# Patient Record
Sex: Female | Born: 1960
Health system: Southern US, Community
[De-identification: ages and names within clinical notes are randomized; demographics above are authoritative.]

## PROBLEM LIST (undated history)

## (undated) DIAGNOSIS — E785 Hyperlipidemia, unspecified: Secondary | ICD-10-CM

## (undated) DIAGNOSIS — E039 Hypothyroidism, unspecified: Secondary | ICD-10-CM

## (undated) DIAGNOSIS — R011 Cardiac murmur, unspecified: Secondary | ICD-10-CM

## (undated) DIAGNOSIS — M810 Age-related osteoporosis without current pathological fracture: Secondary | ICD-10-CM

## (undated) DIAGNOSIS — C50919 Malignant neoplasm of unspecified site of unspecified female breast: Secondary | ICD-10-CM

## (undated) DIAGNOSIS — Z1379 Encounter for other screening for genetic and chromosomal anomalies: Principal | ICD-10-CM

## (undated) DIAGNOSIS — Z923 Personal history of irradiation: Secondary | ICD-10-CM

## (undated) DIAGNOSIS — Z9221 Personal history of antineoplastic chemotherapy: Secondary | ICD-10-CM

## (undated) DIAGNOSIS — E042 Nontoxic multinodular goiter: Secondary | ICD-10-CM

## (undated) DIAGNOSIS — K219 Gastro-esophageal reflux disease without esophagitis: Secondary | ICD-10-CM

## (undated) DIAGNOSIS — Z803 Family history of malignant neoplasm of breast: Secondary | ICD-10-CM

## (undated) DIAGNOSIS — Z9289 Personal history of other medical treatment: Secondary | ICD-10-CM

## (undated) HISTORY — DX: Hypothyroidism, unspecified: E03.9

## (undated) HISTORY — PX: ABDOMINAL HYSTERECTOMY: SHX81

## (undated) HISTORY — DX: Hyperlipidemia, unspecified: E78.5

## (undated) HISTORY — DX: Personal history of other medical treatment: Z92.89

## (undated) HISTORY — DX: Cardiac murmur, unspecified: R01.1

## (undated) HISTORY — DX: Nontoxic multinodular goiter: E04.2

## (undated) HISTORY — DX: Encounter for other screening for genetic and chromosomal anomalies: Z13.79

## (undated) HISTORY — DX: Family history of malignant neoplasm of breast: Z80.3

## (undated) HISTORY — PX: BREAST SURGERY: SHX581

## (undated) HISTORY — PX: TOTAL ABDOMINAL HYSTERECTOMY W/ BILATERAL SALPINGOOPHORECTOMY: SHX83

## (undated) HISTORY — DX: Malignant neoplasm of unspecified site of unspecified female breast: C50.919

---

## 1997-08-10 ENCOUNTER — Other Ambulatory Visit: Admission: RE | Admit: 1997-08-10 | Discharge: 1997-08-10 | Payer: Self-pay | Admitting: Obstetrics and Gynecology

## 1997-11-27 ENCOUNTER — Ambulatory Visit: Admission: RE | Admit: 1997-11-27 | Discharge: 1997-11-27 | Payer: Self-pay | Admitting: Obstetrics and Gynecology

## 1997-11-27 ENCOUNTER — Encounter: Payer: Self-pay | Admitting: Obstetrics and Gynecology

## 1998-09-18 ENCOUNTER — Other Ambulatory Visit: Admission: RE | Admit: 1998-09-18 | Discharge: 1998-09-18 | Payer: Self-pay | Admitting: Obstetrics and Gynecology

## 1999-10-08 ENCOUNTER — Other Ambulatory Visit: Admission: RE | Admit: 1999-10-08 | Discharge: 1999-10-08 | Payer: Self-pay | Admitting: Obstetrics and Gynecology

## 2000-08-17 ENCOUNTER — Emergency Department (HOSPITAL_COMMUNITY): Admission: EM | Admit: 2000-08-17 | Discharge: 2000-08-17 | Payer: Self-pay

## 2001-02-03 HISTORY — PX: BREAST LUMPECTOMY: SHX2

## 2001-05-03 ENCOUNTER — Other Ambulatory Visit: Admission: RE | Admit: 2001-05-03 | Discharge: 2001-05-03 | Payer: Self-pay | Admitting: Radiology

## 2001-05-03 ENCOUNTER — Encounter: Payer: Self-pay | Admitting: Obstetrics and Gynecology

## 2001-05-03 ENCOUNTER — Encounter (INDEPENDENT_AMBULATORY_CARE_PROVIDER_SITE_OTHER): Payer: Self-pay | Admitting: Specialist

## 2001-05-03 ENCOUNTER — Encounter: Admission: RE | Admit: 2001-05-03 | Discharge: 2001-05-03 | Payer: Self-pay | Admitting: Obstetrics and Gynecology

## 2001-05-17 ENCOUNTER — Encounter (INDEPENDENT_AMBULATORY_CARE_PROVIDER_SITE_OTHER): Payer: Self-pay | Admitting: *Deleted

## 2001-05-17 ENCOUNTER — Ambulatory Visit (HOSPITAL_BASED_OUTPATIENT_CLINIC_OR_DEPARTMENT_OTHER): Admission: RE | Admit: 2001-05-17 | Discharge: 2001-05-17 | Payer: Self-pay | Admitting: *Deleted

## 2001-05-17 ENCOUNTER — Encounter: Admission: RE | Admit: 2001-05-17 | Discharge: 2001-05-17 | Payer: Self-pay | Admitting: *Deleted

## 2001-05-22 ENCOUNTER — Emergency Department (HOSPITAL_COMMUNITY): Admission: EM | Admit: 2001-05-22 | Discharge: 2001-05-22 | Payer: Self-pay | Admitting: Emergency Medicine

## 2001-06-01 ENCOUNTER — Ambulatory Visit (HOSPITAL_COMMUNITY): Admission: RE | Admit: 2001-06-01 | Discharge: 2001-06-01 | Payer: Self-pay | Admitting: Oncology

## 2001-06-01 ENCOUNTER — Encounter: Payer: Self-pay | Admitting: Oncology

## 2001-06-04 ENCOUNTER — Ambulatory Visit (HOSPITAL_COMMUNITY): Admission: RE | Admit: 2001-06-04 | Discharge: 2001-06-04 | Payer: Self-pay | Admitting: Oncology

## 2001-06-04 ENCOUNTER — Encounter: Payer: Self-pay | Admitting: Oncology

## 2001-06-08 ENCOUNTER — Ambulatory Visit (HOSPITAL_BASED_OUTPATIENT_CLINIC_OR_DEPARTMENT_OTHER): Admission: RE | Admit: 2001-06-08 | Discharge: 2001-06-08 | Payer: Self-pay | Admitting: *Deleted

## 2001-06-09 ENCOUNTER — Encounter: Admission: RE | Admit: 2001-06-09 | Discharge: 2001-06-09 | Payer: Self-pay | Admitting: *Deleted

## 2001-06-09 ENCOUNTER — Inpatient Hospital Stay (HOSPITAL_COMMUNITY): Admission: EM | Admit: 2001-06-09 | Discharge: 2001-06-11 | Payer: Self-pay | Admitting: Emergency Medicine

## 2001-10-14 ENCOUNTER — Ambulatory Visit: Admission: RE | Admit: 2001-10-14 | Discharge: 2001-12-27 | Payer: Self-pay | Admitting: *Deleted

## 2001-10-28 ENCOUNTER — Encounter: Admission: RE | Admit: 2001-10-28 | Discharge: 2001-10-28 | Payer: Self-pay | Admitting: *Deleted

## 2001-11-15 ENCOUNTER — Ambulatory Visit (HOSPITAL_COMMUNITY): Admission: RE | Admit: 2001-11-15 | Discharge: 2001-11-15 | Payer: Self-pay | Admitting: Neurosurgery

## 2001-11-15 ENCOUNTER — Encounter: Payer: Self-pay | Admitting: Oncology

## 2001-12-01 ENCOUNTER — Other Ambulatory Visit: Admission: RE | Admit: 2001-12-01 | Discharge: 2001-12-01 | Payer: Self-pay | Admitting: Obstetrics and Gynecology

## 2002-01-14 ENCOUNTER — Ambulatory Visit (HOSPITAL_BASED_OUTPATIENT_CLINIC_OR_DEPARTMENT_OTHER): Admission: RE | Admit: 2002-01-14 | Discharge: 2002-01-14 | Payer: Self-pay | Admitting: *Deleted

## 2002-04-06 ENCOUNTER — Encounter: Payer: Self-pay | Admitting: Oncology

## 2002-04-06 ENCOUNTER — Ambulatory Visit (HOSPITAL_COMMUNITY): Admission: RE | Admit: 2002-04-06 | Discharge: 2002-04-06 | Payer: Self-pay | Admitting: Oncology

## 2002-07-13 ENCOUNTER — Encounter: Admission: RE | Admit: 2002-07-13 | Discharge: 2002-07-13 | Payer: Self-pay | Admitting: Oncology

## 2002-07-13 ENCOUNTER — Encounter: Payer: Self-pay | Admitting: Oncology

## 2002-12-12 ENCOUNTER — Other Ambulatory Visit: Admission: RE | Admit: 2002-12-12 | Discharge: 2002-12-12 | Payer: Self-pay | Admitting: Obstetrics and Gynecology

## 2003-01-09 ENCOUNTER — Ambulatory Visit (HOSPITAL_COMMUNITY): Admission: RE | Admit: 2003-01-09 | Discharge: 2003-01-09 | Payer: Self-pay | Admitting: Oncology

## 2003-02-23 ENCOUNTER — Encounter: Admission: RE | Admit: 2003-02-23 | Discharge: 2003-02-23 | Payer: Self-pay | Admitting: *Deleted

## 2003-07-31 ENCOUNTER — Encounter: Admission: RE | Admit: 2003-07-31 | Discharge: 2003-07-31 | Payer: Self-pay | Admitting: Oncology

## 2003-10-18 ENCOUNTER — Ambulatory Visit (HOSPITAL_COMMUNITY): Admission: RE | Admit: 2003-10-18 | Discharge: 2003-10-18 | Payer: Self-pay | Admitting: Oncology

## 2003-11-14 ENCOUNTER — Encounter: Admission: RE | Admit: 2003-11-14 | Discharge: 2003-11-14 | Payer: Self-pay | Admitting: Oncology

## 2003-12-09 ENCOUNTER — Ambulatory Visit: Payer: Self-pay | Admitting: Oncology

## 2004-01-08 ENCOUNTER — Other Ambulatory Visit: Admission: RE | Admit: 2004-01-08 | Discharge: 2004-01-08 | Payer: Self-pay | Admitting: Obstetrics and Gynecology

## 2004-02-12 ENCOUNTER — Observation Stay (HOSPITAL_COMMUNITY): Admission: EM | Admit: 2004-02-12 | Discharge: 2004-02-13 | Payer: Self-pay | Admitting: Obstetrics and Gynecology

## 2004-02-12 ENCOUNTER — Encounter (INDEPENDENT_AMBULATORY_CARE_PROVIDER_SITE_OTHER): Payer: Self-pay | Admitting: Specialist

## 2004-04-02 ENCOUNTER — Ambulatory Visit: Payer: Self-pay | Admitting: Oncology

## 2004-07-30 ENCOUNTER — Ambulatory Visit: Payer: Self-pay | Admitting: Oncology

## 2004-08-01 ENCOUNTER — Encounter: Admission: RE | Admit: 2004-08-01 | Discharge: 2004-08-01 | Payer: Self-pay | Admitting: Oncology

## 2004-08-14 ENCOUNTER — Encounter: Admission: RE | Admit: 2004-08-14 | Discharge: 2004-08-14 | Payer: Self-pay | Admitting: Oncology

## 2004-11-27 ENCOUNTER — Ambulatory Visit: Payer: Self-pay | Admitting: Oncology

## 2005-02-18 ENCOUNTER — Other Ambulatory Visit: Admission: RE | Admit: 2005-02-18 | Discharge: 2005-02-18 | Payer: Self-pay | Admitting: Obstetrics and Gynecology

## 2005-04-21 ENCOUNTER — Emergency Department (HOSPITAL_COMMUNITY): Admission: EM | Admit: 2005-04-21 | Discharge: 2005-04-21 | Payer: Self-pay | Admitting: Emergency Medicine

## 2005-05-27 ENCOUNTER — Ambulatory Visit (HOSPITAL_COMMUNITY): Admission: RE | Admit: 2005-05-27 | Discharge: 2005-05-27 | Payer: Self-pay | Admitting: Oncology

## 2005-05-30 ENCOUNTER — Ambulatory Visit: Payer: Self-pay | Admitting: Oncology

## 2005-06-02 LAB — CBC WITH DIFFERENTIAL/PLATELET
Basophils Absolute: 0 10*3/uL (ref 0.0–0.1)
EOS%: 1.1 % (ref 0.0–7.0)
Eosinophils Absolute: 0.1 10*3/uL (ref 0.0–0.5)
HCT: 43.6 % (ref 34.8–46.6)
HGB: 15 g/dL (ref 11.6–15.9)
MONO#: 0.4 10*3/uL (ref 0.1–0.9)
NEUT#: 3.1 10*3/uL (ref 1.5–6.5)
NEUT%: 61 % (ref 39.6–76.8)
RDW: 13.3 % (ref 11.3–14.5)
WBC: 5.1 10*3/uL (ref 3.9–10.0)
lymph#: 1.5 10*3/uL (ref 0.9–3.3)

## 2005-06-02 LAB — CANCER ANTIGEN 27.29: CA 27.29: 23 U/mL (ref 0–39)

## 2005-06-02 LAB — COMPREHENSIVE METABOLIC PANEL
ALT: 18 U/L (ref 0–40)
AST: 21 U/L (ref 0–37)
Albumin: 5 g/dL (ref 3.5–5.2)
BUN: 15 mg/dL (ref 6–23)
CO2: 27 mEq/L (ref 19–32)
Calcium: 9.9 mg/dL (ref 8.4–10.5)
Chloride: 103 mEq/L (ref 96–112)
Creatinine, Ser: 0.9 mg/dL (ref 0.4–1.2)
Potassium: 4.8 mEq/L (ref 3.5–5.3)

## 2005-06-02 LAB — LACTATE DEHYDROGENASE: LDH: 185 U/L (ref 94–250)

## 2005-06-09 ENCOUNTER — Ambulatory Visit (HOSPITAL_COMMUNITY): Admission: RE | Admit: 2005-06-09 | Discharge: 2005-06-09 | Payer: Self-pay | Admitting: Oncology

## 2005-08-11 ENCOUNTER — Encounter: Admission: RE | Admit: 2005-08-11 | Discharge: 2005-08-11 | Payer: Self-pay | Admitting: Oncology

## 2005-12-02 ENCOUNTER — Ambulatory Visit: Payer: Self-pay | Admitting: Oncology

## 2005-12-04 ENCOUNTER — Encounter: Admission: RE | Admit: 2005-12-04 | Discharge: 2005-12-04 | Payer: Self-pay | Admitting: Oncology

## 2005-12-04 LAB — COMPREHENSIVE METABOLIC PANEL
Alkaline Phosphatase: 72 U/L (ref 39–117)
BUN: 17 mg/dL (ref 6–23)
CO2: 26 mEq/L (ref 19–32)
Creatinine, Ser: 0.84 mg/dL (ref 0.40–1.20)
Glucose, Bld: 96 mg/dL (ref 70–99)
Sodium: 140 mEq/L (ref 135–145)
Total Bilirubin: 0.6 mg/dL (ref 0.3–1.2)
Total Protein: 7.1 g/dL (ref 6.0–8.3)

## 2005-12-04 LAB — CBC WITH DIFFERENTIAL/PLATELET
Basophils Absolute: 0 10*3/uL (ref 0.0–0.1)
Eosinophils Absolute: 0.1 10*3/uL (ref 0.0–0.5)
HCT: 42.2 % (ref 34.8–46.6)
LYMPH%: 36.9 % (ref 14.0–48.0)
MCV: 85.7 fL (ref 81.0–101.0)
MONO#: 0.3 10*3/uL (ref 0.1–0.9)
MONO%: 8.5 % (ref 0.0–13.0)
NEUT#: 2.1 10*3/uL (ref 1.5–6.5)
NEUT%: 52.8 % (ref 39.6–76.8)
Platelets: 273 10*3/uL (ref 145–400)
WBC: 4 10*3/uL (ref 3.9–10.0)

## 2005-12-04 LAB — CANCER ANTIGEN 27.29: CA 27.29: 22 U/mL (ref 0–39)

## 2005-12-04 LAB — LACTATE DEHYDROGENASE: LDH: 178 U/L (ref 94–250)

## 2006-06-23 ENCOUNTER — Ambulatory Visit: Payer: Self-pay | Admitting: Oncology

## 2006-06-23 ENCOUNTER — Ambulatory Visit (HOSPITAL_COMMUNITY): Admission: RE | Admit: 2006-06-23 | Discharge: 2006-06-23 | Payer: Self-pay | Admitting: Oncology

## 2006-06-25 LAB — CBC WITH DIFFERENTIAL/PLATELET
BASO%: 0.5 % (ref 0.0–2.0)
Basophils Absolute: 0 10*3/uL (ref 0.0–0.1)
EOS%: 0.9 % (ref 0.0–7.0)
HGB: 14.7 g/dL (ref 11.6–15.9)
MCH: 30 pg (ref 26.0–34.0)
MCHC: 35.3 g/dL (ref 32.0–36.0)
MCV: 84.9 fL (ref 81.0–101.0)
MONO%: 7 % (ref 0.0–13.0)
RBC: 4.89 10*6/uL (ref 3.70–5.32)
RDW: 13.2 % (ref 11.3–14.5)
lymph#: 1.4 10*3/uL (ref 0.9–3.3)

## 2006-06-25 LAB — COMPREHENSIVE METABOLIC PANEL
ALT: 16 U/L (ref 0–35)
AST: 24 U/L (ref 0–37)
Albumin: 5.2 g/dL (ref 3.5–5.2)
Alkaline Phosphatase: 75 U/L (ref 39–117)
BUN: 21 mg/dL (ref 6–23)
Calcium: 9.6 mg/dL (ref 8.4–10.5)
Chloride: 104 mEq/L (ref 96–112)
Potassium: 4.1 mEq/L (ref 3.5–5.3)
Sodium: 139 mEq/L (ref 135–145)

## 2006-08-14 ENCOUNTER — Encounter: Admission: RE | Admit: 2006-08-14 | Discharge: 2006-08-14 | Payer: Self-pay | Admitting: Oncology

## 2006-08-17 ENCOUNTER — Ambulatory Visit (HOSPITAL_COMMUNITY): Admission: RE | Admit: 2006-08-17 | Discharge: 2006-08-17 | Payer: Self-pay | Admitting: Oncology

## 2006-11-17 ENCOUNTER — Ambulatory Visit: Payer: Self-pay | Admitting: Sports Medicine

## 2006-11-17 DIAGNOSIS — Z853 Personal history of malignant neoplasm of breast: Secondary | ICD-10-CM | POA: Insufficient documentation

## 2006-11-17 DIAGNOSIS — M2170 Unequal limb length (acquired), unspecified site: Secondary | ICD-10-CM

## 2006-11-17 DIAGNOSIS — M2040 Other hammer toe(s) (acquired), unspecified foot: Secondary | ICD-10-CM | POA: Insufficient documentation

## 2006-11-17 DIAGNOSIS — M84369A Stress fracture, unspecified tibia and fibula, initial encounter for fracture: Secondary | ICD-10-CM | POA: Insufficient documentation

## 2006-11-17 DIAGNOSIS — M25579 Pain in unspecified ankle and joints of unspecified foot: Secondary | ICD-10-CM | POA: Insufficient documentation

## 2006-11-17 DIAGNOSIS — E039 Hypothyroidism, unspecified: Secondary | ICD-10-CM | POA: Insufficient documentation

## 2006-11-17 HISTORY — DX: Unequal limb length (acquired), unspecified site: M21.70

## 2006-12-01 ENCOUNTER — Ambulatory Visit: Payer: Self-pay | Admitting: Sports Medicine

## 2006-12-01 DIAGNOSIS — M79609 Pain in unspecified limb: Secondary | ICD-10-CM | POA: Insufficient documentation

## 2007-02-02 ENCOUNTER — Ambulatory Visit: Payer: Self-pay | Admitting: Oncology

## 2007-02-04 DIAGNOSIS — E042 Nontoxic multinodular goiter: Secondary | ICD-10-CM

## 2007-02-04 HISTORY — DX: Nontoxic multinodular goiter: E04.2

## 2007-02-05 LAB — CBC WITH DIFFERENTIAL/PLATELET
BASO%: 0.2 % (ref 0.0–2.0)
Basophils Absolute: 0 10*3/uL (ref 0.0–0.1)
Eosinophils Absolute: 0.1 10*3/uL (ref 0.0–0.5)
HCT: 42.7 % (ref 34.8–46.6)
HGB: 14.9 g/dL (ref 11.6–15.9)
LYMPH%: 38.4 % (ref 14.0–48.0)
MONO#: 0.3 10*3/uL (ref 0.1–0.9)
NEUT#: 3 10*3/uL (ref 1.5–6.5)
NEUT%: 55.4 % (ref 39.6–76.8)
Platelets: 283 10*3/uL (ref 145–400)
WBC: 5.5 10*3/uL (ref 3.9–10.0)
lymph#: 2.1 10*3/uL (ref 0.9–3.3)

## 2007-02-05 LAB — COMPREHENSIVE METABOLIC PANEL
ALT: 22 U/L (ref 0–35)
BUN: 15 mg/dL (ref 6–23)
CO2: 25 mEq/L (ref 19–32)
Calcium: 9.9 mg/dL (ref 8.4–10.5)
Chloride: 104 mEq/L (ref 96–112)
Creatinine, Ser: 1.13 mg/dL (ref 0.40–1.20)
Glucose, Bld: 63 mg/dL — ABNORMAL LOW (ref 70–99)
Total Bilirubin: 0.6 mg/dL (ref 0.3–1.2)

## 2007-02-05 LAB — LACTATE DEHYDROGENASE: LDH: 212 U/L (ref 94–250)

## 2007-02-05 LAB — CANCER ANTIGEN 27.29: CA 27.29: 21 U/mL (ref 0–39)

## 2007-02-09 LAB — VITAMIN D PNL(25-HYDRXY+1,25-DIHY)-BLD: Vit D, 25-Hydroxy: 36 ng/mL (ref 30–89)

## 2007-08-02 ENCOUNTER — Ambulatory Visit: Payer: Self-pay | Admitting: Oncology

## 2007-08-04 LAB — CBC WITH DIFFERENTIAL/PLATELET
BASO%: 0.5 % (ref 0.0–2.0)
EOS%: 1.2 % (ref 0.0–7.0)
LYMPH%: 30.8 % (ref 14.0–48.0)
MCH: 29.6 pg (ref 26.0–34.0)
MCHC: 35.1 g/dL (ref 32.0–36.0)
MONO#: 0.3 10*3/uL (ref 0.1–0.9)
NEUT%: 62.4 % (ref 39.6–76.8)
RBC: 4.85 10*6/uL (ref 3.70–5.32)
WBC: 6.3 10*3/uL (ref 3.9–10.0)
lymph#: 1.9 10*3/uL (ref 0.9–3.3)

## 2007-08-05 LAB — COMPREHENSIVE METABOLIC PANEL
ALT: 21 U/L (ref 0–35)
AST: 27 U/L (ref 0–37)
CO2: 25 mEq/L (ref 19–32)
Chloride: 103 mEq/L (ref 96–112)
Creatinine, Ser: 0.87 mg/dL (ref 0.40–1.20)
Sodium: 140 mEq/L (ref 135–145)
Total Bilirubin: 0.5 mg/dL (ref 0.3–1.2)
Total Protein: 7.4 g/dL (ref 6.0–8.3)

## 2007-08-05 LAB — LACTATE DEHYDROGENASE: LDH: 222 U/L (ref 94–250)

## 2007-08-17 ENCOUNTER — Encounter: Admission: RE | Admit: 2007-08-17 | Discharge: 2007-08-17 | Payer: Self-pay | Admitting: Oncology

## 2008-01-04 ENCOUNTER — Encounter: Admission: RE | Admit: 2008-01-04 | Discharge: 2008-01-04 | Payer: Self-pay | Admitting: Oncology

## 2008-06-14 ENCOUNTER — Ambulatory Visit: Payer: Self-pay | Admitting: Sports Medicine

## 2008-06-14 DIAGNOSIS — M84376A Stress fracture, unspecified foot, initial encounter for fracture: Secondary | ICD-10-CM | POA: Insufficient documentation

## 2008-07-05 ENCOUNTER — Ambulatory Visit: Payer: Self-pay | Admitting: Sports Medicine

## 2008-07-19 ENCOUNTER — Ambulatory Visit: Payer: Self-pay | Admitting: Sports Medicine

## 2008-08-16 ENCOUNTER — Ambulatory Visit: Payer: Self-pay | Admitting: Oncology

## 2008-08-18 LAB — CBC WITH DIFFERENTIAL/PLATELET
BASO%: 0.5 % (ref 0.0–2.0)
EOS%: 1.1 % (ref 0.0–7.0)
Eosinophils Absolute: 0.1 10*3/uL (ref 0.0–0.5)
LYMPH%: 38 % (ref 14.0–49.7)
MCH: 30.2 pg (ref 25.1–34.0)
MCHC: 34.5 g/dL (ref 31.5–36.0)
MCV: 87.3 fL (ref 79.5–101.0)
MONO%: 7.2 % (ref 0.0–14.0)
NEUT#: 2.5 10*3/uL (ref 1.5–6.5)
Platelets: 247 10*3/uL (ref 145–400)
RBC: 4.71 10*6/uL (ref 3.70–5.45)
RDW: 13.6 % (ref 11.2–14.5)

## 2008-08-21 ENCOUNTER — Encounter: Admission: RE | Admit: 2008-08-21 | Discharge: 2008-08-21 | Payer: Self-pay | Admitting: Oncology

## 2008-08-21 LAB — COMPREHENSIVE METABOLIC PANEL
ALT: 25 U/L (ref 0–35)
AST: 24 U/L (ref 0–37)
Albumin: 4.9 g/dL (ref 3.5–5.2)
Alkaline Phosphatase: 54 U/L (ref 39–117)
Potassium: 4 mEq/L (ref 3.5–5.3)
Sodium: 141 mEq/L (ref 135–145)
Total Bilirubin: 0.5 mg/dL (ref 0.3–1.2)
Total Protein: 7 g/dL (ref 6.0–8.3)

## 2008-08-21 LAB — CANCER ANTIGEN 27.29: CA 27.29: 22 U/mL (ref 0–39)

## 2009-08-20 ENCOUNTER — Ambulatory Visit: Payer: Self-pay | Admitting: Oncology

## 2009-08-20 LAB — CBC WITH DIFFERENTIAL/PLATELET
BASO%: 0.3 % (ref 0.0–2.0)
Basophils Absolute: 0 10*3/uL (ref 0.0–0.1)
EOS%: 0.9 % (ref 0.0–7.0)
Eosinophils Absolute: 0 10*3/uL (ref 0.0–0.5)
HCT: 42.6 % (ref 34.8–46.6)
HGB: 14.7 g/dL (ref 11.6–15.9)
LYMPH%: 29.8 % (ref 14.0–49.7)
MCH: 30.3 pg (ref 25.1–34.0)
MCHC: 34.5 g/dL (ref 31.5–36.0)
MCV: 87.7 fL (ref 79.5–101.0)
MONO#: 0.4 10*3/uL (ref 0.1–0.9)
MONO%: 6.5 % (ref 0.0–14.0)
NEUT#: 3.5 10*3/uL (ref 1.5–6.5)
NEUT%: 62.5 % (ref 38.4–76.8)
Platelets: 253 10*3/uL (ref 145–400)
RBC: 4.86 10*6/uL (ref 3.70–5.45)
RDW: 13.4 % (ref 11.2–14.5)
WBC: 5.7 10*3/uL (ref 3.9–10.3)
lymph#: 1.7 10*3/uL (ref 0.9–3.3)

## 2009-08-21 LAB — COMPREHENSIVE METABOLIC PANEL
ALT: 19 U/L (ref 0–35)
AST: 29 U/L (ref 0–37)
Albumin: 5.2 g/dL (ref 3.5–5.2)
Alkaline Phosphatase: 51 U/L (ref 39–117)
BUN: 16 mg/dL (ref 6–23)
CO2: 26 mEq/L (ref 19–32)
Calcium: 9.8 mg/dL (ref 8.4–10.5)
Chloride: 103 mEq/L (ref 96–112)
Creatinine, Ser: 0.87 mg/dL (ref 0.40–1.20)
Glucose, Bld: 81 mg/dL (ref 70–99)
Potassium: 3.8 mEq/L (ref 3.5–5.3)
Sodium: 142 mEq/L (ref 135–145)
Total Bilirubin: 0.6 mg/dL (ref 0.3–1.2)
Total Protein: 7.5 g/dL (ref 6.0–8.3)

## 2009-08-21 LAB — CANCER ANTIGEN 27.29: CA 27.29: 22 U/mL (ref 0–39)

## 2009-08-21 LAB — VITAMIN D 25 HYDROXY (VIT D DEFICIENCY, FRACTURES): Vit D, 25-Hydroxy: 44 ng/mL (ref 30–89)

## 2009-08-21 LAB — LACTATE DEHYDROGENASE: LDH: 197 U/L (ref 94–250)

## 2009-08-24 ENCOUNTER — Encounter: Admission: RE | Admit: 2009-08-24 | Discharge: 2009-08-24 | Payer: Self-pay | Admitting: Oncology

## 2009-09-25 ENCOUNTER — Ambulatory Visit: Payer: Self-pay | Admitting: Sports Medicine

## 2009-09-25 DIAGNOSIS — M25559 Pain in unspecified hip: Secondary | ICD-10-CM | POA: Insufficient documentation

## 2009-09-25 DIAGNOSIS — R42 Dizziness and giddiness: Secondary | ICD-10-CM | POA: Insufficient documentation

## 2009-09-25 DIAGNOSIS — R0789 Other chest pain: Secondary | ICD-10-CM | POA: Insufficient documentation

## 2010-01-07 ENCOUNTER — Encounter: Admission: RE | Admit: 2010-01-07 | Discharge: 2010-01-07 | Payer: Self-pay | Admitting: Oncology

## 2010-02-23 ENCOUNTER — Other Ambulatory Visit: Payer: Self-pay | Admitting: Oncology

## 2010-02-23 DIAGNOSIS — Z9889 Other specified postprocedural states: Secondary | ICD-10-CM

## 2010-02-24 ENCOUNTER — Encounter: Payer: Self-pay | Admitting: Oncology

## 2010-03-05 NOTE — Assessment & Plan Note (Signed)
Summary: R HIP PAIN,MC   Vital Signs:  Patient profile:   50 year old female Height:      64 inches Weight:      140 pounds BMI:     24.12 O2 Sat:      99 % on Room air BP sitting:   146 / 98  Vitals Entered By: Lillia Pauls CMA (September 25, 2009 11:46 AM)  O2 Flow:  Room air  CC:  hip pain.  History of Present Illness: 50 yo F with:  1. Right Hip Pain: patient runs 3 miles 3 days/week. she ran a race last Sat and was fine until the race went off-road. she felt pain in hip but finished. after driving home, it was very painful for her to get out of the car. she has been limping. pain worse with going up steps (hip flexion). she can pinpoint tender spot. she endorses Hx of tip hips.   2. Chest Tightness: today, with trouble feeling like she can take a deep breath, midsternal, with no radiation, non-exertional, associated with nausea, dizziness. no pleuritic pain, heartburn, LE edema or evidence of DVT. patient with PMHx breast cancer - chemo and radiation s/p mastectomy. + FamHx of mother with MI at age 72. has yearly physicals and blood work - no HLD, DM, HTN - though BP high today. + perimenopausal and has been experiencing vasomotor sxs.   Allergies (verified): No Known Drug Allergies PMH-FH-SH reviewed for relevance  Review of Systems General:  Denies chills and fever. CV:  Complains of chest pain or discomfort and lightheadness; denies shortness of breath with exertion and swelling of feet. Resp:  Complains of shortness of breath; denies cough, pleuritic, and wheezing. GI:  Complains of nausea; denies abdominal pain, change in bowel habits, and vomiting. MS:  Complains of joint pain and muscle aches; denies joint redness, joint swelling, loss of strength, and low back pain. Neuro:  Denies numbness and tingling. Psych:  Denies anxiety, panic attacks, and sense of great danger.  Physical Exam  General:  Well-developed, well-nourished, in no acute distress; alert, appropriate  and cooperative throughout examination. Vitals noted. Lungs:  Normal respiratory effort, chest expands symmetrically. Lungs are clear to auscultation, no crackles or wheezes. Heart:  Normal rate and regular rhythm. S1 and S2 normal without gallop, murmur, click, rub or other extra sounds. Msk:  Right hip with point tenderness just posterior to greater trochanter. Normal ROM. Normal LE strength. Normal leg lengths.  Pulses:  2 + DP Extremities:  No edema. Neurologic:  Strength normal in all extremities, sensation intact to light touch, gait normal, and DTRs symmetrical and normal.   Psych:  Oriented X3, memory intact for recent and remote, normally interactive, good eye contact, not anxious appearing, and not depressed appearing.     Impression & Recommendations:  Problem # 1:  HIP PAIN, RIGHT (ICD-719.45) Assessment New Likely irritation of tensor fasciae latae near greater trochanter from off-round run (hills and turns). Rec NSAIDs, ice, PT - hip flexion/abduction and stretches. Patient to follow up in 2-4 weeks if not improving.   Problem # 2:  CHEST PAIN, ATYPICAL (ICD-786.59) Assessment: New EKG NSR with no ST/T changes. VS WNL with exception of slightly high BP. No RED FLAGs for PE. CP not reproducable on exam. Likely 2/2 to vasomotor sxs versus anxiety. Discussed Red Flags and recommended f/u with PCP.   Problem # 3:  DIZZINESS (ICD-780.4)  Orders: EKG Regular (16109)   seems likely vasomotor - perimenopausal sx

## 2010-05-30 ENCOUNTER — Encounter: Payer: Self-pay | Admitting: Sports Medicine

## 2010-05-30 ENCOUNTER — Ambulatory Visit (INDEPENDENT_AMBULATORY_CARE_PROVIDER_SITE_OTHER): Payer: BC Managed Care – PPO | Admitting: Sports Medicine

## 2010-05-30 DIAGNOSIS — M216X9 Other acquired deformities of unspecified foot: Secondary | ICD-10-CM

## 2010-05-30 DIAGNOSIS — M84376A Stress fracture, unspecified foot, initial encounter for fracture: Secondary | ICD-10-CM

## 2010-05-30 NOTE — Assessment & Plan Note (Signed)
Additional padding over the heel on each lymphatic was added today. At completion of this she was able to run in the hall without any significant pain. Her running form shows forefoot strike and a heel slap consistent with her cavus foot. There is no active limping.  She is to use well-padded orthotics for the next one to 2 years unless the feet become painful while using these. She should return when necessary if that happens

## 2010-05-30 NOTE — Progress Notes (Signed)
  Subjective:    Patient ID: Olivia [name], female    DOB: 1960/07/11, 50 y.o.   MRN: [id number]  HPI Patient returns for evaluation of some right heel pain. She did run a marathon in January and the pain started after that. She is still running 12 miles per week without any intense pain and does not have any morning pain. She originally had this type of pain in 2008 which we thought was related to her cavus feet so we put her in orthotics. All her pain resolved with the use of the orthotics and she has not had any return until having this marathon.  Other injuries include metatarsal stress fracture. Metatarsal pads added to her orthotics have kept her from getting any recurrent pain over the metatarsals.  The orthotics are almost 49 years old they have felt comfortable until recently.   Review of Systems     Objective:   Physical Exam    patient is in no acute distress  Examination of the feet bilaterally reveals a cavus foot Right heel has no tenderness to palpation no swelling and no tenderness over the Achilles tendon. Plantar fascia is nontender and the great toe moves well Left he'll has no tenderness over the calcaneus or the Achilles tendon. The plantar fascia is non-tender and the great toe moves well.  Further foot reveals metatarsal arch collapse bilaterally but the callusing is less in the feet are nontender.    Assessment & Plan:

## 2010-05-30 NOTE — Assessment & Plan Note (Signed)
These have resolved but with her having more foot pain we replaced the metatarsal pads on her orthotics today.

## 2010-06-21 NOTE — H&P (Signed)
Olivia [name], Olivia [name]                ACCOUNT NO.:  [id number]   MEDICAL RECORD NO.:  [id number]          PATIENT TYPE:  INP   LOCATION:  NA                            FACILITY:  [facility]   PHYSICIAN:  Duke Salvia. Marcelle Overlie, M.D.DATE OF BIRTH:  18-Jun-1960   DATE OF ADMISSION:  02/12/2004  DATE OF DISCHARGE:                                HISTORY & PHYSICAL   CHIEF COMPLAINT:  For LSH/BSO to eliminate estrogen, post breast cancer  treatment.   HISTORY OF PRESENT ILLNESS:  [age]year-old G0, P0.  Her hematology-  oncology doctor is Dr. Pierce Crane, who had recommended BSO to eliminate her  estrogen internally to improve her post-breast-cancer treatment.  She is  currently on tamoxifen, was diagnosed with breast cancer in 2003 and went  through biopsy and further chemotherapy.  Prior to that time, her husband  had had a vasectomy; she had never planned on getting pregnant.  The  procedure of LSH including risks of bleeding, infection, adjacent organ  injury, the possible need for open or additional surgery, the need for  yearly Pap smears discussed, the possibility of open TAH/BSO reviewed, along  with her expected recovery time all discussed, which she understands and  accepts.   Last mammogram, June 2005, showed no evidence or recurrence.   ALLERGIES:  None.  No other known drug allergies.   PAST MEDICAL HISTORY:   OPERATIONS:  Breast biopsy.   OTHER ILLNESSES:  Treatment for breast cancer.   REVIEW OF SYSTEMS:  Review of systems otherwise unremarkable. At one point,  she was on thyroid supplementation, which has since been discontinued.   FAMILY HISTORY:  Family history is significant for father with prostate  cancer and died of complications related to diabetes.  Mother is alive and  well.  She has 4 siblings, 2 brothers and 2 sisters, in good health.   PHYSICAL EXAMINATION:  VITAL SIGNS:  Temperature 98.2, blood pressure  118/72.  HEENT:  Unremarkable.  NECK:  Neck supple  without masses.  LUNGS:  Clear.  CARDIOVASCULAR:  Regular rate and rhythm without murmurs, rubs or gallops  noted.  BREASTS:  Breasts reveal no current masses.  ABDOMEN:  Abdomen soft, flat and nontender.  PELVIC:  Normal external genitalia.  Vagina and cervix clear.  Uterus  midpositional, normal size.  Adnexa negative.  Last Pap was just done this  week, which was normal.  EXTREMITIES AND NEUROLOGIC:  Exam unremarkable.   IMPRESSION:  1.  History of breast cancer.  2.  Recommendation by her oncologist to eliminate her inherent estrogen      status.   PLAN:  LSH/BSO.  Procedure and risks reviewed, as above.     Rich   RMH/MEDQ  D:  01/08/2004  T:  01/08/2004  Job:  [id number]

## 2010-06-21 NOTE — Discharge Summary (Signed)
[name], Olivia                ACCOUNT NO.:  [id number]   MEDICAL RECORD NO.:  [id number]          PATIENT TYPE:  INP   LOCATION:  NA                            [facility]ACILITY[facility]  [facility]   PHYSICIAN:  Duke Salvia. Marcelle Overlie, M.D.DATE OF BIRTH:  Nov 03, 1960   DATE OF ADMISSION:  02/12/2004  DATE OF DISCHARGE:  02/13/2004                                 DISCHARGE SUMMARY   DISCHARGE DIAGNOSES:  1.  History of estrogen receptor positive breast cancer.  2.  LSH bilateral salpingo-oophorectomy this admission.   HISTORY:  Please see admission H&P for details.  Briefly, a 50 year old, GOP  whose husband has had a vasectomy has been treated by Dr. Donnie Coffin for estrogen  positive breast cancer, he had discussed and recommended oophorectomy and  she presents now for [facility] BSO.   HOSPITAL COURSE:  On January 9 under general anesthesia, the patient  underwent LSH/BSO, EBL was 150 mL.  The following a.m. hemoglobin 12.6, her  abdominal exam was unremarkable. She was ambulating and tolerating a regular  diet and voiding without difficulty, afebrile and ready for discharge at  that point.   LABORATORY DATA:  Preop hemoglobin was 15.2, postop was 12.6, UPT was  negative, urine unremarkable on admission. The pathology report is still  pending.   DISPOSITION:  The patient was discharged on Tylox p.r.n. pain and will  return to the office in one week.  Advise or report any persistent nausea or  vomiting, fever over 101, increased vaginal bleeding or worsening abdominal  pain. She was given specific instructions regarding diet, sex and exercise.   CONDITION:  Good.   ACTIVITY:  Gradually increase.     Olivia [name]   RMH/MEDQ  D:  02/13/2004  T:  02/13/2004  Job:  [id number]

## 2010-06-21 NOTE — H&P (Signed)
NAMETWYLLA, Olivia [name] NO.:  [id number]   MEDICAL RECORD NO.:  [id number]          PATIENT TYPE:  INP   LOCATION:  NA                            [facility]   PHYSICIAN:  Duke Salvia. Marcelle Overlie, M.D.DATE OF BIRTH:  February 06, 1960   DATE OF ADMISSION:  02/12/2004  DATE OF DISCHARGE:                                HISTORY & PHYSICAL   CHIEF COMPLAINT:  Estrogen positive breast cancer.   HISTORY OF PRESENT ILLNESS:  A 50 year old, G0, P0, her husband has had a  vasectomy.  She is a patient of Dr. Pierce Crane who has treated her for  estrogen positive breast cancer and recently recommended elimination of her  indigenous estrogen to further treat her breast cancer.  We had discussed  LSH/BSO in detail including risk relative to bleeding, infection,  transfusion, adjacent organ injury, possible need for open or additional  surgery.  She understands that she may further have problems with vasomotor  symptoms postoperatively.  She does not have a history of dysplasia in the  past.  Her last Pap smear was January 08, 2004 which was normal.   PAST MEDICAL HISTORY:  Allergies none.   PAST SURGICAL HISTORY:  Lumpectomy.   REVIEW OF SYMPTOMS:  Significant for treatment for breast cancer.   FAMILY HISTORY:  Significant for maternal aunt with breast cancer. Father  had prostate cancer and diabetes. Two brothers and two sisters who are in  good health.   PHYSICAL EXAMINATION:  VITAL SIGNS:  Temperature 98.2, blood pressure  120/62.  HEENT:  Unremarkable.  NECK:  Supple without masses.  LUNGS:  Clear.  BREASTS:  Without masses. No adenopathy palpable.  CARDIOVASCULAR:  Regular rate and rhythm without murmurs, rubs or gallops.  ABDOMEN:  Soft, flat and nontender. There is no inguinal adenopathy. Vulva,  vagina and cervix was normal.  Uterus mid position, normal size. Adnexa  negative.  EXTREMITIES/NEUROLOGIC:  Unremarkable.   IMPRESSION:  History of estrogen positive breast  cancer.   PLAN:  LSH/BSO.  Procedure and risks reviewed as above.     Rich   RMH/MEDQ  D:  02/09/2004  T:  02/09/2004  Job:  [id number]

## 2010-06-21 NOTE — Op Note (Signed)
[place]. [facility]  Patient:    Olivia [name], Olivia [name] Visit Number: [id number] MRN: [id number]          Service Type: MED Location: [id number] Attending Physician:  Vikki Ports. Dictated by:   Earna Coder, M.D. Proc. Date: 05/17/01 Admit Date:  06/09/2001 Discharge Date: 06/11/2001                             Operative Report  PREOPERATIVE DIAGNOSIS:  Right breast cancer.  POSTOPERATIVE DIAGNOSIS:  Right breast cancer.  OPERATION PERFORMED:  Right partial mastectomy.  Right sentinel lymph node biopsy, blue dye injection with lymphatic mapping and right axillary lymph node dissection.  SURGEON:  Stephenie Acres, M.D.  ANESTHESIA:  General.  DESCRIPTION OF PROCEDURE:  The patient was taken to the operating room and placed in supine position.  After adequate anesthesia was induced using endotracheal tube the right breast and axilla were prepped and draped in normal sterile fashion.  3 cc of Lymphazurin blue dye were injected into the retroareolar portion of the right breast.  I then waited 5 minutes.  A curvilinear incision was made in the upper outer quadrant of the right breast over the palpable mass.  I dissected down to the pectoralis muscle.  The pectoralis major muscle was retracted medially.  The mass which was truly within the tail of Spence even lateral to the pectoralis muscle was excised. Using the Neoprobe for guidance, I could not definitively isolate a sentinel lymph node; however, there was a blue node densely adhered to the primary tumor.  This was sent for pathologic evaluation.  That lymph node was positive for tumor on Touch Prep and because of the placement of the incision, a second incision was not made.  It was just extended a little farther posteriorly. Axillary fat pad was identified.  Axillary vein was identified.  Small venous tributaries were clipped and divided.  Special care was taken to preserve  the long thoracic and thoracodorsal nerves.  All level 1 lymph nodes below the subclavian vein were then excised. Small lymphatics were clipped.  The axillary contents were sent en bloc for pathologic evaluation.  A 19 Blake drain was placed through a separate stab wound and up into the axilla.  The skin was closed with a subcutaneous 3-0 Vicryl and a subcuticular 4-0 Monocryl.  Steri-Strips and sterile dressings were applied.  The patient tolerated the procedure well and went to PACU in good condition. Dictated by:   Earna Coder, M.D. Attending Physician:  Danna Hefty R. DD:  06/22/01 TD:  06/23/01 Job: [id number]

## 2010-06-21 NOTE — Op Note (Signed)
[place]. [facility]  Patient:    Olivia [name], Olivia [name] Visit Number: [id number] MRN: [id number]          Service Type: MED Location: [id number] Attending Physician:  Doug Sou Dictated by:   Earna Coder, M.D. Proc. Date: 06/08/01 Admit Date:  06/09/2001                             Operative Report  PREOPERATIVE DIAGNOSIS:  Locally metastatic right breast cancer.  POSTOPERATIVE DIAGNOSIS:  Locally metastatic right breast cancer.  OPERATION PERFORMED:  Placement of left subclavian single lumen Port-A-Cath.  SURGEON:  Stephenie Acres, M.D.  ANESTHESIA:  Local MAC.  DESCRIPTION OF PROCEDURE:  The patient was taken to the operating room and placed in supine position.  After adequate MAC anesthesia was induced, the left chest and neck were prepped and draped in normal sterile fashion.  Using 1%  lidocaine local anesthesia, the skin and subcutaneous tissues and periosteum of the left clavicle was anesthetized.  The left subclavian venipuncture was performed and guide wire was placed using Seldinger technique.  The tip was verified to be in the right atrium.  A separate area in the left chest was then anesthetized just lateral to the sternum.  A transverse incision was made, pocket was created and a single lumen preattached 9.6 [place] Port-A-Cath was placed within the pocket, tunneled and brought out next to the guide wire.  Dilator and sheath were then placed over the guide wire and dilator and guide wire were removed.  The tip of the Port-A-Cath was then inserted through the sheath and the sheath was removed. The Port-A-Cath aspirated a minimal amount of blood and then was flushed with dilute heparinized saline.  The incisions were closed in two layers using running 3-0 subcutaneous Vicryl and subcuticular 4-0 Monocryl.  At this point I attempted to inject 5 cc of concentrated heparin; however, when I attempted to aspirate from the  Port-A-Cath I got no blood return.  Therefore, I tried a gentle injection of the heparinized solution and saw some swelling up near the small incision under the left clavicle.  That small incision was opened up again and the rounded end of the Port-A-Cath was inspected and appeared to have a defect in the lateral wall of the Port-A-Cath.  The distal end was clamped and the Port-A-Cath was removed.  The identical procedure was performed again, also with one single venipuncture of the left subclavian vein.  A Port-A-Cath was then again placed within the pocket, tacked to the pectoralis fascia, aspirated and flushed easily with heparinized saline.  Sterile dressing was applied.  The patient was taken to PACU where she awaits chest x-ray. Dictated by:   Earna Coder, M.D. Attending Physician:  Doug Sou DD:  06/08/01 TD:  06/09/01 Job: [id number]

## 2010-06-21 NOTE — Op Note (Signed)
   NAME:  Olivia [name], Olivia [name]                          ACCOUNT NO.:  [id number]   MEDICAL RECORD NO.:  [id number]                   PATIENT TYPE:  AMB   LOCATION:  DSC                                  FACILITY:  [facility]   PHYSICIAN:  Vikki Ports, M.D.         DATE OF BIRTH:  04-11-1960   DATE OF PROCEDURE:  01/14/2002  DATE OF DISCHARGE:  01/14/2002                                 OPERATIVE REPORT   PREOPERATIVE DIAGNOSIS:  Right breast cancer.   POSTOPERATIVE DIAGNOSIS:  Right breast cancer.   OPERATION:  Removal of Port-A-Cath.   SURGEON:  Vikki Ports, M.D.   ANESTHESIA:  Local MAC   DESCRIPTION OF PROCEDURE:  The patient was taken to the operating room and  placed in the supine position.  After adequate MAC anesthesia was induced,  left chest was prepped and draped in the usual sterile fashion.  Using 1%  Lidocaine local anesthesia, the skin overlying the mass was anesthetized.  A  transverse incision was made through the previous scar.  Down into the  pocket containing the Port-A-Cath which was opened using sharp dissection.  The 2-0 Prolene sutures were identified, cut and removed.  The Port-A-Cath  was then removed.  Pressure was held on the infraclavicular site for two to  three minutes.  The cavity was inspected.  Adequate hemostasis was assured  and the skin was closed with a subcuticular 4-0 Monocryl.  Steri-Strips and  sterile dressing was applied.  The patient tolerated the procedure well and  went to PACU in good condition.                                               Vikki Ports, M.D.    KRH/MEDQ  D:  01/17/2002  T:  01/17/2002  Job:  [id number]

## 2010-06-21 NOTE — Op Note (Signed)
NAMEVEVERLY, LARIMER NO.:  [id number]   MEDICAL RECORD NO.:  [id number]          PATIENT TYPE:  INP   LOCATION:  9399                          [facility]   PHYSICIAN:  Duke Salvia. Marcelle Overlie, M.D.DATE OF BIRTH:  06-Aug-1960   DATE OF PROCEDURE:  02/12/2004  DATE OF DISCHARGE:                                 OPERATIVE REPORT   PREOPERATIVE DIAGNOSIS:  Estrogen-positive breast cancer.   POSTOPERATIVE DIAGNOSIS:  Estrogen-positive breast cancer.   PROCEDURES:  1.  Laparoscopic supracervical hysterectomy.  2.  Bilateral salpingo-oophorectomy.   SURGEON:  Duke Salvia. Marcelle Overlie, M.D.   ASSISTANT:  Juluis Mire, M.D.   ANESTHESIA:  General endotracheal.   COMPLICATIONS:  None.   DRAINS:  Foley catheter.   BLOOD LOSS:  150 mL.   PROCEDURE FINDINGS:  The patient was taken to the operating room.  After an  adequate level of general endotracheal anesthesia was obtained with the  patient's legs in stirrups, the abdomen, perineum and vagina were prepped  and draped in the usual manner for laparoscopy.  The bladder was drained.  EUA carried out.  The uterus was normal size and mid position.  Adnexa  negative.  A tenaculum was used to grasp the cervix.  The sound was  positioned at the fundus and taped to the tenaculum for manipulation and  identification.  Attention directed to the abdomen where a 2 cm subumbilical  incision was made after infiltrating with 0.5% Marcaine plain.  The Veress  needle was introduced without difficulty.  Its intra-abdominal position was  verified by pressure and water testing.  After a 2.5 L pneumothorax was  created, a laparoscopic trocar and sleeve were then introduced without  difficulty.  The patient was placed in Trendelenburg.  The pelvic findings  revealed normal anatomy.  A left lower quadrant incision was made after  negative transillumination and a 10 mm bladeless trocar was inserted, the  same on the opposite site.  After  these were in position, the right tube and  ovary were grasped and tented toward the midline.  The course of the ureter  was noted to be well below.  The gyrus PK instrument was then used to  coagulate and cut the right infundibular pelvic ligament down to and  including the round ligament, the same on the opposite side after ensuring  that the course of the ureter was well below.  Through the lower incision,  the anterior peritoneum was then divided with the harmonic scalpel and the  bladder was advanced a short way with sharp and blunt dissection.  With the  uterus on traction, the ascending branch of the uterine artery was then  coagulated and cut on each side with the gyrus PK instrument.  Once this was  completed, the fundus of the uterus was excised with the harmonic scalpel.  The sound was then gently retracted to identify the endocervical canal which  was then coagulated with the gyrus PK on coagulation mode.  The morcellator  was then introduced into the left lower incision and tubes and ovaries were  morcellated  separately and sent to pathology.  The remainder of the uterus  was morcellated without difficulty.  The pelvis was irrigated with saline  and aspirated.  No tissue fragments were noted.  The operative site was  hemostatic.  _Interceed_________ was placed across the cervical stump.  The  instruments were removed.  Gas was allowed to escape.  Defects closed with 4-  0 Dexon subcuticular sutures.  The fascia in the left lower quadrant  incision was closed separately with a 2-0 Vicryl suture.  Dermabond used on  the skin incisions also.  She tolerated this well and went to recovery in  good condition.     Rich   RMH/MEDQ  D:  02/12/2004  T:  02/12/2004  Job:  [id number]

## 2010-08-20 ENCOUNTER — Other Ambulatory Visit: Payer: Self-pay | Admitting: Oncology

## 2010-08-20 ENCOUNTER — Encounter (HOSPITAL_BASED_OUTPATIENT_CLINIC_OR_DEPARTMENT_OTHER): Payer: BC Managed Care – PPO | Admitting: Oncology

## 2010-08-20 DIAGNOSIS — C50419 Malignant neoplasm of upper-outer quadrant of unspecified female breast: Secondary | ICD-10-CM

## 2010-08-20 LAB — CBC WITH DIFFERENTIAL/PLATELET
Eosinophils Absolute: 0 10*3/uL (ref 0.0–0.5)
HCT: 39.4 % (ref 34.8–46.6)
LYMPH%: 37.6 % (ref 14.0–49.7)
MCHC: 34.7 g/dL (ref 31.5–36.0)
MCV: 87.2 fL (ref 79.5–101.0)
MONO#: 0.3 10*3/uL (ref 0.1–0.9)
MONO%: 7.2 % (ref 0.0–14.0)
NEUT#: 2.4 10*3/uL (ref 1.5–6.5)
NEUT%: 53.8 % (ref 38.4–76.8)
Platelets: 226 10*3/uL (ref 145–400)
RBC: 4.52 10*6/uL (ref 3.70–5.45)

## 2010-08-20 LAB — COMPREHENSIVE METABOLIC PANEL
ALT: 18 U/L (ref 0–35)
AST: 24 U/L (ref 0–37)
Alkaline Phosphatase: 51 U/L (ref 39–117)
Creatinine, Ser: 0.88 mg/dL (ref 0.50–1.10)
Sodium: 138 mEq/L (ref 135–145)
Total Bilirubin: 0.5 mg/dL (ref 0.3–1.2)
Total Protein: 7.1 g/dL (ref 6.0–8.3)

## 2010-08-20 LAB — LACTATE DEHYDROGENASE: LDH: 173 U/L (ref 94–250)

## 2010-08-27 ENCOUNTER — Ambulatory Visit
Admission: RE | Admit: 2010-08-27 | Discharge: 2010-08-27 | Disposition: A | Discharge status: Home / Self Care | Payer: BC Managed Care – PPO | Source: Ambulatory Visit | Attending: Oncology | Admitting: Oncology

## 2010-08-27 ENCOUNTER — Encounter (HOSPITAL_BASED_OUTPATIENT_CLINIC_OR_DEPARTMENT_OTHER): Payer: BC Managed Care – PPO | Admitting: Oncology

## 2010-08-27 DIAGNOSIS — Z9889 Other specified postprocedural states: Secondary | ICD-10-CM

## 2010-08-27 DIAGNOSIS — C50419 Malignant neoplasm of upper-outer quadrant of unspecified female breast: Secondary | ICD-10-CM

## 2010-10-03 ENCOUNTER — Ambulatory Visit: Payer: BC Managed Care – PPO | Admitting: Family Medicine

## 2010-11-06 ENCOUNTER — Encounter: Payer: Self-pay | Admitting: Family Medicine

## 2010-11-07 ENCOUNTER — Ambulatory Visit (INDEPENDENT_AMBULATORY_CARE_PROVIDER_SITE_OTHER): Payer: BC Managed Care – PPO | Admitting: Family Medicine

## 2010-11-07 ENCOUNTER — Encounter: Payer: Self-pay | Admitting: Family Medicine

## 2010-11-07 DIAGNOSIS — E039 Hypothyroidism, unspecified: Secondary | ICD-10-CM

## 2010-11-07 DIAGNOSIS — Z0000 Encounter for general adult medical examination without abnormal findings: Secondary | ICD-10-CM

## 2010-11-07 LAB — HEPATIC FUNCTION PANEL
AST: 25 U/L (ref 0–37)
Albumin: 5.1 g/dL (ref 3.5–5.2)

## 2010-11-07 LAB — CBC WITH DIFFERENTIAL/PLATELET
Basophils Absolute: 0 10*3/uL (ref 0.0–0.1)
HCT: 43.3 % (ref 36.0–46.0)
Lymphs Abs: 1.4 10*3/uL (ref 0.7–4.0)
Monocytes Absolute: 0.3 10*3/uL (ref 0.1–1.0)
Monocytes Relative: 8.1 % (ref 3.0–12.0)
Neutrophils Relative %: 57.3 % (ref 43.0–77.0)
Platelets: 236 10*3/uL (ref 150.0–400.0)
RDW: 13.6 % (ref 11.5–14.6)

## 2010-11-07 LAB — BASIC METABOLIC PANEL
BUN: 19 mg/dL (ref 6–23)
Calcium: 9.6 mg/dL (ref 8.4–10.5)
Creatinine, Ser: 0.8 mg/dL (ref 0.4–1.2)
GFR: 76.48 mL/min (ref >60.00)
Glucose, Bld: 100 mg/dL — ABNORMAL HIGH (ref 70–99)

## 2010-11-07 LAB — TSH: TSH: 1.97 u[IU]/mL (ref 0.35–5.50)

## 2010-11-07 LAB — LIPID PANEL
HDL: 64.7 mg/dL (ref >39.00)
Triglycerides: 109 mg/dL (ref 0.0–149.0)

## 2010-11-07 NOTE — Assessment & Plan Note (Signed)
Pt's PE WNL w/ exception of thyromegaly- known to pt.  UTD on GYN screening.  Anticipatory guidance provided.

## 2010-11-07 NOTE — Progress Notes (Signed)
  Subjective:    Patient ID: Olivia [name], female    DOB: 10-Nov-1960, 50 y.o.   MRN: [id number]  HPI New to establish.  Previous MD- Larina Bras  GYN- Marcelle Overlie, UTD on pap and mammo  Desires CPE, no concerns today  Review of Systems Patient reports no vision/ hearing changes, adenopathy,fever, weight change,  persistant/recurrent hoarseness , swallowing issues, chest pain, palpitations, edema, persistant/recurrent cough, hemoptysis, dyspnea (rest/exertional/paroxysmal nocturnal), gastrointestinal bleeding (melena, rectal bleeding), abdominal pain, significant heartburn, bowel changes, GU symptoms (dysuria, hematuria, incontinence), Gyn symptoms (abnormal  bleeding, pain),  syncope, focal weakness, memory loss, numbness & tingling, skin/hair/nail changes, abnormal bruising or bleeding, anxiety, or depression.     Objective:   Physical Exam  General Appearance:    Alert, cooperative, no distress, appears stated age  Head:    Normocephalic, without obvious abnormality, atraumatic  Eyes:    PERRL, conjunctiva/corneas clear, EOM's intact, fundi    benign, both eyes  Ears:    Normal TM's and external ear canals, both ears  Nose:   Nares normal, septum midline, mucosa normal, no drainage    or sinus tenderness  Throat:   Lips, mucosa, and tongue normal; teeth and gums normal  Neck:   Supple, symmetrical, trachea midline, no adenopathy;    Thyroid: R thyromegaly  Back:     Symmetric, no curvature, ROM normal, no CVA tenderness  Lungs:     Clear to auscultation bilaterally, respirations unlabored  Chest Wall:    No tenderness or deformity   Heart:    Regular rate and rhythm, S1 and S2 normal, no murmur, rub   or gallop  Breast Exam:    Deferred to GYN  Abdomen:     Soft, non-tender, bowel sounds active all four quadrants,    no masses, no organomegaly  Genitalia:    Deferred to GYN  Rectal:    Extremities:   Extremities normal, atraumatic, no cyanosis or edema  Pulses:   2+ and symmetric all  extremities  Skin:   Skin color, texture, turgor normal, no rashes or lesions  Lymph nodes:   Cervical, supraclavicular, and axillary nodes normal  Neurologic:   CNII-XII intact, normal strength, sensation and reflexes    throughout          Assessment & Plan:

## 2010-11-07 NOTE — Assessment & Plan Note (Signed)
Pt has been stable w/out medicines according to labs from cancer center.  Pt asymptomatic.  Will check labs and start meds prn.

## 2010-11-07 NOTE — Patient Instructions (Signed)
Follow up in 1 year or as needed We'll notify you of your lab results Keep up the good work on diet and exercise- you look great! Welcome!!  We're glad to have you!!!

## 2010-11-10 LAB — VITAMIN D 1,25 DIHYDROXY: Vitamin D3 1, 25 (OH)2: 68 pg/mL

## 2010-11-11 NOTE — Progress Notes (Signed)
Quick Note:    Labs mailed.  ______

## 2011-07-21 ENCOUNTER — Other Ambulatory Visit: Payer: Self-pay | Admitting: Obstetrics and Gynecology

## 2011-07-21 DIAGNOSIS — Z853 Personal history of malignant neoplasm of breast: Secondary | ICD-10-CM

## 2011-07-21 DIAGNOSIS — Z1231 Encounter for screening mammogram for malignant neoplasm of breast: Secondary | ICD-10-CM

## 2011-08-28 ENCOUNTER — Ambulatory Visit
Admission: RE | Admit: 2011-08-28 | Discharge: 2011-08-28 | Disposition: A | Discharge status: Home / Self Care | Payer: BC Managed Care – PPO | Source: Ambulatory Visit | Attending: Obstetrics and Gynecology | Admitting: Obstetrics and Gynecology

## 2011-08-28 DIAGNOSIS — Z853 Personal history of malignant neoplasm of breast: Secondary | ICD-10-CM

## 2011-08-28 DIAGNOSIS — Z1231 Encounter for screening mammogram for malignant neoplasm of breast: Secondary | ICD-10-CM

## 2011-11-24 ENCOUNTER — Ambulatory Visit (INDEPENDENT_AMBULATORY_CARE_PROVIDER_SITE_OTHER): Payer: BC Managed Care – PPO | Admitting: Family Medicine

## 2011-11-24 ENCOUNTER — Encounter: Payer: Self-pay | Admitting: Family Medicine

## 2011-11-24 VITALS — BP 120/84 | HR 62 | Temp 97.5°F | Ht 64.0 in | Wt 140.2 lb

## 2011-11-24 DIAGNOSIS — R109 Unspecified abdominal pain: Secondary | ICD-10-CM

## 2011-11-24 LAB — CBC WITH DIFFERENTIAL/PLATELET
Basophils Absolute: 0 10*3/uL (ref 0.0–0.1)
Basophils Relative: 0.4 % (ref 0.0–3.0)
Eosinophils Absolute: 0 10*3/uL (ref 0.0–0.7)
Eosinophils Relative: 0.8 % (ref 0.0–5.0)
HCT: 44.6 % (ref 36.0–46.0)
Hemoglobin: 14.9 g/dL (ref 12.0–15.0)
Lymphocytes Relative: 35.9 % (ref 12.0–46.0)
Lymphs Abs: 1.8 10*3/uL (ref 0.7–4.0)
MCHC: 33.3 g/dL (ref 30.0–36.0)
MCV: 88.5 fl (ref 78.0–100.0)
Monocytes Absolute: 0.4 10*3/uL (ref 0.1–1.0)
Monocytes Relative: 7.8 % (ref 3.0–12.0)
Neutro Abs: 2.8 10*3/uL (ref 1.4–7.7)
Neutrophils Relative %: 55.1 % (ref 43.0–77.0)
Platelets: 262 10*3/uL (ref 150.0–400.0)
RBC: 5.04 Mil/uL (ref 3.87–5.11)
RDW: 13 % (ref 11.5–14.6)
WBC: 5.1 10*3/uL (ref 4.5–10.5)

## 2011-11-24 LAB — BASIC METABOLIC PANEL
BUN: 18 mg/dL (ref 6–23)
CO2: 30 mEq/L (ref 19–32)
Calcium: 9.5 mg/dL (ref 8.4–10.5)
Chloride: 103 mEq/L (ref 96–112)
Creatinine, Ser: 0.8 mg/dL (ref 0.4–1.2)
GFR: 84.2 mL/min (ref >60.00)
Glucose, Bld: 91 mg/dL (ref 70–99)
Potassium: 3.6 mEq/L (ref 3.5–5.1)
Sodium: 139 mEq/L (ref 135–145)

## 2011-11-24 LAB — LIPASE: Lipase: 36 U/L (ref 11.0–59.0)

## 2011-11-24 LAB — AMYLASE: Amylase: 77 U/L (ref 27–131)

## 2011-11-24 LAB — HEPATIC FUNCTION PANEL
Albumin: 4.5 g/dL (ref 3.5–5.2)
Alkaline Phosphatase: 63 U/L (ref 39–117)
Bilirubin, Direct: 0 mg/dL (ref 0.0–0.3)

## 2011-11-24 NOTE — Progress Notes (Signed)
  Subjective:    Patient ID: Olivia [name], female    DOB: 02-01-1961, 51 y.o.   MRN: [id number]  HPI abd pain- sxs started last week, 5 days ago (Wednesday) was the worst.  Considered going to ER.  Pain 'was taking my breath' and pain was described as stabbing under ribs.  Has burning in the center, cramping low.  Mild nausea.  No vomiting.  Chronic loose stools- no change in bowel habits.  Hx of GERD but not currently on meds.  No fevers.  No one w/ similar sxs.  No recent travel, camping, change in diet or water source.  + gas and bloating.   Review of Systems For ROS see HPI     Objective:   Physical Exam  Vitals reviewed. Constitutional: She appears well-developed and well-nourished. No distress.  Neck: Normal range of motion. Neck supple. No thyromegaly present.  Cardiovascular: Normal rate, regular rhythm and normal heart sounds.   Pulmonary/Chest: Effort normal and breath sounds normal. No respiratory distress. She has no wheezes. She has no rales.  Abdominal: Soft. Bowel sounds are normal. She exhibits no distension. There is no tenderness. There is no rebound and no guarding.  Musculoskeletal: She exhibits no edema.  Lymphadenopathy:    She has no cervical adenopathy.  Skin: Skin is warm and dry.  Psychiatric: She has a normal mood and affect. Her behavior is normal. Thought content normal.          Assessment & Plan:

## 2011-11-24 NOTE — Patient Instructions (Addendum)
Start the Dexilant daily We'll notify you of your lab results and determine the next steps Continue to drink plenty of fluids Eat as you are able Call with any questions or concerns Hang in there!

## 2011-11-25 ENCOUNTER — Telehealth: Payer: Self-pay | Admitting: *Deleted

## 2011-11-25 ENCOUNTER — Telehealth: Payer: Self-pay | Admitting: Family Medicine

## 2011-11-25 DIAGNOSIS — R109 Unspecified abdominal pain: Secondary | ICD-10-CM

## 2011-11-25 DIAGNOSIS — R7989 Other specified abnormal findings of blood chemistry: Secondary | ICD-10-CM

## 2011-11-25 NOTE — Telephone Encounter (Signed)
pt called in read notes scheduled flu shot & CPE will send Staff mess to Renee to call patient back

## 2011-11-25 NOTE — Telephone Encounter (Signed)
Noted MD Tabori aware

## 2011-11-25 NOTE — Telephone Encounter (Signed)
Noted request from radiology to change Korea for pt to receive Korea complete not limited, changed order in chart, MD Tabori aware of change

## 2011-11-25 NOTE — Telephone Encounter (Signed)
.  left message to have patient return my call to advise pt needs to schedule CPE to discuss Colonoscopy per MD Beverely Low  and that she can be placed on the nurse schedule for flu shot, pt also needs to discuss Korea order per MD Tabori, pt needs to be fasting and transferred to renee to discuss the [place] as well.

## 2011-11-25 NOTE — Assessment & Plan Note (Signed)
New.  Differential dx: GERD, PUD, pancreatitis, gallstones, IBS, and other less likely abdominal processes.  Check labs to r/o infxn, pancreatic inflammation, liver dysfxn, h pylori.  Start PPI.  Next steps to be determined by labs.  Labs show elevated LFTs.  Get Korea to assess for possible gallstones.  GI referral depending on Korea results.

## 2011-11-25 NOTE — Telephone Encounter (Signed)
Patient was in office yesterday for the following Dx Abd Pain 789.09 Pt now wants to scheduled a flu shot & colonoscopy see my chart mess. Copied below  Appointment Request (HM)      Olivia [name]     Sent: Mon November 24, 2011  4:48 PM    To: P [facility] Support Pool       Olivia [name]    MRN: [id number] DOB: October 20, 1960    Pt Home: [phone]        Entered: [phone]          Message     Appointment Request From: Olivia [name]         With Provider: Neena Rhymes, MD [-Primary Care Physician-]         Preferred Date Range: From 12/18/2011 To 02/03/2012         Preferred Times: Mon Morning, Tues Morning, Wed Morning, Mon Afternoon, Tues Afternoon, Wed Afternoon         Reason: To address the following health maintenance concerns.    Colonoscopy    Influenza Vaccine         Comments:  Is pt ok to get Flu shot based on OV notes & can you enter a referral for a colonoscopy?? Please advise so I can scheduled pt accordingly  Thanks

## 2011-11-26 ENCOUNTER — Ambulatory Visit (INDEPENDENT_AMBULATORY_CARE_PROVIDER_SITE_OTHER): Payer: BC Managed Care – PPO

## 2011-11-26 DIAGNOSIS — Z23 Encounter for immunization: Secondary | ICD-10-CM

## 2011-11-27 ENCOUNTER — Ambulatory Visit (HOSPITAL_BASED_OUTPATIENT_CLINIC_OR_DEPARTMENT_OTHER)
Admission: RE | Admit: 2011-11-27 | Discharge: 2011-11-27 | Disposition: A | Discharge status: Home / Self Care | Payer: BC Managed Care – PPO | Source: Ambulatory Visit | Attending: Family Medicine | Admitting: Family Medicine

## 2011-11-27 DIAGNOSIS — R1013 Epigastric pain: Secondary | ICD-10-CM | POA: Insufficient documentation

## 2011-11-27 DIAGNOSIS — R7989 Other specified abnormal findings of blood chemistry: Secondary | ICD-10-CM

## 2011-11-27 DIAGNOSIS — R109 Unspecified abdominal pain: Secondary | ICD-10-CM

## 2011-11-28 ENCOUNTER — Other Ambulatory Visit (HOSPITAL_BASED_OUTPATIENT_CLINIC_OR_DEPARTMENT_OTHER): Payer: BC Managed Care – PPO

## 2011-12-05 ENCOUNTER — Other Ambulatory Visit (INDEPENDENT_AMBULATORY_CARE_PROVIDER_SITE_OTHER): Payer: BC Managed Care – PPO

## 2011-12-05 DIAGNOSIS — R7989 Other specified abnormal findings of blood chemistry: Secondary | ICD-10-CM

## 2011-12-05 DIAGNOSIS — R945 Abnormal results of liver function studies: Secondary | ICD-10-CM

## 2011-12-05 LAB — HEPATIC FUNCTION PANEL
AST: 26 U/L (ref 0–37)
Total Bilirubin: 0.4 mg/dL (ref 0.3–1.2)

## 2012-01-29 ENCOUNTER — Ambulatory Visit (INDEPENDENT_AMBULATORY_CARE_PROVIDER_SITE_OTHER): Payer: BC Managed Care – PPO | Admitting: Family Medicine

## 2012-01-29 ENCOUNTER — Encounter: Payer: Self-pay | Admitting: Family Medicine

## 2012-01-29 VITALS — BP 106/70 | HR 52 | Temp 97.6°F | Ht 65.0 in | Wt 140.6 lb

## 2012-01-29 DIAGNOSIS — Z0000 Encounter for general adult medical examination without abnormal findings: Secondary | ICD-10-CM

## 2012-01-29 DIAGNOSIS — Z23 Encounter for immunization: Secondary | ICD-10-CM

## 2012-01-29 DIAGNOSIS — Z1211 Encounter for screening for malignant neoplasm of colon: Secondary | ICD-10-CM

## 2012-01-29 LAB — CBC WITH DIFFERENTIAL/PLATELET
Basophils Relative: 0.4 % (ref 0.0–3.0)
Eosinophils Relative: 1.1 % (ref 0.0–5.0)
HCT: 43.7 % (ref 36.0–46.0)
Hemoglobin: 14.8 g/dL (ref 12.0–15.0)
Lymphs Abs: 1.6 10*3/uL (ref 0.7–4.0)
MCV: 86.8 fl (ref 78.0–100.0)
Monocytes Absolute: 0.3 10*3/uL (ref 0.1–1.0)
Monocytes Relative: 7.4 % (ref 3.0–12.0)
Neutro Abs: 2.5 10*3/uL (ref 1.4–7.7)
Platelets: 241 10*3/uL (ref 150.0–400.0)
WBC: 4.5 10*3/uL (ref 4.5–10.5)

## 2012-01-29 LAB — HEPATIC FUNCTION PANEL
ALT: 28 U/L (ref 0–35)
AST: 29 U/L (ref 0–37)
Alkaline Phosphatase: 64 U/L (ref 39–117)
Bilirubin, Direct: 0 mg/dL (ref 0.0–0.3)
Total Bilirubin: 0.5 mg/dL (ref 0.3–1.2)

## 2012-01-29 LAB — BASIC METABOLIC PANEL
BUN: 17 mg/dL (ref 6–23)
Chloride: 102 mEq/L (ref 96–112)
Potassium: 4 mEq/L (ref 3.5–5.1)
Sodium: 138 mEq/L (ref 135–145)

## 2012-01-29 LAB — LIPID PANEL
Total CHOL/HDL Ratio: 5
VLDL: 35.2 mg/dL (ref 0.0–40.0)

## 2012-01-29 NOTE — Progress Notes (Signed)
  Subjective:    Patient ID: Olivia [name], female    DOB: 1960/06/19, 51 y.o.   MRN: [id number]  HPI CPE- UTD on pap, mammo, due to start colonoscopy screening.  No concerns.     Review of Systems Patient reports no vision/ hearing changes, adenopathy,fever, weight change,  persistant/recurrent hoarseness , swallowing issues, chest pain, palpitations, edema, persistant/recurrent cough, hemoptysis, dyspnea (rest/exertional/paroxysmal nocturnal), gastrointestinal bleeding (melena, rectal bleeding), abdominal pain, significant heartburn, bowel changes, GU symptoms (dysuria, hematuria, incontinence), Gyn symptoms (abnormal  bleeding, pain),  syncope, focal weakness, memory loss, skin/hair/nail changes, abnormal bruising or bleeding, anxiety, or depression.   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    Objective:   Physical Exam General Appearance:    Alert, cooperative, no distress, appears stated age  Head:    Normocephalic, without obvious abnormality, atraumatic  Eyes:    PERRL, conjunctiva/corneas clear, EOM's intact, fundi    benign, both eyes  Ears:    Normal TM's and external ear canals, both ears  Nose:   Nares normal, septum midline, mucosa normal, no drainage    or sinus tenderness  Throat:   Lips, mucosa, and tongue normal; teeth and gums normal  Neck:   Supple, symmetrical, trachea midline, no adenopathy;    Thyroid: no enlargement/tenderness/nodules  Back:     Symmetric, no curvature, ROM normal, no CVA tenderness  Lungs:     Clear to auscultation bilaterally, respirations unlabored  Chest Wall:    No tenderness or deformity   Heart:    Regular rate and rhythm, S1 and S2 normal, no murmur, rub   or gallop  Breast Exam:    Deferred to GYN  Abdomen:     Soft, non-tender, bowel sounds active all four quadrants,    no masses, no organomegaly  Genitalia:    Deferred to GYN  Rectal:    Extremities:   Extremities normal, atraumatic, no cyanosis or edema  Pulses:   2+ and symmetric  all extremities  Skin:   Skin color, texture, turgor normal, no rashes or lesions  Lymph nodes:   Cervical, supraclavicular, and axillary nodes normal  Neurologic:   CNII-XII intact, normal strength, sensation and reflexes    throughout          Assessment & Plan:

## 2012-01-29 NOTE — Patient Instructions (Addendum)
Follow up in 1 year or as needed We'll notify you of your lab results and make any changes if needed Keep up the good work!  You look great! We'll call you with your GI appt Call with any questions or concerns Happy New Year!!!

## 2012-01-29 NOTE — Addendum Note (Signed)
Addended by: Verdie Shire on: 01/29/2012 03:10 PM   Modules accepted: Orders

## 2012-01-29 NOTE — Assessment & Plan Note (Signed)
Pt's PE WNL.  UTD on GYN.  Due to start colonoscopy screening- referral made.  Check labs.  Anticipatory guidance provided.

## 2012-01-31 ENCOUNTER — Encounter: Payer: Self-pay | Admitting: Family Medicine

## 2012-02-03 LAB — VITAMIN D 1,25 DIHYDROXY
Vitamin D 1, 25 (OH)2 Total: 73 pg/mL — ABNORMAL HIGH (ref 18–72)
Vitamin D2 1, 25 (OH)2: <8 pg/mL
Vitamin D3 1, 25 (OH)2: 73 pg/mL

## 2012-02-19 ENCOUNTER — Ambulatory Visit (AMBULATORY_SURGERY_CENTER): Payer: BC Managed Care – PPO

## 2012-02-19 ENCOUNTER — Encounter: Payer: Self-pay | Admitting: Gastroenterology

## 2012-02-19 ENCOUNTER — Telehealth: Payer: Self-pay | Admitting: *Deleted

## 2012-02-19 VITALS — Ht 64.0 in | Wt 139.4 lb

## 2012-02-19 DIAGNOSIS — Z1211 Encounter for screening for malignant neoplasm of colon: Secondary | ICD-10-CM

## 2012-02-19 MED ORDER — MOVIPREP 100 G PO SOLR: ORAL | Status: DC

## 2012-02-19 NOTE — Telephone Encounter (Signed)
Received letter via fax from patients pharmacy [facility] that Moviprep is not covered and patient will need to pay out of pocket or prior authorization will need to be done.  I called patient and told her what [facility] sent and that her Moviprep will not be paid for by her insurance. I told patient that I have a free sample of Moviprep.  I told patient I will leave Moviprep out front for her to pick up. Patient verbalized understanding

## 2012-02-25 ENCOUNTER — Telehealth: Payer: Self-pay | Admitting: *Deleted

## 2012-02-25 NOTE — Telephone Encounter (Signed)
Called patient to make sure if patient got Moviprep. Patient did pick up prep

## 2012-03-03 ENCOUNTER — Encounter: Payer: Self-pay | Admitting: Gastroenterology

## 2012-03-03 ENCOUNTER — Ambulatory Visit (AMBULATORY_SURGERY_CENTER): Payer: BC Managed Care – PPO | Admitting: Gastroenterology

## 2012-03-03 VITALS — BP 156/95 | HR 62 | Temp 97.3°F | Resp 14 | Ht 64.0 in | Wt 139.0 lb

## 2012-03-03 DIAGNOSIS — Z1211 Encounter for screening for malignant neoplasm of colon: Secondary | ICD-10-CM

## 2012-03-03 MED ORDER — SODIUM CHLORIDE 0.9 % IV SOLN: 500.0000 mL | INTRAVENOUS | Status: DC

## 2012-03-03 NOTE — Progress Notes (Signed)
Lidocaine-40mg IV prior to Propofol InductionPropofol given over incremental dosages 

## 2012-03-03 NOTE — Op Note (Signed)
[facility] [address] [place], [address]   COLONOSCOPY PROCEDURE REPORT  PATIENT: Olivia [name], Olivia [name]  MR#: [id number] BIRTHDATE: 1960-03-26 , 51  yrs. old GENDER: Female ENDOSCOPIST: Mardella Layman, MD, [facility] REFERRED BY: PROCEDURE DATE:  03/03/2012 PROCEDURE:   Colonoscopy, screening ASA CLASS:   Class II INDICATIONS:Average risk patient for colon cancer. MEDICATIONS: propofol (Diprivan) 200mg  IV  DESCRIPTION OF PROCEDURE:   After the risks and benefits and of the procedure were explained, informed consent was obtained.  A digital rectal exam revealed no abnormalities of the rectum.    The LB CF-[id number]  endoscope was introduced through the anus and advanced to the cecum, which was identified by both the appendix and ileocecal valve .  The quality of the prep was excellent, using MoviPrep .  The instrument was then slowly withdrawn as the colon was fully examined.     COLON FINDINGS: A normal appearing cecum, ileocecal valve, and appendiceal orifice were identified.  The ascending, hepatic flexure, transverse, splenic flexure, descending, sigmoid colon and rectum appeared unremarkable.  No polyps or cancers were seen. Retroflexed views revealed no abnormalities.     The scope was then withdrawn from the patient and the procedure completed.  COMPLICATIONS: There were no complications. ENDOSCOPIC IMPRESSION: Normal colon ...no polyps or cancer.Marland Kitchen  RECOMMENDATIONS: Continue current colorectal screening recommendations for "routine risk" patients with a repeat colonoscopy in 10 years.   REPEAT EXAM:  cc:  _______________________________ eSignedMardella Layman, MD, [facility] 03/03/2012 9:45 AM

## 2012-03-03 NOTE — Progress Notes (Signed)
Patient did not experience any of the following events: a burn prior to discharge; a fall within the facility; wrong site/side/patient/procedure/implant event; or a hospital transfer or hospital admission upon discharge from the facility. (G8907) Patient did not have preoperative order for IV antibiotic SSI prophylaxis. (G8918)  

## 2012-03-03 NOTE — Progress Notes (Addendum)
Assessed Left mid-upper arm, patient denies any pain or tingling. No redness,coolness or swelling noted. Arm warm to touch and soft.

## 2012-03-03 NOTE — Patient Instructions (Addendum)
Normal exam today!!! Repeat colonoscopy in 10 years. Resume current medications. Call us with any questions or concerns. Thank you!!  YOU HAD AN ENDOSCOPIC PROCEDURE TODAY AT THE [facility]: Refer to the procedure report that was given to you for any specific questions about what was found during the examination.  If the procedure report does not answer your questions, please call your gastroenterologist to clarify.  If you requested that your care partner not be given the details of your procedure findings, then the procedure report has been included in a sealed envelope for you to review at your convenience later.  YOU SHOULD EXPECT: Some feelings of bloating in the abdomen. Passage of more gas than usual.  Walking can help get rid of the air that was put into your GI tract during the procedure and reduce the bloating. If you had a lower endoscopy (such as a colonoscopy or flexible sigmoidoscopy) you may notice spotting of blood in your stool or on the toilet paper. If you underwent a bowel prep for your procedure, then you may not have a normal bowel movement for a few days.  DIET: Your first meal following the procedure should be a light meal and then it is ok to progress to your normal diet.  A half-sandwich or bowl of soup is an example of a good first meal.  Heavy or fried foods are harder to digest and may make you feel nauseous or bloated.  Likewise meals heavy in dairy and vegetables can cause extra gas to form and this can also increase the bloating.  Drink plenty of fluids but you should avoid alcoholic beverages for 24 hours.  ACTIVITY: Your care partner should take you home directly after the procedure.  You should plan to take it easy, moving slowly for the rest of the day.  You can resume normal activity the day after the procedure however you should NOT DRIVE or use heavy machinery for 24 hours (because of the sedation medicines used during the test).    SYMPTOMS TO REPORT  IMMEDIATELY: A gastroenterologist can be reached at any hour.  During normal business hours, 8:30 AM to 5:00 PM Monday through Friday, call [phone].  After hours and on weekends, please call the GI answering service at [phone] who will take a message and have the physician on call contact you.   Following lower endoscopy (colonoscopy or flexible sigmoidoscopy):  Excessive amounts of blood in the stool  Significant tenderness or worsening of abdominal pains  Swelling of the abdomen that is new, acute  Fever of 100F or higher   FOLLOW UP: If any biopsies were taken you will be contacted by phone or by letter within the next 1-3 weeks.  Call your gastroenterologist if you have not heard about the biopsies in 3 weeks.  Our staff will call the home number listed on your records the next business day following your procedure to check on you and address any questions or concerns that you may have at that time regarding the information given to you following your procedure. This is a courtesy call and so if there is no answer at the home number and we have not heard from you through the emergency physician on call, we will assume that you have returned to your regular daily activities without incident.  SIGNATURES/CONFIDENTIALITY: You and/or your care partner have signed paperwork which will be entered into your electronic medical record.  These signatures attest to the fact that that  the information above on your After Visit Summary has been reviewed and is understood.  Full responsibility of the confidentiality of this discharge information lies with you and/or your care-partner.

## 2012-03-04 ENCOUNTER — Telehealth: Payer: Self-pay

## 2012-03-04 NOTE — Telephone Encounter (Signed)
Left message on answering machine. 

## 2012-06-07 ENCOUNTER — Other Ambulatory Visit: Payer: BC Managed Care – PPO

## 2012-06-09 ENCOUNTER — Other Ambulatory Visit (INDEPENDENT_AMBULATORY_CARE_PROVIDER_SITE_OTHER): Payer: BC Managed Care – PPO

## 2012-06-09 DIAGNOSIS — E785 Hyperlipidemia, unspecified: Secondary | ICD-10-CM

## 2012-06-10 LAB — LIPID PANEL
HDL: 49.8 mg/dL (ref >39.00)
LDL Cholesterol: 119 mg/dL — ABNORMAL HIGH (ref 0–99)
VLDL: 22.4 mg/dL (ref 0.0–40.0)

## 2012-07-20 ENCOUNTER — Other Ambulatory Visit: Payer: Self-pay

## 2012-07-20 DIAGNOSIS — Z1231 Encounter for screening mammogram for malignant neoplasm of breast: Secondary | ICD-10-CM

## 2012-08-31 ENCOUNTER — Ambulatory Visit: Payer: BC Managed Care – PPO

## 2012-09-13 ENCOUNTER — Ambulatory Visit
Admission: RE | Admit: 2012-09-13 | Discharge: 2012-09-13 | Disposition: A | Discharge status: Home / Self Care | Payer: BC Managed Care – PPO | Source: Ambulatory Visit

## 2012-09-13 DIAGNOSIS — Z1231 Encounter for screening mammogram for malignant neoplasm of breast: Secondary | ICD-10-CM

## 2012-11-09 ENCOUNTER — Ambulatory Visit (INDEPENDENT_AMBULATORY_CARE_PROVIDER_SITE_OTHER): Payer: BC Managed Care – PPO | Admitting: Family Medicine

## 2012-11-09 VITALS — BP 136/88 | Ht 64.0 in | Wt 135.0 lb

## 2012-11-09 DIAGNOSIS — M722 Plantar fascial fibromatosis: Secondary | ICD-10-CM

## 2012-11-09 NOTE — Progress Notes (Signed)
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 She states that this started without injury. She does think that when she ran a 5K that was pre-[place] he that may have triggered some of her soreness. She notes a lot of pain with the first up in the morning. Pain is in the area of the medial calcaneal attachment of the plantar fascia. She has been trying ice, stretching, tennis ball massage, and using a foam roller on her calves. She is wondering if she needs new orthotics today.  ROS: As above in the HPI. All other systems are stable or negative.  OBJECTIVE: APPEARANCE:  Patient in no acute distress.The patient appeared well nourished and normally developed. HEENT: No scleral icterus. Conjunctiva non-injected Resp: Non labored Skin: No rash MSK:  Right foot: - No swelling or deformity. - Tender to palpation over the medial calcaneal attachment of the plantar fascia. No arch tenderness. No pain on great toe extension. - No hallux rigidus. - Patient is noted to have mild over pronation. - With gait testing while jogging she has external rotation and pronation of the right foot - Hip abduction strength is weak on TFL, gluteus medius, gluteus maximus bilaterally    ASSESSMENT: #1. Plantar fasciitis of the right foot   PLAN: Encouraged patient to continue with her stretching and icing routine. Added eccentric heel raises. Stretches first thing in the morning before getting out of bed. We did make modifications to her orthotics today to correct her external rotation and over pronation. Her right orthotic was given a medial wedge as well as a first ray post. After this modification she did have significantly less pronation the next rotation of the foot. I've encouraged her to do straight-line running drills to ensure correct gait form and foot positioning. She was also given hip abduction and  extension exercises to correct hip weakness. Followup in 6 weeks. She may continue to run as long as she is not limping and pain is less than 3/10.

## 2012-11-09 NOTE — Patient Instructions (Signed)
Thank you for coming in today  You have plantar fasciitis We wedged your right shoe with medial wedge and added post under big toe Do plantar fascia stretches and exercises Do straight line running Hip exercises 3-4 x per week Stretch with towel before getting out of bed  Followup 6 weeks.

## 2012-12-09 ENCOUNTER — Other Ambulatory Visit: Payer: Self-pay

## 2013-01-25 ENCOUNTER — Ambulatory Visit: Payer: BC Managed Care – PPO | Admitting: Family Medicine

## 2013-08-11 ENCOUNTER — Other Ambulatory Visit: Payer: Self-pay

## 2013-08-11 DIAGNOSIS — Z1231 Encounter for screening mammogram for malignant neoplasm of breast: Secondary | ICD-10-CM

## 2013-09-15 ENCOUNTER — Ambulatory Visit
Admission: RE | Admit: 2013-09-15 | Discharge: 2013-09-15 | Disposition: A | Discharge status: Home / Self Care | Payer: BC Managed Care – PPO | Source: Ambulatory Visit

## 2013-09-15 DIAGNOSIS — Z1231 Encounter for screening mammogram for malignant neoplasm of breast: Secondary | ICD-10-CM

## 2013-11-18 ENCOUNTER — Other Ambulatory Visit: Payer: Self-pay

## 2014-03-21 ENCOUNTER — Encounter: Payer: Self-pay | Admitting: Physician Assistant

## 2014-03-21 ENCOUNTER — Ambulatory Visit (INDEPENDENT_AMBULATORY_CARE_PROVIDER_SITE_OTHER): Payer: BLUE CROSS/BLUE SHIELD | Admitting: Physician Assistant

## 2014-03-21 VITALS — BP 143/85 | HR 65 | Temp 98.1°F | Resp 16 | Ht 64.0 in | Wt 144.5 lb

## 2014-03-21 DIAGNOSIS — H8303 Labyrinthitis, bilateral: Secondary | ICD-10-CM

## 2014-03-21 DIAGNOSIS — H8309 Labyrinthitis, unspecified ear: Secondary | ICD-10-CM | POA: Insufficient documentation

## 2014-03-21 DIAGNOSIS — J069 Acute upper respiratory infection, unspecified: Secondary | ICD-10-CM | POA: Insufficient documentation

## 2014-03-21 MED ORDER — METHYLPREDNISOLONE ACETATE 40 MG/ML IJ SUSP
40.0000 mg | Freq: Once | INTRAMUSCULAR | Status: AC
  Administered 2014-03-21: 40 mg via INTRAMUSCULAR

## 2014-03-21 MED ORDER — MECLIZINE HCL 25 MG PO TABS: 25.0000 mg | ORAL_TABLET | Freq: Three times a day (TID) | ORAL | Status: DC | PRN

## 2014-03-21 NOTE — Assessment & Plan Note (Signed)
Supportive measures discussed.  Increase fluids.  Mucinex for cough or congestion if needed.  Humidifier in bedroom.  Daily claritin.  Return precautions discussed with patient.

## 2014-03-21 NOTE — Progress Notes (Signed)
    Patient presents to clinic today c/o sore throat, non-productive cough x 2 days.  This morning started feeling ear pressure and dizziness today.  Dizziness is lasting around 5 minutes at a time. Nothing seems to make better or worse.  Also with mild sinus headache today.  Has been taking Advil with some mild improvement in headache.  Past Medical History  Diagnosis Date  . Cancer     breast; chemotherapy and radiation  . Heart murmur   . Thyroid disease     hypothyroidism    No current outpatient prescriptions on file prior to visit.   No current facility-administered medications on file prior to visit.    No Known Allergies  Family History  Problem Relation Age of Onset  . Coronary artery disease Other     1ST DEGREE RELATIVE  . Diabetes Other     1ST DEGREE RELATIVE  . Cancer Other     PROSTATE...1ST DEGREE RELATIVE  . Heart disease Mother   . Diabetes Father   . Kidney disease Father   . Prostate cancer Father     History   Social History  . Marital Status: Married    Spouse Name: N/A  . Number of Children: N/A  . Years of Education: N/A   Social History Main Topics  . Smoking status: Never Smoker   . Smokeless tobacco: Never Used  . Alcohol Use: No  . Drug Use: No  . Sexual Activity: Not on file   Other Topics Concern  . None   Social History Narrative   Review of Systems - See HPI.  All other ROS are negative.  BP 143/85 mmHg  Pulse 65  Temp(Src) 98.1 F (36.7 C) (Oral)  Resp 16  Ht 5\' 4"  (1.626 m)  Wt 144 lb 8 oz (65.545 kg)  BMI 24.79 kg/m2  SpO2 100%  Physical Exam  Constitutional: She is oriented to person, place, and time and well-developed, well-nourished, and in no distress.  HENT:  Head: Normocephalic and atraumatic.  Eyes: Conjunctivae are normal.  Cardiovascular: Normal rate, regular rhythm, normal heart sounds and intact distal pulses.   Pulmonary/Chest: Effort normal and breath sounds normal. No respiratory distress. She has  no wheezes. She has no rales. She exhibits no tenderness.  Neurological: She is alert and oriented to person, place, and time.  Skin: Skin is warm and dry. No rash noted.  Psychiatric: Affect normal.  Vitals reviewed.  Assessment/Plan: Viral URI Supportive measures discussed.  Increase fluids.  Mucinex for cough or congestion if needed.  Humidifier in bedroom.  Daily claritin.  Return precautions discussed with patient.   Viral labyrinthitis 40 mg Im depomedrol given.  Rx meclizine for dizziness.  Supportive measures and prognosis discussed.  Follow-up if not improving over next 2-3 days.

## 2014-03-21 NOTE — Patient Instructions (Addendum)
Please stay well hydrated and get plenty of rest.  Alternate tylenol and ibuprofen for throat pain.  Use Delsym if needed for cough.  Use the Meclizine as directed for dizziness.  Follow-up if symptoms are not improving.

## 2014-03-21 NOTE — Addendum Note (Signed)
Addended by: Rockwell Germany on: 03/21/2014 04:51 PM   Modules accepted: Orders

## 2014-03-21 NOTE — Progress Notes (Signed)
Pre visit review using our clinic review tool, if applicable. No additional management support is needed unless otherwise documented below in the visit note/SLS  

## 2014-03-21 NOTE — Assessment & Plan Note (Signed)
40 mg Im depomedrol given.  Rx meclizine for dizziness.  Supportive measures and prognosis discussed.  Follow-up if not improving over next 2-3 days.

## 2014-06-29 ENCOUNTER — Other Ambulatory Visit: Payer: Self-pay | Admitting: Obstetrics and Gynecology

## 2014-06-30 LAB — CYTOLOGY - PAP

## 2014-08-16 ENCOUNTER — Other Ambulatory Visit: Payer: Self-pay

## 2014-08-16 DIAGNOSIS — Z1231 Encounter for screening mammogram for malignant neoplasm of breast: Secondary | ICD-10-CM

## 2014-09-19 ENCOUNTER — Ambulatory Visit
Admission: RE | Admit: 2014-09-19 | Discharge: 2014-09-19 | Disposition: A | Discharge status: Home / Self Care | Payer: BLUE CROSS/BLUE SHIELD | Source: Ambulatory Visit

## 2014-09-19 DIAGNOSIS — Z1231 Encounter for screening mammogram for malignant neoplasm of breast: Secondary | ICD-10-CM

## 2014-11-30 ENCOUNTER — Ambulatory Visit: Payer: BLUE CROSS/BLUE SHIELD

## 2014-12-05 ENCOUNTER — Ambulatory Visit (INDEPENDENT_AMBULATORY_CARE_PROVIDER_SITE_OTHER): Payer: BLUE CROSS/BLUE SHIELD | Admitting: Sports Medicine

## 2014-12-05 ENCOUNTER — Encounter: Payer: Self-pay | Admitting: Sports Medicine

## 2014-12-05 VITALS — BP 147/87 | HR 63 | Ht 64.0 in | Wt 140.0 lb

## 2014-12-05 DIAGNOSIS — R269 Unspecified abnormalities of gait and mobility: Secondary | ICD-10-CM | POA: Diagnosis not present

## 2014-12-05 DIAGNOSIS — M25579 Pain in unspecified ankle and joints of unspecified foot: Secondary | ICD-10-CM | POA: Diagnosis not present

## 2014-12-05 NOTE — Assessment & Plan Note (Signed)
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

## 2014-12-05 NOTE — Progress Notes (Signed)
Patient ID: Olivia [name], female   DOB: March 12, 1960, 54 y.o.   MRN: [id number]  Patient returns requesting new orthotics She was placed in custom orthotics 7 years ago This resolved her foot and ankle pain She has been seen on several occasions for some hip pain  The suspected cause for her chronic hip and pelvis pain is some osteopenia She is a breast cancer survivor She had chemotherapy and subsequently has osteoporotic change She did not tolerate Fosamax because of side effects She gets chronic hip and pelvis aching Orthotics lessen this when she is walking or running  A second complication of the breast cancer was some neurologic injury to the feet She gets the pain and burning in the feet She is not numb Orthotics helped lessen the pain with this  Past history recurrent stress fractures that resolved with orthotic use in metatarsals and the tibia Hypothyroidism Breast cancer with chemotherapy  Review of systems No fever No night sweats No persistent bone pain except the chronic issues with the hips Mild plantar fascia tightness in the mornings No Achilles tendon  No forefoot pain  Physical examination Pleasant white female in no acute distress BP 147/87 mmHg  Pulse 63  Ht 5\' 4"  (1.626 m)  Wt 140 lb (63.504 kg)  BMI 24.02 kg/m2  Excellent hip range of motion bilaterally Normal pelvic rock 5 of 5 strength on hip abduction and flexion  Feet Tarsal metatarsal bossing at TMT 1 Mild hammering and splaying of toes bilaterally Mild loss of longitudinal arch Loss of transverse arch  Gait is pronated

## 2014-12-05 NOTE — Assessment & Plan Note (Signed)
Patient was fitted for a : standard, cushioned, semi-rigid orthotic. The orthotic was heated and afterward the patient stood on the orthotic blank positioned on the orthotic stand. The patient was positioned in subtalar neutral position and 10 degrees of ankle dorsiflexion in a weight bearing stance. After completion of molding, a stable base was applied to the orthotic blank. The blank was ground to a stable position for weight bearing. Size: 7 red EVA Base: blue med dens EVA Posting: none Additional orthotic padding: none  After completion of the orthotics she had very good comfort with walking and running  able to completely correct the pronation of the left and on her post orthotic gait she is 95% correction of the right  Her prior period lasted 7 years/ we will replace these when they wear out  I spent 45 minutes with the patient face-to-face for evaluation and preparation of orthotics. Greater than 50% of time spent in direct counseling.

## 2014-12-06 ENCOUNTER — Encounter: Payer: BLUE CROSS/BLUE SHIELD | Admitting: Sports Medicine

## 2014-12-26 ENCOUNTER — Encounter: Payer: Self-pay | Admitting: Family Medicine

## 2014-12-26 ENCOUNTER — Ambulatory Visit (INDEPENDENT_AMBULATORY_CARE_PROVIDER_SITE_OTHER): Payer: BLUE CROSS/BLUE SHIELD | Admitting: Family Medicine

## 2014-12-26 VITALS — BP 130/87 | HR 64 | Temp 98.3°F | Resp 20 | Ht 64.0 in | Wt 141.2 lb

## 2014-12-26 DIAGNOSIS — M810 Age-related osteoporosis without current pathological fracture: Secondary | ICD-10-CM | POA: Insufficient documentation

## 2014-12-26 DIAGNOSIS — E2839 Other primary ovarian failure: Secondary | ICD-10-CM

## 2014-12-26 DIAGNOSIS — Z0000 Encounter for general adult medical examination without abnormal findings: Secondary | ICD-10-CM | POA: Insufficient documentation

## 2014-12-26 DIAGNOSIS — Z1159 Encounter for screening for other viral diseases: Secondary | ICD-10-CM | POA: Diagnosis not present

## 2014-12-26 DIAGNOSIS — E042 Nontoxic multinodular goiter: Secondary | ICD-10-CM | POA: Diagnosis not present

## 2014-12-26 DIAGNOSIS — Z114 Encounter for screening for human immunodeficiency virus [HIV]: Secondary | ICD-10-CM | POA: Diagnosis not present

## 2014-12-26 LAB — T3, FREE: T3, Free: 3.3 pg/mL (ref 2.3–4.2)

## 2014-12-26 LAB — CBC WITH DIFFERENTIAL/PLATELET
Basophils Absolute: 0 10*3/uL (ref 0.0–0.1)
Basophils Relative: 0.6 % (ref 0.0–3.0)
EOS PCT: 1 % (ref 0.0–5.0)
Eosinophils Absolute: 0 10*3/uL (ref 0.0–0.7)
HCT: 46.7 % — ABNORMAL HIGH (ref 36.0–46.0)
HEMOGLOBIN: 15.5 g/dL — AB (ref 12.0–15.0)
Lymphocytes Relative: 31.4 % (ref 12.0–46.0)
Lymphs Abs: 1.4 10*3/uL (ref 0.7–4.0)
MCHC: 33.3 g/dL (ref 30.0–36.0)
MCV: 87.7 fl (ref 78.0–100.0)
MONOS PCT: 7.5 % (ref 3.0–12.0)
Monocytes Absolute: 0.3 10*3/uL (ref 0.1–1.0)
Neutro Abs: 2.7 10*3/uL (ref 1.4–7.7)
Neutrophils Relative %: 59.5 % (ref 43.0–77.0)
Platelets: 277 10*3/uL (ref 150.0–400.0)
RBC: 5.33 Mil/uL — AB (ref 3.87–5.11)
RDW: 13.7 % (ref 11.5–15.5)
WBC: 4.5 10*3/uL (ref 4.0–10.5)

## 2014-12-26 LAB — COMPREHENSIVE METABOLIC PANEL
ALBUMIN: 5 g/dL (ref 3.5–5.2)
ALK PHOS: 66 U/L (ref 39–117)
ALT: 24 U/L (ref 0–35)
AST: 25 U/L (ref 0–37)
BILIRUBIN TOTAL: 0.7 mg/dL (ref 0.2–1.2)
BUN: 17 mg/dL (ref 6–23)
CO2: 31 mEq/L (ref 19–32)
Calcium: 9.9 mg/dL (ref 8.4–10.5)
Chloride: 103 mEq/L (ref 96–112)
Creatinine, Ser: 0.87 mg/dL (ref 0.40–1.20)
GFR: 72.25 mL/min (ref >60.00)
Glucose, Bld: 96 mg/dL (ref 70–99)
POTASSIUM: 4.4 meq/L (ref 3.5–5.1)
SODIUM: 143 meq/L (ref 135–145)
TOTAL PROTEIN: 7.5 g/dL (ref 6.0–8.3)

## 2014-12-26 LAB — LIPID PANEL
CHOLESTEROL: 249 mg/dL — AB (ref 0–200)
HDL: 58.5 mg/dL (ref >39.00)
LDL Cholesterol: 161 mg/dL — ABNORMAL HIGH (ref 0–99)
NonHDL: 190.17
Total CHOL/HDL Ratio: 4
Triglycerides: 144 mg/dL (ref 0.0–149.0)
VLDL: 28.8 mg/dL (ref 0.0–40.0)

## 2014-12-26 LAB — VITAMIN D 25 HYDROXY (VIT D DEFICIENCY, FRACTURES): VITD: 37.71 ng/mL (ref 30.00–100.00)

## 2014-12-26 LAB — TSH: TSH: 2.54 u[IU]/mL (ref 0.35–4.50)

## 2014-12-26 LAB — T4, FREE: FREE T4: 0.78 ng/dL (ref 0.60–1.60)

## 2014-12-26 NOTE — Patient Instructions (Addendum)
Health Maintenance, Female Adopting a healthy lifestyle and getting preventive care can go a long way to promote health and wellness. Talk with your health care provider about what schedule of regular examinations is right for you. This is a good chance for you to check in with your provider about disease prevention and staying healthy. In between checkups, there are plenty of things you can do on your own. Experts have done a lot of research about which lifestyle changes and preventive measures are most likely to keep you healthy. Ask your health care provider for more information. WEIGHT AND DIET  Eat a healthy diet  Be sure to include plenty of vegetables, fruits, low-fat dairy products, and lean protein.  Do not eat a lot of foods high in solid fats, added sugars, or salt.  Get regular exercise. This is one of the most important things you can do for your health.  Most adults should exercise for at least 150 minutes each week. The exercise should increase your heart rate and make you sweat (moderate-intensity exercise).  Most adults should also do strengthening exercises at least twice a week. This is in addition to the moderate-intensity exercise.  Maintain a healthy weight  Body mass index (BMI) is a measurement that can be used to identify possible weight problems. It estimates body fat based on height and weight. Your health care provider can help determine your BMI and help you achieve or maintain a healthy weight.  For females 20 years of age and older:   A BMI below 18.5 is considered underweight.  A BMI of 18.5 to 24.9 is normal.  A BMI of 25 to 29.9 is considered overweight.  A BMI of 30 and above is considered obese.  Watch levels of cholesterol and blood lipids  You should start having your blood tested for lipids and cholesterol at 53 years of age, then have this test every 5 years.  You may need to have your cholesterol levels checked more often if:  Your lipid  or cholesterol levels are high.  You are older than 53 years of age.  You are at high risk for heart disease.  CANCER SCREENING   Lung Cancer  Lung cancer screening is recommended for adults 55-80 years old who are at high risk for lung cancer because of a history of smoking.  A yearly low-dose CT scan of the lungs is recommended for people who:  Currently smoke.  Have quit within the past 15 years.  Have at least a 30-pack-year history of smoking. A pack year is smoking an average of one pack of cigarettes a day for 1 year.  Yearly screening should continue until it has been 15 years since you quit.  Yearly screening should stop if you develop a health problem that would prevent you from having lung cancer treatment.  Breast Cancer  Practice breast self-awareness. This means understanding how your breasts normally appear and feel.  It also means doing regular breast self-exams. Let your health care provider know about any changes, no matter how small.  If you are in your 20s or 30s, you should have a clinical breast exam (CBE) by a health care provider every 1-3 years as part of a regular health exam.  If you are 40 or older, have a CBE every year. Also consider having a breast X-ray (mammogram) every year.  If you have a family history of breast cancer, talk to your health care provider about genetic screening.  If you   are at high risk for breast cancer, talk to your health care provider about having an MRI and a mammogram every year.  Breast cancer gene (BRCA) assessment is recommended for women who have family members with BRCA-related cancers. BRCA-related cancers include:  Breast.  Ovarian.  Tubal.  Peritoneal cancers.  Results of the assessment will determine the need for genetic counseling and BRCA1 and BRCA2 testing. Cervical Cancer Your health care provider may recommend that you be screened regularly for cancer of the pelvic organs (ovaries, uterus, and  vagina). This screening involves a pelvic examination, including checking for microscopic changes to the surface of your cervix (Pap test). You may be encouraged to have this screening done every 3 years, beginning at age 21.  For women ages 30-65, health care providers may recommend pelvic exams and Pap testing every 3 years, or they may recommend the Pap and pelvic exam, combined with testing for human papilloma virus (HPV), every 5 years. Some types of HPV increase your risk of cervical cancer. Testing for HPV may also be done on women of any age with unclear Pap test results.  Other health care providers may not recommend any screening for nonpregnant women who are considered low risk for pelvic cancer and who do not have symptoms. Ask your health care provider if a screening pelvic exam is right for you.  If you have had past treatment for cervical cancer or a condition that could lead to cancer, you need Pap tests and screening for cancer for at least 20 years after your treatment. If Pap tests have been discontinued, your risk factors (such as having a new sexual partner) need to be reassessed to determine if screening should resume. Some women have medical problems that increase the chance of getting cervical cancer. In these cases, your health care provider may recommend more frequent screening and Pap tests. Colorectal Cancer  This type of cancer can be detected and often prevented.  Routine colorectal cancer screening usually begins at 53 years of age and continues through 53 years of age.  Your health care provider may recommend screening at an earlier age if you have risk factors for colon cancer.  Your health care provider may also recommend using home test kits to check for hidden blood in the stool.  A small camera at the end of a tube can be used to examine your colon directly (sigmoidoscopy or colonoscopy). This is done to check for the earliest forms of colorectal  cancer.  Routine screening usually begins at age 50.  Direct examination of the colon should be repeated every 5-10 years through 53 years of age. However, you may need to be screened more often if early forms of precancerous polyps or small growths are found. Skin Cancer  Check your skin from head to toe regularly.  Tell your health care provider about any new moles or changes in moles, especially if there is a change in a mole's shape or color.  Also tell your health care provider if you have a mole that is larger than the size of a pencil eraser.  Always use sunscreen. Apply sunscreen liberally and repeatedly throughout the day.  Protect yourself by wearing long sleeves, pants, a wide-brimmed hat, and sunglasses whenever you are outside. HEART DISEASE, DIABETES, AND HIGH BLOOD PRESSURE   High blood pressure causes heart disease and increases the risk of stroke. High blood pressure is more likely to develop in:  People who have blood pressure in the high end   of the normal range (130-139/85-89 mm Hg).  People who are overweight or obese.  People who are African American.  If you are 38-23 years of age, have your blood pressure checked every 3-5 years. If you are 61 years of age or older, have your blood pressure checked every year. You should have your blood pressure measured twice--once when you are at a hospital or clinic, and once when you are not at a hospital or clinic. Record the average of the two measurements. To check your blood pressure when you are not at a hospital or clinic, you can use:  An automated blood pressure machine at a pharmacy.  A home blood pressure monitor.  If you are between 45 years and 39 years old, ask your health care provider if you should take aspirin to prevent strokes.  Have regular diabetes screenings. This involves taking a blood sample to check your fasting blood sugar level.  If you are at a normal weight and have a low risk for diabetes,  have this test once every three years after 53 years of age.  If you are overweight and have a high risk for diabetes, consider being tested at a younger age or more often. PREVENTING INFECTION  Hepatitis B  If you have a higher risk for hepatitis B, you should be screened for this virus. You are considered at high risk for hepatitis B if:  You were born in a country where hepatitis B is common. Ask your health care provider which countries are considered high risk.  Your parents were born in a high-risk country, and you have not been immunized against hepatitis B (hepatitis B vaccine).  You have HIV or AIDS.  You use needles to inject street drugs.  You live with someone who has hepatitis B.  You have had sex with someone who has hepatitis B.  You get hemodialysis treatment.  You take certain medicines for conditions, including cancer, organ transplantation, and autoimmune conditions. Hepatitis C  Blood testing is recommended for:  Everyone born from [birth year] through 1964.  Anyone with known risk factors for hepatitis C. Sexually transmitted infections (STIs)  You should be screened for sexually transmitted infections (STIs) including gonorrhea and chlamydia if:  You are sexually active and are younger than 53 years of age.  You are older than 53 years of age and your health care provider tells you that you are at risk for this type of infection.  Your sexual activity has changed since you were last screened and you are at an increased risk for chlamydia or gonorrhea. Ask your health care provider if you are at risk.  If you do not have HIV, but are at risk, it may be recommended that you take a prescription medicine daily to prevent HIV infection. This is called pre-exposure prophylaxis (PrEP). You are considered at risk if:  You are sexually active and do not regularly use condoms or know the HIV status of your partner(s).  You take drugs by injection.  You are sexually  active with a partner who has HIV. Talk with your health care provider about whether you are at high risk of being infected with HIV. If you choose to begin PrEP, you should first be tested for HIV. You should then be tested every 3 months for as long as you are taking PrEP.  PREGNANCY   If you are premenopausal and you may become pregnant, ask your health care provider about preconception counseling.  If you may  become pregnant, take 400 to 800 micrograms (mcg) of folic acid every day.  If you want to prevent pregnancy, talk to your health care provider about birth control (contraception). OSTEOPOROSIS AND MENOPAUSE   Osteoporosis is a disease in which the bones lose minerals and strength with aging. This can result in serious bone fractures. Your risk for osteoporosis can be identified using a bone density scan.  If you are 59 years of age or older, or if you are at risk for osteoporosis and fractures, ask your health care provider if you should be screened.  Ask your health care provider whether you should take a calcium or vitamin D supplement to lower your risk for osteoporosis.  Menopause may have certain physical symptoms and risks.  Hormone replacement therapy may reduce some of these symptoms and risks. Talk to your health care provider about whether hormone replacement therapy is right for you.  HOME CARE INSTRUCTIONS   Schedule regular health, dental, and eye exams.  Stay current with your immunizations.   Do not use any tobacco products including cigarettes, chewing tobacco, or electronic cigarettes.  If you are pregnant, do not drink alcohol.  If you are breastfeeding, limit how much and how often you drink alcohol.  Limit alcohol intake to no more than 1 drink per day for nonpregnant women. One drink equals 12 ounces of beer, 5 ounces of wine, or 1 ounces of hard liquor.  Do not use street drugs.  Do not share needles.  Ask your health care provider for help if  you need support or information about quitting drugs.  Tell your health care provider if you often feel depressed.  Tell your health care provider if you have ever been abused or do not feel safe at home.   This information is not intended to replace advice given to you by your health care provider. Make sure you discuss any questions you have with your health care provider.   Document Released: 08/05/2010 Document Revised: 02/10/2014 Document Reviewed: 12/22/2012 Elsevier Interactive Patient Education 2016 [employer].   Take a vitamin D supplement 800 iu daily.

## 2014-12-26 NOTE — Progress Notes (Signed)
Subjective:    Patient ID: Olivia [name], female    DOB: 1960/09/07, 54 y.o.   MRN: [id number]  HPI  Patient presents for transfer of care to [facility] office. All past medical history, surgical history, allergies, family history, immunizations and social history was obtained/updated   Health maintenance:  Colonoscopy:2014, no polys or fhx, 10 year follow-up  Mammogram: mammogram yearly, breast cancer history, right breast 2003.  mammograms are order through gynecologist.  Cervical cancer screening/pelvic: Pelvic 2016, normal. Hysterectomy, has gyn. Dr. Matthew Saras. Immunizations: UTD flu and Tdap.  Infectious disease screening: HIV and Hep c indicated Bone density: Osteoporosis.  > 2 years agoLast bone density through gynecology . patient has been on Fosamax in the past, with negative side effects causing her to discontinue. She is currently not taking a vitamin D supplementation or on any osteoporosis treatment.  Patient has a dental home.  Past Medical History  Diagnosis Date  . Cancer [facility])     breast; chemotherapy and radiation  . Heart murmur   . Thyroid disease     hypothyroidism   No Known Allergies  Past Surgical History  Procedure Laterality Date  . Therapeutic hysterectomy      for treatment of breast treatment  . Breast surgery  2003 /right    lumpectomy; chemotherapy and radiation   Family History  Problem Relation Age of Onset  . Coronary artery disease Other     1ST DEGREE RELATIVE  . Diabetes Other     1ST DEGREE RELATIVE  . Cancer Other     PROSTATE...1ST DEGREE RELATIVE  . Heart disease Mother   . Diabetes Father   . Kidney disease Father   . Prostate cancer Father    Social History   Social History  . Marital Status: Married    Spouse Name: N/A  . Number of Children: N/A  . Years of Education: N/A   Occupational History  . Not on file.   Social History Main Topics  . Smoking status: Never Smoker   . Smokeless tobacco: Never Used  . Alcohol  Use: No  . Drug Use: No  . Sexual Activity: Yes    Birth Control/ Protection: Surgical   Other Topics Concern  . Not on file   Social History Narrative   Married. Bobby.   Children: none. No pets.    Stay at home.    Wears her seatbelt. Smoke alarm in the home.    Feels safe in her relationships.    Exercise 5x week.      Review of Systems Negative, with the exception of above mentioned in HPI    Objective:   Physical Exam BP 130/87 mmHg  Pulse 64  Temp(Src) 98.3 F (36.8 C) (Oral)  Resp 20  Ht $R'5\' 4"'ud$  (1.626 m)  Wt 141 lb 4 oz (64.071 kg)  BMI 24.23 kg/m2  SpO2 97% Gen: Afebrile. No acute distress. nontoxic in appearance, well-developed, well-nourished Caucasian female. Very pleasant.   HENT: AT. [place]. Bilateral TM visualized and normal in appearance. external auditory canals normal in appearance. MMM, no oral lesions .  good dentition.Bilateral nareswithout erythema or swelling. Throat without erythema or exudates. No cough exam, no hoarseness on exam.  Eyes:Pupils Equal Round Reactive to light, Extraocular movements intact,  Conjunctiva without redness, discharge or icterus. Neck/lymp/endocrine: Supple, no  lymphadenopathy, thyromegaly present with nodules.  CV: RRR no murmur appreciated, no clicks gallops or rubs, no+2/4 P posterior tibialis pulses Chest: CTAB, no wheeze or crackles  Abd: Soft. Flat, NTND, no  Masses palpated.  no hepatosplenomegaly. Skin: no  rashes, purpura or petechiae.  Neuro/MSK: Normal gait. PERLA. EOMi. Alert. Oriented x3. Cranial nerves II through XII intact. Muscle strength 5/5  upper and lowermity. No erythema, no joint swelling, no obvious deformities. Full range of motion DTRs equal bilaterally. Neurovascularly distally Psych: Normal affect, dress and demeanor. Normal speech. Normal thought content and judgment.     Assessment & Plan:  Encounter for preventive health examination - CBC w/Diff - Comp Met (CMET) - Lipid panel  2. Multiple  thyroid nodules - History of multiple thyroid nodules, easily palpable on exam today. Patient has had normal TSH levels for greater than 10 years. She's had one ultrasound at diagnosis only. - TSH - T3, free - T4, free  3. Screening for HIV (human immunodeficiency virus) - Patient agreeable to HIV screening today. - HIV antibody (with reflex)  4. Need for hepatitis C screening test - Patient agreeable to hepatitis C screening today. - Hepatitis C Antibody  5. Osteoporosis/estrogen deficiency - Patient encouraged to start oral supplementation of vitamin D 800 international units. - Repeat bone density since has been greater than 2 years, patient will likely need to be placed on osteoporotic treatment. Unable to tolerate Fosamax. - Vitamin D (25 hydroxy) - DG Bone Density; Future  F/U 1 year for CPE

## 2014-12-27 ENCOUNTER — Telehealth: Payer: Self-pay | Admitting: Infectious Disease

## 2014-12-27 LAB — HEPATITIS C ANTIBODY: HCV Ab: NEGATIVE

## 2014-12-27 LAB — HIV ANTIBODY (ROUTINE TESTING W REFLEX): HIV 1&2 Ab, 4th Generation: REACTIVE — AB

## 2014-12-27 NOTE — Telephone Encounter (Signed)
HIV test 4th generation not done yet. This EITHER acute HIV or a false +  Kim the acute study is not yet open correct?

## 2014-12-29 LAB — HIV 1/2 CONFIRMATION
HIV 1 ANTIBODY: NEGATIVE
HIV-2 Ab: NEGATIVE

## 2015-01-01 ENCOUNTER — Telehealth: Payer: Self-pay | Admitting: Family Medicine

## 2015-01-01 DIAGNOSIS — Z21 Asymptomatic human immunodeficiency virus [HIV] infection status: Secondary | ICD-10-CM

## 2015-01-01 LAB — HIV-1 RNA, QUALITATIVE, TMA

## 2015-01-01 NOTE — Telephone Encounter (Addendum)
Attempted to call pt this evening: - Her Cholesterol is elevated and she should be encouraged to take a fish oil supplementation, continue exercising 150 minutes a week or more, and eat a healthy diet of fresh fruit/vegetables and lean meats.  - I had encouraged her to take 800 iu of vitamin D daily during her OV, she should be encouraged to start this medication. Her Vit D level was low normal.  - her HIV needs an additional lab collected to confirm it is negative. I would like to speak with her personally concerning this, I have placed the additional lab order in epic for collection. If she calls back, I would be happy to talk to her.

## 2015-01-02 ENCOUNTER — Other Ambulatory Visit (INDEPENDENT_AMBULATORY_CARE_PROVIDER_SITE_OTHER): Payer: BLUE CROSS/BLUE SHIELD

## 2015-01-02 DIAGNOSIS — Z21 Asymptomatic human immunodeficiency virus [HIV] infection status: Secondary | ICD-10-CM

## 2015-01-02 NOTE — Telephone Encounter (Signed)
Spoke with pt today concerning lab work detailed in prior note.  - Pt counseled on + HIV test/screen. Explained in detail this is a screening process only, thus a reactive result  Needs to be further tested. Explained to pt she is negative for HIV-1/2 ab, which suggest the positive screen is either a false positive or new infection. Unfortunately there was not enough blood to run  the RNA load to rule out/in HIV. This order has been placed and pt will make appt to have lab drawn. Pt was given the opportunity to ask questions and agreed with plan.

## 2015-01-08 LAB — HIV-1 RNA, QUALITATIVE, TMA: HIV-1 RNA, Qualitative, TMA: NOT DETECTED

## 2015-01-09 ENCOUNTER — Telehealth: Payer: Self-pay | Admitting: Family Medicine

## 2015-01-09 NOTE — Telephone Encounter (Signed)
Spoke with patient reviewed lab results. 

## 2015-01-09 NOTE — Telephone Encounter (Signed)
Please call pt: Her HIV test has finally resulted and is negative.  No antibodies and no acute infection.  It was a false positive screen initially.

## 2015-02-01 ENCOUNTER — Ambulatory Visit
Admission: RE | Admit: 2015-02-01 | Discharge: 2015-02-01 | Disposition: A | Discharge status: Home / Self Care | Payer: BLUE CROSS/BLUE SHIELD | Source: Ambulatory Visit | Attending: Family Medicine | Admitting: Family Medicine

## 2015-02-01 DIAGNOSIS — E2839 Other primary ovarian failure: Secondary | ICD-10-CM

## 2015-02-01 DIAGNOSIS — M810 Age-related osteoporosis without current pathological fracture: Secondary | ICD-10-CM

## 2015-02-02 ENCOUNTER — Telehealth: Payer: Self-pay | Admitting: Family Medicine

## 2015-02-02 MED ORDER — DENOSUMAB 60 MG/ML ~~LOC~~ SOLN: 60.0000 mg | SUBCUTANEOUS | Status: DC

## 2015-02-02 NOTE — Telephone Encounter (Signed)
Left message to return call 

## 2015-02-02 NOTE — Telephone Encounter (Signed)
spoke with patient reviewed information and instructions. Patient is willing to try Prolia injections. She will increase vit D to 1000u daily.

## 2015-02-02 NOTE — Telephone Encounter (Signed)
Request for insurance authorization sent to [employer] to process.

## 2015-02-02 NOTE — Telephone Encounter (Signed)
Please call pt: - her DEXA scan resulted with severe osteoporosis. Pt knows she has osteoporosis from the past. She has been on fosamax (with a negative side effect). I do not have her prior reports from GYN (who had completed her prior dexa), but she is -3.4 now, which is pretty severe osteo. She needs to take 1000 u vit d daily OTC, and she has to start a treatment dose of an osteoporosis medication. I would like to see if we can get her started on prolia injections every 6 months. Please explain this would be a nurse visit for subq injection. If she is amendable I will place this order to get the approval process started.  - She is at risk for fractures and should be cautious.  - Please advise.

## 2015-02-02 NOTE — Telephone Encounter (Signed)
prolia ordered

## 2015-02-08 ENCOUNTER — Telehealth: Payer: Self-pay | Admitting: Family Medicine

## 2015-02-08 NOTE — Telephone Encounter (Signed)
I have electronically submitted pt's info for Prolia insurance verification and will notify you once I have a response. Thank you. °

## 2015-02-12 NOTE — Telephone Encounter (Signed)
I have Ms. Brubacher's insurance verification and [employer] is requiring a prior authorization.  I have completed the top portion of the p/a form, however, either you or Dr. Raoul Pitch will need to answer qustions 1-3, then Dr. Raoul Pitch will need to sign at the bottom.  I will fax the form to you if you send me your fax #.  Also, once complete, please return to my attn via fax # [phone]. Thank you.

## 2015-02-13 NOTE — Telephone Encounter (Signed)
Fax the form to [phone]. Thank you

## 2015-02-13 NOTE — Telephone Encounter (Signed)
Faxed form to your attn.  Thank you.

## 2015-02-14 NOTE — Telephone Encounter (Signed)
Form completed and faxed back with documentation.

## 2015-02-19 NOTE — Telephone Encounter (Signed)
Faxed completed p/a form along w/Dexa scan from 02/01/2015 to [facility].

## 2015-03-08 NOTE — Telephone Encounter (Signed)
I have rec'd authorization [id number] from [facility] for [place], effective 02/19/2015-02/19/2016.  With an OV pt will have an estimated responsibility of a $10 copay; w/out an OV pt will have an estimated responsibility of $0.  Please advise Olivia [name] this is an only an estimate and we will not know an exact amt until insurance has paid.  I have sent a copy of the summary of benefits and the authorization to be scanned into her chart.  Once pt rec's her injection, please send me the actual injection date.  If you have any questions, please let me know. Thank you.

## 2015-03-09 NOTE — Telephone Encounter (Signed)
Patient aware of approval. Patient needs to be contacted once Prolia is available. Olivia [name] to order.

## 2015-03-09 NOTE — Telephone Encounter (Signed)
Have you contacted this patient?

## 2015-03-09 NOTE — Telephone Encounter (Signed)
Prolia injection ordered

## 2015-03-12 NOTE — Telephone Encounter (Signed)
LMOM for pt to CB to schedule prolia injection.

## 2015-03-20 ENCOUNTER — Ambulatory Visit (INDEPENDENT_AMBULATORY_CARE_PROVIDER_SITE_OTHER): Payer: BLUE CROSS/BLUE SHIELD | Admitting: *Deleted

## 2015-03-20 DIAGNOSIS — M810 Age-related osteoporosis without current pathological fracture: Secondary | ICD-10-CM

## 2015-03-20 MED ORDER — DENOSUMAB 60 MG/ML ~~LOC~~ SOLN
60.0000 mg | Freq: Once | SUBCUTANEOUS | Status: AC
  Administered 2015-03-20: 60 mg via SUBCUTANEOUS

## 2015-03-20 NOTE — Progress Notes (Signed)
Patient presents today for prolia injection. Patient tolerated injection well. Next injection due 09/17/15.

## 2015-03-22 ENCOUNTER — Ambulatory Visit: Payer: BLUE CROSS/BLUE SHIELD

## 2015-04-18 ENCOUNTER — Emergency Department (HOSPITAL_BASED_OUTPATIENT_CLINIC_OR_DEPARTMENT_OTHER): Payer: BLUE CROSS/BLUE SHIELD

## 2015-04-18 ENCOUNTER — Ambulatory Visit: Payer: BLUE CROSS/BLUE SHIELD | Admitting: Family Medicine

## 2015-04-18 ENCOUNTER — Other Ambulatory Visit: Payer: Self-pay

## 2015-04-18 ENCOUNTER — Emergency Department (HOSPITAL_BASED_OUTPATIENT_CLINIC_OR_DEPARTMENT_OTHER)
Admission: EM | Admit: 2015-04-18 | Discharge: 2015-04-18 | Disposition: A | Discharge status: Home / Self Care | Payer: BLUE CROSS/BLUE SHIELD | Attending: Emergency Medicine | Admitting: Emergency Medicine

## 2015-04-18 ENCOUNTER — Encounter (HOSPITAL_BASED_OUTPATIENT_CLINIC_OR_DEPARTMENT_OTHER): Payer: Self-pay

## 2015-04-18 ENCOUNTER — Telehealth: Payer: Self-pay | Admitting: *Deleted

## 2015-04-18 DIAGNOSIS — M810 Age-related osteoporosis without current pathological fracture: Secondary | ICD-10-CM | POA: Insufficient documentation

## 2015-04-18 DIAGNOSIS — Z79899 Other long term (current) drug therapy: Secondary | ICD-10-CM | POA: Insufficient documentation

## 2015-04-18 DIAGNOSIS — Z853 Personal history of malignant neoplasm of breast: Secondary | ICD-10-CM | POA: Diagnosis not present

## 2015-04-18 DIAGNOSIS — R42 Dizziness and giddiness: Secondary | ICD-10-CM | POA: Diagnosis not present

## 2015-04-18 DIAGNOSIS — R079 Chest pain, unspecified: Secondary | ICD-10-CM

## 2015-04-18 DIAGNOSIS — Z8739 Personal history of other diseases of the musculoskeletal system and connective tissue: Secondary | ICD-10-CM | POA: Diagnosis not present

## 2015-04-18 DIAGNOSIS — R011 Cardiac murmur, unspecified: Secondary | ICD-10-CM | POA: Diagnosis not present

## 2015-04-18 DIAGNOSIS — K2970 Gastritis, unspecified, without bleeding: Secondary | ICD-10-CM | POA: Diagnosis not present

## 2015-04-18 HISTORY — DX: Age-related osteoporosis without current pathological fracture: M81.0

## 2015-04-18 LAB — BASIC METABOLIC PANEL
ANION GAP: 10 (ref 5–15)
BUN: 17 mg/dL (ref 6–20)
CALCIUM: 9.4 mg/dL (ref 8.9–10.3)
CO2: 28 mmol/L (ref 22–32)
CREATININE: 0.86 mg/dL (ref 0.44–1.00)
Chloride: 101 mmol/L (ref 101–111)
GFR calc non Af Amer: >60 mL/min (ref >60)
Glucose, Bld: 98 mg/dL (ref 65–99)
Potassium: 3.8 mmol/L (ref 3.5–5.1)
SODIUM: 139 mmol/L (ref 135–145)

## 2015-04-18 LAB — CBC
HCT: 42.7 % (ref 36.0–46.0)
HEMOGLOBIN: 14.6 g/dL (ref 12.0–15.0)
MCH: 29.1 pg (ref 26.0–34.0)
MCHC: 34.2 g/dL (ref 30.0–36.0)
MCV: 85.1 fL (ref 78.0–100.0)
Platelets: 267 10*3/uL (ref 150–400)
RBC: 5.02 MIL/uL (ref 3.87–5.11)
RDW: 13.2 % (ref 11.5–15.5)
WBC: 6 10*3/uL (ref 4.0–10.5)

## 2015-04-18 LAB — TROPONIN I

## 2015-04-18 MED ORDER — GI COCKTAIL ~~LOC~~
30.0000 mL | Freq: Once | ORAL | Status: AC
  Administered 2015-04-18: 30 mL via ORAL
  Filled 2015-04-18: qty 30

## 2015-04-18 MED ORDER — MECLIZINE HCL 25 MG PO TABS: 25.0000 mg | ORAL_TABLET | Freq: Three times a day (TID) | ORAL | Status: DC | PRN

## 2015-04-18 MED ORDER — MECLIZINE HCL 25 MG PO TABS
25.0000 mg | ORAL_TABLET | Freq: Once | ORAL | Status: AC
  Administered 2015-04-18: 25 mg via ORAL
  Filled 2015-04-18: qty 1

## 2015-04-18 MED ORDER — OMEPRAZOLE 20 MG PO CPDR: 20.0000 mg | DELAYED_RELEASE_CAPSULE | Freq: Two times a day (BID) | ORAL | Status: DC

## 2015-04-18 MED FILL — OMEPRAZOLE DR 20 MG CAPSULE: 20 | 30 days supply | Qty: 60 | Fill #0

## 2015-04-18 MED FILL — MECLIZINE 25 MG TABLET: 25 | 7 days supply | Qty: 20 | Fill #0

## 2015-04-18 NOTE — Telephone Encounter (Signed)
Spoke with patient she states she feels like elephant is sitting on her chest. She states she feels like she cant take a deep breath .She has dizziness,headache back of her head. She denies any congestion or cold symptoms. No nausea at this time. Patient advised to go to ER for evaluation of chest pain. Patient agreed to go to ER.

## 2015-04-18 NOTE — ED Notes (Signed)
Pt reports one week of intermittent chest pressure, sensation of elephant sitting on her chest, has associated dizziness, shortness of breath.

## 2015-04-18 NOTE — ED Provider Notes (Signed)
CSN: [id number]     Arrival date & time 04/18/15  1511 History   First MD Initiated Contact with Patient 04/18/15 1526     Chief Complaint  Patient presents with  . Chest Pain      HPI  Patient presents for evaluation of chest pressure and dizziness. She describes symptoms starting on Thursday 5 days ago. She states she developed dizziness which she describes as the room spinning. It is worse with her eyes closed or when she attempts to change positions. She noticed some left chest pressure on the same day. She states this was "just before I went from [place]". States she ran 5 miles that day. She is red 15 miles in the interval without any exertional symptoms.  She became concerned because of the persistence of pain. Has been present all day today. The vertigo remains intermittent. Her mother had early cardiac disease in her 69s. She has 2 siblings without heart disease. She has no other risk for heart disease, no history of hypertension, hypercholesteremia, diabetes, she is a lifetime nonsmoker.  Past Medical History  Diagnosis Date  . Cancer [facility])     breast; chemotherapy and radiation  . Heart murmur   . Thyroid disease     hypothyroidism  . Osteoporosis    Past Surgical History  Procedure Laterality Date  . Therapeutic hysterectomy      for treatment of breast treatment  . Breast surgery  2003 /right    lumpectomy; chemotherapy and radiation   Family History  Problem Relation Age of Onset  . Coronary artery disease Other     1ST DEGREE RELATIVE  . Diabetes Other     1ST DEGREE RELATIVE  . Cancer Other     PROSTATE...1ST DEGREE RELATIVE  . Heart disease Mother   . Diabetes Father   . Kidney disease Father   . Prostate cancer Father    Social History  Substance Use Topics  . Smoking status: Never Smoker   . Smokeless tobacco: Never Used  . Alcohol Use: No   OB History    No data available     Review of Systems  Constitutional: Negative for fever, chills,  diaphoresis, appetite change and fatigue.  HENT: Negative for mouth sores, sore throat and trouble swallowing.   Eyes: Negative for visual disturbance.  Respiratory: Negative for cough, chest tightness, shortness of breath and wheezing.   Cardiovascular: Positive for chest pain.  Gastrointestinal: Negative for nausea, vomiting, abdominal pain, diarrhea and abdominal distention.  Endocrine: Negative for polydipsia, polyphagia and polyuria.  Genitourinary: Negative for dysuria, frequency and hematuria.  Musculoskeletal: Negative for gait problem.  Skin: Negative for color change, pallor and rash.  Neurological: Positive for dizziness. Negative for syncope, light-headedness and headaches.  Hematological: Does not bruise/bleed easily.  Psychiatric/Behavioral: Negative for behavioral problems and confusion.      Allergies  Review of patient's allergies indicates no known allergies.  Home Medications   Prior to Admission medications   Medication Sig Start Date End Date Taking? Authorizing Provider  Multiple Vitamins-Minerals (MULTIVITAMIN WITH MINERALS) tablet Take 1 tablet by mouth daily.   Yes Historical Provider, MD  Omega-3 Fatty Acids (FISH OIL) 1200 MG CAPS Take by mouth.   Yes Historical Provider, MD  denosumab (PROLIA) 60 MG/ML SOLN injection Inject 60 mg into the skin every 6 (six) months. Administer in upper arm, thigh, or abdomen 02/02/15   Renee A Kuneff, DO  meclizine (ANTIVERT) 25 MG tablet Take 1 tablet (25 mg total)  by mouth 3 (three) times daily as needed. 04/18/15   Tanna Furry, MD  omeprazole (PRILOSEC) 20 MG capsule Take 1 capsule (20 mg total) by mouth 2 (two) times daily. 04/18/15   Tanna Furry, MD   BP 146/103 mmHg  Pulse 67  Temp(Src) 98 F (36.7 C) (Oral)  Resp 16  Ht 5\' 4"  (1.626 m)  Wt 140 lb (63.504 kg)  BMI 24.02 kg/m2  SpO2 98% Physical Exam  Constitutional: She is oriented to person, place, and time. She appears well-developed and well-nourished. No  distress.  HENT:  Head: Normocephalic.  Normal TMs. No nystagmus.  Eyes: Conjunctivae are normal. Pupils are equal, round, and reactive to light. No scleral icterus.  Neck: Normal range of motion. Neck supple. No thyromegaly present.  Cardiovascular: Normal rate and regular rhythm.  Exam reveals no gallop and no friction rub.   No murmur heard. Pulmonary/Chest: Effort normal and breath sounds normal. No respiratory distress. She has no wheezes. She has no rales.  Abdominal: Soft. Bowel sounds are normal. She exhibits no distension. There is no tenderness. There is no rebound.  Musculoskeletal: Normal range of motion.  Neurological: She is alert and oriented to person, place, and time.  Skin: Skin is warm and dry. No rash noted.  Psychiatric: She has a normal mood and affect. Her behavior is normal.    ED Course  Procedures (including critical care time) Labs Review Labs Reviewed  CBC  BASIC METABOLIC PANEL  TROPONIN I    Imaging Review Dg Chest 2 View  04/18/2015  CLINICAL DATA:  One week of intermittent chest pressure and shortness of breath. History of breast carcinoma on the right. EXAM: CHEST  2 VIEW COMPARISON:  Jun 23, 2006 FINDINGS: There is slight atelectasis in the left base region. Lungs elsewhere are clear. Heart size and pulmonary vascularity are normal. No adenopathy. There are surgical clips in the right axillary region. No bone lesions are evident. There is stable upper lumbar levoscoliosis. IMPRESSION: Slight left base atelectasis. No edema or consolidation. No change in cardiac silhouette. Electronically Signed   By: Lowella Grip III M.D.   On: 04/18/2015 15:37   I have personally reviewed and evaluated these images and lab results as part of my medical decision-making.   EKG Interpretation   Date/Time:  Wednesday April 18 2015 15:18:09 EDT Ventricular Rate:  64 PR Interval:  154 QRS Duration: 78 QT Interval:  406 QTC Calculation: 418 R Axis:   21 Text  Interpretation:  Normal sinus rhythm Biphasic P wave, possible atrial  enlargement Isolated Inverted T to V1, IIII.   No ST changes. Confirmed by  Jeneen Rinks  MD, [place] ([id number]) on 04/18/2015 3:39:17 PM      MDM   Final diagnoses:  Vertigo  Chest pain, unspecified chest pain type  Gastritis    Patient has a low heart score. She runs 15 miles per week a symptomatically and is continued to do so. Has vertigo and some GI symptoms. Plan Prilosec, Antivert. With her mother's history of early cardiac disease given her referral to cardiology to talk about routine testing for her. If she develops exertional symptoms have asked her to recheck immediately    Tanna Furry, MD 04/18/15 [phone]

## 2015-04-18 NOTE — Discharge Instructions (Signed)
Benign Positional Vertigo Vertigo is the feeling that you or your surroundings are moving when they are not. Benign positional vertigo is the most common form of vertigo. The cause of this condition is not serious (is benign). This condition is triggered by certain movements and positions (is positional). This condition can be dangerous if it occurs while you are doing something that could endanger you or others, such as driving.  CAUSES In many cases, the cause of this condition is not known. It may be caused by a disturbance in an area of the inner ear that helps your brain to sense movement and balance. This disturbance can be caused by a viral infection (labyrinthitis), head injury, or repetitive motion. RISK FACTORS This condition is more likely to develop in:  Women.  People who are 50 years of age or older. SYMPTOMS Symptoms of this condition usually happen when you move your head or your eyes in different directions. Symptoms may start suddenly, and they usually last for less than a minute. Symptoms may include:  Loss of balance and falling.  Feeling like you are spinning or moving.  Feeling like your surroundings are spinning or moving.  Nausea and vomiting.  Blurred vision.  Dizziness.  Involuntary eye movement (nystagmus). Symptoms can be mild and cause only slight annoyance, or they can be severe and interfere with daily life. Episodes of benign positional vertigo may return (recur) over time, and they may be triggered by certain movements. Symptoms may improve over time. DIAGNOSIS This condition is usually diagnosed by medical history and a physical exam of the head, neck, and ears. You may be referred to a health care provider who specializes in ear, nose, and throat (ENT) problems (otolaryngologist) or a provider who specializes in disorders of the nervous system (neurologist). You may have additional testing, including:  MRI.  A CT scan.  Eye movement tests. Your  health care provider may ask you to change positions quickly while he or she watches you for symptoms of benign positional vertigo, such as nystagmus. Eye movement may be tested with an electronystagmogram (ENG), caloric stimulation, the Dix-Hallpike test, or the roll test.  An electroencephalogram (EEG). This records electrical activity in your brain.  Hearing tests. TREATMENT Usually, your health care provider will treat this by moving your head in specific positions to adjust your inner ear back to normal. Surgery may be needed in severe cases, but this is rare. In some cases, benign positional vertigo may resolve on its own in 2-4 weeks. HOME CARE INSTRUCTIONS Safety  Move slowly.Avoid sudden body or head movements.  Avoid driving.  Avoid operating heavy machinery.  Avoid doing any tasks that would be dangerous to you or others if a vertigo episode would occur.  If you have trouble walking or keeping your balance, try using a cane for stability. If you feel dizzy or unstable, sit down right away.  Return to your normal activities as told by your health care provider. Ask your health care provider what activities are safe for you. General Instructions  Take over-the-counter and prescription medicines only as told by your health care provider.  Avoid certain positions or movements as told by your health care provider.  Drink enough fluid to keep your urine clear or pale yellow.  Keep all follow-up visits as told by your health care provider. This is important. SEEK MEDICAL CARE IF:  You have a fever.  Your condition gets worse or you develop new symptoms.  Your family or friends   notice any behavioral changes.  Your nausea or vomiting gets worse.  You have numbness or a "pins and needles" sensation. SEEK IMMEDIATE MEDICAL CARE IF:  You have difficulty speaking or moving.  You are always dizzy.  You faint.  You develop severe headaches.  You have weakness in your  legs or arms.  You have changes in your hearing or vision.  You develop a stiff neck.  You develop sensitivity to light.   This information is not intended to replace advice given to you by your health care provider. Make sure you discuss any questions you have with your health care provider.   Document Released: 10/28/2005 Document Revised: 10/11/2014 Document Reviewed: 05/15/2014 Elsevier Interactive Patient Education 2016 Elsevier Inc.  

## 2015-04-23 NOTE — Progress Notes (Signed)
Cardiology Office Note:    Date:  04/24/2015   ID:  Olivia [name], DOB 1961-01-19, MRN [id number]  PCP:  Howard Pouch, DO  Cardiologist:  New - seen by Dr. Allegra Lai today  Electrophysiologist:  N/a  Referring MD: Dr. Tanna Furry [facility] ED)  Chief Complaint  Patient presents with  . Hospitalization Follow-up    ED 3/15 with Chest Pain    History of Present Illness:     Olivia [name] is a 55 y.o. female with a hx of HL, breast CA, hypothyroidism, osteoporosis.    Seen in the emergency room 04/18/15 with complaints of chest pressure and dizziness. Dizziness was described as spinning. Troponin level was normal.  She was felt to be low risk for major adverse cardiac event. She had some GI symptoms. She was placed on PPI and meclizine for vertiginous symptoms. Given family history, outpatient cardiology follow-up was recommended.  10 year ASCVD risk 2%.  She is very active and exercises several times a week.  She is training for a 1/2 marathon.  Shad an episode of chest pain about 2 weeks ago prior to a run.  If felt like pressure and was substernal.  She felt better while running. She has continued to have episodes of chest pain since.  She denies exertional symptoms. She denies assoc symptoms. She may be a little short of breath with her symptoms.  She called her PCP who directed her to the ED. She has had some improvement with the PPI.  But, she continues to have symptoms.    Past Medical History  Diagnosis Date  . Breast cancer ([place])     lumpectomy; chemotherapy and radiation  . Heart murmur     as a child - Echo in early 20s "ok"  . Hypothyroidism   . Osteoporosis   . HLD (hyperlipidemia)     Past Surgical History  Procedure Laterality Date  . Total abdominal hysterectomy w/ bilateral salpingoophorectomy      for treatment of breast treatment  . Breast surgery  2003 /right    lumpectomy; chemotherapy and radiation    Current Medications: Outpatient Prescriptions Prior  to Visit  Medication Sig Dispense Refill  . denosumab (PROLIA) 60 MG/ML SOLN injection Inject 60 mg into the skin every 6 (six) months. Administer in upper arm, thigh, or abdomen 1 Syringe 1  . Multiple Vitamins-Minerals (MULTIVITAMIN WITH MINERALS) tablet Take 1 tablet by mouth daily.    . Omega-3 Fatty Acids (FISH OIL) 1200 MG CAPS Take by mouth.    Marland Kitchen omeprazole (PRILOSEC) 20 MG capsule Take 1 capsule (20 mg total) by mouth 2 (two) times daily. 60 capsule 1  . meclizine (ANTIVERT) 25 MG tablet Take 1 tablet (25 mg total) by mouth 3 (three) times daily as needed. 20 tablet 0   No facility-administered medications prior to visit.     Allergies:   Review of patient's allergies indicates no known allergies.   Social History   Social History  . Marital Status: Married    Spouse Name: N/A  . Number of Children: N/A  . Years of Education: N/A   Social History Main Topics  . Smoking status: Never Smoker   . Smokeless tobacco: Never Used  . Alcohol Use: 0.0 oz/week    0 Standard drinks or equivalent per week     Comment: Rare  . Drug Use: No  . Sexual Activity: Yes    Birth Control/ Protection: Surgical   Other Topics Concern  .  None   Social History Narrative   Married. Bobby.   Children: none. No pets.    Stay at home.    Wears her seatbelt. Smoke alarm in the home.    Feels safe in her relationships.    Exercise 5x week.      Family History:  The patient's family history includes Cancer in her other; Coronary artery disease in her mother; Diabetes in her father and other; Heart attack (age of onset: 46) in her mother; Kidney disease in her father; Prostate cancer in her father.   ROS:   Please see the history of present illness.    Review of Systems  Cardiovascular: Positive for chest pain.  Neurological: Positive for dizziness.  All other systems reviewed and are negative.   Physical Exam:    VS:  BP 132/82 mmHg  Pulse 78  Ht 5\' 4"  (1.626 m)  Wt 146 lb 3.2 oz  (66.316 kg)  BMI 25.08 kg/m2  SpO2 98%   GEN: Well nourished, well developed, in no acute distress HEENT: normal Neck: no JVD, no masses Cardiac: Normal S1/S2, RRR; no murmurs, rubs, or gallops, no edema;  no carotid bruits,   Respiratory:  clear to auscultation bilaterally; no wheezing, rhonchi or rales GI: soft, nontender, nondistended MS: no deformity or atrophy Skin: warm and dry Neuro: No focal deficits  Psych: Alert and oriented x 3, normal affect  Wt Readings from Last 3 Encounters:  04/24/15 146 lb 3.2 oz (66.316 kg)  04/18/15 140 lb (63.504 kg)  12/26/14 141 lb 4 oz (64.071 kg)      Studies/Labs Reviewed:     EKG:  EKG is  ordered today.  The ekg ordered today demonstrates NSR, HR 67, normal axis, no change from prior tracing   Recent Labs: 12/26/2014: ALT 24; TSH 2.54 04/18/2015: BUN 17; Creatinine, Ser 0.86; Hemoglobin 14.6; Platelets 267; Potassium 3.8; Sodium 139   Recent Lipid Panel    Component Value Date/Time   CHOL 249* 12/26/2014 0911   TRIG 144.0 12/26/2014 0911   HDL 58.50 12/26/2014 0911   CHOLHDL 4 12/26/2014 0911   VLDL 28.8 12/26/2014 0911   LDLCALC 161* 12/26/2014 0911   LDLDIRECT 170.3 01/29/2012 1105    Additional studies/ records that were reviewed today include:   None    ASSESSMENT:     1. Precordial pain   2. Hyperlipidemia     PLAN:     In order of problems listed above:  1. Chest pain - Her chest pain is atypical for the most part.  She does have a hx of a murmur as a child and she previously had chemotherapy for her breast cancer.  She is an avid runner and is training for a 1/2 marathon.  She has HL and FHx of CAD. I have recommended proceeding with an Echocardiogram and Plain ETT. I reviewed this with Dr. Allegra Lai (DOD) who saw the patient and agreed. If the above are normal, FU with Cardiology prn.  -  Echo  -  ETT  -  FU with PCP/GI if above normal  2. HL - Managed by PCP. Fish oil recently added.    Medication  Adjustments/Labs and Tests Ordered: Current medicines are reviewed at length with the patient today.  Concerns regarding medicines are outlined above.  Medication changes, Labs and Tests ordered today are outlined in the Patient Instructions noted below. Patient Instructions  Medication Instructions:  Your physician recommends that you continue on your current medications as  directed. Please refer to the Current Medication list given to you today.  Labwork: NONE  Testing/Procedures: 1. Your physician has requested that you have an echocardiogram. Echocardiography is a painless test that uses sound waves to create images of your heart. It provides your doctor with information about the size and shape of your heart and how well your heart's chambers and valves are working. This procedure takes approximately one hour. There are no restrictions for this procedure.  2. Your physician has requested that you have an exercise tolerance test. For further information please visit HugeFiesta.[email or web address]. Please also follow instruction sheet, as given.  Follow-Up: AS NEEDED WITH Andrea Colglazier, PAC   Any Other Special Instructions Will Be Listed Below (If Applicable).  If you need a refill on your cardiac medications before your next appointment, please call your pharmacy.   Signed, Richardson Dopp, PA-C  04/24/2015 3:37 PM    [facility] [address], [place], [place]  [address] Phone: [phone]; Fax: [phone]

## 2015-04-24 ENCOUNTER — Encounter: Payer: Self-pay | Admitting: Physician Assistant

## 2015-04-24 ENCOUNTER — Ambulatory Visit (INDEPENDENT_AMBULATORY_CARE_PROVIDER_SITE_OTHER): Payer: BLUE CROSS/BLUE SHIELD | Admitting: Physician Assistant

## 2015-04-24 ENCOUNTER — Encounter: Payer: Self-pay | Admitting: Family Medicine

## 2015-04-24 VITALS — BP 132/82 | HR 78 | Ht 64.0 in | Wt 146.2 lb

## 2015-04-24 DIAGNOSIS — E785 Hyperlipidemia, unspecified: Secondary | ICD-10-CM | POA: Diagnosis not present

## 2015-04-24 DIAGNOSIS — R079 Chest pain, unspecified: Secondary | ICD-10-CM | POA: Diagnosis not present

## 2015-04-24 DIAGNOSIS — R072 Precordial pain: Secondary | ICD-10-CM | POA: Diagnosis not present

## 2015-04-24 NOTE — Patient Instructions (Addendum)
Medication Instructions:  Your physician recommends that you continue on your current medications as directed. Please refer to the Current Medication list given to you today.  Labwork: NONE  Testing/Procedures: 1. Your physician has requested that you have an echocardiogram. Echocardiography is a painless test that uses sound waves to create images of your heart. It provides your doctor with information about the size and shape of your heart and how well your heart's chambers and valves are working. This procedure takes approximately one hour. There are no restrictions for this procedure.  2. Your physician has requested that you have an exercise tolerance test. For further information please visit HugeFiesta[employer][email or web address]. Please also follow instruction sheet, as given.  Follow-Up: AS NEEDED WITH SCOTT WEAVER, PAC   Any Other Special Instructions Will Be Listed Below (If Applicable).  If you need a refill on your cardiac medications before your next appointment, please call your pharmacy.

## 2015-05-05 DIAGNOSIS — Z9289 Personal history of other medical treatment: Secondary | ICD-10-CM

## 2015-05-05 HISTORY — DX: Personal history of other medical treatment: Z92.89

## 2015-05-10 ENCOUNTER — Telehealth: Payer: Self-pay | Admitting: *Deleted

## 2015-05-10 ENCOUNTER — Other Ambulatory Visit: Payer: Self-pay

## 2015-05-10 ENCOUNTER — Ambulatory Visit (INDEPENDENT_AMBULATORY_CARE_PROVIDER_SITE_OTHER): Payer: BLUE CROSS/BLUE SHIELD

## 2015-05-10 ENCOUNTER — Encounter: Payer: Self-pay | Admitting: Physician Assistant

## 2015-05-10 ENCOUNTER — Ambulatory Visit (HOSPITAL_COMMUNITY): Payer: BLUE CROSS/BLUE SHIELD | Attending: Cardiology

## 2015-05-10 DIAGNOSIS — Z8249 Family history of ischemic heart disease and other diseases of the circulatory system: Secondary | ICD-10-CM | POA: Insufficient documentation

## 2015-05-10 DIAGNOSIS — R079 Chest pain, unspecified: Secondary | ICD-10-CM | POA: Diagnosis not present

## 2015-05-10 DIAGNOSIS — I358 Other nonrheumatic aortic valve disorders: Secondary | ICD-10-CM | POA: Insufficient documentation

## 2015-05-10 DIAGNOSIS — I517 Cardiomegaly: Secondary | ICD-10-CM | POA: Insufficient documentation

## 2015-05-10 DIAGNOSIS — R072 Precordial pain: Secondary | ICD-10-CM

## 2015-05-10 DIAGNOSIS — E785 Hyperlipidemia, unspecified: Secondary | ICD-10-CM | POA: Insufficient documentation

## 2015-05-10 NOTE — Telephone Encounter (Signed)
Pt notified of echo results by phone with verbal understanding 

## 2015-05-11 ENCOUNTER — Telehealth: Payer: Self-pay | Admitting: *Deleted

## 2015-05-11 ENCOUNTER — Encounter: Payer: Self-pay | Admitting: Physician Assistant

## 2015-05-11 LAB — EXERCISE TOLERANCE TEST
CHL CUP STRESS STAGE 1 DBP: 81 mmHg
CHL CUP STRESS STAGE 1 GRADE: 0 %
CHL CUP STRESS STAGE 1 SBP: 121 mmHg
CHL CUP STRESS STAGE 1 SPEED: 0 mph
CHL CUP STRESS STAGE 10 HR: 81 {beats}/min
CHL CUP STRESS STAGE 10 SBP: 119 mmHg
CHL CUP STRESS STAGE 2 HR: 71 {beats}/min
CHL CUP STRESS STAGE 4 HR: 107 {beats}/min
CHL CUP STRESS STAGE 4 SBP: 128 mmHg
CHL CUP STRESS STAGE 4 SPEED: 1.7 mph
CHL CUP STRESS STAGE 5 DBP: 72 mmHg
CHL CUP STRESS STAGE 5 GRADE: 12 %
CHL CUP STRESS STAGE 5 HR: 121 {beats}/min
CHL CUP STRESS STAGE 6 SPEED: 3.4 mph
CHL CUP STRESS STAGE 7 DBP: 68 mmHg
CHL CUP STRESS STAGE 7 GRADE: 16 %
CHL CUP STRESS STAGE 8 HR: 155 {beats}/min
CHL CUP STRESS STAGE 9 GRADE: 0 %
CHL CUP STRESS STAGE 9 HR: 123 {beats}/min
CHL CUP STRESS STAGE 9 SBP: 136 mmHg
CHL CUP STRESS STAGE 9 SPEED: 1.5 mph
CHL RATE OF PERCEIVED EXERTION: 15
CSEPHR: 92 %
Estimated workload: 13.4 METS
Exercise duration (min): 12 min
Exercise duration (sec): 0 s
MPHR: 167 {beats}/min
Peak HR: 155 {beats}/min
Percent of predicted max HR: 92 %
Rest HR: 58 {beats}/min
Stage 1 HR: 71 {beats}/min
Stage 10 DBP: 74 mmHg
Stage 10 Grade: 0 %
Stage 10 Speed: 0 mph
Stage 2 Grade: 0 %
Stage 2 Speed: 1 mph
Stage 3 Grade: 0.1 %
Stage 3 HR: 70 {beats}/min
Stage 3 Speed: 1 mph
Stage 4 DBP: 76 mmHg
Stage 4 Grade: 10 %
Stage 5 SBP: 145 mmHg
Stage 5 Speed: 2.5 mph
Stage 6 Grade: 14 %
Stage 6 HR: 137 {beats}/min
Stage 7 HR: 155 {beats}/min
Stage 7 SBP: 179 mmHg
Stage 7 Speed: 4.2 mph
Stage 8 Grade: 16 %
Stage 8 Speed: 4.2 mph
Stage 9 DBP: 65 mmHg

## 2015-05-11 NOTE — Telephone Encounter (Signed)
Pt notified of stress test results normal by phone with verbal understanding.

## 2015-05-16 DIAGNOSIS — D225 Melanocytic nevi of trunk: Secondary | ICD-10-CM | POA: Diagnosis not present

## 2015-05-16 DIAGNOSIS — D2371 Other benign neoplasm of skin of right lower limb, including hip: Secondary | ICD-10-CM | POA: Diagnosis not present

## 2015-05-31 ENCOUNTER — Encounter: Payer: Self-pay | Admitting: Gastroenterology

## 2015-05-31 ENCOUNTER — Ambulatory Visit (INDEPENDENT_AMBULATORY_CARE_PROVIDER_SITE_OTHER): Payer: BLUE CROSS/BLUE SHIELD | Admitting: Gastroenterology

## 2015-05-31 VITALS — BP 140/80 | HR 59 | Ht 64.0 in | Wt 142.8 lb

## 2015-05-31 DIAGNOSIS — K219 Gastro-esophageal reflux disease without esophagitis: Secondary | ICD-10-CM

## 2015-05-31 NOTE — Patient Instructions (Addendum)
Decrease Prilosec to every other day for a week, then take Pepcid complete (or generic) every other day for a week, then use that same med as needed for heartburn/chest pain.  If problems are worsening after that, please call us. [phone]  If you are age 54 or younger, your body mass index should be between 19-25. Your Body mass index is 24.5 kg/(m^2). If this is out of the aformentioned range listed, please consider follow up with your Primary Care Provider.   Thank you for choosing [facility]  Dr Wilfrid Lund III

## 2015-05-31 NOTE — Progress Notes (Signed)
[facility] Consult Note:  History: Olivia [name] 05/31/2015  Referring physician: Howard Pouch, DO  Reason for consult/chief complaint: Gastroesophageal Reflux   Subjective HPI:  Olivia [name] was referred to me after recent ED visit. She had a normal screening colonoscopy with Dr. Sharlett Iles in 2014.  Several weeks ago she came to the ED with a few days of persistent central chest pressure with dizziness and dyspnea. There appeared to be no exertional component, as it did not occur after running. She had no EKG changes, normal labs, and was sent home when she got relief with a GI cocktail. She's been on Prilosec once a day since then and has had no further symptoms. There was no trauma or muscle sprain preceding this chest pain. ROS:  Review of Systems  Constitutional: Negative for appetite change and unexpected weight change.  HENT: Negative for mouth sores and voice change.   Eyes: Negative for pain and redness.  Respiratory: Negative for cough and shortness of breath.   Cardiovascular: Negative for palpitations.  Genitourinary: Negative for dysuria and hematuria.  Musculoskeletal: Negative for myalgias and arthralgias.  Skin: Negative for pallor and rash.  Neurological: Negative for weakness and headaches.  Hematological: Negative for adenopathy.     Past Medical History: Past Medical History  Diagnosis Date  . Breast cancer ([place])     lumpectomy; chemotherapy and radiation  . Heart murmur     as a child - Echo in early 20s "ok"  . Hypothyroidism   . Osteoporosis   . HLD (hyperlipidemia)   . History of echocardiogram     Echo 4/17: EF 55-60%, GLS -19.6%, normal wall motion  . History of exercise stress test     ETT 4/17 - No ST changes     Past Surgical History: Past Surgical History  Procedure Laterality Date  . Total abdominal hysterectomy w/ bilateral salpingoophorectomy      for treatment of breast treatment  . Breast surgery  2003 /right     lumpectomy; chemotherapy and radiation     Family History: Family History  Problem Relation Age of Onset  . Coronary artery disease Mother     1ST DEGREE RELATIVE  . Diabetes Other     1ST DEGREE RELATIVE  . Cancer Other     PROSTATE...1ST DEGREE RELATIVE  . Heart attack Mother 74  . Diabetes Father   . Kidney disease Father   . Prostate cancer Father   No gastric or esophageal cancer  Social History: Social History   Social History  . Marital Status: Married    Spouse Name: N/A  . Number of Children: N/A  . Years of Education: N/A   Social History Main Topics  . Smoking status: Never Smoker   . Smokeless tobacco: Never Used  . Alcohol Use: 0.0 oz/week    0 Standard drinks or equivalent per week     Comment: Rare  . Drug Use: No  . Sexual Activity: Yes    Birth Control/ Protection: Surgical   Other Topics Concern  . None   Social History Narrative   Married. Bobby.   Children: none. No pets.    Stay at home.    Wears her seatbelt. Smoke alarm in the home.    Feels safe in her relationships.    Exercise 5x week.     Allergies: No Known Allergies  Outpatient Meds: Current Outpatient Prescriptions  Medication Sig Dispense Refill  . Calcium Carb-Cholecalciferol 600-800 MG-UNIT TABS Take 1 tablet by mouth  daily.    . denosumab (PROLIA) 60 MG/ML SOLN injection Inject 60 mg into the skin every 6 (six) months. Administer in upper arm, thigh, or abdomen 1 Syringe 1  . meclizine (ANTIVERT) 25 MG tablet Take 25 mg by mouth 3 (three) times daily as needed for dizziness.    . Multiple Vitamins-Minerals (MULTIVITAMIN WITH MINERALS) tablet Take 1 tablet by mouth daily.    . Omega-3 Fatty Acids (FISH OIL) 1200 MG CAPS Take by mouth.    Marland Kitchen omeprazole (PRILOSEC) 20 MG capsule Take 1 capsule (20 mg total) by mouth 2 (two) times daily. 60 capsule 1   No current facility-administered medications for this visit.       ___________________________________________________________________ Objective  Exam:  BP 140/80 mmHg  Pulse 59  Ht 5\' 4"  (1.626 m)  Wt 142 lb 12.8 oz (64.774 kg)  BMI 24.50 kg/m2  SpO2 98%   General: this is a(n) well-appearing middle-aged woman with good muscle mass   Eyes: sclera anicteric, no redness  ENT: oral mucosa moist without lesions, no cervical or supraclavicular lymphadenopathy, good dentition  CV: RRR without murmur, S1/S2, no JVD, no peripheral edema  Resp: clear to auscultation bilaterally, normal RR and effort noted  GI: soft, no tenderness, with active bowel sounds. No guarding or palpable organomegaly noted.  Skin; warm and dry, no rash or jaundice noted  Neuro: awake, alert and oriented x 3. Normal gross motor function and fluent speech  Labs:  Lab Results  Component Value Date   WBC 6.0 04/18/2015   HGB 14.6 04/18/2015   HCT 42.7 04/18/2015   MCV 85.1 04/18/2015   PLT 267 04/18/2015   CMP Latest Ref Rng 04/18/2015 12/26/2014 01/29/2012  Glucose 65 - 99 mg/dL 98 96 97  BUN 6 - 20 mg/dL 17 17 17   Creatinine 0.44 - 1.00 mg/dL 0.86 0.87 0.9  Sodium 135 - 145 mmol/L 139 143 138  Potassium 3.5 - 5.1 mmol/L 3.8 4.4 4.0  Chloride 101 - 111 mmol/L 101 103 102  CO2 22 - 32 mmol/L 28 31 29   Calcium 8.9 - 10.3 mg/dL 9.4 9.9 9.2  Total Protein 6.0 - 8.3 g/dL - 7.5 7.6  Total Bilirubin 0.2 - 1.2 mg/dL - 0.7 0.5  Alkaline Phos 39 - 117 U/L - 66 64  AST 0 - 37 U/L - 25 29  ALT 0 - 35 U/L - 24 28     chest x-ray normal   Assessment:  Encounter Diagnosis  Name Primary?  . Gastroesophageal reflux disease, esophagitis presence not specified Yes   Symptoms under good control with no red flag symptoms.  Plan:  Plan given to de-escalate anti-secretory therapy over next 2 weeks down to as needed pepcid complete. OV as needed Thank you for the courtesy of this consult.  Please call me with any questions or concerns.  Nelida Meuse III

## 2015-07-03 DIAGNOSIS — Z01419 Encounter for gynecological examination (general) (routine) without abnormal findings: Secondary | ICD-10-CM | POA: Diagnosis not present

## 2015-07-03 DIAGNOSIS — Z6824 Body mass index (BMI) 24.0-24.9, adult: Secondary | ICD-10-CM | POA: Diagnosis not present

## 2015-08-22 ENCOUNTER — Other Ambulatory Visit: Payer: Self-pay | Admitting: Family Medicine

## 2015-08-22 DIAGNOSIS — Z1231 Encounter for screening mammogram for malignant neoplasm of breast: Secondary | ICD-10-CM

## 2015-09-17 ENCOUNTER — Ambulatory Visit (INDEPENDENT_AMBULATORY_CARE_PROVIDER_SITE_OTHER): Payer: BLUE CROSS/BLUE SHIELD | Admitting: Family Medicine

## 2015-09-17 DIAGNOSIS — M810 Age-related osteoporosis without current pathological fracture: Secondary | ICD-10-CM | POA: Diagnosis not present

## 2015-09-17 MED ORDER — DENOSUMAB 60 MG/ML ~~LOC~~ SOLN
60.0000 mg | Freq: Once | SUBCUTANEOUS | Status: AC
  Administered 2015-09-17: 60 mg via SUBCUTANEOUS

## 2015-09-17 NOTE — Progress Notes (Signed)
Patient arrived at office today for prolia injection.  Patient tolerated injection well.

## 2015-09-21 ENCOUNTER — Ambulatory Visit: Payer: BLUE CROSS/BLUE SHIELD

## 2015-09-26 ENCOUNTER — Ambulatory Visit
Admission: RE | Admit: 2015-09-26 | Discharge: 2015-09-26 | Disposition: A | Discharge status: Home / Self Care | Payer: BLUE CROSS/BLUE SHIELD | Source: Ambulatory Visit | Attending: Family Medicine | Admitting: Family Medicine

## 2015-09-26 DIAGNOSIS — Z1231 Encounter for screening mammogram for malignant neoplasm of breast: Secondary | ICD-10-CM | POA: Diagnosis not present

## 2015-10-03 DIAGNOSIS — Z23 Encounter for immunization: Secondary | ICD-10-CM | POA: Diagnosis not present

## 2015-11-06 DIAGNOSIS — Z636 Dependent relative needing care at home: Secondary | ICD-10-CM | POA: Diagnosis not present

## 2015-11-06 DIAGNOSIS — F4323 Adjustment disorder with mixed anxiety and depressed mood: Secondary | ICD-10-CM | POA: Diagnosis not present

## 2015-11-06 DIAGNOSIS — Z9189 Other specified personal risk factors, not elsewhere classified: Secondary | ICD-10-CM | POA: Diagnosis not present

## 2015-11-09 DIAGNOSIS — F4329 Adjustment disorder with other symptoms: Secondary | ICD-10-CM

## 2015-11-09 HISTORY — DX: Adjustment disorder with other symptoms: F43.29

## 2015-11-27 ENCOUNTER — Encounter: Payer: Self-pay | Admitting: Family Medicine

## 2015-11-27 ENCOUNTER — Ambulatory Visit (INDEPENDENT_AMBULATORY_CARE_PROVIDER_SITE_OTHER): Payer: BLUE CROSS/BLUE SHIELD | Admitting: Family Medicine

## 2015-11-27 VITALS — BP 139/89 | HR 65 | Temp 98.7°F | Resp 20 | Wt 145.2 lb

## 2015-11-27 DIAGNOSIS — J0100 Acute maxillary sinusitis, unspecified: Secondary | ICD-10-CM | POA: Diagnosis not present

## 2015-11-27 MED ORDER — HYDROCODONE-HOMATROPINE 5-1.5 MG/5ML PO SYRP: 5.0000 mL | ORAL_SOLUTION | Freq: Three times a day (TID) | ORAL | 0 refills | Status: DC | PRN

## 2015-11-27 MED ORDER — DOXYCYCLINE HYCLATE 100 MG PO TABS: 100.0000 mg | ORAL_TABLET | Freq: Two times a day (BID) | ORAL | 0 refills | Status: DC

## 2015-11-27 NOTE — Progress Notes (Signed)
Olivia [name] , 1960/02/22, 55 y.o., female MRN: [id number] Patient Care Team    Relationship Specialty Notifications Start End  Ma Hillock, DO PCP - General Family Medicine  12/26/14     CC: cough Subjective: Pt presents for an acute OV with complaints of cough of > 1 week duration that started after furniture market. Initially her symptoms started with nasal congestion, sore throat, sinus pressure. She was becoming better, but the cough and now frontal headache has continued.  She feels chest tightness like her bronchitis last year and ear pressure.  No Known Allergies Social History  Substance Use Topics  . Smoking status: Never Smoker  . Smokeless tobacco: Never Used  . Alcohol use 0.0 oz/week     Comment: Rare   Past Medical History:  Diagnosis Date  . Breast cancer ([place])    lumpectomy; chemotherapy and radiation  . Heart murmur    as a child - Echo in early 20s "ok"  . History of echocardiogram    Echo 4/17: EF 55-60%, GLS -19.6%, normal wall motion  . History of exercise stress test    ETT 4/17 - No ST changes  . HLD (hyperlipidemia)   . Hypothyroidism   . Osteoporosis    Past Surgical History:  Procedure Laterality Date  . BREAST SURGERY  2003 /right   lumpectomy; chemotherapy and radiation  . TOTAL ABDOMINAL HYSTERECTOMY W/ BILATERAL SALPINGOOPHORECTOMY     for treatment of breast treatment   Family History  Problem Relation Age of Onset  . Diabetes Other     1ST DEGREE RELATIVE  . Cancer Other     PROSTATE...1ST DEGREE RELATIVE  . Coronary artery disease Mother     1ST DEGREE RELATIVE  . Heart attack Mother 20  . Diabetes Father   . Kidney disease Father   . Prostate cancer Father      Medication List       Accurate as of 11/27/15  4:21 PM. Always use your most recent med list.          Calcium Carb-Cholecalciferol 600-800 MG-UNIT Tabs Take 1 tablet by mouth daily.   denosumab 60 MG/ML Soln injection Commonly known as:   PROLIA Inject 60 mg into the skin every 6 (six) months. Administer in upper arm, thigh, or abdomen   Fish Oil 1200 MG Caps Take by mouth.   meclizine 25 MG tablet Commonly known as:  ANTIVERT Take 25 mg by mouth 3 (three) times daily as needed for dizziness.   multivitamin with minerals tablet Take 1 tablet by mouth daily.   omeprazole 20 MG capsule Commonly known as:  PRILOSEC Take 1 capsule (20 mg total) by mouth 2 (two) times daily.       No results found for this or any previous visit (from the past 24 hour(s)). No results found.   ROS: Negative, with the exception of above mentioned in HPI   Objective:  BP 139/89 (BP Location: Left Arm, Patient Position: Sitting, Cuff Size: Normal)   Pulse 65   Temp 98.7 F (37.1 C)   Resp 20   Wt 145 lb 4 oz (65.9 kg)   SpO2 98%   BMI 24.93 kg/m  Body mass index is 24.93 kg/m. Gen: Afebrile. No acute distress. Nontoxic in appearance, well developed, well nourished. Pleasant caucasian female.  HENT: AT. [place]. Bilateral TM visualized with air fluid levels. MMM, no oral lesions. Bilateral nares with mild erythema and swelling. Throat without erythema or exudates.  Cough and hoarseness present.  Eyes:Pupils Equal Round Reactive to light, Extraocular movements intact,  Conjunctiva without redness, discharge or icterus. Neck/lymp/endocrine: Supple,no lymphadenopathy CV: RRR Chest: CTAB, no wheeze or crackles. Good air movement, normal resp effort.  Abd: Soft. NTND. BS present.  Skin: no rashes, purpura or petechiae.  Neuro:  Normal gait. PERLA. EOMi. Alert. Oriented x3  Psych: Normal affect, dress and demeanor. Normal speech. Normal thought content and judgment.  Assessment/Plan: Olivia [name] is a 55 y.o. female present for acute OV for  Acute maxillary sinusitis, recurrence not specified doxycyline BID, hycodan cough syrup (night time).  Flonase and mucinex DM Rest and hydrate.  F/U PRN  electronically signed by:  Howard Pouch, DO  [place]

## 2015-11-27 NOTE — Patient Instructions (Signed)
Start doxycyline every 12 hours for 10 days.  Start flonase daily for at least 2-4 weeks.  Prescribed cough syrup for night time only (sedation is common). Mucinex DM (the DM is robitussin- cough Supressant) for the day.  Rest and hydrate.    Sinusitis, Adult Sinusitis is redness, soreness, and inflammation of the paranasal sinuses. Paranasal sinuses are air pockets within the bones of your face. They are located beneath your eyes, in the middle of your forehead, and above your eyes. In healthy paranasal sinuses, mucus is able to drain out, and air is able to circulate through them by way of your nose. However, when your paranasal sinuses are inflamed, mucus and air can become trapped. This can allow bacteria and other germs to grow and cause infection. Sinusitis can develop quickly and last only a short time (acute) or continue over a long period (chronic). Sinusitis that lasts for more than 12 weeks is considered chronic. CAUSES Causes of sinusitis include:  Allergies.  Structural abnormalities, such as displacement of the cartilage that separates your nostrils (deviated septum), which can decrease the air flow through your nose and sinuses and affect sinus drainage.  Functional abnormalities, such as when the small hairs (cilia) that line your sinuses and help remove mucus do not work properly or are not present. SIGNS AND SYMPTOMS Symptoms of acute and chronic sinusitis are the same. The primary symptoms are pain and pressure around the affected sinuses. Other symptoms include:  Upper toothache.  Earache.  Headache.  Bad breath.  Decreased sense of smell and taste.  A cough, which worsens when you are lying flat.  Fatigue.  Fever.  Thick drainage from your nose, which often is green and may contain pus (purulent).  Swelling and warmth over the affected sinuses. DIAGNOSIS Your health care provider will perform a physical exam. During your exam, your health care provider may  perform any of the following to help determine if you have acute sinusitis or chronic sinusitis:  Look in your nose for signs of abnormal growths in your nostrils (nasal polyps).  Tap over the affected sinus to check for signs of infection.  View the inside of your sinuses using an imaging device that has a light attached (endoscope). If your health care provider suspects that you have chronic sinusitis, one or more of the following tests may be recommended:  Allergy tests.  Nasal culture. A sample of mucus is taken from your nose, sent to a lab, and screened for bacteria.  Nasal cytology. A sample of mucus is taken from your nose and examined by your health care provider to determine if your sinusitis is related to an allergy. TREATMENT Most cases of acute sinusitis are related to a viral infection and will resolve on their own within 10 days. Sometimes, medicines are prescribed to help relieve symptoms of both acute and chronic sinusitis. These may include pain medicines, decongestants, nasal steroid sprays, or saline sprays. However, for sinusitis related to a bacterial infection, your health care provider will prescribe antibiotic medicines. These are medicines that will help kill the bacteria causing the infection. Rarely, sinusitis is caused by a fungal infection. In these cases, your health care provider will prescribe antifungal medicine. For some cases of chronic sinusitis, surgery is needed. Generally, these are cases in which sinusitis recurs more than 3 times per year, despite other treatments. HOME CARE INSTRUCTIONS  Drink plenty of water. Water helps thin the mucus so your sinuses can drain more easily.  Use a  humidifier.  Inhale steam 3-4 times a day (for example, sit in the bathroom with the shower running).  Apply a warm, moist washcloth to your face 3-4 times a day, or as directed by your health care provider.  Use saline nasal sprays to help moisten and clean your  sinuses.  Take medicines only as directed by your health care provider.  If you were prescribed either an antibiotic or antifungal medicine, finish it all even if you start to feel better. SEEK IMMEDIATE MEDICAL CARE IF:  You have increasing pain or severe headaches.  You have nausea, vomiting, or drowsiness.  You have swelling around your face.  You have vision problems.  You have a stiff neck.  You have difficulty breathing.   This information is not intended to replace advice given to you by your health care provider. Make sure you discuss any questions you have with your health care provider.   Document Released: 01/20/2005 Document Revised: 02/10/2014 Document Reviewed: 02/04/2011 Elsevier Interactive Patient Education [employer].

## 2016-01-03 DIAGNOSIS — H5213 Myopia, bilateral: Secondary | ICD-10-CM | POA: Diagnosis not present

## 2016-01-15 DIAGNOSIS — Z9189 Other specified personal risk factors, not elsewhere classified: Secondary | ICD-10-CM | POA: Diagnosis not present

## 2016-01-15 DIAGNOSIS — Z636 Dependent relative needing care at home: Secondary | ICD-10-CM | POA: Diagnosis not present

## 2016-01-15 DIAGNOSIS — F4320 Adjustment disorder, unspecified: Secondary | ICD-10-CM | POA: Diagnosis not present

## 2016-02-04 DIAGNOSIS — C50919 Malignant neoplasm of unspecified site of unspecified female breast: Secondary | ICD-10-CM

## 2016-02-04 HISTORY — DX: Malignant neoplasm of unspecified site of unspecified female breast: C50.919

## 2016-03-03 DIAGNOSIS — J069 Acute upper respiratory infection, unspecified: Secondary | ICD-10-CM | POA: Diagnosis not present

## 2016-03-04 ENCOUNTER — Encounter: Payer: Self-pay | Admitting: Family Medicine

## 2016-03-04 ENCOUNTER — Ambulatory Visit (INDEPENDENT_AMBULATORY_CARE_PROVIDER_SITE_OTHER): Payer: BLUE CROSS/BLUE SHIELD | Admitting: Family Medicine

## 2016-03-04 VITALS — BP 120/72 | HR 90 | Temp 102.6°F | Resp 16 | Wt 142.0 lb

## 2016-03-04 DIAGNOSIS — J101 Influenza due to other identified influenza virus with other respiratory manifestations: Secondary | ICD-10-CM

## 2016-03-04 DIAGNOSIS — R6889 Other general symptoms and signs: Secondary | ICD-10-CM

## 2016-03-04 LAB — POC INFLUENZA A&B (BINAX/QUICKVUE)
Influenza A, POC: POSITIVE — AB
Influenza B, POC: NEGATIVE

## 2016-03-04 MED ORDER — OSELTAMIVIR PHOSPHATE 75 MG PO CAPS: 75.0000 mg | ORAL_CAPSULE | Freq: Two times a day (BID) | ORAL | 0 refills | Status: DC

## 2016-03-04 NOTE — Progress Notes (Signed)
Olivia [name] , April 19, 1960, 56 y.o., female MRN: [id number] Patient Care Team    Relationship Specialty Notifications Start End  Ma Hillock, DO PCP - General Family Medicine  12/26/14     CC: Olivia [name] Subjective: Pt presents for an acute OV with complaints of mylagia of 2 days duration.  Associated symptoms include cough, sore throat, nasal congestion, fever of 103F, fatigue. Pt  Has had her flu shot this year. She has not been exposed to known illness. She has tried advil and cough syrup provided by urgent care yesterday. She endorses mild nausea, but tolerating PO.   No Known Allergies Social History  Substance Use Topics  . Smoking status: Never Smoker  . Smokeless tobacco: Never Used  . Alcohol use 0.0 oz/week     Comment: Rare   Past Medical History:  Diagnosis Date  . Breast cancer ([place])    lumpectomy; chemotherapy and radiation  . Heart murmur    as a child - Echo in early 20s "ok"  . History of echocardiogram    Echo 4/17: EF 55-60%, GLS -19.6%, normal wall motion  . History of exercise stress test    ETT 4/17 - No ST changes  . HLD (hyperlipidemia)   . Hypothyroidism   . Osteoporosis    Past Surgical History:  Procedure Laterality Date  . BREAST SURGERY  2003 /right   lumpectomy; chemotherapy and radiation  . TOTAL ABDOMINAL HYSTERECTOMY W/ BILATERAL SALPINGOOPHORECTOMY     for treatment of breast treatment   Family History  Problem Relation Age of Onset  . Diabetes Other     1ST DEGREE RELATIVE  . Cancer Other     PROSTATE...1ST DEGREE RELATIVE  . Coronary artery disease Mother     1ST DEGREE RELATIVE  . Heart attack Mother 6  . Diabetes Father   . [name] disease Father   . Prostate cancer Father    Allergies as of 03/04/2016   No Known Allergies     Medication List       Accurate as of 03/04/16  4:25 PM. Always use your most recent med list.          Calcium Carb-Cholecalciferol 600-800 MG-UNIT Tabs Take 1 tablet by mouth daily.     chlorpheniramine-HYDROcodone 10-8 MG/5ML Suer Commonly known as:  TUSSIONEX Take 5 mLs by mouth 2 (two) times daily as needed.   denosumab 60 MG/ML Soln injection Commonly known as:  PROLIA Inject 60 mg into the skin every 6 (six) months. Administer in upper arm, thigh, or abdomen   Fish Oil 1200 MG Caps Take by mouth.   meclizine 25 MG tablet Commonly known as:  ANTIVERT Take 25 mg by mouth 3 (three) times daily as needed for dizziness.   multivitamin with minerals tablet Take 1 tablet by mouth daily.   omeprazole 20 MG capsule Commonly known as:  PRILOSEC Take 1 capsule (20 mg total) by mouth 2 (two) times daily.        ROS: Negative, with the exception of above mentioned in HPI   Objective:  BP 120/72 (BP Location: Left Arm, Patient Position: Sitting, Cuff Size: Normal)   Pulse 90   Temp (!) 102.6 F (39.2 C) (Temporal)   Resp 16   Wt 142 lb (64.4 kg)   SpO2 96%   BMI 24.37 kg/m  Body mass index is 24.37 kg/m. Gen: Afebrile. No acute distress. Nontoxic in appearance, well developed, well nourished. Appears fatiued.  HENT: AT. [place]. Bilateral  TM visualized bilateral air fluid levels. . MMM, no oral lesions. Bilateral nares erythema, drainage. Throat without erythema or exudates. Cough present.  Eyes:Pupils Equal Round Reactive to light, Extraocular movements intact,  Conjunctiva without redness, discharge or icterus. Neck/lymp/endocrine: Supple,mild ant cervical lymphadenopathy CV: RRR  Chest: CTAB, no wheeze or crackles. Good air movement, normal resp effort.  Abd: Soft. NTND. BS present.    Results for orders placed or performed in visit on 03/04/16 (from the past 24 hour(s))  POC Influenza A&B(BINAX/QUICKVUE)     Status: Abnormal   Collection Time: 03/04/16  4:24 PM  Result Value Ref Range   Influenza A, POC Positive (A) Negative   Influenza B, POC Negative Negative    Assessment/Plan: Olivia [name] is a 56 y.o. female present for acute OV for   Flu-like symptoms - POC Influenza A&B(BINAX/QUICKVUE) Influenza A positive Rest, hydrate, mucinex DM, tylenol/advil Tamiflu prescribed.  Continue cough syrup PRN F/U 1 week if symptoms are not improving.    electronically signed by:  Howard Pouch, DO  [place]

## 2016-03-04 NOTE — Patient Instructions (Signed)
Rest, hydrate  mucinex DM Advil/tylenol alternate.  Tamiflu every 12 hours for 5 days.   Stay home, rest.    Influenza, Adult Influenza, more commonly known as "the flu," is a viral infection that primarily affects the respiratory tract. The respiratory tract includes organs that help you breathe, such as the lungs, nose, and throat. The flu causes many common cold symptoms, as well as a high fever and body aches. The flu spreads easily from person to person (is contagious). Getting a flu shot (influenza vaccination) every year is the best way to prevent influenza. What are the causes? Influenza is caused by a virus. You can catch the virus by:  Breathing in droplets from an infected person's cough or sneeze.  Touching something that was recently contaminated with the virus and then touching your mouth, nose, or eyes. What increases the risk? The following factors may make you more likely to get the flu:  Not cleaning your hands frequently with soap and water or alcohol-based hand sanitizer.  Having close contact with many people during cold and flu season.  Touching your mouth, eyes, or nose without washing or sanitizing your hands first.  Not drinking enough fluids or not eating a healthy diet.  Not getting enough sleep or exercise.  Being under a high amount of stress.  Not getting a yearly (annual) flu shot. You may be at a higher risk of complications from the flu, such as a severe lung infection (pneumonia), if you:  Are over the age of 45.  Are pregnant.  Have a weakened disease-fighting system (immune system). You may have a weakened immune system if you:  Have HIV or AIDS.  Are undergoing chemotherapy.  Aretaking medicines that reduce the activity of (suppress) the immune system.  Have a long-term (chronic) illness, such as heart disease, kidney disease, diabetes, or lung disease.  Have a liver disorder.  Are obese.  Have anemia. What are the signs or  symptoms? Symptoms of this condition typically last 4-10 days and may include:  Fever.  Chills.  Headache, body aches, or muscle aches.  Sore throat.  Cough.  Runny or congested nose.  Chest discomfort and cough.  Poor appetite.  Weakness or tiredness (fatigue).  Dizziness.  Nausea or vomiting. How is this diagnosed? This condition may be diagnosed based on your medical history and a physical exam. Your health care provider may do a nose or throat swab test to confirm the diagnosis. How is this treated? If influenza is detected early, you can be treated with antiviral medicine that can reduce the length of your illness and the severity of your symptoms. This medicine may be given by mouth (orally) or through an IV tube that is inserted in one of your veins. The goal of treatment is to relieve symptoms by taking care of yourself at home. This may include taking over-the-counter medicines, drinking plenty of fluids, and adding humidity to the air in your home. In some cases, influenza goes away on its own. Severe influenza or complications from influenza may be treated in a hospital. Follow these instructions at home:  Take over-the-counter and prescription medicines only as told by your health care provider.  Use a cool mist humidifier to add humidity to the air in your home. This can make breathing easier.  Rest as needed.  Drink enough fluid to keep your urine clear or pale yellow.  Cover your mouth and nose when you cough or sneeze.  Wash your hands with soap  and water often, especially after you cough or sneeze. If soap and water are not available, use hand sanitizer.  Stay home from work or school as told by your health care provider. Unless you are visiting your health care provider, try to avoid leaving home until your fever has been gone for 24 hours without the use of medicine.  Keep all follow-up visits as told by your health care provider. This is  important. How is this prevented?  Getting an annual flu shot is the best way to avoid getting the flu. You may get the flu shot in late summer, fall, or winter. Ask your health care provider when you should get your flu shot.  Wash your hands often or use hand sanitizer often.  Avoid contact with people who are sick during cold and flu season.  Eat a healthy diet, drink plenty of fluids, get enough sleep, and exercise regularly. Contact a health care provider if:  You develop new symptoms.  You have:  Chest pain.  Diarrhea.  A fever.  Your cough gets worse.  You produce more mucus.  You feel nauseous or you vomit. Get help right away if:  You develop shortness of breath or difficulty breathing.  Your skin or nails turn a bluish color.  You have severe pain or stiffness in your neck.  You develop a sudden headache or sudden pain in your face or ear.  You cannot stop vomiting. This information is not intended to replace advice given to you by your health care provider. Make sure you discuss any questions you have with your health care provider. Document Released: 01/18/2000 Document Revised: 06/28/2015 Document Reviewed: 11/14/2014 Elsevier Interactive Patient Education  2017 [employer].

## 2016-03-20 ENCOUNTER — Telehealth: Payer: Self-pay | Admitting: Family Medicine

## 2016-03-20 NOTE — Telephone Encounter (Signed)
Contacted patient.  I did not do her prolia verification last year and was unaware she had a prolia injection scheduled for tomorrow 03/21/16 until yesterday.  I tried to do authorization but I wanted to verify patient still had [employer].  Awaiting verification.  Told patient I would call her late today if I needed to reschedule her prolia appt.  Patient expressed understanding.

## 2016-03-20 NOTE — Telephone Encounter (Signed)
Patient's insurance verification also needs PA.  Contacted [employer] to get PA started and rescheduled patient to next Friday 03/28/16.

## 2016-03-20 NOTE — Telephone Encounter (Signed)
Patient is returning your call re: prolia injection. I did not see any notes to advise her. She states she can be reached all day @ her mobile #

## 2016-03-21 ENCOUNTER — Ambulatory Visit: Payer: BLUE CROSS/BLUE SHIELD

## 2016-03-25 NOTE — Telephone Encounter (Signed)
PA faxed to [facility].   Awaiting approval.

## 2016-03-26 NOTE — Telephone Encounter (Signed)
PA approved.  Pt has appointment scheduled for prolia 03/28/16.

## 2016-03-28 ENCOUNTER — Ambulatory Visit (INDEPENDENT_AMBULATORY_CARE_PROVIDER_SITE_OTHER): Payer: BLUE CROSS/BLUE SHIELD | Admitting: Family Medicine

## 2016-03-28 DIAGNOSIS — M810 Age-related osteoporosis without current pathological fracture: Secondary | ICD-10-CM

## 2016-03-28 MED ORDER — DENOSUMAB 60 MG/ML ~~LOC~~ SOLN
60.0000 mg | Freq: Once | SUBCUTANEOUS | Status: AC
  Administered 2016-03-28: 60 mg via SUBCUTANEOUS

## 2016-03-28 NOTE — Progress Notes (Signed)
Medical screening examination/treatment/procedure(s) were performed by non-physician practitioner and as supervising physician I was immediately available for consultation/collaboration.  I agree with above assessment and plan.  Electronically Signed by: Chelsae Zanella, DO [facility] [place]   

## 2016-04-07 DIAGNOSIS — L309 Dermatitis, unspecified: Secondary | ICD-10-CM | POA: Diagnosis not present

## 2016-05-06 DIAGNOSIS — D2271 Melanocytic nevi of right lower limb, including hip: Secondary | ICD-10-CM | POA: Diagnosis not present

## 2016-05-06 DIAGNOSIS — L570 Actinic keratosis: Secondary | ICD-10-CM | POA: Diagnosis not present

## 2016-07-07 DIAGNOSIS — Z01419 Encounter for gynecological examination (general) (routine) without abnormal findings: Secondary | ICD-10-CM | POA: Diagnosis not present

## 2016-07-07 DIAGNOSIS — Z6823 Body mass index (BMI) 23.0-23.9, adult: Secondary | ICD-10-CM | POA: Diagnosis not present

## 2016-07-07 LAB — HM PAP SMEAR: HM Pap smear: NEGATIVE

## 2016-08-12 ENCOUNTER — Other Ambulatory Visit: Payer: Self-pay | Admitting: Obstetrics and Gynecology

## 2016-08-12 DIAGNOSIS — Z1231 Encounter for screening mammogram for malignant neoplasm of breast: Secondary | ICD-10-CM

## 2016-08-26 ENCOUNTER — Encounter: Payer: Self-pay | Admitting: Family Medicine

## 2016-08-26 ENCOUNTER — Ambulatory Visit (INDEPENDENT_AMBULATORY_CARE_PROVIDER_SITE_OTHER): Payer: BLUE CROSS/BLUE SHIELD | Admitting: Family Medicine

## 2016-08-26 VITALS — BP 121/81 | HR 61 | Temp 98.4°F | Resp 20 | Wt 138.0 lb

## 2016-08-26 DIAGNOSIS — K644 Residual hemorrhoidal skin tags: Secondary | ICD-10-CM

## 2016-08-26 MED ORDER — HYDROCORTISONE 2.5 % RE CREA: 1.0000 "application " | TOPICAL_CREAM | Freq: Two times a day (BID) | RECTAL | 0 refills | Status: DC

## 2016-08-26 MED ORDER — HYDROCORTISONE ACETATE 25 MG RE SUPP: 25.0000 mg | Freq: Two times a day (BID) | RECTAL | 0 refills | Status: DC

## 2016-08-26 NOTE — Patient Instructions (Signed)
Formed but soft stools.  Higher fiber and water content in diet.   Use suppository and cream for next few days to help decrease swelling.   About Hemorrhoids  Hemorrhoids are swollen veins in the lower rectum and anus.  Also called piles, hemorrhoids are a common problem.  Hemorrhoids may be internal (inside the rectum) or external (around the anus).  Internal Hemorrhoids  Internal hemorrhoids are often painless, but they rarely cause bleeding.  The internal veins may stretch and fall down (prolapse) through the anus to the outside of the body.  The veins may then become irritated and painful.  External Hemorrhoids  External hemorrhoids can be easily seen or felt around the anal opening.  They are under the skin around the anus.  When the swollen veins are scratched or broken by straining, rubbing or wiping they sometimes bleed.  How Hemorrhoids Occur  Veins in the rectum and around the anus tend to swell under pressure.  Hemorrhoids can result from increased pressure in the veins of your anus or rectum.  Some sources of pressure are:   Straining to have a bowel movement because of constipation  Waiting too long to have a bowel movement  Coughing and sneezing often  Sitting for extended periods of time, including on the toilet  Diarrhea  Obesity  Trauma or injury to the anus  Some liver diseases  Stress  Family history of hemorrhoids  Pregnancy  Pregnant women should try to avoid becoming constipated, because they are more likely to have hemorrhoids during pregnancy.  In the last trimester of pregnancy, the enlarged uterus may press on blood vessels and causes hemorrhoids.  In addition, the strain of childbirth sometimes causes hemorrhoids after the birth.  Symptoms of Hemorrhoids  Some symptoms of hemorrhoids include:  Swelling and/or a tender lump around the anus  Itching, mild burning and bleeding around the anus  Painful bowel movements with or without  constipation  Bright red blood covering the stool, on toilet paper or in the toilet bowel.   Symptoms usually go away within a few days.  Always talk to your doctor about any bleeding to make sure it is not from some other causes.  Diagnosing and Treating Hemorrhoids  Diagnosis is made by an examination by your healthcare provider.  Special test can be performed by your doctor.    Most cases of hemorrhoids can be treated with:  High-fiber diet: Eat more high-fiber foods, which help prevent constipation.  Ask for more detailed fiber information on types and sources of fiber from your healthcare provider.  Fluids: Drink plenty of water.  This helps soften bowel movements so they are easier to pass.  Sitz baths and cold packs: Sitting in lukewarm water two or three times a day for 15 minutes cleases the anal area and may relieve discomfort.  If the water is too hot, swelling around the anus will get worse.  Placing a cloth-covered ice pack on the anus for ten minutes four times a day can also help reduce selling.  Gently pushing a prolapsed hemorrhoid back inside after the bath or ice pack can be helpful.  Medications: For mild discomfort, your healthcare provider may suggest over-the-counter pain medication or prescribe a cream or ointment for topical use.  The cream may contain witch hazel, zinc oxide or petroleum jelly.  Medicated suppositories are also a treatment option.  Always consult your doctor before applying medications or creams.  Procedures and surgeries: There are also a number of  procedures and surgeries to shrink or remove hemorrhoids in more serious cases.  Talk to your physician about these options.  You can often prevent hemorrhoids or keep them from becoming worse by maintaining a healthy lifestyle.  Eat a fiber-rich diet of fruits, vegetables and whole grains.  Also, drink plenty of water and exercise regularly.   2007, [employer] Doc.30

## 2016-08-26 NOTE — Progress Notes (Signed)
Olivia [name] , 01-16-1961, 56 y.o., female MRN: [id number] Patient Care Team    Relationship Specialty Notifications Start End  Ma Hillock, DO PCP - General Family Medicine  12/26/14     Chief Complaint  Patient presents with  . Blood In Stools     Subjective: Pt presents for an OV with complaints of rectal bleeding after BM this morning. She reports she has had hemorrhoids before, but never bleeding associated with them. She witnessed bright red blood in her toilet bowl this morning. She denies needing to straining to have the BM. She reports it is painful now, it  is stinging and has mild pressure feeling. Her prior hemorrhoids she has always used coconut oil. See has not tried anything because she was fearful from the blood. She denies fever, chills, nausea.   Depression screen PHQ 2/9 01/29/2012  Decreased Interest 0  Down, Depressed, Hopeless 0  PHQ - 2 Score 0    No Known Allergies Social History  Substance Use Topics  . Smoking status: Never Smoker  . Smokeless tobacco: Never Used  . Alcohol use 0.0 oz/week     Comment: Rare   Past Medical History:  Diagnosis Date  . Breast cancer ([place])    lumpectomy; chemotherapy and radiation  . Heart murmur    as a child - Echo in early 20s "ok"  . History of echocardiogram    Echo 4/17: EF 55-60%, GLS -19.6%, normal wall motion  . History of exercise stress test    ETT 4/17 - No ST changes  . HLD (hyperlipidemia)   . Hypothyroidism   . Osteoporosis    Past Surgical History:  Procedure Laterality Date  . BREAST SURGERY  2003 /right   lumpectomy; chemotherapy and radiation  . TOTAL ABDOMINAL HYSTERECTOMY W/ BILATERAL SALPINGOOPHORECTOMY     for treatment of breast treatment   Family History  Problem Relation Age of Onset  . Diabetes Other        1ST DEGREE RELATIVE  . Cancer Other        PROSTATE...1ST DEGREE RELATIVE  . Coronary artery disease Mother        1ST DEGREE RELATIVE  . Heart attack Mother 46  .  Diabetes Father   . Kidney disease Father   . Prostate cancer Father    Allergies as of 08/26/2016   No Known Allergies     Medication List       Accurate as of 08/26/16  3:17 PM. Always use your most recent med list.          Calcium Carb-Cholecalciferol 600-800 MG-UNIT Tabs Take 1 tablet by mouth daily.   denosumab 60 MG/ML Soln injection Commonly known as:  PROLIA Inject 60 mg into the skin every 6 (six) months. Administer in upper arm, thigh, or abdomen   Fish Oil 1200 MG Caps Take by mouth.   meclizine 25 MG tablet Commonly known as:  ANTIVERT Take 25 mg by mouth 3 (three) times daily as needed for dizziness.   multivitamin with minerals tablet Take 1 tablet by mouth daily.   omeprazole 20 MG capsule Commonly known as:  PRILOSEC Take 1 capsule (20 mg total) by mouth 2 (two) times daily.       All past medical history, surgical history, allergies, family history, immunizations andmedications were updated in the EMR today and reviewed under the history and medication portions of their EMR.     ROS: Negative, with the exception of above  mentioned in HPI   Objective:  BP 121/81 (BP Location: Left Arm, Patient Position: Sitting, Cuff Size: Normal)   Pulse 61   Temp 98.4 F (36.9 C)   Resp 20   Wt 138 lb (62.6 kg)   SpO2 99%   BMI 23.69 kg/m  Body mass index is 23.69 kg/m. Gen: Afebrile. No acute distress. Nontoxic in appearance, well developed, well nourished.  HENT: AT. Olivia [name]. MMM, Eyes:Pupils Equal Round Reactive to light, Extraocular movements intact,  Conjunctiva without redness, discharge or icterus. Female genitalia: Rectal: normal rectal, no masses, guaiac negative stool obtained, hemorrhoids: external non-thrombosed and sphincter tone normal   No exam data present No results found. No results found for this or any previous visit (from the past 24 hour(s)).  Assessment/Plan: Olivia [name] is a 56 y.o. female present for OV for  External  hemorrhoid - non-thrombosed, does not appear infected - formed but soft stools encouraged, Miralax as needed.  - anusol supp and cream application explained/prescribed.  - > water and fiber.  - hydrocortisone (ANUSOL-HC) 2.5 % rectal cream; Place 1 application rectally 2 (two) times daily.  Dispense: 30 g; Refill: 0 - hydrocortisone (ANUSOL-HC) 25 MG suppository; Place 1 suppository (25 mg total) rectally 2 (two) times daily.  Dispense: 12 suppository; Refill: 0 - f/u 2 weeks if no improvement.    Reviewed expectations re: course of current medical issues.  Discussed self-management of symptoms.  Outlined signs and symptoms indicating need for more acute intervention.  Patient verbalized understanding and all questions were answered.  Patient received an After-Visit Summary.    No orders of the defined types were placed in this encounter.    Note is dictated utilizing voice recognition software. Although note has been proof read prior to signing, occasional typographical errors still can be missed. If any questions arise, please do not hesitate to call for verification.   electronically signed by:  Howard Pouch, DO  [place]

## 2016-09-25 ENCOUNTER — Ambulatory Visit (INDEPENDENT_AMBULATORY_CARE_PROVIDER_SITE_OTHER): Payer: BLUE CROSS/BLUE SHIELD

## 2016-09-25 DIAGNOSIS — M810 Age-related osteoporosis without current pathological fracture: Secondary | ICD-10-CM

## 2016-09-25 MED ORDER — DENOSUMAB 60 MG/ML ~~LOC~~ SOLN
60.0000 mg | Freq: Once | SUBCUTANEOUS | Status: AC
  Administered 2016-09-25: 60 mg via SUBCUTANEOUS

## 2016-09-25 NOTE — Progress Notes (Addendum)
Patient presents today for Prolia injection. Given with no incidence or problems. Patient left with no complaints.  Medical screening examination/treatment/procedure(s) were performed by non-physician practitioner and as supervising physician I was immediately available for consultation/collaboration.  I agree with above assessment and plan.  Electronically Signed by: Howard Pouch, DO [facility]

## 2016-09-30 ENCOUNTER — Ambulatory Visit
Admission: RE | Admit: 2016-09-30 | Discharge: 2016-09-30 | Disposition: A | Discharge status: Home / Self Care | Payer: BLUE CROSS/BLUE SHIELD | Source: Ambulatory Visit | Attending: Obstetrics and Gynecology | Admitting: Obstetrics and Gynecology

## 2016-09-30 ENCOUNTER — Encounter: Payer: Self-pay | Admitting: Family Medicine

## 2016-09-30 DIAGNOSIS — R928 Other abnormal and inconclusive findings on diagnostic imaging of breast: Secondary | ICD-10-CM | POA: Insufficient documentation

## 2016-09-30 DIAGNOSIS — Z1231 Encounter for screening mammogram for malignant neoplasm of breast: Secondary | ICD-10-CM | POA: Diagnosis not present

## 2016-09-30 HISTORY — DX: Personal history of irradiation: Z92.3

## 2016-09-30 HISTORY — DX: Personal history of antineoplastic chemotherapy: Z92.21

## 2016-10-02 ENCOUNTER — Other Ambulatory Visit: Payer: Self-pay | Admitting: Obstetrics and Gynecology

## 2016-10-02 DIAGNOSIS — R928 Other abnormal and inconclusive findings on diagnostic imaging of breast: Secondary | ICD-10-CM

## 2016-10-08 ENCOUNTER — Other Ambulatory Visit: Payer: Self-pay | Admitting: Obstetrics and Gynecology

## 2016-10-08 ENCOUNTER — Ambulatory Visit
Admission: RE | Admit: 2016-10-08 | Discharge: 2016-10-08 | Disposition: A | Discharge status: Home / Self Care | Payer: BLUE CROSS/BLUE SHIELD | Source: Ambulatory Visit | Attending: Obstetrics and Gynecology | Admitting: Obstetrics and Gynecology

## 2016-10-08 DIAGNOSIS — R928 Other abnormal and inconclusive findings on diagnostic imaging of breast: Secondary | ICD-10-CM

## 2016-10-08 DIAGNOSIS — N6310 Unspecified lump in the right breast, unspecified quadrant: Secondary | ICD-10-CM

## 2016-10-08 DIAGNOSIS — R922 Inconclusive mammogram: Secondary | ICD-10-CM | POA: Diagnosis not present

## 2016-10-09 ENCOUNTER — Ambulatory Visit
Admission: RE | Admit: 2016-10-09 | Discharge: 2016-10-09 | Disposition: A | Discharge status: Home / Self Care | Payer: BLUE CROSS/BLUE SHIELD | Source: Ambulatory Visit | Attending: Obstetrics and Gynecology | Admitting: Obstetrics and Gynecology

## 2016-10-09 ENCOUNTER — Other Ambulatory Visit: Payer: Self-pay | Admitting: Obstetrics and Gynecology

## 2016-10-09 DIAGNOSIS — C50211 Malignant neoplasm of upper-inner quadrant of right female breast: Secondary | ICD-10-CM | POA: Diagnosis not present

## 2016-10-09 DIAGNOSIS — N6310 Unspecified lump in the right breast, unspecified quadrant: Secondary | ICD-10-CM

## 2016-10-09 DIAGNOSIS — N6312 Unspecified lump in the right breast, upper inner quadrant: Secondary | ICD-10-CM | POA: Diagnosis not present

## 2016-10-10 ENCOUNTER — Encounter: Payer: Self-pay | Admitting: *Deleted

## 2016-10-10 ENCOUNTER — Telehealth: Payer: Self-pay | Admitting: Hematology and Oncology

## 2016-10-10 NOTE — Telephone Encounter (Signed)
Confirmed appointment with patient for Breast Clinic 10/15/16 for the afternoon, will email patient intake form to [email or web address]

## 2016-10-13 NOTE — Progress Notes (Signed)
[place]  [phone] [phone] [place] Note   Patient Care Team: Ma Hillock, DO as PCP - General (Family Medicine) 10/15/2016  CHIEF COMPLAINTS/PURPOSE OF CONSULTATION:  Consultation for Malignant neoplasm of upper-inner quadrant of right breast  Oncology History   Cancer Staging Malignant neoplasm of upper-inner quadrant of right breast in female, estrogen receptor positive ([place]) Staging form: Breast, AJCC 8th Edition - Clinical stage from 10/09/2016: Stage IA (cT1c, cN0, cM0, G3, ER: Positive, PR: Positive, HER2: Negative) - Signed by Truitt Merle, MD on 10/14/2016       Malignant neoplasm of upper-inner quadrant of right breast in female, estrogen receptor positive ([place])   10/08/2016 Mammogram    Diagnostic mammogram and ultrasound showed an irregular mass in the right breast at 1 o'clock, 5 cm from the nipple measuring 1.6 x 1.4x 1.6 cm, axilla was negative for adenopathy.      10/09/2016 Receptors her2    Estrogen Receptor: 80%, POSITIVE, STRONG STAINING INTENSITY Progesterone Receptor: 30%, POSITIVE, STRONG STAINING INTENSITY Proliferation Marker Ki67: 35% HER2 NEGATIVE       10/09/2016 Initial Diagnosis    Malignant neoplasm of upper-inner quadrant of right breast in female, estrogen receptor positive ([place])      10/09/2016 Initial Biopsy    Diagnosis Breast, right, needle core biopsy, upper inner quadrant, 1:00 o'clock - INVASIVE DUCTAL CARCINOMA, G3       HISTORY OF PRESENTING ILLNESS: 10/15/16 Olivia [name] 56 y.o. female is here because of newly diagnosed Malignant neoplasm of upper-inner quadrant of right breast. She presents to the Breast Clinic with her mother and her sister.  This was found in her yearly exam. She did not notice any change in appetite, weight or skin or nipple appearence.   In the past she was diagnosed with Right Breast Cancer in 2003. She was treated by Dr. Truddie Coco. She had a lumpectomy  (3/7 positive lymph nodes), chemo, radiation, tamoxifen for 2 years and Arimidex for 3 years.   She also had SBO in 2005. On Tamoxifen she has joint aches that got better with Arimidex.  She does have osteoporosis that she takes prolia twice a year and has been on for 2 years. She was previously on Fosamax. She has not had a fracture. She has goiters but does not take medication. Her father had prostate cancer and her brother has MM.   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    GYN HISTORY  Menarchal: 17 LMP: 2004 Contraceptive: No HRT: No GP: G0P0   MEDICAL HISTORY:  Past Medical History:  Diagnosis Date  . Breast cancer ([place])    lumpectomy; chemotherapy and radiation  . Heart murmur    as a child - Echo in early 20s "ok"  . History of echocardiogram    Echo 4/17: EF 55-60%, GLS -19.6%, normal wall motion  . History of exercise stress test    ETT 4/17 - No ST changes  . HLD (hyperlipidemia)   . Hypothyroidism   . Osteoporosis   . Personal history of chemotherapy   . Personal history of radiation therapy     SURGICAL HISTORY: Past Surgical History:  Procedure Laterality Date  . BREAST LUMPECTOMY Right 2003  . BREAST SURGERY  2003 /right   lumpectomy; chemotherapy and radiation  . TOTAL ABDOMINAL HYSTERECTOMY W/ BILATERAL SALPINGOOPHORECTOMY     for treatment of breast treatment  SOCIAL HISTORY: Social History   Social History  . Marital status: Married    Spouse name: N/A  . Number of children: N/A  . Years of education: N/A   Occupational History  . Not on file.   Social History Main Topics  . Smoking status: Never Smoker  . Smokeless tobacco: Never Used  . Alcohol use 0.0 oz/week     Comment: Rare  . Drug use: No  . Sexual activity: Yes    Birth control/ protection: Surgical   Other Topics Concern  . Not on file   Social History Narrative   Married. Bobby.    Children: none. No pets.    Stay at home.    Wears her seatbelt. Smoke alarm in the home.    Feels safe in her relationships.    Exercise 5x week.     FAMILY HISTORY: Family History  Problem Relation Age of Onset  . Diabetes Other        1ST DEGREE RELATIVE  . Cancer Other        PROSTATE...1ST DEGREE RELATIVE  . Coronary artery disease Mother        1ST DEGREE RELATIVE  . Heart attack Mother 70  . Diabetes Father   . Kidney disease Father   . Prostate cancer Father   . Cancer Brother        multiple myeloma     ALLERGIES:  has No Known Allergies.  MEDICATIONS:  Current Outpatient Prescriptions  Medication Sig Dispense Refill  . Calcium Carb-Cholecalciferol 600-800 MG-UNIT TABS Take 1 tablet by mouth daily.    Marland Kitchen denosumab (PROLIA) 60 MG/ML SOLN injection Inject 60 mg into the skin every 6 (six) months. Administer in upper arm, thigh, or abdomen 1 Syringe 1  . meclizine (ANTIVERT) 25 MG tablet Take 25 mg by mouth 3 (three) times daily as needed for dizziness.    . hydrocortisone (ANUSOL-HC) 2.5 % rectal cream Place 1 application rectally 2 (two) times daily. (Patient not taking: Reported on 10/15/2016) 30 g 0  . hydrocortisone (ANUSOL-HC) 25 MG suppository Place 1 suppository (25 mg total) rectally 2 (two) times daily. (Patient not taking: Reported on 10/15/2016) 12 suppository 0  . Multiple Vitamins-Minerals (MULTIVITAMIN WITH MINERALS) tablet Take 1 tablet by mouth daily.    . Omega-3 Fatty Acids (FISH OIL) 1200 MG CAPS Take by mouth.    Marland Kitchen omeprazole (PRILOSEC) 20 MG capsule Take 1 capsule (20 mg total) by mouth 2 (two) times daily. (Patient not taking: Reported on 10/15/2016) 60 capsule 1   No current facility-administered medications for this visit.     REVIEW OF SYSTEMS:   Constitutional: Denies fevers, chills or abnormal night sweats Eyes: Denies blurriness of vision, double vision or watery eyes Ears, nose, mouth, throat, and face: Denies mucositis or sore  throat Respiratory: Denies cough, dyspnea or wheezes Cardiovascular: Denies palpitation, chest discomfort or lower extremity swelling Gastrointestinal:  Denies nausea, heartburn or change in bowel habits Skin: Denies abnormal skin rashes Lymphatics: Denies new lymphadenopathy or easy bruising Neurological:Denies numbness, tingling or new weaknesses Behavioral/Psych: Mood is stable, no new changes  All other systems were reviewed with the patient and are negative.   PHYSICAL EXAMINATION: ECOG PERFORMANCE STATUS: 0 - Asymptomatic  Vitals:   10/15/16 1241  BP: (!) 144/71  Pulse: 60  Resp: 18  Temp: 97.9 F (36.6 C)  SpO2: 100%   Filed Weights   10/15/16 1241  Weight: 137 lb 6.4 oz (62.3 kg)    GENERAL:alert,  no distress and comfortable SKIN: skin color, texture, turgor are normal, no rashes or significant lesions EYES: normal, conjunctiva are pink and non-injected, sclera clear OROPHARYNX:no exudate, no erythema and lips, buccal mucosa, and tongue normal  NECK: supple, thyroid normal size, non-tender, without nodularity LYMPH:  no palpable lymphadenopathy in the cervical, axillary or inguinal LUNGS: clear to auscultation and percussion with normal breathing effort HEART: regular rate & rhythm and no murmurs and no lower extremity edema ABDOMEN:abdomen soft, non-tender and normal bowel sounds Musculoskeletal:no cyanosis of digits and no clubbing  PSYCH: alert & oriented x 3 with fluent speech NEURO: no focal motor/sensory deficits Breast: (+) s/p right breast lumpectomy, incision in UOQ well-healed  (+) small 1.1 cm mass, 1: 00 of left breast with skin ecchymosis, slightly tender from biopsy   LABORATORY DATA:  I have reviewed the data as listed CBC Latest Ref Rng & Units 10/15/2016 04/18/2015 12/26/2014  WBC 3.9 - 10.3 10e3/uL 5.6 6.0 4.5  Hemoglobin 11.6 - 15.9 g/dL 14.4 14.6 15.5(H)  Hematocrit 34.8 - 46.6 % 43.1 42.7 46.7(H)  Platelets 145 - 400 10e3/uL 227 267 277.0     CMP Latest Ref Rng & Units 10/15/2016 04/18/2015 12/26/2014  Glucose 70 - 140 mg/dl 109 98 96  BUN 7.0 - 26.0 mg/dL 20.'7 17 17  '$ Creatinine 0.6 - 1.1 mg/dL 1.0 0.86 0.87  Sodium 136 - 145 mEq/L 141 139 143  Potassium 3.5 - 5.1 mEq/L 3.8 3.8 4.4  Chloride 101 - 111 mmol/L - 101 103  CO2 22 - 29 mEq/L '27 28 31  '$ Calcium 8.4 - 10.4 mg/dL 9.6 9.4 9.9  Total Protein 6.4 - 8.3 g/dL 7.5 - 7.5  Total Bilirubin 0.20 - 1.20 mg/dL 0.78 - 0.7  Alkaline Phos 40 - 150 U/L 50 - 66  AST 5 - 34 U/L 20 - 25  ALT 0 - 55 U/L 18 - 24   PATHOLOGY  Diagnosis 10/09/16 Breast, right, needle core biopsy, upper inner quadrant, 1:00 o'clock - INVASIVE DUCTAL CARCINOMA, SEE COMMENT. Microscopic Comment The carcinoma appears grade 3. Prognostic markers will be ordered. Dr. Lyndon Code has reviewed the case. The case was called to [facility] on 10/10/2016.   05/17/2001 Lumpectomy from prior breast cancer  FINAL DIAGNOSIS MICROSCOPIC EXAMINATION AND DIAGNOSIS 1. SENTINEL LYMPH NODE BIOPSY, RIGHT AXILLARY LYMPH NODE: ONE LYMPH NODE, POSITIVE FOR METASTATIC CARCINOMA (1/1) SEE COMMENT. 2. LUMPECTOMY, RIGHT BREAST: - INVASIVE MAMMARY CARCINOMA, NO SPECIAL TYPE (DUCTAL), 2.3 CM, MSBR GRADE 3/3. - DUCTAL CARCINOMA IN SITU, INTERMEDIATE GRADE. - EXTENSIVE INTRADUCTAL COMPONENT NEGATIVE (JCRT) - LYMPHOVASCULAR INVASION IDENTIFIED. - SEE COMMENT. 3. AXILLARY DISSECTION, RIGHT AXILLA: TWO OF SIX LYMPH NODES POSITIVE FOR METASTATIC TUMOR (2/6). SEE COMMENT. COMMENT 1. Tumor is seen within vessels adjacent to lymph node capsule. No definitive extension beyond capsule into soft tissues is identified. The largest extent of metastatic tumor is 0.9 cm. 2. Invasive tumor approaches to within 1 mm of the deep aspect of the specimen and 4 mm of both the anterior and superior aspects of the specimen. Despite these relatively close margins, it appears that the tumor has been completely  excised. Fragments of skeletal muscle are seen at the deep aspect of the specimen and tumor is not seen to extend into skeletal muscle. Ductal carcinoma in situ approaches to within 3 mm of the posterior margin and 5 mm of the anterior margin. Ductal carcinoma is confined to the area of invasive tumor in the sections examined. A breast prognostic  marker panel has already been issued for this tumor ([id number]). It shows that the invasive component is positive for estrogen and progesterone receptors and negative for Her 2 neu over expression is measures by the Herceptest method. For complete details please see the original report. 3. Extracapsular extension in not identified. The largest focus of metastatic tumor measures 1.2 cm. ONCOLOGY TABLE-BREAST, INCISIONAL/EXCISIONAL BIOPSY OR MASTECTOMY WITH LYMPH NODES 1. Maximum tumor size (cm): 2.3 cm 2. Margins: Invasive component, distance to closest margin: 83m, posterior In situ component, distance to closest margin: 334m posterior 3. Vascular/Lymphatic invasion: Identified 4. Histology, invasive component: No special type (ductal). 5. Grade, invasive component (Elston-Ellis modified Scarff-Bloom-Richardson): 3 of 3 Tubule formation grade: 3 of 3 Nuclear pleomorphism grade: 2 of 3 Mitotic grade: 3 of 3 6. % In situ: Less than 5 % of the measured component. 7. Histology, in situ component: Solid pattern 8. Grade, in situ component: Intermediate grade 9. Multicentric (separate tumors in different quadrants): No 10. Multifocal (separate tumors in same quadrant or biopsy): No 11. Axillary lymph nodes: #examined: 7 #with metastasis: 3 ITC (isolated tumor cells, < 0.96m34m 0 Micrometastasis (> 0.96mm96m 96mm)68m Metastasis > 2 mm: 3 Extracapsular extension: Not identified. 12. TNM Code: pT2, pN1, pMX   RADIOGRAPHIC STUDIES: I have personally reviewed the radiological images as listed and  agreed with the findings in the report. Mm Digital Screening Bilateral  Result Date: 09/30/2016 CLINICAL DATA:  Screening. Right lumpectomy in 2003. EXAM: DIGITAL SCREENING BILATERAL MAMMOGRAM WITH CAD COMPARISON:  Previous exam(s). ACR Breast Density Category c: The breast tissue is heterogeneously dense, which may obscure small masses. FINDINGS: In the right breast, a possible mass warrants further evaluation. In the left breast, no findings suspicious for malignancy. Images were processed with CAD. IMPRESSION: Further evaluation is suggested for possible mass in the right breast. RECOMMENDATION: Diagnostic mammogram and possibly ultrasound of the right breast. (Code:[id number]) The patient will be contacted regarding the findings, and additional imaging will be scheduled. BI-RADS CATEGORY  0: Incomplete. Need additional imaging evaluation and/or prior mammograms for comparison. Electronically Signed   By: ElizaNolon Nations   On: 09/30/2016 14:56   Us [employer] Uni Right Inc Axilla  Result Date: 10/08/2016 CLINICAL DATA:  The patient was called back for a right breast mass. EXAM: 2D DIGITAL DIAGNOSTIC RIGHT MAMMOGRAM WITH CAD AND ADJUNCT TOMO ULTRASOUND RIGHT BREAST COMPARISON:  Previous exam(s). ACR Breast Density Category c: The breast tissue is heterogeneously dense, which may obscure small masses. FINDINGS: There is a spiculated right breast mass at 1 o'clock in the right breast. Mammographic images were processed with CAD. On physical exam, no suspicious lumps are identified. Targeted ultrasound is performed, showing an irregular mass in the right breast at 1 o'clock, 5 cm from the nipple measuring 1.6 x 1.4 x 1.6 cm. This mass demonstrates internal blood flow and extends into close proximity with the chest wall. No axillary adenopathy. IMPRESSION: Suspicious new right breast mass.  No axillary adenopathy. RECOMMENDATION: Recommend ultrasound-guided biopsy of the new right breast mass. I have  discussed the findings and recommendations with the patient. Results were also provided in writing at the conclusion of the visit. If applicable, a reminder letter will be sent to the patient regarding the next appointment. BI-RADS CATEGORY  5: Highly suggestive of malignancy. Electronically Signed   By: DavidDorise BullionM.D   On: 10/08/2016 11:43   Mm Diag Breast Tomo Uni Right  Result Date: 10/08/2016 CLINICAL DATA:  The patient was called back for a right breast mass. EXAM: 2D DIGITAL DIAGNOSTIC RIGHT MAMMOGRAM WITH CAD AND ADJUNCT TOMO ULTRASOUND RIGHT BREAST COMPARISON:  Previous exam(s). ACR Breast Density Category c: The breast tissue is heterogeneously dense, which may obscure small masses. FINDINGS: There is a spiculated right breast mass at 1 o'clock in the right breast. Mammographic images were processed with CAD. On physical exam, no suspicious lumps are identified. Targeted ultrasound is performed, showing an irregular mass in the right breast at 1 o'clock, 5 cm from the nipple measuring 1.6 x 1.4 x 1.6 cm. This mass demonstrates internal blood flow and extends into close proximity with the chest wall. No axillary adenopathy. IMPRESSION: Suspicious new right breast mass.  No axillary adenopathy. RECOMMENDATION: Recommend ultrasound-guided biopsy of the new right breast mass. I have discussed the findings and recommendations with the patient. Results were also provided in writing at the conclusion of the visit. If applicable, a reminder letter will be sent to the patient regarding the next appointment. BI-RADS CATEGORY  5: Highly suggestive of malignancy. Electronically Signed   By: Dorise Bullion III M.D   On: 10/08/2016 11:43   Mm Clip Placement Right  Result Date: 10/09/2016 CLINICAL DATA:  Ultrasound-guided biopsy was performed of a suspicious right breast mass in the upper inner quadrant. EXAM: DIAGNOSTIC RIGHT MAMMOGRAM POST ULTRASOUND BIOPSY COMPARISON:  Previous exam(s). FINDINGS:  Mammographic images were obtained following ultrasound guided biopsy of an irregular mass in the 1 o'clock position of the right breast. A ribbon shaped biopsy clip is satisfactorily positioned within the mass. IMPRESSION: Satisfactory position of ribbon shaped biopsy clip. Final Assessment: Post Procedure Mammograms for Marker Placement Electronically Signed   By: Curlene Dolphin M.D.   On: 10/09/2016 16:39   [place] Rt Breast Bx W Loc Dev 1st Lesion Img Bx Spec US Guide  Addendum Date: 10/10/2016   ADDENDUM REPORT: 10/10/2016 09:58 ADDENDUM: Pathology revealed grade III invasive ductal carcinoma in the RIGHT breast. This was found to be concordant by Dr. Curlene Dolphin. Pathology results were discussed with the patient by telephone. The patient reported doing well after the biopsy. Post biopsy instructions and care were reviewed and questions were answered. The patient was encouraged to call [facility] for any additional concerns. The patient was referred to the [facility] at the [facility] on October 15, 2016. Pathology results reported by Susa Raring RN, BSN on 10/10/2016. Electronically Signed   By: Curlene Dolphin M.D.   On: 10/10/2016 09:58   Result Date: 10/10/2016 CLINICAL DATA:  Ultrasound-guided core needle biopsy is recommended of a suspicious mass in the upper inner quadrant of the right breast. The patient has a history of right breast cancer, treated in 2003. EXAM: ULTRASOUND GUIDED RIGHT BREAST CORE NEEDLE BIOPSY COMPARISON:  Previous exam(s). FINDINGS: I met with the patient and we discussed the procedure of ultrasound-guided biopsy, including benefits and alternatives. We discussed the high likelihood of a successful procedure. We discussed the risks of the procedure, including infection, bleeding, tissue injury, clip migration, and inadequate sampling. Informed written consent was given. The usual time-out protocol was  performed immediately prior to the procedure. Lesion quadrant: Upper inner quadrant Using sterile technique and 1% Lidocaine as local anesthetic, under direct ultrasound visualization, a 12 gauge spring-loaded device was used to perform biopsy of a hypoechoic irregular mass in the 1 o'clock position 5 cm from the nipple using a lateral to medial approach. At  the conclusion of the procedure a tissue marker clip was deployed into the biopsy cavity. Follow up 2 view mammogram was performed and dictated separately. IMPRESSION: Ultrasound guided biopsy of the right breast. No apparent complications. Electronically Signed: By: Curlene Dolphin M.D. On: 10/09/2016 16:23    ASSESSMENT & PLAN:  Olivia [name] is a 56 y.o. caucasian female with a history of Heart murmur, HLD, and osteoporosis.   1. Malignant neoplasm of upper-inner quadrant of right breast, invasive ductal carcinoma, c(T1c, N0, M0), Stage 1A, ER/PR: positive, HER2 negative, Grade 3 -We discussed her imaging findings and the biopsy results in great details. -Giving her prior history of breast cancer in the same breast in the radiation, she likely need a Mastectomy. She is agreeable with that. She was seen by Dr. Marlou Starks today and likely will proceed with bilateral mastectomy soon and follow with breast reconstruction. -I recommend a Oncotype Dx test on the surgical sample and we'll make a decision about adjuvant chemotherapy based on the Oncotype result. Written material of this test was given to her. She is young and fit, would be a good candidate for chemotherapy if her Oncotype recurrence score is high. -If her surgical sentinel lymph node node positive or not able to obtain, I recommend mammaprint for further risk stratification and guide adjuvant chemotherapy. -Giving the strong ER and PR expression in her postmenopausal status, I recommend adjuvant endocrine therapy with aromatase inhibitor, anstrozole or letrozole, for a total of 5-10 years to  reduce the risk of cancer recurrence. Potential benefits and side effects were discussed with patient and she is interested. -She was also seen by radiation oncologist Dr. Sondra Come  today. Due to her prior history of breast radiation, she will probably not a candidate for re-radiation, so mastectomy was recommended. -We also discussed the breast cancer surveillance after her surgery. She will continue annual screening mammogram, self exam, and a routine office visit with lab and exam with [place]. -I encouraged her to have healthy diet and exercise regularly.  -Lab reviewed with patient and CBC and CMP are within normal limits.    2. History of Right Breast Cancer, pT2N1M0, ER+/pr+/her2-, in 2003 -Treated by Dr. Eston Esters -s/p lumpectomy and axillary lymph node dissection (3/7 positive lymph nodes), chemo, radiation, tamoxifen for 2 years and Anostrozole for 3 years -total hysterectomy/BSO in 2006    3. Genetic Testing -Based on her young age of prior breast cancer she is eligible for Genetic testing  -Send Genetic Referral    4. Osteoporosis -After being on Arimidex for 3 years she developed osteoporosis.  -She was previously on Fosamax for 5 years. Now she is on Prolia injection for the past 2 years. She receives injections every 6 months.     PLAN: -Send Genetic Referral  -Oncotype/MammaPrint on her surgical sample -B/l mastectomy with Dr. Marlou Starks soon  -F/u 2-3 weeks after surgery   No orders of the defined types were placed in this encounter.   All questions were answered. The patient knows to call the clinic with any problems, questions or concerns. I spent 55 minutes counseling the patient face to face. The total time spent in the appointment was 60 minutes and more than 50% was on counseling.  This document serves as a record of services personally performed by Truitt Merle, MD. It was created on her behalf by Joslyn Devon, a trained medical scribe. The creation of this record is  based on the scribe's personal observations and the provider's statements to  them. This document has been checked and approved by the attending provider.     Truitt Merle, MD 10/15/2016 5:37 PM

## 2016-10-14 ENCOUNTER — Other Ambulatory Visit: Payer: Self-pay | Admitting: *Deleted

## 2016-10-14 DIAGNOSIS — Z170 Estrogen receptor positive status [ER+]: Secondary | ICD-10-CM | POA: Insufficient documentation

## 2016-10-14 DIAGNOSIS — C50211 Malignant neoplasm of upper-inner quadrant of right female breast: Secondary | ICD-10-CM | POA: Insufficient documentation

## 2016-10-15 ENCOUNTER — Other Ambulatory Visit (HOSPITAL_BASED_OUTPATIENT_CLINIC_OR_DEPARTMENT_OTHER): Payer: BLUE CROSS/BLUE SHIELD

## 2016-10-15 ENCOUNTER — Encounter: Payer: Self-pay | Admitting: Physical Therapy

## 2016-10-15 ENCOUNTER — Encounter: Payer: Self-pay | Admitting: Hematology

## 2016-10-15 ENCOUNTER — Ambulatory Visit: Payer: BLUE CROSS/BLUE SHIELD | Attending: General Surgery | Admitting: Physical Therapy

## 2016-10-15 ENCOUNTER — Ambulatory Visit (HOSPITAL_BASED_OUTPATIENT_CLINIC_OR_DEPARTMENT_OTHER): Payer: BLUE CROSS/BLUE SHIELD | Admitting: Hematology

## 2016-10-15 ENCOUNTER — Ambulatory Visit
Admission: RE | Admit: 2016-10-15 | Discharge: 2016-10-15 | Disposition: A | Discharge status: Home / Self Care | Payer: BLUE CROSS/BLUE SHIELD | Source: Ambulatory Visit | Attending: Radiation Oncology | Admitting: Radiation Oncology

## 2016-10-15 VITALS — BP 144/71 | HR 60 | Temp 97.9°F | Resp 18 | Wt 137.4 lb

## 2016-10-15 DIAGNOSIS — Z170 Estrogen receptor positive status [ER+]: Secondary | ICD-10-CM | POA: Insufficient documentation

## 2016-10-15 DIAGNOSIS — M810 Age-related osteoporosis without current pathological fracture: Secondary | ICD-10-CM | POA: Diagnosis not present

## 2016-10-15 DIAGNOSIS — R293 Abnormal posture: Secondary | ICD-10-CM

## 2016-10-15 DIAGNOSIS — Z953 Presence of xenogenic heart valve: Secondary | ICD-10-CM | POA: Diagnosis not present

## 2016-10-15 DIAGNOSIS — C50211 Malignant neoplasm of upper-inner quadrant of right female breast: Secondary | ICD-10-CM | POA: Insufficient documentation

## 2016-10-15 DIAGNOSIS — Z923 Personal history of irradiation: Secondary | ICD-10-CM | POA: Diagnosis not present

## 2016-10-15 LAB — COMPREHENSIVE METABOLIC PANEL
ALT: 18 U/L (ref 0–55)
ANION GAP: 9 meq/L (ref 3–11)
AST: 20 U/L (ref 5–34)
Albumin: 4.6 g/dL (ref 3.5–5.0)
Alkaline Phosphatase: 50 U/L (ref 40–150)
BUN: 20.7 mg/dL (ref 7.0–26.0)
CALCIUM: 9.6 mg/dL (ref 8.4–10.4)
CHLORIDE: 105 meq/L (ref 98–109)
CO2: 27 meq/L (ref 22–29)
Creatinine: 1 mg/dL (ref 0.6–1.1)
EGFR: 66 mL/min/{1.73_m2} — AB (ref >90)
Glucose: 109 mg/dl (ref 70–140)
POTASSIUM: 3.8 meq/L (ref 3.5–5.1)
Sodium: 141 mEq/L (ref 136–145)
Total Bilirubin: 0.78 mg/dL (ref 0.20–1.20)
Total Protein: 7.5 g/dL (ref 6.4–8.3)

## 2016-10-15 LAB — CBC WITH DIFFERENTIAL/PLATELET
BASO%: 0.5 % (ref 0.0–2.0)
Basophils Absolute: 0 10*3/uL (ref 0.0–0.1)
EOS%: 1 % (ref 0.0–7.0)
Eosinophils Absolute: 0.1 10*3/uL (ref 0.0–0.5)
HEMATOCRIT: 43.1 % (ref 34.8–46.6)
HGB: 14.4 g/dL (ref 11.6–15.9)
LYMPH#: 1.9 10*3/uL (ref 0.9–3.3)
LYMPH%: 34.2 % (ref 14.0–49.7)
MCH: 29.3 pg (ref 25.1–34.0)
MCHC: 33.3 g/dL (ref 31.5–36.0)
MCV: 87.9 fL (ref 79.5–101.0)
MONO#: 0.3 10*3/uL (ref 0.1–0.9)
MONO%: 6.2 % (ref 0.0–14.0)
NEUT#: 3.3 10*3/uL (ref 1.5–6.5)
NEUT%: 58.1 % (ref 38.4–76.8)
PLATELETS: 227 10*3/uL (ref 145–400)
RBC: 4.91 10*6/uL (ref 3.70–5.45)
RDW: 13.7 % (ref 11.2–14.5)
WBC: 5.6 10*3/uL (ref 3.9–10.3)

## 2016-10-15 NOTE — Patient Instructions (Signed)

## 2016-10-15 NOTE — Therapy (Addendum)
[place], [place], [address] Phone: [phone]   Fax:  [phone]  Physical Therapy Evaluation  Patient Details  Name: Olivia [name] MRN: [id number] Date of Birth: January 14, 1961 Referring Provider: Dr. Autumn Messing  Encounter Date: 10/15/2016      PT End of Session - 10/15/16 1708    Visit Number 1   Number of Visits 2   PT Start Time 1435   PT Stop Time 1500   PT Time Calculation (min) 25 min   Activity Tolerance Patient tolerated treatment well   Behavior During Therapy [facility] for tasks assessed/performed      Past Medical History:  Diagnosis Date  . Breast cancer ([place])    lumpectomy; chemotherapy and radiation  . Heart murmur    as a child - Echo in early 20s "ok"  . History of echocardiogram    Echo 4/17: EF 55-60%, GLS -19.6%, normal wall motion  . History of exercise stress test    ETT 4/17 - No ST changes  . HLD (hyperlipidemia)   . Hypothyroidism   . Osteoporosis   . Personal history of chemotherapy   . Personal history of radiation therapy     Past Surgical History:  Procedure Laterality Date  . BREAST LUMPECTOMY Right 2003  . BREAST SURGERY  2003 /right   lumpectomy; chemotherapy and radiation  . TOTAL ABDOMINAL HYSTERECTOMY W/ BILATERAL SALPINGOOPHORECTOMY     for treatment of breast treatment    There were no vitals filed for this visit.       Subjective Assessment - 10/15/16 1656    Subjective Patient reports she is here to be seen by her medical team for her newly diagnosed right breast cancer.   Patient is accompained by: Family member   Pertinent History Patient was diagnosed on 09/30/16 with right grade 3 invasive ductal carcinoma breast cancer. It measures 1.6 cm and is located in the upper inner quadrant. It is ER/PR positive and HER2 negative with a Ki67 of 35%. She has a history of right breast cancer from 2003 where she underwent a right lumpectomy with an axillary  lymph node dissection, chemotherapy, and radiation. 7 axillary nodes were removed to her knowledge.   Patient Stated Goals Reduce lymphedema risk and learn post op shoulder ROM HEP   Currently in Pain? No/denies            [facility] PT Assessment - 10/15/16 0001      Assessment   Medical Diagnosis Right breast cancer   Referring Provider Dr. Autumn Messing   Onset Date/Surgical Date 09/30/16   Hand Dominance Right   Prior Therapy none     Precautions   Precautions Other (comment)   Precaution Comments Active cancer; already at risk for right arm lymphedema from previous breast cancer.     Restrictions   Weight Bearing Restrictions No     Balance Screen   Has the patient fallen in the past 6 months No   Has the patient had a decrease in activity level because of a fear of falling?  No   Is the patient reluctant to leave their home because of a fear of falling?  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   Leisure Runs 3x/week, yoga 1x/wk, strength training 1x/wk     Cognition   Overall Cognitive Status Within Functional Limits for tasks assessed     Posture/Postural Control   Posture/Postural Control Postural limitations   Postural Limitations Rounded Shoulders;Forward head     ROM / Strength   AROM / PROM / Strength AROM;Strength     AROM   AROM Assessment Site Shoulder;Cervical   Right/Left Shoulder Right;Left   Right Shoulder Extension 52 Degrees   Right Shoulder Flexion 149 Degrees   Right Shoulder ABduction 166 Degrees   Right Shoulder Internal Rotation 79 Degrees   Right Shoulder External Rotation 90 Degrees   Left Shoulder Extension 67 Degrees   Left Shoulder Flexion 145 Degrees   Left  Shoulder ABduction 167 Degrees   Left Shoulder Internal Rotation 80 Degrees   Left Shoulder External Rotation 88 Degrees   Cervical Flexion WNL   Cervical Extension WNL   Cervical - Right Side Bend WNL   Cervical - Left Side Bend WNL   Cervical - Right Rotation WNL   Cervical - Left Rotation WNL     Strength   Overall Strength Within functional limits for tasks performed           LYMPHEDEMA/ONCOLOGY QUESTIONNAIRE - 10/15/16 1706      Type   Cancer Type Right breast cancer     Lymphedema Assessments   Lymphedema Assessments Upper extremities     Right Upper Extremity Lymphedema   10 cm Proximal to Olecranon Process 26.2 cm   Olecranon Process 24.5 cm   10 cm Proximal to Ulnar Styloid Process 21.9 cm   Just Proximal to Ulnar Styloid Process 15.4 cm   Across Hand at [employer] 18.5 cm   At [place] of 2nd Digit 6.1 cm     Left Upper Extremity Lymphedema   10 cm Proximal to Olecranon Process 27.1 cm   Olecranon Process 23.8 cm   10 cm Proximal to Ulnar Styloid Process 20.9 cm   Just Proximal to Ulnar Styloid Process 15.2 cm   Across Hand at [employer] 17.9 cm   At [place] of 2nd Digit 6 cm         Objective measurements completed on examination: See above findings.       Patient was instructed today in a home exercise program today for post op shoulder range of motion. These included active assist shoulder flexion in sitting, scapular retraction, wall walking with shoulder abduction, and hands behind head external rotation.  She was encouraged to do these twice a day, holding 3 seconds and repeating 5 times when permitted by her physician.         PT Education - 10/15/16 1707    Education provided Yes   Education Details Lymphedema risk reduction and post op shoulder ROM HEP   Person(s) Educated Patient;Parent(s)   Methods Explanation;Demonstration;Handout   Comprehension Returned demonstration;Verbalized understanding              Breast  Clinic Goals - 10/15/16 1712      Patient will be able to verbalize understanding of pertinent lymphedema risk reduction practices relevant to her diagnosis specifically related to skin care.   Time 1   Period Days   Status Achieved     Patient will be able to return demonstrate and/or verbalize understanding of the post-op home exercise program related to regaining shoulder range of motion.   Time 1  Period Days   Status Achieved     Patient will be able to verbalize understanding of the importance of attending the postoperative After Breast Cancer Class for further lymphedema risk reduction education and therapeutic exercise.   Time 1   Period Days   Status Achieved               Plan - 10/15/16 1709    Clinical Impression Statement Patient was diagnosed on 09/30/16 with right grade 3 invasive ductal carcinoma breast cancer. It measures 1.6 cm and is located in the upper inner quadrant. It is ER/PR positive and HER2 negative with a Ki67 of 35%. She has a history of right breast cancer from 2003 where she underwent a right lumpectomy with an axillary lymph node dissection, chemotherapy, and radiation. 7 axillary nodes were removed to her knowledge. Her multidisciplinary medical team met prior to her assessment to determine a recommended treatment plan. She is planning to have a bilateral mastectomy and right sentinel node biopsy if the surgeon is able to find nodes present as she previously had axillary surgery. She will have Oncotype testing and anti-estrogen therapy. She will benefit from post op PT to regain shoulder ROM and reduce her lymphedema risk.    History and Personal Factors relevant to plan of care: Previous breast cancer with treatment and high risk of right arm lymphedema.   Clinical Presentation Evolving   Clinical Presentation due to: Patient will soon undergo extensive breast surgery   Clinical Decision Making Moderate   Rehab Potential Excellent   Clinical  Impairments Affecting Rehab Potential None   PT Frequency --  Will f/u 3 weeks post op to determine PT needs   PT Treatment/Interventions Therapeutic exercise;Patient/family education   PT Next Visit Plan Will reassess 3 weeks post op to determine PT needs   PT Home Exercise Plan Post op shoulder ROM HEP   Consulted and Agree with Plan of Care Patient;Family member/caregiver   Family Member Consulted Mother and sister      Patient will benefit from skilled therapeutic intervention in order to improve the following deficits and impairments:  Postural dysfunction, Decreased knowledge of precautions, Pain, Impaired UE functional use, Decreased range of motion  Visit Diagnosis: Carcinoma of upper-inner quadrant of right breast in female, estrogen receptor positive ([place]) - Plan: PT plan of care cert/re-cert  Abnormal posture - Plan: PT plan of care cert/re-cert   Patient will follow up at outpatient cancer rehab if needed following surgery.  If the patient requires physical therapy at that time, a specific plan will be dictated and sent to the referring physician for approval. The patient was educated today on appropriate basic range of motion exercises to begin post operatively and the importance of attending the After Breast Cancer class following surgery.  Patient was educated today on lymphedema risk reduction practices as it pertains to recommendations that will benefit the patient immediately following surgery.  She verbalized good understanding.  No additional physical therapy is indicated at this time.      Problem List Patient Active Problem List   Diagnosis Date Noted  . Malignant neoplasm of upper-inner quadrant of right breast in female, estrogen receptor positive ([place]) 10/14/2016  . Abnormal mammogram 09/30/2016  . Chest pain 04/24/2015  . Encounter for preventive health examination 12/26/2014  . Multiple thyroid nodules 12/26/2014  . Screening for HIV (human immunodeficiency  virus) 12/26/2014  . Need for hepatitis C screening test 12/26/2014  . Osteoporosis 12/26/2014  . Abnormality of  gait 12/05/2014  . General medical examination 11/07/2010  . HYPOTHYROIDISM 11/17/2006  . Pain in joint, ankle and foot 11/17/2006  . UNEQUAL LEG LENGTH, ACQUIRED 11/17/2006  . BREAST CANCER, HX OF 11/17/2006   Annia Friendly, PT 10/15/16 5:15 PM  [place], [place], [address] Phone: [phone]   Fax:  [phone]  Name: Olivia [name] MRN: [id number] Date of Birth: [birth year]  PHYSICAL THERAPY DISCHARGE SUMMARY  Visits from Start of Care: 1  Current functional level related to goals / functional outcomes: Unknown; pt did not return to PT for f/u visit as she decided to undergo breast reconstruction.   Remaining deficits: Unknown   Education / Equipment: Post op HEP Plan: Patient agrees to discharge.  Patient goals were not met. Patient is being discharged due to not returning since the last visit.  ?????    Annia Friendly, [place] 03/02/17 4:42 PM

## 2016-10-15 NOTE — Progress Notes (Signed)
Nutrition Assessment  Reason for Assessment:  Pt seen in Breast Clinic  ASSESSMENT:   56 year old female with breast cancer.  Past medical history of right breast cancer in 2003, HLD  Patient reports normal appetite.  Medications:  reviewed  Labs: reviewed  Anthropometrics:   Height: 64 inches Weight: 137 lb 6.4 oz BMI: 23   NUTRITION DIAGNOSIS: Food and nutrition related knowledge deficit related to new diagnosis of breast cancer as evidenced by no prior need for nutrition related information.  INTERVENTION:   Discussed and provided packet of information regarding nutritional tips for breast cancer patients.  Questions answered.  Teachback method used.  Contact information provided and patient knows to contact me with questions/concerns.    MONITORING, EVALUATION, and GOAL: Pt will consume a healthy plant based diet to maintain lean body mass throughout treatment.   Olivia [name], [place], [place] Registered Dietitian [phone] (pager)

## 2016-10-15 NOTE — Progress Notes (Signed)
Radiation Oncology         [phone] ________________________________  Multidisciplinary Breast Oncology Clinic [facility]) Initial Outpatient Consultation  Name: Olivia [name] MRN: [id number]  Date: 10/15/2016  DOB: [birth year]  BS:[id number], Olivia Raddle, DO  Olivia Kussmaul, MD   REFERRING PHYSICIAN: Autumn Messing III, MD  DIAGNOSIS: The encounter diagnosis was Malignant neoplasm of upper-inner quadrant of right breast in female, estrogen receptor positive ([place]).    ICD-10-CM   1. Malignant neoplasm of upper-inner quadrant of right breast in female, estrogen receptor positive ([place]) C50.211    Z17.0     HISTORY OF PRESENT ILLNESS::Olivia [name] is a 56 y.o. female who is here with newly diagnosed malignant neoplasm of UIQ of right breast. Pt underwent right needle core biopsy on 10/09/2016 with results revealing: Diagnosis Breast, right, needle core biopsy, upper inner quadrant, 1:00 o'clock. INVASIVE DUCTAL CARCINOMA. Microscopic Comment The carcinoma appears grade 3. ER(+), PR(+), and Her2 (neg).   For the patient first occurrence of right breast cancer, she initially presented with a right mass and was dx with breast cancer in 2003. She had 7 lymphnodes removed with 3/7 positive. Pt was treated with radiation and chemotherapy.  The patient was referred today for presentation in the multidisciplinary conference.  Radiology studies and pathology slides were presented there for review and discussion of treatment options.  A consensus was discussed regarding potential next steps.  GYN HISTORY  Menarchal: 39 years old LMP: 2004, no longer having menstrual cycles Contraceptive: Negative HRT: Negative  GP: G0P0  PREVIOUS RADIATION THERAPY: Yes, right breast and locoregional area treated in 2003 by Dr. Elba Barman.   PAST MEDICAL HISTORY:  has a past medical history of Breast cancer ([place]); Heart murmur; History of echocardiogram; History of exercise stress test; HLD (hyperlipidemia); Hypothyroidism;  Osteoporosis; Personal history of chemotherapy; and Personal history of radiation therapy.    PAST SURGICAL HISTORY: Past Surgical History:  Procedure Laterality Date  . BREAST LUMPECTOMY Right 2003  . BREAST SURGERY  2003 /right   lumpectomy; chemotherapy and radiation  . TOTAL ABDOMINAL HYSTERECTOMY W/ BILATERAL SALPINGOOPHORECTOMY     for treatment of breast treatment    FAMILY HISTORY: family history includes Cancer in her brother and other; Coronary artery disease in her mother; Diabetes in her father and other; Heart attack (age of onset: 78) in her mother; Kidney disease in her father; Prostate cancer in her father.  SOCIAL HISTORY:  reports that she has never smoked. She has never used smokeless tobacco. She reports that she drinks alcohol. She reports that she does not use drugs.  ALLERGIES: Patient has no known allergies.  MEDICATIONS:  Current Outpatient Prescriptions  Medication Sig Dispense Refill  . Calcium Carb-Cholecalciferol 600-800 MG-UNIT TABS Take 1 tablet by mouth daily.    Marland Kitchen denosumab (PROLIA) 60 MG/ML SOLN injection Inject 60 mg into the skin every 6 (six) months. Administer in upper arm, thigh, or abdomen 1 Syringe 1  . hydrocortisone (ANUSOL-HC) 2.5 % rectal cream Place 1 application rectally 2 (two) times daily. (Patient not taking: Reported on 10/15/2016) 30 g 0  . hydrocortisone (ANUSOL-HC) 25 MG suppository Place 1 suppository (25 mg total) rectally 2 (two) times daily. (Patient not taking: Reported on 10/15/2016) 12 suppository 0  . meclizine (ANTIVERT) 25 MG tablet Take 25 mg by mouth 3 (three) times daily as needed for dizziness.    . Multiple Vitamins-Minerals (MULTIVITAMIN WITH MINERALS) tablet Take 1 tablet by mouth daily.    . Omega-3 Fatty Acids (  FISH OIL) 1200 MG CAPS Take by mouth.    Marland Kitchen omeprazole (PRILOSEC) 20 MG capsule Take 1 capsule (20 mg total) by mouth 2 (two) times daily. (Patient not taking: Reported on 10/15/2016) 60 capsule 1   No current  facility-administered medications for this encounter.     REVIEW OF SYSTEMS: A 10+ POINT REVIEW OF SYSTEMS WAS OBTAINED including neurology, dermatology, psychiatry, cardiac, respiratory, lymph, extremities, GI, GU, musculoskeletal, constitutional, reproductive, HEENT. All pertinent positives are noted in the HPI. All others are negative.  PHYSICAL EXAM:  Lungs are clear to auscultation bilaterally. Heart has regular rate and rhythm. No palpable cervical, supraclavicular, or axillary adenopathy. Abdomen soft, non-tender, normal bowel sounds. Left breast without palpable mass or nipple discharge. Right breast, pt has bruising to UIQ with a palpable mass measuring approximately 1.5 x 1.5 cm. Slight nipple retraction. Patient has minimal radiation change along the right breast, axillary, and supraclavicular region. Patient has tattoos in place along right chest from previous radiation therapy treatment. Scar to UOQ of breast from previous lumpectomy.   KPS = 90    LABORATORY DATA:  Lab Results  Component Value Date   WBC 5.6 10/15/2016   HGB 14.4 10/15/2016   HCT 43.1 10/15/2016   MCV 87.9 10/15/2016   PLT 227 10/15/2016   Lab Results  Component Value Date   NA 141 10/15/2016   K 3.8 10/15/2016   CL 101 04/18/2015   CO2 27 10/15/2016   Lab Results  Component Value Date   ALT 18 10/15/2016   AST 20 10/15/2016   ALKPHOS 50 10/15/2016   BILITOT 0.78 10/15/2016    PULMONARY FUNCTION TEST:   Recent Review Flowsheet Data    There is no flowsheet data to display.      RADIOGRAPHY: Mm Digital Screening Bilateral  Result Date: 09/30/2016 CLINICAL DATA:  Screening. Right lumpectomy in 2003. EXAM: DIGITAL SCREENING BILATERAL MAMMOGRAM WITH CAD COMPARISON:  Previous exam(s). ACR Breast Density Category c: The breast tissue is heterogeneously dense, which may obscure small masses. FINDINGS: In the right breast, a possible mass warrants further evaluation. In the left breast, no findings  suspicious for malignancy. Images were processed with CAD. IMPRESSION: Further evaluation is suggested for possible mass in the right breast. RECOMMENDATION: Diagnostic mammogram and possibly ultrasound of the right breast. (Code:[id number]) The patient will be contacted regarding the findings, and additional imaging will be scheduled. BI-RADS CATEGORY  0: Incomplete. Need additional imaging evaluation and/or prior mammograms for comparison. Electronically Signed   By: Nolon Nations M.D.   On: 09/30/2016 14:56   US Breast Ltd Uni Right Inc Axilla  Result Date: 10/08/2016 CLINICAL DATA:  The patient was called back for a right breast mass. EXAM: 2D DIGITAL DIAGNOSTIC RIGHT MAMMOGRAM WITH CAD AND ADJUNCT TOMO ULTRASOUND RIGHT BREAST COMPARISON:  Previous exam(s). ACR Breast Density Category c: The breast tissue is heterogeneously dense, which may obscure small masses. FINDINGS: There is a spiculated right breast mass at 1 o'clock in the right breast. Mammographic images were processed with CAD. On physical exam, no suspicious lumps are identified. Targeted ultrasound is performed, showing an irregular mass in the right breast at 1 o'clock, 5 cm from the nipple measuring 1.6 x 1.4 x 1.6 cm. This mass demonstrates internal blood flow and extends into close proximity with the chest wall. No axillary adenopathy. IMPRESSION: Suspicious new right breast mass.  No axillary adenopathy. RECOMMENDATION: Recommend ultrasound-guided biopsy of the new right breast mass. I have discussed the findings and  recommendations with the patient. Results were also provided in writing at the conclusion of the visit. If applicable, a reminder letter will be sent to the patient regarding the next appointment. BI-RADS CATEGORY  5: Highly suggestive of malignancy. Electronically Signed   By: Dorise Bullion III M.D   On: 10/08/2016 11:43   Mm Diag Breast Tomo Uni Right  Result Date: 10/08/2016 CLINICAL DATA:  The patient was called back  for a right breast mass. EXAM: 2D DIGITAL DIAGNOSTIC RIGHT MAMMOGRAM WITH CAD AND ADJUNCT TOMO ULTRASOUND RIGHT BREAST COMPARISON:  Previous exam(s). ACR Breast Density Category c: The breast tissue is heterogeneously dense, which may obscure small masses. FINDINGS: There is a spiculated right breast mass at 1 o'clock in the right breast. Mammographic images were processed with CAD. On physical exam, no suspicious lumps are identified. Targeted ultrasound is performed, showing an irregular mass in the right breast at 1 o'clock, 5 cm from the nipple measuring 1.6 x 1.4 x 1.6 cm. This mass demonstrates internal blood flow and extends into close proximity with the chest wall. No axillary adenopathy. IMPRESSION: Suspicious new right breast mass.  No axillary adenopathy. RECOMMENDATION: Recommend ultrasound-guided biopsy of the new right breast mass. I have discussed the findings and recommendations with the patient. Results were also provided in writing at the conclusion of the visit. If applicable, a reminder letter will be sent to the patient regarding the next appointment. BI-RADS CATEGORY  5: Highly suggestive of malignancy. Electronically Signed   By: Dorise Bullion III M.D   On: 10/08/2016 11:43   Mm Clip Placement Right  Result Date: 10/09/2016 CLINICAL DATA:  Ultrasound-guided biopsy was performed of a suspicious right breast mass in the upper inner quadrant. EXAM: DIAGNOSTIC RIGHT MAMMOGRAM POST ULTRASOUND BIOPSY COMPARISON:  Previous exam(s). FINDINGS: Mammographic images were obtained following ultrasound guided biopsy of an irregular mass in the 1 o'clock position of the right breast. A ribbon shaped biopsy clip is satisfactorily positioned within the mass. IMPRESSION: Satisfactory position of ribbon shaped biopsy clip. Final Assessment: Post Procedure Mammograms for Marker Placement Electronically Signed   By: Curlene Dolphin M.D.   On: 10/09/2016 16:39   [place] Rt Breast Bx W Loc Dev 1st Lesion Img Bx Spec  US Guide  Addendum Date: 10/10/2016   ADDENDUM REPORT: 10/10/2016 09:58 ADDENDUM: Pathology revealed grade III invasive ductal carcinoma in the RIGHT breast. This was found to be concordant by Dr. Curlene Dolphin. Pathology results were discussed with the patient by telephone. The patient reported doing well after the biopsy. Post biopsy instructions and care were reviewed and questions were answered. The patient was encouraged to call [facility] for any additional concerns. The patient was referred to the [facility] at the [facility] on October 15, 2016. Pathology results reported by Susa Raring RN, BSN on 10/10/2016. Electronically Signed   By: Curlene Dolphin M.D.   On: 10/10/2016 09:58   Result Date: 10/10/2016 CLINICAL DATA:  Ultrasound-guided core needle biopsy is recommended of a suspicious mass in the upper inner quadrant of the right breast. The patient has a history of right breast cancer, treated in 2003. EXAM: ULTRASOUND GUIDED RIGHT BREAST CORE NEEDLE BIOPSY COMPARISON:  Previous exam(s). FINDINGS: I met with the patient and we discussed the procedure of ultrasound-guided biopsy, including benefits and alternatives. We discussed the high likelihood of a successful procedure. We discussed the risks of the procedure, including infection, bleeding, tissue injury, clip migration,  and inadequate sampling. Informed written consent was given. The usual time-out protocol was performed immediately prior to the procedure. Lesion quadrant: Upper inner quadrant Using sterile technique and 1% Lidocaine as local anesthetic, under direct ultrasound visualization, a 12 gauge spring-loaded device was used to perform biopsy of a hypoechoic irregular mass in the 1 o'clock position 5 cm from the nipple using a lateral to medial approach. At the conclusion of the procedure a tissue marker clip was deployed into the biopsy cavity. Follow up 2 view  mammogram was performed and dictated separately. IMPRESSION: Ultrasound guided biopsy of the right breast. No apparent complications. Electronically Signed: By: Curlene Dolphin M.D. On: 10/09/2016 16:23      IMPRESSION: Clinical stage T1c Invasive ductal carcinoma of right breast. Patient likely has developed new cancer in previously radiated breast. Given her full dose of radiation to right breast, she will not be a candidate for radiation. Given young age, wouldn't recommend lumpectomy alone. Recommended right mastectomy along with attempted sentinel node biopsy. Given personal hx of 2 breast cancers, we also recommend genetics. patient will make a decision concerning this issue soon.    PLAN: She will proceed with Oncotype Dx testing after surgery as well as adjuvant hormonal therapy.    ------------------------------------------------  Blair Promise, PhD, MD   This document serves as a record of services personally performed by Gery Pray, MD. It was created on her behalf by [facility], a trained medical scribe. The creation of this record is based on the scribe's personal observations and the provider's statements to them. This document has been checked and approved by the attending provider.

## 2016-10-16 ENCOUNTER — Telehealth: Payer: Self-pay | Admitting: Hematology

## 2016-10-16 ENCOUNTER — Ambulatory Visit: Payer: Self-pay | Admitting: General Surgery

## 2016-10-16 NOTE — Telephone Encounter (Signed)
No 9/12 los.  

## 2016-10-20 ENCOUNTER — Encounter: Payer: Self-pay | Admitting: General Practice

## 2016-10-20 ENCOUNTER — Telehealth: Payer: Self-pay | Admitting: *Deleted

## 2016-10-20 NOTE — Progress Notes (Signed)
[place] Psychosocial Distress Screening [place] presented to [facility] on 10/15/16.   The patient scored a 6 on the Psychosocial Distress Thermometer which indicates moderate distress.    ONCBCN DISTRESS SCREENING 10/20/2016  Screening Type Initial Screening  Distress experienced in past week (1-10) 6  Emotional problem type Adjusting to illness  Referral to support programs Yes    Attempted to reach Ms Runyan by phone to assess for distress and other psychosocial needs.  Unable to reach pt by phone, I LVM encouraging her to return call.   She has full packet of [place] team/programming information.    Follow up needed: Yes.  Plan to f/u by phone if pt does not return call, but please always page if immediate needs arise.  Thank you.    [place], [place], [facility] Pager [phone] Voicemail [phone]

## 2016-10-20 NOTE — Telephone Encounter (Signed)
Left vm regarding [facility] from 9.12.18. Request return call. Contact information provided.

## 2016-10-23 ENCOUNTER — Encounter: Payer: Self-pay | Admitting: General Practice

## 2016-10-23 ENCOUNTER — Other Ambulatory Visit: Payer: BLUE CROSS/BLUE SHIELD

## 2016-10-23 NOTE — Progress Notes (Signed)
[place] Spiritual Care Note  Reached Laiya briefly by phone while she was on vacation in [place]! She appreciated the f/u gesture of care and is aware of [place] team/resources and knows to contact any of [place] with questions, needs, or interests.     [place], [place], [facility] Pager [phone] Voicemail [phone]

## 2016-10-28 DIAGNOSIS — C50211 Malignant neoplasm of upper-inner quadrant of right female breast: Secondary | ICD-10-CM | POA: Diagnosis not present

## 2016-10-28 DIAGNOSIS — Z170 Estrogen receptor positive status [ER+]: Secondary | ICD-10-CM | POA: Diagnosis not present

## 2016-10-30 ENCOUNTER — Ambulatory Visit (HOSPITAL_BASED_OUTPATIENT_CLINIC_OR_DEPARTMENT_OTHER): Payer: BLUE CROSS/BLUE SHIELD | Admitting: Genetic Counselor

## 2016-10-30 ENCOUNTER — Encounter: Payer: Self-pay | Admitting: Genetic Counselor

## 2016-10-30 ENCOUNTER — Other Ambulatory Visit: Payer: BLUE CROSS/BLUE SHIELD

## 2016-10-30 DIAGNOSIS — Z803 Family history of malignant neoplasm of breast: Secondary | ICD-10-CM | POA: Diagnosis not present

## 2016-10-30 DIAGNOSIS — C50211 Malignant neoplasm of upper-inner quadrant of right female breast: Secondary | ICD-10-CM | POA: Diagnosis not present

## 2016-10-30 DIAGNOSIS — Z7183 Encounter for nonprocreative genetic counseling: Secondary | ICD-10-CM | POA: Diagnosis not present

## 2016-10-30 DIAGNOSIS — Z808 Family history of malignant neoplasm of other organs or systems: Secondary | ICD-10-CM

## 2016-10-30 DIAGNOSIS — Z809 Family history of malignant neoplasm, unspecified: Secondary | ICD-10-CM | POA: Diagnosis not present

## 2016-10-30 DIAGNOSIS — Z1379 Encounter for other screening for genetic and chromosomal anomalies: Secondary | ICD-10-CM

## 2016-10-30 DIAGNOSIS — Z853 Personal history of malignant neoplasm of breast: Secondary | ICD-10-CM | POA: Diagnosis not present

## 2016-10-30 HISTORY — DX: Encounter for other screening for genetic and chromosomal anomalies: Z13.79

## 2016-10-30 NOTE — Progress Notes (Signed)
[facility]      Initial Visit   Patient Name: Olivia [name] Patient DOB: [birth year] Patient Age: 56 y.o. Encounter Date: 10/30/2016  Referring Provider: Truitt Merle, MD  Primary Care Provider: Ma Hillock, DO  Reason for Visit: Evaluate for hereditary susceptibility to cancer    Assessment and Plan:  . [name] history of 2 breast primaries (one at age 61) is somewhat concerning, but her family history is not suggestive of a hereditary predisposition to cancer. She meets NCCN criteria for genetic testing.  . Testing is recommended to determine whether she has a pathogenic mutation that will potentiall impact her screening and risk-reduction for cancer. A negative result will be reassuring.  . [name] wished to pursue genetic testing and a blood sample will be sent for analysis of the 23 genes on [facility]'s Breast/GYN panel (ATM, BARD1, BRCA1, BRCA2, BRIP1, CDH1, CHEK2, DICER1, EPCAM, MLH1,  MSH2, MSH6, NBN, NF1, PALB2, PMS2, PTEN, RAD50, RAD51C, RAD51D,SMARCA4, STK11, and TP53).   Marland Kitchen Results should be available in approximately 2-4 weeks, at which point we will contact her and address implications for her as well as address genetic testing for at-risk family members, if needed.     Dr. Burr Medico was available for questions concerning this case. Total time spent by me in face-to-face counseling was approximately 35 minutes.   _____________________________________________________________________   History of Present Illness: [name], a 56 y.o. female, is being seen at the [facility] due to a personal and family history of cancer. She presents to clinic today to discuss the possibility of a hereditary predisposition to cancer and discuss whether genetic testing is warranted.  [name] was recently diagnosed with right breast cancer at the age of 47. She had a previous right breast cancer in a different area of the  right breast at age 42 for which she had lumpectomy, radiation and chemotherapy.   Oncology History   Cancer Staging Malignant neoplasm of upper-inner quadrant of right breast in female, estrogen receptor positive ([place]) Staging form: Breast, AJCC 8th Edition - Clinical stage from 10/09/2016: Stage IA (cT1c, cN0, cM0, G3, ER: Positive, PR: Positive, HER2: Negative) - Signed by Truitt Merle, MD on 10/14/2016       Malignant neoplasm of upper-inner quadrant of right breast in female, estrogen receptor positive ([place])   10/08/2016 Mammogram    Diagnostic mammogram and ultrasound showed an irregular mass in the right breast at 1 o'clock, 5 cm from the nipple measuring 1.6 x 1.4x 1.6 cm, axilla was negative for adenopathy.      10/09/2016 Receptors her2    Estrogen Receptor: 80%, POSITIVE, STRONG STAINING INTENSITY Progesterone Receptor: 30%, POSITIVE, STRONG STAINING INTENSITY Proliferation Marker Ki67: 35% HER2 NEGATIVE       10/09/2016 Initial Diagnosis    Malignant neoplasm of upper-inner quadrant of right breast in female, estrogen receptor positive ([place])      10/09/2016 Initial Biopsy    Diagnosis Breast, right, needle core biopsy, upper inner quadrant, 1:00 o'clock - INVASIVE DUCTAL CARCINOMA, G3       Past Medical History:  Diagnosis Date  . Breast cancer ([place])    lumpectomy; chemotherapy and radiation  . Family history of breast cancer   . Heart murmur    as a child - Echo in early 20s "ok"  . History of echocardiogram    Echo 4/17: EF 55-60%, GLS -19.6%, normal wall motion  .  History of exercise stress test    ETT 4/17 - No ST changes  . HLD (hyperlipidemia)   . Hypothyroidism   . Osteoporosis   . Personal history of chemotherapy   . Personal history of radiation therapy     Past Surgical History:  Procedure Laterality Date  . BREAST LUMPECTOMY Right 2003  . BREAST SURGERY  2003 /right   lumpectomy; chemotherapy and radiation  . TOTAL ABDOMINAL HYSTERECTOMY W/  BILATERAL SALPINGOOPHORECTOMY     for treatment of breast treatment    Social History   Social History  . Marital status: Married    Spouse name: N/A  . Number of children: N/A  . Years of education: N/A   Social History Main Topics  . Smoking status: Never Smoker  . Smokeless tobacco: Never Used  . Alcohol use 0.0 oz/week     Comment: Rare  . Drug use: No  . Sexual activity: Yes    Birth control/ protection: Surgical   Other Topics Concern  . Not on file   Social History Narrative   Married. Bobby.   Children: none. No pets.    Stay at home.    Wears her seatbelt. Smoke alarm in the home.    Feels safe in her relationships.    Exercise 5x week.      Family History:  During the visit, a 4-generation pedigree was obtained. Family tree will be scanned in the Media tab in Epic  Significant diagnoses include the following:  Family History  Problem Relation Age of Onset  . Diabetes Other        1ST DEGREE RELATIVE  . Coronary artery disease Mother        1ST DEGREE RELATIVE  . Heart attack Mother 11  . Skin cancer Mother   . Diabetes Father   . Kidney disease Father   . Prostate cancer Father 25       deceased 32  . Cancer Brother 82       multiple myeloma   . Lung cancer Maternal Uncle        in 2 mat uncles  . Breast cancer Paternal Aunt 56  . Breast cancer Cousin        dx 84s; daughter of unaffected paternal aunt  . Breast cancer Cousin        dx 68s; another daughter of unaffected paternal aunt  . Esophageal cancer Cousin        pat first cousin    Additionally, Olivia [name] has no children. She has 2 brothers and 2 sisters. Her mother (age 74) did have a TAH/BSO at age 20. Her mother had 3 brothers and 2 sisters. Her father had 3 brothers and 2 sisters.  Olivia [name] ancestry is Caucasian - NOS. There is no known Jewish ancestry and no consanguinity.  Discussion: We reviewed the characteristics, features and inheritance patterns of hereditary cancer  syndromes. We discussed her risk of harboring a mutation in the context of her personal and family history. We discussed the process of genetic testing, insurance coverage and implications of results: positive, negative and variant of unknown significance (VUS).    Olivia [name] questions were answered to her satisfaction today and she is welcome to call with any additional questions or concerns. Thank you for the referral and allowing [place] to share in the care of your patient.    Steele Berg, MS, [place] Certified Genetic Counselor phone: [phone] Adin [email or web address]   ______________________________________________________________________ For Office Staff:  Number of  people involved in session: 1 Was an Intern/ student involved with case: no

## 2016-11-06 ENCOUNTER — Telehealth: Payer: Self-pay | Admitting: Hematology

## 2016-11-06 NOTE — Telephone Encounter (Signed)
Spoke with patient about appts per 10/4 los.

## 2016-11-07 ENCOUNTER — Ambulatory Visit: Payer: Self-pay | Admitting: Plastic Surgery

## 2016-11-07 DIAGNOSIS — C50211 Malignant neoplasm of upper-inner quadrant of right female breast: Secondary | ICD-10-CM

## 2016-11-07 DIAGNOSIS — Z170 Estrogen receptor positive status [ER+]: Secondary | ICD-10-CM

## 2016-11-07 NOTE — H&P (Signed)
Olivia [name] is an 56 y.o. female.   Chief Complaint: breast cancer HPI: The patient is a 56 y.o. yrs old wf here for breast reconstruction.  She went for a routing mammogram and was found to invasive ductal carcinoma of the RIGHT breast in the upper inner quadrant 1.6 cm with a negative axilla.  It is ER / PR positive, HER2 negative and Ki67 of 35%.  She is 5 feet 4 inches tall, weight is 135 pounds and preop bra= 36 B.  She underwent a hysterectomy in 2005 and right lumpectomy in 2003 with chemo and radiation.  She does not have kids, she is married and her husband has mild dementia.  She would like to have bilateral mastectomies.   Past Medical History:  Diagnosis Date  . Breast cancer (HCC)    lumpectomy; chemotherapy and radiation  . Family history of breast cancer   . Heart murmur    as a child - Echo in early 20s "ok"  . History of echocardiogram    Echo 4/17: EF 55-60%, GLS -19.6%, normal wall motion  . History of exercise stress test    ETT 4/17 - No ST changes  . HLD (hyperlipidemia)   . Hypothyroidism   . Osteoporosis   . Personal history of chemotherapy   . Personal history of radiation therapy     Past Surgical History:  Procedure Laterality Date  . BREAST LUMPECTOMY Right 2003  . BREAST SURGERY  2003 /right   lumpectomy; chemotherapy and radiation  . TOTAL ABDOMINAL HYSTERECTOMY W/ BILATERAL SALPINGOOPHORECTOMY     for treatment of breast treatment    Family History  Problem Relation Age of Onset  . Diabetes Other        1ST DEGREE RELATIVE  . Coronary artery disease Mother        1ST DEGREE RELATIVE  . Heart attack Mother 27  . Skin cancer Mother   . Diabetes Father   . Kidney disease Father   . Prostate cancer Father 27       deceased 26  . Cancer Brother 7       multiple myeloma   . Lung cancer Maternal Uncle        in 2 mat uncles  . Breast cancer Paternal Aunt 16  . Breast cancer Cousin        dx 9s; daughter of unaffected paternal aunt  .  Breast cancer Cousin        dx 38s; another daughter of unaffected paternal aunt  . Esophageal cancer Cousin        pat first cousin   Social History:  reports that she has never smoked. She has never used smokeless tobacco. She reports that she drinks alcohol. She reports that she does not use drugs.  Allergies: No Known Allergies   (Not in a hospital admission)  No results found for this or any previous visit (from the past 48 hour(s)). No results found.  Review of Systems  Constitutional: Negative.   HENT: Negative.   Eyes: Negative.   Respiratory: Negative.   Cardiovascular: Negative.   Gastrointestinal: Negative.   Genitourinary: Negative.   Musculoskeletal: Negative.   Skin: Negative.   Neurological: Negative.   Psychiatric/Behavioral: Negative.     There were no vitals taken for this visit. Physical Exam  Constitutional: She is oriented to person, place, and time. She appears well-developed and well-nourished.  HENT:  Head: Normocephalic and atraumatic.  Eyes: Pupils are equal, round, and reactive  to light. EOM are normal.  Cardiovascular: Normal rate.   Respiratory: Effort normal.  GI: Soft.  Neurological: She is alert and oriented to person, place, and time.  Skin: Skin is warm. No erythema.  Psychiatric: She has a normal mood and affect. Her behavior is normal. Judgment and thought content normal.     Assessment/Plan Plan for immediate bilateral breast reconstruction with expander and FlexHD.  Due to the previous radiation on the right side she may have a challenge with expansion on the right side.  She can still go with immediate bilateral reconstruction with expander and Flex HD and plan for exchange.  Any difficulty she will need a right latissimus muscle flap.  She is aware and agrees with the plan.  Call placed to Dr. Ronnell Freshwater, DO 11/07/2016, 2:33 PM

## 2016-11-11 ENCOUNTER — Encounter (HOSPITAL_COMMUNITY): Payer: Self-pay

## 2016-11-11 ENCOUNTER — Encounter (HOSPITAL_COMMUNITY)
Admission: RE | Admit: 2016-11-11 | Discharge: 2016-11-11 | Disposition: A | Discharge status: Home / Self Care | Payer: BLUE CROSS/BLUE SHIELD | Source: Ambulatory Visit | Attending: General Surgery | Admitting: General Surgery

## 2016-11-11 ENCOUNTER — Other Ambulatory Visit (HOSPITAL_COMMUNITY): Payer: Self-pay | Admitting: *Deleted

## 2016-11-11 DIAGNOSIS — Z803 Family history of malignant neoplasm of breast: Secondary | ICD-10-CM | POA: Insufficient documentation

## 2016-11-11 DIAGNOSIS — Z01812 Encounter for preprocedural laboratory examination: Secondary | ICD-10-CM | POA: Insufficient documentation

## 2016-11-11 DIAGNOSIS — E785 Hyperlipidemia, unspecified: Secondary | ICD-10-CM | POA: Diagnosis not present

## 2016-11-11 DIAGNOSIS — K219 Gastro-esophageal reflux disease without esophagitis: Secondary | ICD-10-CM | POA: Insufficient documentation

## 2016-11-11 DIAGNOSIS — C50911 Malignant neoplasm of unspecified site of right female breast: Secondary | ICD-10-CM | POA: Diagnosis not present

## 2016-11-11 DIAGNOSIS — Z853 Personal history of malignant neoplasm of breast: Secondary | ICD-10-CM | POA: Insufficient documentation

## 2016-11-11 DIAGNOSIS — Z9221 Personal history of antineoplastic chemotherapy: Secondary | ICD-10-CM | POA: Insufficient documentation

## 2016-11-11 DIAGNOSIS — R011 Cardiac murmur, unspecified: Secondary | ICD-10-CM | POA: Diagnosis not present

## 2016-11-11 DIAGNOSIS — M810 Age-related osteoporosis without current pathological fracture: Secondary | ICD-10-CM | POA: Diagnosis not present

## 2016-11-11 HISTORY — DX: Gastro-esophageal reflux disease without esophagitis: K21.9

## 2016-11-11 LAB — CBC
HEMATOCRIT: 41.7 % (ref 36.0–46.0)
HEMOGLOBIN: 13.9 g/dL (ref 12.0–15.0)
MCH: 29.1 pg (ref 26.0–34.0)
MCHC: 33.3 g/dL (ref 30.0–36.0)
MCV: 87.4 fL (ref 78.0–100.0)
PLATELETS: 237 10*3/uL (ref 150–400)
RBC: 4.77 MIL/uL (ref 3.87–5.11)
RDW: 13 % (ref 11.5–15.5)
WBC: 7.2 10*3/uL (ref 4.0–10.5)

## 2016-11-11 LAB — BASIC METABOLIC PANEL
Anion gap: 10 (ref 5–15)
BUN: 17 mg/dL (ref 6–20)
CHLORIDE: 101 mmol/L (ref 101–111)
CO2: 25 mmol/L (ref 22–32)
CREATININE: 0.9 mg/dL (ref 0.44–1.00)
Calcium: 8.8 mg/dL — ABNORMAL LOW (ref 8.9–10.3)
GFR calc non Af Amer: >60 mL/min (ref >60)
Glucose, Bld: 123 mg/dL — ABNORMAL HIGH (ref 65–99)
POTASSIUM: 3.8 mmol/L (ref 3.5–5.1)
Sodium: 136 mmol/L (ref 135–145)

## 2016-11-11 NOTE — Progress Notes (Signed)
PCP  Howard Pouch     Had episode of Chest pain over a year ago was seen by cardiology,had normal ECHO and Stress  Done at that time.

## 2016-11-11 NOTE — Pre-Procedure Instructions (Signed)
Olivia [name]  11/11/2016      [id number] - [place], [place] - [address] [place] [address] Phone: [phone] Fax: [phone]    Your procedure is scheduled on 11-17-2016 Monday   Report to [facility] Admitting at 9:00 A.M.   Call this number if you have problems the morning of surgery:  [phone]   Remember:  Do not eat food or drink liquids after midnight.   Take these medicines the morning of surgery with A SIP OF WATER meclizine(Antivert) if needed  STOP ASPIRIN,ANTIINFLAMATORIES (IBUPROFEN,ALEVE,MOTRIN,ADVIL,GOODY'S POWDERS),HERBAL SUPPLEMENTS,FISH OIL,AND VITAMINS 5-7 DAYS PRIOR TO SURGERY     Do not wear jewelry, make-up or nail polish.  Do not wear lotions, powders, or perfumes, or deoderant.  Do not shave 48 hours prior to surgery.  Men may shave face and neck.   Do not bring valuables to the hospital.  [facility] is not responsible for any belongings or valuables.  Contacts, dentures or bridgework may not be worn into surgery.  Leave your suitcase in the car.  After surgery it may be brought to your room.  For patients admitted to the hospital, discharge time will be determined by your treatment team.  Patients discharged the day of surgery will not be allowed to drive home.    Special Instructions: [facility] - Preparing for Surgery  Before surgery, you can play an important role.  Because skin is not sterile, your skin needs to be as free of germs as possible.  You can reduce the number of germs on you skin by washing with CHG (chlorahexidine gluconate) soap before surgery.  CHG is an antiseptic cleaner which kills germs and bonds with the skin to continue killing germs even after washing.  Please DO NOT use if you have an allergy to CHG or antibacterial soaps.  If your skin becomes reddened/irritated stop using the CHG and inform your nurse when you arrive at Short Stay.  Do not  shave (including legs and underarms) for at least 48 hours prior to the first CHG shower.  You may shave your face.  Please follow these instructions carefully:   1.  Shower with CHG Soap the night before surgery and the   morning of Surgery.  2.  If you choose to wash your hair, wash your hair first as usual with your normal shampoo.  3.  After you shampoo, rinse your hair and body thoroughly to remove the  Shampoo.  4.  Use CHG as you would any other liquid soap.  You can apply chg directly  to the skin and wash gently with scrungie or a clean washcloth.  5.  Apply the CHG Soap to your body ONLY FROM THE NECK DOWN.   Do not use on open wounds or open sores.  Avoid contact with your eyes,  ears, mouth and genitals (private parts).  Wash genitals (private parts) with your normal soap.  6.  Wash thoroughly, paying special attention to the area where your surgery will be performed.  7.  Thoroughly rinse your body with warm water from the neck down.  8.  DO NOT shower/wash with your normal soap after using and rinsing o  the CHG Soap.  9.  Pat yourself dry with a clean towel.            10.  Wear clean pajamas.  11.  Place clean sheets on your bed the night of your first shower and do not sleep with pets.  Day of Surgery  Do not apply any lotions/deodorants the morning of surgery.  Please wear clean clothes to the hospital/surgery center.   Please read over the following fact sheets that you were given. Coughing and Deep Breathing and Surgical Site Infection Prevention

## 2016-11-14 ENCOUNTER — Ambulatory Visit: Payer: Self-pay | Admitting: Plastic Surgery

## 2016-11-17 ENCOUNTER — Observation Stay (HOSPITAL_COMMUNITY)
Admission: RE | Admit: 2016-11-17 | Discharge: 2016-11-18 | Disposition: A | Discharge status: Home / Self Care | Payer: BLUE CROSS/BLUE SHIELD | Source: Ambulatory Visit | Attending: Plastic Surgery | Admitting: Plastic Surgery

## 2016-11-17 ENCOUNTER — Ambulatory Visit (HOSPITAL_COMMUNITY): Payer: BLUE CROSS/BLUE SHIELD | Admitting: Anesthesiology

## 2016-11-17 ENCOUNTER — Encounter: Payer: Self-pay | Admitting: Genetic Counselor

## 2016-11-17 ENCOUNTER — Ambulatory Visit: Payer: Self-pay | Admitting: Genetic Counselor

## 2016-11-17 ENCOUNTER — Encounter (HOSPITAL_COMMUNITY): Payer: Self-pay

## 2016-11-17 ENCOUNTER — Encounter (HOSPITAL_COMMUNITY)
Admission: RE | Disposition: A | Discharge status: Home / Self Care | Payer: Self-pay | Source: Ambulatory Visit | Attending: Plastic Surgery

## 2016-11-17 DIAGNOSIS — Z923 Personal history of irradiation: Secondary | ICD-10-CM | POA: Diagnosis not present

## 2016-11-17 DIAGNOSIS — K219 Gastro-esophageal reflux disease without esophagitis: Secondary | ICD-10-CM | POA: Diagnosis not present

## 2016-11-17 DIAGNOSIS — N6042 Mammary duct ectasia of left breast: Secondary | ICD-10-CM | POA: Diagnosis not present

## 2016-11-17 DIAGNOSIS — Z9221 Personal history of antineoplastic chemotherapy: Secondary | ICD-10-CM | POA: Diagnosis not present

## 2016-11-17 DIAGNOSIS — E039 Hypothyroidism, unspecified: Secondary | ICD-10-CM | POA: Insufficient documentation

## 2016-11-17 DIAGNOSIS — Z85820 Personal history of malignant melanoma of skin: Secondary | ICD-10-CM | POA: Diagnosis not present

## 2016-11-17 DIAGNOSIS — L989 Disorder of the skin and subcutaneous tissue, unspecified: Secondary | ICD-10-CM | POA: Diagnosis not present

## 2016-11-17 DIAGNOSIS — G8918 Other acute postprocedural pain: Secondary | ICD-10-CM | POA: Diagnosis not present

## 2016-11-17 DIAGNOSIS — Z79899 Other long term (current) drug therapy: Secondary | ICD-10-CM | POA: Insufficient documentation

## 2016-11-17 DIAGNOSIS — Z1379 Encounter for other screening for genetic and chromosomal anomalies: Secondary | ICD-10-CM

## 2016-11-17 DIAGNOSIS — Z4001 Encounter for prophylactic removal of breast: Secondary | ICD-10-CM | POA: Diagnosis not present

## 2016-11-17 DIAGNOSIS — C50911 Malignant neoplasm of unspecified site of right female breast: Secondary | ICD-10-CM | POA: Diagnosis not present

## 2016-11-17 DIAGNOSIS — C50919 Malignant neoplasm of unspecified site of unspecified female breast: Secondary | ICD-10-CM | POA: Diagnosis present

## 2016-11-17 DIAGNOSIS — Z853 Personal history of malignant neoplasm of breast: Secondary | ICD-10-CM | POA: Diagnosis not present

## 2016-11-17 DIAGNOSIS — Z90710 Acquired absence of both cervix and uterus: Secondary | ICD-10-CM | POA: Insufficient documentation

## 2016-11-17 DIAGNOSIS — C50211 Malignant neoplasm of upper-inner quadrant of right female breast: Secondary | ICD-10-CM | POA: Diagnosis not present

## 2016-11-17 DIAGNOSIS — Z170 Estrogen receptor positive status [ER+]: Secondary | ICD-10-CM | POA: Insufficient documentation

## 2016-11-17 HISTORY — PX: BREAST RECONSTRUCTION WITH PLACEMENT OF TISSUE EXPANDER AND FLEX HD (ACELLULAR HYDRATED DERMIS): SHX6295

## 2016-11-17 HISTORY — PX: TOTAL MASTECTOMY: SHX6129

## 2016-11-17 SURGERY — MASTECTOMY, SIMPLE
Anesthesia: General | Site: Breast | Laterality: Bilateral

## 2016-11-17 MED ORDER — ONDANSETRON HCL 4 MG/2ML IJ SOLN
INTRAMUSCULAR | Status: AC
  Filled 2016-11-17: qty 8

## 2016-11-17 MED ORDER — DEXAMETHASONE SODIUM PHOSPHATE 10 MG/ML IJ SOLN
INTRAMUSCULAR | Status: DC | PRN
  Administered 2016-11-17: 10 mg via INTRAVENOUS

## 2016-11-17 MED ORDER — LACTATED RINGERS IV SOLN
INTRAVENOUS | Status: DC
  Administered 2016-11-17 (×2): via INTRAVENOUS

## 2016-11-17 MED ORDER — DIPHENHYDRAMINE HCL 50 MG/ML IJ SOLN: 12.5000 mg | Freq: Four times a day (QID) | INTRAMUSCULAR | Status: DC | PRN

## 2016-11-17 MED ORDER — CELECOXIB 200 MG PO CAPS
ORAL_CAPSULE | ORAL | Status: AC
  Administered 2016-11-17: 200 mg via ORAL
  Filled 2016-11-17: qty 1

## 2016-11-17 MED ORDER — PROPOFOL 10 MG/ML IV BOLUS
INTRAVENOUS | Status: AC
  Filled 2016-11-17: qty 20

## 2016-11-17 MED ORDER — PHENYLEPHRINE 40 MCG/ML (10ML) SYRINGE FOR IV PUSH (FOR BLOOD PRESSURE SUPPORT)
PREFILLED_SYRINGE | INTRAVENOUS | Status: AC
  Filled 2016-11-17: qty 20

## 2016-11-17 MED ORDER — CELECOXIB 200 MG PO CAPS
200.0000 mg | ORAL_CAPSULE | ORAL | Status: AC
Start: 2016-11-17 — End: 2016-11-17
  Administered 2016-11-17: 200 mg via ORAL

## 2016-11-17 MED ORDER — CEFAZOLIN SODIUM-DEXTROSE 2-4 GM/100ML-% IV SOLN
INTRAVENOUS | Status: AC
  Filled 2016-11-17: qty 100

## 2016-11-17 MED ORDER — CEFAZOLIN SODIUM-DEXTROSE 2-4 GM/100ML-% IV SOLN: 2.0000 g | INTRAVENOUS | Status: DC

## 2016-11-17 MED ORDER — MIDAZOLAM HCL 2 MG/2ML IJ SOLN
2.0000 mg | Freq: Once | INTRAMUSCULAR | Status: AC
  Administered 2016-11-17: 2 mg via INTRAVENOUS

## 2016-11-17 MED ORDER — ROCURONIUM BROMIDE 100 MG/10ML IV SOLN
INTRAVENOUS | Status: DC | PRN
  Administered 2016-11-17 (×2): 50 mg via INTRAVENOUS

## 2016-11-17 MED ORDER — FENTANYL CITRATE (PF) 100 MCG/2ML IJ SOLN
INTRAMUSCULAR | Status: AC
  Administered 2016-11-17: 100 ug via INTRAVENOUS
  Filled 2016-11-17: qty 2

## 2016-11-17 MED ORDER — DIPHENHYDRAMINE HCL 12.5 MG/5ML PO ELIX: 12.5000 mg | ORAL_SOLUTION | Freq: Four times a day (QID) | ORAL | Status: DC | PRN

## 2016-11-17 MED ORDER — HYDROCORTISONE 2.5 % RE CREA: 1.0000 "application " | TOPICAL_CREAM | Freq: Two times a day (BID) | RECTAL | Status: DC

## 2016-11-17 MED ORDER — CEFAZOLIN SODIUM-DEXTROSE 2-4 GM/100ML-% IV SOLN
2.0000 g | Freq: Three times a day (TID) | INTRAVENOUS | Status: DC
  Administered 2016-11-17 – 2016-11-18 (×3): 2 g via INTRAVENOUS
  Filled 2016-11-17 (×6): qty 100

## 2016-11-17 MED ORDER — FENTANYL CITRATE (PF) 250 MCG/5ML IJ SOLN
INTRAMUSCULAR | Status: AC
  Filled 2016-11-17: qty 5

## 2016-11-17 MED ORDER — ONDANSETRON HCL 4 MG/2ML IJ SOLN
INTRAMUSCULAR | Status: DC | PRN
  Administered 2016-11-17 (×2): 4 mg via INTRAVENOUS

## 2016-11-17 MED ORDER — DIAZEPAM 2 MG PO TABS
2.0000 mg | ORAL_TABLET | Freq: Two times a day (BID) | ORAL | Status: DC | PRN
  Administered 2016-11-17: 2 mg via ORAL
  Filled 2016-11-17: qty 1

## 2016-11-17 MED ORDER — EPHEDRINE 5 MG/ML INJ
INTRAVENOUS | Status: AC
  Filled 2016-11-17: qty 10

## 2016-11-17 MED ORDER — PROPOFOL 10 MG/ML IV BOLUS
INTRAVENOUS | Status: DC | PRN
Start: 2016-11-17 — End: 2016-11-17
  Administered 2016-11-17: 200 mg via INTRAVENOUS

## 2016-11-17 MED ORDER — ONDANSETRON HCL 4 MG/2ML IJ SOLN
4.0000 mg | Freq: Four times a day (QID) | INTRAMUSCULAR | Status: DC | PRN
Start: 2016-11-17 — End: 2016-11-18

## 2016-11-17 MED ORDER — POLYETHYLENE GLYCOL 3350 17 G PO PACK: 17.0000 g | PACK | Freq: Every day | ORAL | Status: DC | PRN

## 2016-11-17 MED ORDER — HYDROMORPHONE HCL 1 MG/ML IJ SOLN: 0.5000 mg | INTRAMUSCULAR | Status: DC | PRN

## 2016-11-17 MED ORDER — BUPIVACAINE-EPINEPHRINE (PF) 0.25% -1:200000 IJ SOLN
INTRAMUSCULAR | Status: DC | PRN
  Administered 2016-11-17 (×2): 30 mg

## 2016-11-17 MED ORDER — NAPROXEN 250 MG PO TABS: 500.0000 mg | ORAL_TABLET | Freq: Two times a day (BID) | ORAL | Status: DC | PRN

## 2016-11-17 MED ORDER — OXYCODONE HCL 5 MG PO TABS
5.0000 mg | ORAL_TABLET | ORAL | Status: DC | PRN
  Administered 2016-11-17 – 2016-11-18 (×3): 10 mg via ORAL
  Filled 2016-11-17 (×3): qty 2

## 2016-11-17 MED ORDER — ACETAMINOPHEN 500 MG PO TABS
ORAL_TABLET | ORAL | Status: AC
  Administered 2016-11-17: 1000 mg via ORAL
  Filled 2016-11-17: qty 2

## 2016-11-17 MED ORDER — LIDOCAINE HCL (CARDIAC) 20 MG/ML IV SOLN
INTRAVENOUS | Status: DC | PRN
  Administered 2016-11-17: 40 mg via INTRAVENOUS

## 2016-11-17 MED ORDER — SORBITOL 70 % SOLN
30.0000 mL | Freq: Every day | Status: DC | PRN
  Filled 2016-11-17: qty 30

## 2016-11-17 MED ORDER — ROCURONIUM BROMIDE 50 MG/5ML IV SOLN
INTRAVENOUS | Status: AC
  Filled 2016-11-17: qty 3

## 2016-11-17 MED ORDER — GABAPENTIN 300 MG PO CAPS
ORAL_CAPSULE | ORAL | Status: AC
  Administered 2016-11-17: 300 mg via ORAL
  Filled 2016-11-17: qty 1

## 2016-11-17 MED ORDER — SUGAMMADEX SODIUM 200 MG/2ML IV SOLN
INTRAVENOUS | Status: DC | PRN
  Administered 2016-11-17: 120 mg via INTRAVENOUS

## 2016-11-17 MED ORDER — 0.9 % SODIUM CHLORIDE (POUR BTL) OPTIME
TOPICAL | Status: DC | PRN
  Administered 2016-11-17 (×3): 1000 mL

## 2016-11-17 MED ORDER — MIDAZOLAM HCL 2 MG/2ML IJ SOLN
INTRAMUSCULAR | Status: AC
  Administered 2016-11-17: 2 mg via INTRAVENOUS
  Filled 2016-11-17: qty 2

## 2016-11-17 MED ORDER — PHENYLEPHRINE HCL 10 MG/ML IJ SOLN
INTRAMUSCULAR | Status: DC | PRN
  Administered 2016-11-17 (×2): 120 ug via INTRAVENOUS

## 2016-11-17 MED ORDER — PHENYLEPHRINE HCL 10 MG/ML IJ SOLN
INTRAMUSCULAR | Status: DC | PRN
  Administered 2016-11-17: 30 ug/min via INTRAVENOUS

## 2016-11-17 MED ORDER — SUGAMMADEX SODIUM 200 MG/2ML IV SOLN
INTRAVENOUS | Status: AC
  Filled 2016-11-17: qty 2

## 2016-11-17 MED ORDER — WHITE PETROLATUM EX OINT
TOPICAL_OINTMENT | CUTANEOUS | Status: AC
  Filled 2016-11-17: qty 28.35

## 2016-11-17 MED ORDER — ONDANSETRON 4 MG PO TBDP: 4.0000 mg | ORAL_TABLET | Freq: Four times a day (QID) | ORAL | Status: DC | PRN

## 2016-11-17 MED ORDER — CEFAZOLIN SODIUM-DEXTROSE 2-4 GM/100ML-% IV SOLN
2.0000 g | INTRAVENOUS | Status: AC
  Administered 2016-11-17: 2 g via INTRAVENOUS

## 2016-11-17 MED ORDER — MECLIZINE HCL 25 MG PO TABS
25.0000 mg | ORAL_TABLET | Freq: Three times a day (TID) | ORAL | Status: DC | PRN
Start: 2016-11-17 — End: 2016-11-18
  Filled 2016-11-17: qty 1

## 2016-11-17 MED ORDER — KCL IN DEXTROSE-NACL 20-5-0.45 MEQ/L-%-% IV SOLN
INTRAVENOUS | Status: DC
  Administered 2016-11-18: 05:00:00 via INTRAVENOUS
  Filled 2016-11-17: qty 1000

## 2016-11-17 MED ORDER — DEXAMETHASONE SODIUM PHOSPHATE 10 MG/ML IJ SOLN
INTRAMUSCULAR | Status: AC
  Filled 2016-11-17: qty 2

## 2016-11-17 MED ORDER — PANTOPRAZOLE SODIUM 40 MG PO TBEC: 40.0000 mg | DELAYED_RELEASE_TABLET | Freq: Every day | ORAL | Status: DC

## 2016-11-17 MED ORDER — CHLORHEXIDINE GLUCONATE CLOTH 2 % EX PADS: 6.0000 | MEDICATED_PAD | Freq: Once | CUTANEOUS | Status: DC

## 2016-11-17 MED ORDER — LIDOCAINE 2% (20 MG/ML) 5 ML SYRINGE
INTRAMUSCULAR | Status: AC
  Filled 2016-11-17: qty 15

## 2016-11-17 MED ORDER — EPHEDRINE SULFATE 50 MG/ML IJ SOLN
INTRAMUSCULAR | Status: DC | PRN
  Administered 2016-11-17: 10 mg via INTRAVENOUS

## 2016-11-17 MED ORDER — SODIUM CHLORIDE 0.9 % IV SOLN
INTRAVENOUS | Status: DC | PRN
  Administered 2016-11-17: 500 mL

## 2016-11-17 MED ORDER — GABAPENTIN 300 MG PO CAPS
300.0000 mg | ORAL_CAPSULE | ORAL | Status: AC
  Administered 2016-11-17: 300 mg via ORAL

## 2016-11-17 MED ORDER — ZOLPIDEM TARTRATE 5 MG PO TABS: 5.0000 mg | ORAL_TABLET | Freq: Every evening | ORAL | Status: DC | PRN

## 2016-11-17 MED ORDER — ACETAMINOPHEN 500 MG PO TABS
1000.0000 mg | ORAL_TABLET | ORAL | Status: AC
  Administered 2016-11-17: 1000 mg via ORAL

## 2016-11-17 MED ORDER — SCOPOLAMINE 1 MG/3DAYS TD PT72
MEDICATED_PATCH | TRANSDERMAL | Status: DC | PRN
  Administered 2016-11-17: 1 via TRANSDERMAL

## 2016-11-17 MED ORDER — SODIUM CHLORIDE 0.9 % IJ SOLN: INTRAMUSCULAR | Status: DC | PRN

## 2016-11-17 MED ORDER — FENTANYL CITRATE (PF) 100 MCG/2ML IJ SOLN
100.0000 ug | Freq: Once | INTRAMUSCULAR | Status: AC
  Administered 2016-11-17: 100 ug via INTRAVENOUS

## 2016-11-17 MED ORDER — FENTANYL CITRATE (PF) 100 MCG/2ML IJ SOLN
INTRAMUSCULAR | Status: DC | PRN
  Administered 2016-11-17 (×2): 50 ug via INTRAVENOUS

## 2016-11-17 SURGICAL SUPPLY — 69 items
ADH SKN CLS APL DERMABOND .7 (GAUZE/BANDAGES/DRESSINGS) ×4
APPLIER CLIP 9.375 MED OPEN (MISCELLANEOUS) ×3
APR CLP MED 9.3 20 MLT OPN (MISCELLANEOUS) ×2
BAG DECANTER FOR FLEXI CONT (MISCELLANEOUS) ×3 IMPLANT
BINDER ABDOMINAL 12 ML 46-62 (SOFTGOODS) ×1 IMPLANT
BINDER BREAST LRG (GAUZE/BANDAGES/DRESSINGS) IMPLANT
BINDER BREAST XLRG (GAUZE/BANDAGES/DRESSINGS) IMPLANT
BIOPATCH RED 1 DISK 7.0 (GAUZE/BANDAGES/DRESSINGS) ×12 IMPLANT
BLADE SURG 10 STRL SS (BLADE) ×4 IMPLANT
CANISTER SUCT 3000ML PPV (MISCELLANEOUS) ×6 IMPLANT
CHLORAPREP W/TINT 26ML (MISCELLANEOUS) ×6 IMPLANT
CLIP APPLIE 9.375 MED OPEN (MISCELLANEOUS) ×2 IMPLANT
COVER SURGICAL LIGHT HANDLE (MISCELLANEOUS) ×6 IMPLANT
DERMABOND ADVANCED (GAUZE/BANDAGES/DRESSINGS) ×2
DERMABOND ADVANCED .7 DNX12 (GAUZE/BANDAGES/DRESSINGS) ×4 IMPLANT
DEVICE DISSECT PLASMABLAD 3.0S (MISCELLANEOUS) IMPLANT
DRAIN CHANNEL 19F RND (DRAIN) ×8 IMPLANT
DRAPE HALF SHEET 40X57 (DRAPES) ×3 IMPLANT
DRAPE LAPAROSCOPIC ABDOMINAL (DRAPES) ×3 IMPLANT
DRAPE ORTHO SPLIT 77X108 STRL (DRAPES) ×6
DRAPE SURG 17X23 STRL (DRAPES) ×12 IMPLANT
DRAPE SURG ORHT 6 SPLT 77X108 (DRAPES) ×4 IMPLANT
DRAPE UTILITY XL STRL (DRAPES) ×6 IMPLANT
DRAPE WARM FLUID 44X44 (DRAPE) ×3 IMPLANT
DRSG PAD ABDOMINAL 8X10 ST (GAUZE/BANDAGES/DRESSINGS) ×4 IMPLANT
ELECT BLADE 4.0 EZ CLEAN MEGAD (MISCELLANEOUS) ×3
ELECT COATED BLADE 2.86 ST (ELECTRODE) ×3 IMPLANT
ELECT REM PT RETURN 9FT ADLT (ELECTROSURGICAL) ×6
ELECTRODE BLDE 4.0 EZ CLN MEGD (MISCELLANEOUS) IMPLANT
ELECTRODE REM PT RTRN 9FT ADLT (ELECTROSURGICAL) ×4 IMPLANT
EVACUATOR SILICONE 100CC (DRAIN) ×8 IMPLANT
GAUZE SPONGE 4X4 12PLY STRL (GAUZE/BANDAGES/DRESSINGS) ×6 IMPLANT
GAUZE SPONGE 4X4 12PLY STRL LF (GAUZE/BANDAGES/DRESSINGS) ×2 IMPLANT
GLOVE BIO SURGEON STRL SZ 6.5 (GLOVE) ×6 IMPLANT
GLOVE BIO SURGEON STRL SZ7.5 (GLOVE) ×4 IMPLANT
GLOVE BIOGEL PI IND STRL 7.5 (GLOVE) IMPLANT
GLOVE BIOGEL PI INDICATOR 7.5 (GLOVE) ×1
GOWN STRL REUS W/ TWL LRG LVL3 (GOWN DISPOSABLE) ×8 IMPLANT
GOWN STRL REUS W/TWL LRG LVL3 (GOWN DISPOSABLE) ×12
GRAFT FLEX HD 4X16 THICK (Tissue Mesh) ×2 IMPLANT
IMPL BREAST 300CC (Breast) IMPLANT
IMPLANT BREAST 300CC (Breast) ×6 IMPLANT
KIT BASIN OR (CUSTOM PROCEDURE TRAY) ×6 IMPLANT
KIT ROOM TURNOVER OR (KITS) ×6 IMPLANT
LIGHT WAVEGUIDE WIDE FLAT (MISCELLANEOUS) IMPLANT
NS IRRIG 1000ML POUR BTL (IV SOLUTION) ×9 IMPLANT
PACK GENERAL/GYN (CUSTOM PROCEDURE TRAY) ×6 IMPLANT
PAD ABD 8X10 STRL (GAUZE/BANDAGES/DRESSINGS) ×12 IMPLANT
PAD ARMBOARD 7.5X6 YLW CONV (MISCELLANEOUS) ×6 IMPLANT
PIN SAFETY STERILE (MISCELLANEOUS) ×3 IMPLANT
PLASMABLADE 3.0S (MISCELLANEOUS) ×3
SET ASEPTIC TRANSFER (MISCELLANEOUS) ×2 IMPLANT
SPECIMEN JAR X LARGE (MISCELLANEOUS) ×3 IMPLANT
SPONGE LAP 18X18 X RAY DECT (DISPOSABLE) ×2 IMPLANT
SUT ETHILON 3 0 FSL (SUTURE) ×3 IMPLANT
SUT MNCRL AB 4-0 PS2 18 (SUTURE) ×8 IMPLANT
SUT MON AB 3-0 SH 27 (SUTURE) ×12
SUT MON AB 3-0 SH27 (SUTURE) ×4 IMPLANT
SUT MON AB 4-0 PC3 18 (SUTURE) ×3 IMPLANT
SUT MON AB 5-0 PS2 18 (SUTURE) ×8 IMPLANT
SUT PDS AB 2-0 CT1 27 (SUTURE) ×18 IMPLANT
SUT SILK 4 0 PS 2 (SUTURE) ×5 IMPLANT
SUT VIC AB 3-0 54X BRD REEL (SUTURE) IMPLANT
SUT VIC AB 3-0 BRD 54 (SUTURE)
SUT VIC AB 3-0 SH 18 (SUTURE) ×3 IMPLANT
TOWEL OR 17X24 6PK STRL BLUE (TOWEL DISPOSABLE) ×6 IMPLANT
TOWEL OR 17X26 10 PK STRL BLUE (TOWEL DISPOSABLE) ×6 IMPLANT
TRAY FOLEY W/METER SILVER 14FR (SET/KITS/TRAYS/PACK) IMPLANT
TUBE CONNECTING 12X1/4 (SUCTIONS) ×1 IMPLANT

## 2016-11-17 NOTE — Progress Notes (Signed)
[facility]       Genetic Test Results    Patient Name: ROYAL BEIRNE Patient DOB: 1960-09-24 Patient Age: 56 y.o. Encounter Date: 11/17/2016  Referring Provider: Truitt Merle, MD  Primary Care Provider: Ma Hillock, DO   Ms. Rote was called today to discuss genetic test results. Please see the Genetics note from her visit on 10/30/16 for a detailed discussion of her personal and family history.  Genetic Testing: At the time of Ms. Modisette's visit, she decided to pursue genetic testing of multiple genes associated with hereditary susceptibility to breast and gynecologic cancers. Testing included sequencing and deletion/duplication analysis. Testing did not reveal any pathogenic mutation in any of these genes.  A copy of the genetic test report will be scanned into Epic under the media tab.  The genes analyzed were the 23 genes on [facility]'s Breast/GYN panel (ATM, BARD1, BRCA1, BRCA2, BRIP1, CDH1, CHEK2, DICER1, EPCAM, MLH1,  MSH2, MSH6, NBN, NF1, PALB2, PMS2, PTEN, RAD50, RAD51C, RAD51D,SMARCA4, STK11, and TP53).  Since the current test is not perfect, it is possible that there may be a gene mutation that current testing cannot detect, but that chance is small. It is possible that a different genetic factor, which has not yet been discovered or is not on this panel, is responsible for the cancer diagnoses in the family. Again, the likelihood of this is low. No additional testing is recommended at this time for Ms. Sherryll Burger.  Cancer Screening: These results suggest that Ms. Solt's cancer was most likely not due to an inherited predisposition. Most cancers happen by chance and this test, along with details of her family history, suggests that her cancer falls into this category. We discussed continuing to follow the cancer screening guidelines provided by her physician.   Family Members: Given the young age of initial breast cancer in Ms. Fontenot (age 73),  women are recommended to speak with their own providers about when to initiate breast screenings.  Any relative who had cancer at a young age or had a particularly rare cancer may also wish to pursue genetic testing. Genetic counselors can be located in other cities, by visiting the website of the [employer] ([employer]) and Field seismologist for a Dietitian by zip code. management.    Lastly, cancer genetics is a rapidly advancing field and it is possible that new genetic tests will be appropriate for Ms. Buesing in the future. We encourage her to remain in contact with [place] on an annual basis so we can update her personal and family histories, and let her know of advances in cancer genetics that may benefit the family. Our contact number was provided. Ms. Roehm is welcome to call anytime with additional questions.     Steele Berg, MS, [employer] Certified Genetic Counselor phone: [phone]

## 2016-11-17 NOTE — Transfer of Care (Signed)
Immediate Anesthesia Transfer of Care Note  Patient: Olivia [name]  Procedure(s) Performed: RIGHT MASTECTOMY AND LEFT PROPHYLATIC MASTECTOMY (Bilateral Breast) IMEDITATE BREAST RECONSTRUCTION WITH PLACEMENT OF TISSUE EXPANDER AND FLEX HD (ACELLULAR HYDRATED DERMIS) (Bilateral )  Patient Location: PACU  Anesthesia Type:GA combined with regional for post-op pain  Level of Consciousness: awake, alert , oriented and patient cooperative  Airway & Oxygen Therapy: Patient Spontanous Breathing  Post-op Assessment: Report given to RN and Post -op Vital signs reviewed and stable  Post vital signs: Reviewed and stable  Last Vitals:  Vitals:   11/17/16 1055 11/17/16 1100  BP: (!) 99/37 (!) 80/43  Pulse: 76 77  Resp: 16 14  Temp:    SpO2: 100% 100%    Last Pain:  Vitals:   11/17/16 0917  TempSrc: Oral      Patients Stated Pain Goal: 3 (74/16/38 4536)  Complications: No apparent anesthesia complications

## 2016-11-17 NOTE — Anesthesia Procedure Notes (Signed)
Anesthesia Regional Block: Pectoralis block   Pre-Anesthetic Checklist: ,, timeout performed, Correct Patient, Correct Site, Correct Laterality, Correct Procedure, Correct Position, site marked, Risks and benefits discussed,  Surgical consent,  Pre-op evaluation,  At surgeon's request and post-op pain management  Laterality: Right  Prep: chloraprep       Needles:  Injection technique: Single-shot  Needle Type: Echogenic Stimulator Needle     Needle Length: 10cm  Needle Gauge: 21     Additional Needles:   Narrative:  Start time: 11/17/2016 10:57 AM End time: 11/17/2016 11:07 AM Injection made incrementally with aspirations every 5 mL.  Performed by: Personally

## 2016-11-17 NOTE — Anesthesia Preprocedure Evaluation (Addendum)
Anesthesia Evaluation  Patient identified by MRN, date of birth, ID band Patient awake    Reviewed: Allergy & Precautions, NPO status , Patient's Chart, lab work & pertinent test results  History of Anesthesia Complications Negative for: history of anesthetic complications  Airway Mallampati: II  TM Distance: >3 FB Neck ROM: Full    Dental no notable dental hx. (+) Dental Advisory Given   Pulmonary neg pulmonary ROS,    Pulmonary exam normal        Cardiovascular negative cardio ROS Normal cardiovascular exam     Neuro/Psych negative neurological ROS  negative psych ROS   GI/Hepatic Neg liver ROS, GERD  Medicated,  Endo/Other  Hypothyroidism   Renal/GU negative Renal ROS  negative genitourinary   Musculoskeletal negative musculoskeletal ROS (+)   Abdominal   Peds negative pediatric ROS (+)  Hematology negative hematology ROS (+)   Anesthesia Other Findings   Reproductive/Obstetrics negative OB ROS                            Anesthesia Physical Anesthesia Plan  ASA: II  Anesthesia Plan: General   Post-op Pain Management: GA combined w/ Regional for post-op pain   Induction: Intravenous  PONV Risk Score and Plan: 4 or greater and Ondansetron, Dexamethasone, Scopolamine patch - Pre-op and Diphenhydramine  Airway Management Planned: Oral ETT  Additional Equipment:   Intra-op Plan:   Post-operative Plan: Extubation in OR  Informed Consent: I have reviewed the patients History and Physical, chart, labs and discussed the procedure including the risks, benefits and alternatives for the proposed anesthesia with the patient or authorized representative who has indicated his/her understanding and acceptance.   Dental advisory given  Plan Discussed with: CRNA, Anesthesiologist and Surgeon  Anesthesia Plan Comments:         Anesthesia Quick Evaluation

## 2016-11-17 NOTE — Op Note (Signed)
11/17/2016  1:47 PM  PATIENT:  Olivia [name]  56 y.o. female  PRE-OPERATIVE DIAGNOSIS:  RECURRENT RIGHT BREAST CANCER  POST-OPERATIVE DIAGNOSIS:  RECURRENT RIGHT BREAST CANCER  PROCEDURE:  Procedure(s): RIGHT MASTECTOMY AND LEFT PROPHYLATIC MASTECTOMY   SURGEON:  Surgeon(s) and Role: Panel 1:    * Jovita Kussmaul, MD - Primary  Panel 2:    * Dillingham, Loel Lofty, DO - Primary  PHYSICIAN ASSISTANT:   ASSISTANTS: Sharyn Dross, RNFA   ANESTHESIA:   general  EBL:  Total I/O In: 1000 [I.V.:1000] Out: 40 [Urine:650]  BLOOD ADMINISTERED:none  DRAINS: none   LOCAL MEDICATIONS USED:  NONE  SPECIMEN:  Source of Specimen:  bilateral mastectomies  DISPOSITION OF SPECIMEN:  PATHOLOGY  COUNTS:  YES  TOURNIQUET:  * No tourniquets in log *  DICTATION: .Dragon Dictation   After informed consent was obtained the patient was brought to the operating room and placed in the supine position on the operating room table. After adequate induction of general anesthesia the patient's bilateral chest, breast, and axillary area were prepped with ChloraPrep, allowed to dry, and draped in usual sterile manner. An appropriate timeout was performed. Attention was first turned to the left breast. An elliptical incision was made around the nipple and areola complex in order to spare as much skin as possible. The incision was carried through the skin and subcutaneous tissue sharply with the plasma blade. Breast hooks were used to elevate the skin flaps anteriorly towards the ceiling. Thin skin flaps were created by dissecting between the subcutaneous tissue and the breast tissue. This dissection was carried out circumferentially all the way to the chest wall. Next the breast was removed from the pectoralis muscle with the pectoralis fascia. Once the breast was removed it was oriented with a stitch on the lateral skin and sent to pathology for further evaluation. Hemostasis was achieved using the plasma  blade. The wound was irrigated with copious amounts of saline and packed with a moistened lap sponge. The skin flaps appeared healthy. This area was covered with a sterile towel. Attention was then turned to the right breast. A similar elliptical incision was made with a 10 blade knife. The incision was carried through the skin and subcutaneous tissue sharply with the plasma blade. Breast hooks were used to elevate the skin flaps anteriorly towards the ceiling. Thin skin flaps were created circumferentially by dissecting between the subcutaneous fat and the breast tissue. This dissection was carried all the way to the chest wall circumferentially. Superiorly I did encounter some scar tissue very close to the skin along the midportion of the skin flap about 1-2 cm from the skin edge. This was marked with a 3-0 Vicryl stitch in case this area comes back positive and needs to be resected. The breast was then removed from the pectoralis muscle with the pectoralis fascia. The tumor appeared to be along the deep into the breast and I could palpate some nodularity on the deep portion of the breast tissue. Some muscle fibers were taken over top of this. There was no evidence of disease  Invading the muscle. There was no palpable nodularity in the right axilla. The breast once removed was oriented with a stitch on the lateral skin and sent to pathology for further evaluation. Hemostasis was achieved using the plasma blade. The wound was irrigated with copious amounts of saline and the cavity was packed with a moistened lap sponge. The patient tolerated this portion of the procedure well. At  the end of this portion all needle sponge and instrument counts were correct. The operation was then turned over to Dr. Marla Roe for the reconstruction which will be dictated separately.  PLAN OF CARE: Admit for overnight observation  PATIENT DISPOSITION:  PACU - hemodynamically stable.   Delay start of Pharmacological VTE agent  (>24hrs) due to surgical blood loss or risk of bleeding: no

## 2016-11-17 NOTE — Anesthesia Procedure Notes (Signed)
Procedure Name: Intubation Date/Time: 11/17/2016 11:20 AM Performed by: Salli Quarry Anayansi Rundquist Pre-anesthesia Checklist: Patient identified, Emergency Drugs available, Suction available and Patient being monitored Patient Re-evaluated:Patient Re-evaluated prior to induction Oxygen Delivery Method: Circle System Utilized Preoxygenation: Pre-oxygenation with 100% oxygen Induction Type: IV induction Ventilation: Mask ventilation without difficulty Laryngoscope Size: Mac and 3 Grade View: Grade I Tube type: Oral Tube size: 7.0 mm Number of attempts: 1 Airway Equipment and Method: Stylet and Oral airway Placement Confirmation: ETT inserted through vocal cords under direct vision,  positive ETCO2 and breath sounds checked- equal and bilateral Secured at: 22 cm Tube secured with: Tape Dental Injury: Teeth and Oropharynx as per pre-operative assessment

## 2016-11-17 NOTE — Anesthesia Procedure Notes (Signed)
Anesthesia Regional Block: Pectoralis block   Pre-Anesthetic Checklist: ,, timeout performed, Correct Patient, Correct Site, Correct Laterality, Correct Procedure, Correct Position, site marked, Risks and benefits discussed,  Surgical consent,  Pre-op evaluation,  At surgeon's request and post-op pain management  Laterality: Left  Prep: chloraprep       Needles:  Injection technique: Single-shot  Needle Type: Echogenic Stimulator Needle     Needle Length: 10cm  Needle Gauge: 21     Additional Needles:   Narrative:  Start time: 11/17/2016 10:46 AM End time: 11/17/2016 10:56 AM Injection made incrementally with aspirations every 5 mL.  Performed by: Personally

## 2016-11-17 NOTE — Anesthesia Postprocedure Evaluation (Signed)
Anesthesia Post Note  Patient: Olivia [name]  Procedure(s) Performed: RIGHT MASTECTOMY AND LEFT PROPHYLATIC MASTECTOMY (Bilateral Breast) IMEDITATE BREAST RECONSTRUCTION WITH PLACEMENT OF TISSUE EXPANDER AND FLEX HD (ACELLULAR HYDRATED DERMIS) (Bilateral )     Patient location during evaluation: PACU Anesthesia Type: General Level of consciousness: sedated Pain management: pain level controlled Vital Signs Assessment: post-procedure vital signs reviewed and stable Respiratory status: spontaneous breathing and respiratory function stable Cardiovascular status: stable Postop Assessment: no apparent nausea or vomiting Anesthetic complications: no    Last Vitals:  Vitals:   11/17/16 1557 11/17/16 1600  BP: 116/62   Pulse: 71 75  Resp: 15 16  Temp:    SpO2: 97% 97%    Last Pain:  Vitals:   11/17/16 1630  TempSrc:   PainSc: 0-No pain                 Atiba Kimberlin DANIEL

## 2016-11-17 NOTE — Op Note (Signed)
Op report    DATE OF OPERATION:  11/17/2016  LOCATION: [facility]  SURGICAL DIVISION: Plastic Surgery  PREOPERATIVE DIAGNOSES:  1. Right Breast cancer.    POSTOPERATIVE DIAGNOSES:  1. Right Breast cancer.   PROCEDURE:  1. Bilateral immediate breast reconstruction with placement of Acellular Dermal Matrix and tissue expanders. 2. Biopsy of superior flap sent 1 cm  SURGEON: Sumaya Riedesel Sanger Alexarae Oliva, DO  ASSISTANT: Shawn Rayburn, PA  ANESTHESIA:  General.   COMPLICATIONS: None.   IMPLANTS: Left - Mentor 300 cc. Ref #[id number].  Serial Number [id number] -  100 cc of saline placed Right - Mentor 300 cc. Ref #[id number].  Serial Number [id number]  - 100 cc of saline placed Acellular Dermal Matrix 4 x 16 cm two  INDICATIONS FOR PROCEDURE:  The patient, Olivia [name], is a 56 y.o. female born on [birth year], is here for  immediate first stage breast reconstruction with placement of bilateral tissue expander and Acellular dermal matrix. MRN: [id number]  CONSENT:  Informed consent was obtained directly from the patient. Risks, benefits and alternatives were fully discussed. Specific risks including but not limited to bleeding, infection, hematoma, seroma, scarring, pain, implant infection, implant extrusion, capsular contracture, asymmetry, wound healing problems, and need for further surgery were all discussed. The patient did have an ample opportunity to have her questions answered to her satisfaction.   DESCRIPTION OF PROCEDURE:  The patient was taken to the operating room by the general surgery team. SCDs were placed and IV antibiotics were given. The patient's chest was prepped and draped in a sterile fashion. A time out was performed and the implants to be used were identified.  Bilateral mastectomies were performed.  Once the general surgery team had completed their portion of the case the patient was rendered to the plastic and reconstructive surgery  team.  Left:  The pectoralis major muscle was lifted from the chest wall with release of the lateral edge and lateral inframammary fold.  The pocket was irrigated with antibiotic solution and hemostasis was achieved with electrocautery.  The ADM was then prepared according to the manufacture guidelines and slits placed to help with postoperative fluid management.  The ADM was then sutured to the inferior and lateral edge of the inframammary fold with 2-0 PDS starting with an interrupted stitch and then a running stitch.  The lateral portion was sutured to with interrupted sutures after the expander was placed.  The expander was prepared according to the manufacture guidelines, the air evacuated and then it was placed under the ADM and pectoralis major muscle.  The inferior and lateral tabs were used to secure the expander to the chest wall with 2-0 PDS.  The drain was placed at the inframammary fold over the ADM and secured to the skin with 3-0 Silk.   The deep layers were closed with 3-0 Monocryl followed by 4-0 Monocryl.  The skin was closed with 5-0 Monocryl and then dermabond was applied.   Right: The pectoralis major muscle was lifted from the chest wall with release of the lateral edge and lateral inframammary fold.  The pocket was irrigated with antibiotic solution and hemostasis was achieved with electrocautery.  The ADM was then prepared according to the manufacture guidelines and slits placed to help with postoperative fluid management.  The ADM was then sutured to the inferior and lateral edge of the inframammary fold with 2-0 PDS starting with an interrupted stitch and then a running stitch.  The lateral portion was sutured to  with interrupted sutures after the expander was placed.  The expander was prepared according to the manufacture guidelines, the air evacuated and then it was placed under the ADM and pectoralis major muscle.  The inferior and lateral tabs were used -to secure the expander to  the chest wall with 2-0 PDS.  The drain was placed at the inframammary fold over the ADM and secured to the skin with 3-0 Silk. The location of the stitch placed on the superior flap was remarked with a silk and then shaved with the #10 blade and sent to path. The deep layers were closed with 3-0 Monocryl followed by 4-0 Monocryl.  The skin was closed with 5-0 Monocryl and then dermabond was applied.  The ABDs and breast binder were placed.  The patient tolerated the procedure well and there were no complications.  The patient was allowed to wake from anesthesia and taken to the recovery room in satisfactory condition.

## 2016-11-17 NOTE — Interval H&P Note (Signed)
History and Physical Interval Note:  11/17/2016 10:27 AM  Collier Flowers  has presented today for surgery, with the diagnosis of RECURRENT RIGHT BREAST CANCER  The various methods of treatment have been discussed with the patient and family. After consideration of risks, benefits and other options for treatment, the patient has consented to  Procedure(s): BILATERAL MASTECTOMIES (Bilateral) IMEDITATE BREAST RECONSTRUCTION WITH PLACEMENT OF TISSUE EXPANDER AND FLEX HD (ACELLULAR HYDRATED DERMIS) (Bilateral) as a surgical intervention .  The patient's history has been reviewed, patient examined, no change in status, stable for surgery.  I have reviewed the patient's chart and labs.  Questions were answered to the patient's satisfaction.     TOTH III,PAUL S

## 2016-11-17 NOTE — H&P (Signed)
Olivia [name]  Location: [facility] Surgery Patient #: [id number] DOB: September 25, 1960 Undefined / Language: Cleophus Molt / Race: White Female   History of Present Illness  The patient is a 56 year old female who presents with breast cancer. we are asked to see the patient in consultation by Dr. Sondra Come to evaluate her for a new right breast cancer. The patient is a 56 year old white female who presents with a screen detected mass in the upper inner right breast. The mass measured 1.6 cm and the axilla looked negative. This was biopsied and came back as an invasive breast cancer that was ER and PR positive and HER-2 negative with a Ki-67 of 35%. She does have a history of right breast cancer in 2003 that was treated with lumpectomy and axillary node dissection followed by chemotherapy and radiation therapy. She does not smoke.   Past Surgical History  Breast Biopsy  Right. Breast Mass; Local Excision  Right. Hysterectomy (due to cancer) - Complete  Sentinel Lymph Node Biopsy   Diagnostic Studies History Colonoscopy  1-5 years ago Mammogram  within last year  Medication History  Medications Reconciled  Social History Alcohol use  Occasional alcohol use. Caffeine use  Coffee. No drug use  Tobacco use  Never smoker.  Family History Arthritis  Mother. Breast Cancer  Family Members In General. Diabetes Mellitus  Father. Heart Disease  Mother. Heart disease in female family member before age 67  Hypertension  Mother. Kidney Disease  Father. Prostate Cancer  Father. Thyroid problems  Mother.  Pregnancy / Birth History  Age at menarche  31 years. Gravida  0 Irregular periods   Other Problems  Breast Cancer  Hemorrhoids  Lump In Breast  Melanoma  Oophorectomy     Review of Systems General Not Present- Appetite Loss, Chills, Fatigue, Fever, Night Sweats, Weight Gain and Weight Loss. Skin Not Present- Change in Wart/Mole, Dryness, Hives, Jaundice,  New Lesions, Non-Healing Wounds, Rash and Ulcer. HEENT Present- Wears glasses/contact lenses. Not Present- Earache, Hearing Loss, Hoarseness, Nose Bleed, Oral Ulcers, Ringing in the Ears, Seasonal Allergies, Sinus Pain, Sore Throat, Visual Disturbances and Yellow Eyes. Respiratory Not Present- Bloody sputum, Chronic Cough, Difficulty Breathing, Snoring and Wheezing. Breast Present- Breast Mass. Not Present- Breast Pain, Nipple Discharge and Skin Changes. Gastrointestinal Present- Hemorrhoids. Not Present- Abdominal Pain, Bloating, Bloody Stool, Change in Bowel Habits, Chronic diarrhea, Constipation, Difficulty Swallowing, Excessive gas, Gets full quickly at meals, Indigestion, Nausea, Rectal Pain and Vomiting. Female Genitourinary Not Present- Frequency, Nocturia, Painful Urination, Pelvic Pain and Urgency. Musculoskeletal Not Present- Back Pain, Joint Pain, Joint Stiffness, Muscle Pain, Muscle Weakness and Swelling of Extremities. Neurological Not Present- Decreased Memory, Fainting, Headaches, Numbness, Seizures, Tingling, Tremor, Trouble walking and Weakness. Psychiatric Present- Anxiety. Not Present- Bipolar, Change in Sleep Pattern, Depression, Fearful and Frequent crying. Endocrine Not Present- Cold Intolerance, Excessive Hunger, Hair Changes, Heat Intolerance, Hot flashes and New Diabetes. Hematology Present- Gland problems. Not Present- Easy Bruising, Excessive bleeding, HIV and Persistent Infections.   Physical Exam General Mental Status-Alert. General Appearance-Consistent with stated age. Hydration-Well hydrated. Voice-Normal.  Head and Neck Head-normocephalic, atraumatic with no lesions or palpable masses. Trachea-midline. Thyroid Gland Characteristics - normal size and consistency.  Eye Eyeball - Bilateral-Extraocular movements intact. Sclera/Conjunctiva - Bilateral-No scleral icterus.  Chest and Lung Exam Chest and lung exam reveals -quiet, even and easy  respiratory effort with no use of accessory muscles and on auscultation, normal breath sounds, no adventitious sounds and normal vocal resonance. Inspection Chest Wall -  Normal. Back - normal.  Breast Note: there is a palpable firmness in the upper portion of the right breast. There is no palpable mass in the left breast. There is no palpable axillary, supraclavicular, or cervical lymphadenopathy.   Cardiovascular Cardiovascular examination reveals -normal heart sounds, regular rate and rhythm with no murmurs and normal pedal pulses bilaterally.  Abdomen Inspection Inspection of the abdomen reveals - No Hernias. Skin - Scar - no surgical scars. Palpation/Percussion Palpation and Percussion of the abdomen reveal - Soft, Non Tender, No Rebound tenderness, No Rigidity (guarding) and No hepatosplenomegaly. Auscultation Auscultation of the abdomen reveals - Bowel sounds normal.  Neurologic Neurologic evaluation reveals -alert and oriented x 3 with no impairment of recent or remote memory. Mental Status-Normal.  Musculoskeletal Normal Exam - Left-Upper Extremity Strength Normal and Lower Extremity Strength Normal. Normal Exam - Right-Upper Extremity Strength Normal and Lower Extremity Strength Normal.  Lymphatic Head & Neck  General Head & Neck Lymphatics: Bilateral - Description - Normal. Axillary  General Axillary Region: Bilateral - Description - Normal. Tenderness - Non Tender. Femoral & Inguinal  Generalized Femoral & Inguinal Lymphatics: Bilateral - Description - Normal. Tenderness - Non Tender.    Assessment & Plan  MALIGNANT NEOPLASM OF UPPER-INNER QUADRANT OF RIGHT BREAST IN FEMALE, ESTROGEN RECEPTOR POSITIVE (C50.211) Impression: the patient appears to have a new cancer in the upper right breast with a history of previous radiation to the right breast. Because of this I would recommend a right mastectomy. She will not require a node evaluation because of her  previous dissection unless there is something palpable there. Because of her history she would prefer bilateral mastectomies with reconstruction. I will refer her to plastic surgery and begin surgical planning. I have discussed with her in detail the risks and benefits of the operation as well as some of the technical aspects and she understands and wishes to proceed Current Plans Pt Education - Breast cancer: discussed with patient and provided information.

## 2016-11-18 ENCOUNTER — Encounter (HOSPITAL_COMMUNITY): Payer: Self-pay | Admitting: General Surgery

## 2016-11-18 DIAGNOSIS — Z853 Personal history of malignant neoplasm of breast: Secondary | ICD-10-CM | POA: Diagnosis not present

## 2016-11-18 DIAGNOSIS — Z4001 Encounter for prophylactic removal of breast: Secondary | ICD-10-CM | POA: Diagnosis not present

## 2016-11-18 DIAGNOSIS — Z85820 Personal history of malignant melanoma of skin: Secondary | ICD-10-CM | POA: Diagnosis not present

## 2016-11-18 DIAGNOSIS — K219 Gastro-esophageal reflux disease without esophagitis: Secondary | ICD-10-CM | POA: Diagnosis not present

## 2016-11-18 DIAGNOSIS — Z170 Estrogen receptor positive status [ER+]: Secondary | ICD-10-CM | POA: Diagnosis not present

## 2016-11-18 DIAGNOSIS — Z9221 Personal history of antineoplastic chemotherapy: Secondary | ICD-10-CM | POA: Diagnosis not present

## 2016-11-18 DIAGNOSIS — C50211 Malignant neoplasm of upper-inner quadrant of right female breast: Secondary | ICD-10-CM | POA: Diagnosis not present

## 2016-11-18 DIAGNOSIS — Z90710 Acquired absence of both cervix and uterus: Secondary | ICD-10-CM | POA: Diagnosis not present

## 2016-11-18 DIAGNOSIS — E039 Hypothyroidism, unspecified: Secondary | ICD-10-CM | POA: Diagnosis not present

## 2016-11-18 DIAGNOSIS — Z923 Personal history of irradiation: Secondary | ICD-10-CM | POA: Diagnosis not present

## 2016-11-18 DIAGNOSIS — C50911 Malignant neoplasm of unspecified site of right female breast: Secondary | ICD-10-CM | POA: Diagnosis not present

## 2016-11-18 DIAGNOSIS — N6042 Mammary duct ectasia of left breast: Secondary | ICD-10-CM | POA: Diagnosis not present

## 2016-11-18 DIAGNOSIS — Z79899 Other long term (current) drug therapy: Secondary | ICD-10-CM | POA: Diagnosis not present

## 2016-11-18 LAB — BASIC METABOLIC PANEL
ANION GAP: 5 (ref 5–15)
BUN: 13 mg/dL (ref 6–20)
CALCIUM: 8.7 mg/dL — AB (ref 8.9–10.3)
CO2: 27 mmol/L (ref 22–32)
Chloride: 107 mmol/L (ref 101–111)
Creatinine, Ser: 0.88 mg/dL (ref 0.44–1.00)
GFR calc Af Amer: >60 mL/min (ref >60)
GFR calc non Af Amer: >60 mL/min (ref >60)
GLUCOSE: 128 mg/dL — AB (ref 65–99)
Potassium: 4 mmol/L (ref 3.5–5.1)
Sodium: 139 mmol/L (ref 135–145)

## 2016-11-18 LAB — HIV ANTIBODY (ROUTINE TESTING W REFLEX): HIV Screen 4th Generation wRfx: NONREACTIVE

## 2016-11-18 LAB — CBC
HEMATOCRIT: 36.4 % (ref 36.0–46.0)
HEMOGLOBIN: 12.3 g/dL (ref 12.0–15.0)
MCH: 29.4 pg (ref 26.0–34.0)
MCHC: 33.8 g/dL (ref 30.0–36.0)
MCV: 87.1 fL (ref 78.0–100.0)
PLATELETS: 216 10*3/uL (ref 150–400)
RBC: 4.18 MIL/uL (ref 3.87–5.11)
RDW: 13.6 % (ref 11.5–15.5)
WBC: 9.7 10*3/uL (ref 4.0–10.5)

## 2016-11-18 NOTE — Discharge Summary (Signed)
Physician Discharge Summary  Patient ID: Olivia [name] MRN: [id number] DOB/AGE: 02-19-1960 56 y.o.  Admit date: 11/17/2016 Discharge date: 11/18/2016  Admission Diagnoses:  Discharge Diagnoses:  Active Problems:   Breast cancer [facility])   Discharged Condition: good  Hospital Course: The patient was taken to the OR and underwent bilateral mastectomies with immediate reconstruction.  She did very well after surgery.  She was monitored on the surgery unit.  She has one drain in each breast pocket.  She is tolerating food well, walking without difficulty and pain is well controlled.  She is requesting to go home.  She will follow up with Dr. Marla Roe in one week.  Consults: None  Significant Diagnostic Studies: none  Treatments: surgery  Discharge Exam: Blood pressure 122/74, pulse (!) 56, temperature 98.2 F (36.8 C), temperature source Oral, resp. rate 18, height 5\' 4"  (1.626 m), weight 62.9 kg (138 lb 11.2 oz), SpO2 99 %. General appearance: alert, cooperative and no distress Head: Normocephalic, without obvious abnormality, atraumatic Skin: Skin color, texture, turgor normal. No rashes or lesions Incision/Wound:  Disposition: 01-Home or Self Care  Discharge Instructions    Call MD for:  difficulty breathing, headache or visual disturbances    Complete by:  As directed    Call MD for:  persistant nausea and vomiting    Complete by:  As directed    Call MD for:  redness, tenderness, or signs of infection (pain, swelling, redness, odor or green/yellow discharge around incision site)    Complete by:  As directed    Call MD for:  severe uncontrolled pain    Complete by:  As directed    Call MD for:  temperature >100.4    Complete by:  As directed    Diet general    Complete by:  As directed    Discharge wound care:    Complete by:  As directed    Drain care   Driving Restrictions    Complete by:  As directed    No driving while on pain meds   Increase activity slowly     Complete by:  As directed    Continue binder or sports bra Sink bath   Lifting restrictions    Complete by:  As directed    No heavy lifting     Allergies as of 11/18/2016   No Known Allergies     Medication List    TAKE these medications   denosumab 60 MG/ML Soln injection Commonly known as:  PROLIA Inject 60 mg into the skin every 6 (six) months. Administer in upper arm, thigh, or abdomen   hydrocortisone 2.5 % rectal cream Commonly known as:  ANUSOL-HC Place 1 application rectally 2 (two) times daily.   hydrocortisone 25 MG suppository Commonly known as:  ANUSOL-HC Place 1 suppository (25 mg total) rectally 2 (two) times daily.   ibuprofen 200 MG tablet Commonly known as:  ADVIL,MOTRIN Take 600 mg by mouth every 6 (six) hours as needed for headache or moderate pain.   meclizine 25 MG tablet Commonly known as:  ANTIVERT Take 25 mg by mouth 3 (three) times daily as needed for dizziness.   omeprazole 20 MG capsule Commonly known as:  PRILOSEC Take 1 capsule (20 mg total) by mouth 2 (two) times daily.            Discharge Care Instructions        Start     Ordered   11/18/16 0000  Discharge wound care:  Comments:  Drain care   11/18/16 1540     Follow-up Information    Dillingham, Loel Lofty, DO Follow up in 1 week(s).   Specialty:  Plastic Surgery Contact information: [address] [place] [address] [phone]        Autumn Messing III, MD Follow up in 2 week(s).   Specialty:  General Surgery Contact information: [place] [address] [phone]           Signed: Wallace Going 11/18/2016, 3:41 PM

## 2016-11-18 NOTE — Discharge Instructions (Signed)
Breast Cancer, Female Breast cancer is an abnormal growth of tissue (tumor) in the breast that is cancerous (malignant). Unlike noncancerous (benign) tumors, malignant tumors can spread to other parts of your body. The most common type of female breast cancer begins in the milk ducts (ductal carcinoma). Breast cancer is one of the most common types of cancer in women. What are the causes? The exact cause of female breast cancer is unknown. What increases the risk?  Age older than 36 years.  Family history of breast cancer.  Having the BRCA1 and BRCA2 genes.  Personal history of radiation exposure.  Obesity.  Menstrual periods that begin before age 55 years.  Menopause that begins after age 38 years.  Pregnant for the first time at the age of 14 years or older.  Using hormone therapy.  Drinking more than one alcoholic drink per day. What are the signs or symptoms?  A painless lump in your breast.  Changes in the size or shape of your breast.  Breast skin changes, such as puckering or dimpling.  Nipple abnormalities, such as scaling, crustiness, redness, or pulling in (retraction).  Nipple discharge that is bloody or clear. How is this diagnosed? Your health care provider will ask about your medical history. He or she may also perform a number of procedures, such as:  A physical exam. This will involve feeling the tissue around the breast and under the arms.  Taking a sample of nipple discharge. The sample will be examined under a microscope.  Breast X-rays (mammogram), breast ultrasound exams, or an MRI.  Taking a tissue sample (biopsy) from the breast. The sample will be examined under a microscope to look for cancer cells.  Your cancer will be staged to determine its severity and extent. Staging is a careful attempt to find out the size of the tumor, whether the cancer has spread, and if so, to what parts of the body. You may need to have more tests to determine the  stage of your cancer:  Stage 0--The tumor has not spread to other breast tissue.  Stage I--The cancer is only found in the breast. The tumor may be up to  in (2 cm) wide.  Stage II--The cancer has spread to nearby lymph nodes. The tumor may be up to 2 in (5 cm) wide.  Stage III--The cancer has spread to more distant lymph nodes. The tumor may be larger than 2 in (5 cm) wide.  Stage IV--The cancer has spread to other parts of the body, such as the bones, brain, liver, or lungs.  How is this treated? Depending on the type and stage, female breast cancer may be treated with one or more of the following therapies:  Surgery to remove just the tumor (lumpectomy) or the entire breast (mastectomy). Lymph nodes may also be removed.  Radiation therapy, which uses high-energy rays to kill cancer cells.  Chemotherapy, which is the use of drugs to kill cancer cells.  Hormone therapy, which involves taking medicine to adjust the hormone levels in your body. You may take medicine to decrease your estrogen levels. This can help stop cancer cells from growing.  Follow these instructions at home:  Take medicines only as directed by your health care provider.  Maintain a healthy diet.  Consider joining a support group. This may help you learn to cope with the stress of having breast cancer.  Keep all follow-up appointments as directed by your health care provider. Contact a health care provider if:  You have a sudden increase in pain.  You notice a new lump in either breast or under your arm.  You develop swelling in either arm or hand.  You lose weight without trying.  You have a fever.  You notice new fatigue or weakness. Get help right away if:  You have chest pain or trouble breathing.  You faint. This information is not intended to replace advice given to you by your health care provider. Make sure you discuss any questions you have with your health care provider. Document  Released: 04/30/2005 Document Revised: 05/31/2015 Document Reviewed: 03/16/2013 Elsevier Interactive Patient Education  2017 [place]. No heavy lifting Skin bath

## 2016-11-18 NOTE — Progress Notes (Signed)
Discharge instructions were reviewed with patient and her sister. Each verbalized understanding concerning medication regimen, wound/drain care, and follow up appointments. Patient left unit via wheelchair in stable condition.

## 2016-11-18 NOTE — Progress Notes (Signed)
1 Day Post-Op   Subjective/Chief Complaint: Feels some soreness but seems manageable   Objective: Vital signs in last 24 hours: Temp:  [97.1 F (36.2 C)-98.6 F (37 C)] 98.2 F (36.8 C) (10/16 0528) Pulse Rate:  [56-101] 56 (10/16 0528) Resp:  [13-24] 16 (10/16 0528) BP: (80-135)/(36-86) 98/49 (10/16 0528) SpO2:  [96 %-100 %] 98 % (10/16 0528) Weight:  [61.7 kg (136 lb)-62.9 kg (138 lb 11.2 oz)] 62.9 kg (138 lb 11.2 oz) (10/15 1852)    Intake/Output from previous day: 10/15 0701 - 10/16 0700 In: 2018.3 [I.V.:1918.3; IV Piggyback:100] Out: 1685 [Urine:1450; Drains:85; Blood:150] Intake/Output this shift: No intake/output data recorded.  General appearance: alert and cooperative Resp: clear to auscultation bilaterally Chest wall: skin flaps look good Cardio: regular rate and rhythm GI: soft, non-tender; bowel sounds normal; no masses,  no organomegaly  Lab Results:   Recent Labs  11/18/16 0731  WBC 9.7  HGB 12.3  HCT 36.4  PLT 216   BMET No results for input(s): NA, K, CL, CO2, GLUCOSE, BUN, CREATININE, CALCIUM in the last 72 hours. PT/INR No results for input(s): LABPROT, INR in the last 72 hours. ABG No results for input(s): PHART, HCO3 in the last 72 hours.  Invalid input(s): PCO2, PO2  Studies/Results: No results found.  Anti-infectives: Anti-infectives    Start     Dose/Rate Route Frequency Ordered Stop   11/17/16 2200  ceFAZolin (ANCEF) IVPB 2g/100 mL premix     2 g 200 mL/hr over 30 Minutes Intravenous Every 8 hours 11/17/16 1851     11/17/16 1432  polymyxin B 500,000 Units, bacitracin 50,000 Units in sodium chloride 0.9 % 500 mL irrigation  Status:  Discontinued       As needed 11/17/16 1433 11/17/16 1524   11/17/16 1338  polymyxin 500,000 units/gentamicin 80 mg/0.9 % sodium chloride ophth. irrigation  Status:  Discontinued       As needed 11/17/16 1339 11/17/16 1524   11/17/16 0926  ceFAZolin (ANCEF) 2-4 GM/100ML-% IVPB    Comments:  Bobbie Stack   : cabinet override      11/17/16 0926 11/17/16 1122   11/17/16 0913  ceFAZolin (ANCEF) IVPB 2g/100 mL premix  Status:  Discontinued     2 g 200 mL/hr over 30 Minutes Intravenous On call to O.R. 11/17/16 0913 11/17/16 0928   11/17/16 0912  ceFAZolin (ANCEF) IVPB 2g/100 mL premix     2 g 200 mL/hr over 30 Minutes Intravenous On call to O.R. 11/17/16 0912 11/17/16 1122      Assessment/Plan: s/p Procedure(s): RIGHT MASTECTOMY AND LEFT PROPHYLATIC MASTECTOMY (Bilateral) IMEDITATE BREAST RECONSTRUCTION WITH PLACEMENT OF TISSUE EXPANDER AND FLEX HD (ACELLULAR HYDRATED DERMIS) (Bilateral) Advance diet  Disposition per Plastics ambulate  LOS: 0 days    TOTH III,PAUL S 11/18/2016

## 2016-11-20 ENCOUNTER — Telehealth: Payer: Self-pay | Admitting: *Deleted

## 2016-11-20 DIAGNOSIS — C50911 Malignant neoplasm of unspecified site of right female breast: Secondary | ICD-10-CM | POA: Diagnosis not present

## 2016-11-20 NOTE — Telephone Encounter (Signed)
Ordered oncotype per Dr. Burr Medico. Faxed requisition to pathology and confirmed receipt.  Faxed PA to [facility]

## 2016-11-24 DIAGNOSIS — Z9013 Acquired absence of bilateral breasts and nipples: Secondary | ICD-10-CM | POA: Insufficient documentation

## 2016-12-01 DIAGNOSIS — C50211 Malignant neoplasm of upper-inner quadrant of right female breast: Secondary | ICD-10-CM | POA: Diagnosis not present

## 2016-12-01 DIAGNOSIS — Z170 Estrogen receptor positive status [ER+]: Secondary | ICD-10-CM | POA: Diagnosis not present

## 2016-12-02 ENCOUNTER — Telehealth: Payer: Self-pay | Admitting: *Deleted

## 2016-12-02 ENCOUNTER — Encounter (HOSPITAL_COMMUNITY): Payer: Self-pay | Admitting: General Surgery

## 2016-12-02 DIAGNOSIS — C50911 Malignant neoplasm of unspecified site of right female breast: Secondary | ICD-10-CM | POA: Diagnosis not present

## 2016-12-02 NOTE — Telephone Encounter (Signed)
Received oncotype results of 21.  Team notified.

## 2016-12-03 ENCOUNTER — Encounter (HOSPITAL_COMMUNITY): Payer: Self-pay

## 2016-12-03 ENCOUNTER — Encounter (HOSPITAL_COMMUNITY): Payer: Self-pay | Admitting: General Surgery

## 2016-12-03 NOTE — OR Nursing (Signed)
I corrected serial # on 2nd flex HD implant.

## 2016-12-11 ENCOUNTER — Telehealth: Payer: Self-pay | Admitting: Nurse Practitioner

## 2016-12-11 ENCOUNTER — Encounter: Payer: Self-pay | Admitting: Hematology

## 2016-12-11 ENCOUNTER — Ambulatory Visit (HOSPITAL_BASED_OUTPATIENT_CLINIC_OR_DEPARTMENT_OTHER): Payer: BLUE CROSS/BLUE SHIELD | Admitting: Hematology

## 2016-12-11 VITALS — BP 120/73 | HR 65 | Temp 98.1°F | Resp 18 | Ht 64.0 in | Wt 138.2 lb

## 2016-12-11 DIAGNOSIS — Z170 Estrogen receptor positive status [ER+]: Secondary | ICD-10-CM | POA: Diagnosis not present

## 2016-12-11 DIAGNOSIS — M818 Other osteoporosis without current pathological fracture: Secondary | ICD-10-CM

## 2016-12-11 DIAGNOSIS — M810 Age-related osteoporosis without current pathological fracture: Secondary | ICD-10-CM | POA: Diagnosis not present

## 2016-12-11 DIAGNOSIS — C50211 Malignant neoplasm of upper-inner quadrant of right female breast: Secondary | ICD-10-CM

## 2016-12-11 MED ORDER — LETROZOLE 2.5 MG PO TABS: 2.5000 mg | ORAL_TABLET | Freq: Every day | ORAL | 3 refills | Status: DC

## 2016-12-11 NOTE — Telephone Encounter (Signed)
Gave avs and calendar for December  °

## 2016-12-11 NOTE — Progress Notes (Signed)
[place]  [phone] [phone]) [phone]  Clinic Follow up Note   Patient Care Team: Olivia Hillock, DO as PCP - General (Family Medicine) Olivia Merle, MD as Consulting Physician (Hematology) Olivia Kussmaul, MD as Consulting Physician (General Surgery) Olivia Pray, MD as Consulting Physician (Radiation Oncology) 12/11/2016  SUMMARY OF ONCOLOGIC HISTORY: Oncology History   Cancer Staging Malignant neoplasm of upper-inner quadrant of right breast in female, estrogen receptor positive ([place]) Staging form: Breast, AJCC 8th Edition - Clinical stage from 10/09/2016: Stage IA (cT1c, cN0, cM0, G3, ER: Positive, PR: Positive, HER2: Negative) - Signed by Olivia Merle, MD on 10/14/2016 - Pathologic stage from 11/17/2016: Stage IB (pT2, pN0, cM0, G3, ER: Positive, PR: Positive, HER2: Negative, Oncotype DX score: 21) - Signed by Olivia Merle, MD on 12/11/2016       Malignant neoplasm of upper-inner quadrant of right breast in female, estrogen receptor positive ([place])   10/08/2016 Mammogram    Diagnostic mammogram and ultrasound showed an irregular mass in the right breast at 1 o'clock, 5 cm from the nipple measuring 1.6 x 1.4x 1.6 cm, axilla was negative for adenopathy.      10/09/2016 Receptors her2    Estrogen Receptor: 80%, POSITIVE, STRONG STAINING INTENSITY Progesterone Receptor: 30%, POSITIVE, STRONG STAINING INTENSITY Proliferation Marker Ki67: 35% HER2 NEGATIVE       10/09/2016 Initial Diagnosis    Malignant neoplasm of upper-inner quadrant of right breast in female, estrogen receptor positive ([place])      10/09/2016 Initial Biopsy    Diagnosis Breast, right, needle core biopsy, upper inner quadrant, 1:00 o'clock - INVASIVE DUCTAL CARCINOMA, G3      10/2016 Genetic Testing    Genetic testing was negative for The genes analyzed were the 23 genes on [facility]'s Breast/GYN panel (ATM, BARD1, BRCA1, BRCA2, BRIP1, CDH1, CHEK2, DICER1, EPCAM, MLH1,  MSH2, MSH6, NBN, NF1,  PALB2, PMS2, PTEN, RAD50, RAD51C, RAD51D,SMARCA4, STK11, and TP53).        11/17/2016 Surgery    RIGHT MASTECTOMY AND LEFT PROPHYLATIC MASTECTOMY by Dr. Marlou [name]  IMEDITATE BREAST RECONSTRUCTION WITH PLACEMENT OF TISSUE EXPANDER AND FLEX HD (ACELLULAR HYDRATED DERMIS) by Dr. Marla [name]        11/17/2016 Pathology Results    Diagnosis 11/17/16 1. Breast, simple mastectomy, Left - BENIGN BREAST TISSUE WITH ECTATIC DUCTS. - ONE OF ONE LYMPH NODE NEGATIVE FOR CARCINOMA (0/1). 2. Breast, simple mastectomy, Right - INVASIVE DUCTAL CARCINOMA, GRADE 3, SPANNING 2.4 CM. - HIGH GRADE DUCTAL CARCINOMA IN SITU. - INVASIVE CARCINOMA IS <0.1 CM OF THE POSTERIOR MARGIN (BROADLY) AND ANTERIOR MARGIN (FOCALLY). - IN SITU CARCINOMA IS >0.2 CM OF BOTH MARGINS. - PRIOR LUMPECTOMY SITE, NEGATIVE FOR CARCINOMA. - SEE ONCOLOGY TABLE. 3. Breast, excision, Superior flap deep margin - BENIGN FIBROADIPOSE TISSUE.      11/17/2016 Miscellaneous    Oncotype on 11/17/16 Rcurence Risk Score of 21 10-Year risk of distant reucrrence with Tamoxifen alone is 13%      CURRENT THERAPY: Pending letrozole, to begin next week ~12/18/16  INTERVAL HISTORY: Olivia [name] returns today as scheduled following bilateral breast mastectomy with immediate reconstruction with tissue expanders on 11/17/2016 per Drs. Olivia [name] and Olivia [name], respectively.  She is here to discuss pathology and Oncotype.  Surgical drains were removed 2 weeks after procedure, had surgical follow-up and tissue expander injection on 12/05/2016. She is recovering well from surgery, takes about 4 days to adjust to discomfort from injection.  She continues to use hydrocodone and Valium sparingly only at  night on injection days to help with sleep.  Movement remains limited, she has not started PT yet.  She reports good energy level and appetite, she has started walking again.  REVIEW OF SYSTEMS:   Constitutional: Denies fevers, chills or abnormal weight loss.   Appetite Eyes: Denies blurriness of vision Ears, nose, mouth, throat, and face: Denies mucositis or sore throat Respiratory: Denies cough, dyspnea or wheezes Cardiovascular: Denies palpitation, chest discomfort or lower extremity swelling Gastrointestinal:  Denies nausea, vomiting, constipation, diarrhea, heartburn or change in bowel habits Skin: Denies abnormal skin rashes Lymphatics: Denies new lymphadenopathy or easy bruising Neurological:Denies numbness, tingling or new weaknesses Behavioral/Psych: Mood is stable, no new changes  Breasts: (+) S/P bilateral mastectomy with immediate reconstruction with tissue expanders on 11/17/2016 All other systems were reviewed with the patient and are negative.  MEDICAL HISTORY:  Past Medical History:  Diagnosis Date  . Breast cancer ([place])    lumpectomy; chemotherapy and radiation  . Family history of breast cancer   . Genetic testing 10/30/2016   Breast/GYN panel (23 genes) @ [facility] - No pathogenic mutations detected  . GERD (gastroesophageal reflux disease)    OTC medication  . Heart murmur    as a child - Echo in early 20s "ok"  . History of echocardiogram    Echo 4/17: EF 55-60%, GLS -19.6%, normal wall motion  . History of exercise stress test    ETT 4/17 - No ST changes  . HLD (hyperlipidemia)   . Hypothyroidism   . Osteoporosis   . Personal history of chemotherapy   . Personal history of radiation therapy     SURGICAL HISTORY: Past Surgical History:  Procedure Laterality Date  . BREAST LUMPECTOMY Right 2003  . BREAST SURGERY  2003 /right   lumpectomy; chemotherapy and radiation  . TOTAL ABDOMINAL HYSTERECTOMY W/ BILATERAL SALPINGOOPHORECTOMY     for treatment of breast treatment    I have reviewed the social history and family history with the patient and they are unchanged from previous note.  ALLERGIES:  has No Known Allergies.  MEDICATIONS:  Current Outpatient Medications  Medication Sig Dispense Refill  .  denosumab (PROLIA) 60 MG/ML SOLN injection Inject 60 mg into the skin every 6 (six) months. Administer in upper arm, thigh, or abdomen 1 Syringe 1  . diazepam (VALIUM) 5 MG tablet TAKE 1 TABLET BY MOUTH EVERY 8 HOURS AS NEEDED FOR ANXIETY    . HYDROcodone-acetaminophen (NORCO/VICODIN) 5-325 MG tablet every 8 (eight) hours as needed.  0  . ibuprofen (ADVIL,MOTRIN) 200 MG tablet Take 600 mg by mouth every 6 (six) hours as needed for headache or moderate pain.    Marland Kitchen letrozole (FEMARA) 2.5 MG tablet Take 1 tablet (2.5 mg total) daily by mouth. 30 tablet 3  . meclizine (ANTIVERT) 25 MG tablet Take 25 mg by mouth 3 (three) times daily as needed for dizziness.    Marland Kitchen omeprazole (PRILOSEC) 20 MG capsule Take 1 capsule (20 mg total) by mouth 2 (two) times daily. (Patient not taking: Reported on 12/11/2016) 60 capsule 1   No current facility-administered medications for this visit.     PHYSICAL EXAMINATION: ECOG PERFORMANCE STATUS: 1 - Symptomatic but completely ambulatory  Vitals:   12/11/16 1413  BP: 120/73  Pulse: 65  Resp: 18  Temp: 98.1 F (36.7 C)  SpO2: 100%   Filed Weights   12/11/16 1413  Weight: 138 lb 3.2 oz (62.7 kg)    GENERAL:alert, no distress and comfortable SKIN: skin color,  texture, turgor are normal, no rashes or significant lesions EYES: normal, Conjunctiva are pink and non-injected, sclera clear OROPHARYNX:no exudate, no erythema and lips, buccal mucosa, and tongue normal  NECK: supple, thyroid normal size, non-tender, without nodularity LYMPH:  no palpable lymphadenopathy  LUNGS: clear to auscultation bilaterally with normal breathing effort HEART: regular rate & rhythm and no murmurs and no lower extremity edema ABDOMEN:abdomen soft, non-tender and normal bowel sounds Musculoskeletal:no cyanosis of digits and no clubbing  NEURO: alert & oriented x 3 with fluent speech, no focal motor/sensory deficits BREAST: bilateral mastectomy incisions are closed, healing well  without erythema or drainage.   LABORATORY DATA:  I have reviewed the data as listed CBC Latest Ref Rng & Units 11/18/2016 11/11/2016 10/15/2016  WBC 4.0 - 10.5 K/uL 9.7 7.2 5.6  Hemoglobin 12.0 - 15.0 g/dL 12.3 13.9 14.4  Hematocrit 36.0 - 46.0 % 36.4 41.7 43.1  Platelets 150 - 400 K/uL 216 237 227     CMP Latest Ref Rng & Units 11/18/2016 11/11/2016 10/15/2016  Glucose 65 - 99 mg/dL 128(H) 123(H) 109  BUN 6 - 20 mg/dL 13 17 20.7  Creatinine 0.44 - 1.00 mg/dL 0.88 0.90 1.0  Sodium 135 - 145 mmol/L 139 136 141  Potassium 3.5 - 5.1 mmol/L 4.0 3.8 3.8  Chloride 101 - 111 mmol/L 107 101 -  CO2 22 - 32 mmol/L '27 25 27  '$ Calcium 8.9 - 10.3 mg/dL 8.7(L) 8.8(L) 9.6  Total Protein 6.4 - 8.3 g/dL - - 7.5  Total Bilirubin 0.20 - 1.20 mg/dL - - 0.78  Alkaline Phos 40 - 150 U/L - - 50  AST 5 - 34 U/L - - 20  ALT 0 - 55 U/L - - 18   PATHOLOGY   Diagnosis 11/17/16  1. Breast, simple mastectomy, Left - BENIGN BREAST TISSUE WITH ECTATIC DUCTS. - ONE OF ONE LYMPH NODE NEGATIVE FOR CARCINOMA (0/1). 2. Breast, simple mastectomy, Right - INVASIVE DUCTAL CARCINOMA, GRADE 3, SPANNING 2.4 CM. - HIGH GRADE DUCTAL CARCINOMA IN SITU. - INVASIVE CARCINOMA IS <0.1 CM OF THE POSTERIOR MARGIN (BROADLY) AND ANTERIOR MARGIN (FOCALLY). - IN SITU CARCINOMA IS >0.2 CM OF BOTH MARGINS. - PRIOR LUMPECTOMY SITE, NEGATIVE FOR CARCINOMA. - SEE ONCOLOGY TABLE. 3. Breast, excision, Superior flap deep margin - BENIGN FIBROADIPOSE TISSUE. Microscopic Comment 2. BREAST, INVASIVE TUMOR Procedure: Right mastectomy. Laterality: Right. Tumor Size: 2.4 cm. Histologic Type: Invasive ductal carcinoma. Grade: 3 Tubular Differentiation: 3 Nuclear Pleomorphism: 2 Mitotic Count: 3 Ductal Carcinoma in Situ (DCIS): Present, high grade. Extent of Tumor: Tumor abuts skeletal muscle but does not invade it. Margins: Invasive carcinoma, distance from closest margin: <0.1 of posterior (broadly) and anterior (Focally). DCIS,  distance from closest margin: >0.2 cm all margins. Regional Lymph Nodes: None. Breast Prognostic Profile: Performed on biopsy [id number], see below. Will not be repeated. Estrogen Receptor: Positive, 80% strong staining. Progesterone Receptor: Positive, 30% strong staining. 1 of 3 FINAL for Olivia [name], Olivia [name] [phone]) Microscopic Comment(continued) Her2: Negative (ratio 1.14). Ki-67: 35% Best tumor block for sendout testing: 2B Pathologic Stage Classification (pTNM, AJCC 8th Edition): Primary Tumor (pT): pT2 Regional Lymph Nodes (pN): pN0 Distant Metastases (pM): pMX Comments: None.   Diagnosis 10/09/16 Breast, right, needle core biopsy, upper inner quadrant, 1:00 o'clock - INVASIVE DUCTAL CARCINOMA, SEE COMMENT. Microscopic Comment The carcinoma appears grade 3. Prognostic markers will be ordered. Dr. Lyndon Code has reviewed the case. The case was called to [facility] on 10/10/2016.   05/17/2001 Lumpectomy from  prior breast cancer  FINAL DIAGNOSIS MICROSCOPIC EXAMINATION AND DIAGNOSIS 1. SENTINEL LYMPH NODE BIOPSY, RIGHT AXILLARY LYMPH NODE: ONE LYMPH NODE, POSITIVE FOR METASTATIC CARCINOMA (1/1) SEE COMMENT. 2. LUMPECTOMY, RIGHT BREAST: - INVASIVE MAMMARY CARCINOMA, NO SPECIAL TYPE (DUCTAL), 2.3 CM, MSBR GRADE 3/3. - DUCTAL CARCINOMA IN SITU, INTERMEDIATE GRADE. - EXTENSIVE INTRADUCTAL COMPONENT NEGATIVE (JCRT) - LYMPHOVASCULAR INVASION IDENTIFIED. - SEE COMMENT. 3. AXILLARY DISSECTION, RIGHT AXILLA: TWO OF SIX LYMPH NODES POSITIVE FOR METASTATIC TUMOR (2/6). SEE COMMENT. COMMENT 1. Tumor is seen within vessels adjacent to lymph node capsule. No definitive extension beyond capsule into soft tissues is identified. The largest extent of metastatic tumor is 0.9 cm. 2. Invasive tumor approaches to within 1 mm of the deep aspect of the specimen and 4 mm of both the anterior and superior aspects of the specimen. Despite these relatively  close margins, it appears that the tumor has been completely excised. Fragments of skeletal muscle are seen at the deep aspect of the specimen and tumor is not seen to extend into skeletal muscle. Ductal carcinoma in situ approaches to within 3 mm of the posterior margin and 5 mm of the anterior margin. Ductal carcinoma is confined to the area of invasive tumor in the sections examined. A breast prognostic marker panel has already been issued for this tumor ([id number]). It shows that the invasive component is positive for estrogen and progesterone receptors and negative for Her 2 neu over expression is measures by the Herceptest method. For complete details please see the original report. 3. Extracapsular extension in not identified. The largest focus of metastatic tumor measures 1.2 cm. ONCOLOGY TABLE-BREAST, INCISIONAL/EXCISIONAL BIOPSY OR MASTECTOMY WITH LYMPH NODES 1. Maximum tumor size (cm): 2.3 cm 2. Margins: Invasive component, distance to closest margin: 48m, posterior In situ component, distance to closest margin: 373m posterior 3. Vascular/Lymphatic invasion: Identified 4. Histology, invasive component: No special type (ductal). 5. Grade, invasive component (Elston-Ellis modified Scarff-Bloom-Richardson): 3 of 3 Tubule formation grade: 3 of 3 Nuclear pleomorphism grade: 2 of 3 Mitotic grade: 3 of 3 6. % In situ: Less than 5 % of the measured component. 7. Histology, in situ component: Solid pattern 8. Grade, in situ component: Intermediate grade 9. Multicentric (separate tumors in different quadrants): No 10. Multifocal (separate tumors in same quadrant or biopsy): No 11. Axillary lymph nodes: #examined: 7 #with metastasis: 3 ITC (isolated tumor cells, < 0.31m71m 0 Micrometastasis (> 0.31mm9m 31mm)16m Metastasis > 2 mm: 3 Extracapsular extension: Not identified. 12. TNM Code: pT2, pN1, pMX   ONCOTYPE  11/17/16    RADIOGRAPHIC STUDIES: I have personally reviewed the radiological images as listed and agreed with the findings in the report. ImagingResults  No results found.    ASSESSMENT & PLAN:  Olivia [name] 56 y.78  postmenopausal Caucasian female with a history of Heart murmur, HLD, and osteoporosis.  1. Malignant neoplasm of upper-inner quadrant of right breast, invasive ductal carcinoma, pT2N0, M0), Stage 1B, ER/PR: positive, HER2 negative, Grade 3, Oncotype RS 21 2. Genetic testing negative  3. Osteoporosis  Ms. CraveBermanars to be recovering well from bilateral mastectomy and reconstruction on 11/17/16. We reviewed her pathology and oncotype results in detail. She had ER/PR postitive, HER2 negative, node negative disease, oncotype score is 21, intermediate risk. Based on current data, there is little benefit of adjuvant chemo plus endocrine therapy over endocrine therapy alone; based on this, she is not being offered adjuvant chemotherapy. She had mastectomy, therefore will not require adjuvant radiation.  For her previous breast cancer in 2003, she received adjuvant tamoxifen x2 years then switched to arimidex x3 years after oophorectomy in 2006. She experienced joint aches, weight gain, hypertension, and elevated cholesterol on tamoxifen; tolerated AI much better. Her current breast cancer is likely a second primary, although there is small chance this is recurrence from previous right breast cancer in 2003. Will obtain CT CAP and bone scan to rule out distant metastasis. She is osteoporotic per DEXA, last done in 2016, gets prolia q6 months last on 09/25/2016. She has not had pathologic fracture. Will obtain repeat DEXA to monitor bone density. She is otherwise recovering well from surgery. She can begin adjuvant endocrine therapy with letrozole next week, I sent prescription today. She can continue endocrine therapy during anticipated tissue expander exchange procedure in  01/2017. She will return for lab and f/u in 4-6 weeks.    PLAN: -CT CAP, bone scan, DEXA in 1-2 weeks, orders placed today -begin letrozole next week, prescription sent today -return in 4-6 weeks -tissue expander exchange planned in Dec per Dr. Belinda Block NP  Addendum  I have seen the patient, examined her. I agree with the assessment and and plan and have edited the notes.   Ms. Dettmer has recovered well from her surgery.  I reviewed her surgical pathology and Oncotype test results.  According to the most recent published Tailor X study result, there is no clinical benefit for adjuvant chemotherapy if Oncotype RS 11-25, especially in women age of 13 or above.  So I do not recommend adjuvant chemotherapy.  I recommend her to start adjuvant aromatase inhibitor, due to her prior history of breast cancer, I recommend her to take AI for 7-10 years. Potential side effects reviewed with her again, especially osteoporosis, which she did develop during her prior adjuvant anastrozole.  We will repeat her bone density scan next months.  She is on Prolia injection every 6 months.  I encourage her to continue calcium, vitamin D and exercise. Will get restaging scan to rule out distant recurrence.   Orders Placed This Encounter  Procedures  . CT Abdomen Pelvis W Contrast    Standing Status:   Future    Standing Expiration Date:   12/11/2017    Order Specific Question:   If indicated for the ordered procedure, I authorize the administration of contrast media per Radiology protocol    Answer:   Yes    Order Specific Question:   Is patient pregnant?    Answer:   No    Order Specific Question:   Preferred imaging location?    Answer:   [facility]    Order Specific Question:   Radiology Contrast Protocol - do NOT remove file path    Answer:   [email or web address]    Order Specific Question:   Reason for Exam additional comments    Answer:   second right  breast cancer s/p mastectomy, rule out distant metastasis  . CT Chest W Contrast    Standing Status:   Future    Standing Expiration Date:   12/11/2017    Order Specific Question:   If indicated for the ordered procedure, I authorize the administration of contrast media per Radiology protocol    Answer:   Yes    Order Specific Question:   Is patient pregnant?    Answer:   No    Order Specific Question:   Preferred imaging location?    Answer:   [facility]    Order Specific Question:   Radiology Contrast Protocol - do NOT remove file path    Answer:   [email or web address]    Order Specific Question:   Reason for Exam additional comments    Answer:   second right breast cancer s/p mastectomy, rule out distant metastasis  . NM Bone Scan Whole Body    Standing Status:   Future    Standing Expiration Date:   12/11/2017    Order Specific Question:   If indicated for the ordered procedure, I authorize the administration of a radiopharmaceutical per Radiology protocol    Answer:   Yes    Order Specific Question:   Is the patient pregnant?    Answer:   No    Order Specific Question:   Preferred imaging location?    Answer:   [facility]    Order Specific Question:   Radiology Contrast Protocol - do NOT remove file path    Answer:   [email or web address]    Order Specific Question:   Reason for Exam additional comments    Answer:   second right breast cancer s/p mastectomy, rule out distant metastasis    Order Specific Question:   Is patient pregnant?    Answer:   No  . DG Bone Density    Standing Status:   Future    Standing Expiration Date:   12/11/2017    Order Specific Question:   Reason for Exam (SYMPTOM  OR DIAGNOSIS REQUIRED)    Answer:   follow up osteoporosis, postmenopausal, will restart enti-estrogen therapy    Order Specific Question:   Is the patient pregnant?    Answer:   No    Order Specific Question:   Preferred  imaging location?    Answer:   [facility]  . CBC with Differential    Standing Status:   Standing    Number of Occurrences:   30    Standing Expiration Date:   12/10/2021  . Comprehensive metabolic panel    Standing Status:   Standing    Number of Occurrences:   30    Standing Expiration Date:   12/10/2021   All questions were answered. The patient knows to call the clinic with any problems, questions or concerns. No barriers to learning was detected.     Olivia Merle, MD 12/11/16

## 2016-12-15 ENCOUNTER — Telehealth: Payer: Self-pay | Admitting: *Deleted

## 2016-12-15 NOTE — Telephone Encounter (Signed)
Spoke to pt regarding scands. Discussed oral prep. Denies further questions or needs at this time.

## 2016-12-24 ENCOUNTER — Ambulatory Visit (HOSPITAL_COMMUNITY)
Admission: RE | Admit: 2016-12-24 | Discharge: 2016-12-24 | Disposition: A | Discharge status: Home / Self Care | Payer: BLUE CROSS/BLUE SHIELD | Source: Ambulatory Visit | Attending: Nurse Practitioner | Admitting: Nurse Practitioner

## 2016-12-24 ENCOUNTER — Encounter (HOSPITAL_COMMUNITY): Payer: Self-pay

## 2016-12-24 ENCOUNTER — Encounter (HOSPITAL_COMMUNITY)
Admission: RE | Admit: 2016-12-24 | Discharge: 2016-12-24 | Disposition: A | Discharge status: Home / Self Care | Payer: BLUE CROSS/BLUE SHIELD | Source: Ambulatory Visit | Attending: Nurse Practitioner | Admitting: Nurse Practitioner

## 2016-12-24 DIAGNOSIS — C50211 Malignant neoplasm of upper-inner quadrant of right female breast: Secondary | ICD-10-CM

## 2016-12-24 DIAGNOSIS — Z170 Estrogen receptor positive status [ER+]: Secondary | ICD-10-CM

## 2016-12-24 DIAGNOSIS — Z9013 Acquired absence of bilateral breasts and nipples: Secondary | ICD-10-CM | POA: Diagnosis not present

## 2016-12-24 DIAGNOSIS — R109 Unspecified abdominal pain: Secondary | ICD-10-CM | POA: Diagnosis not present

## 2016-12-24 DIAGNOSIS — E042 Nontoxic multinodular goiter: Secondary | ICD-10-CM | POA: Diagnosis not present

## 2016-12-24 DIAGNOSIS — C50911 Malignant neoplasm of unspecified site of right female breast: Secondary | ICD-10-CM | POA: Diagnosis not present

## 2016-12-24 MED ORDER — IOPAMIDOL (ISOVUE-300) INJECTION 61%
INTRAVENOUS | Status: AC
  Administered 2016-12-24: 100 mL via INTRAVENOUS
  Filled 2016-12-24: qty 100

## 2016-12-24 MED ORDER — IOPAMIDOL (ISOVUE-300) INJECTION 61%
100.0000 mL | Freq: Once | INTRAVENOUS | Status: AC | PRN
Start: 2016-12-24 — End: 2016-12-24
  Administered 2016-12-24: 100 mL via INTRAVENOUS

## 2016-12-24 MED ORDER — TECHNETIUM TC 99M MEDRONATE IV KIT
21.4000 | PACK | Freq: Once | INTRAVENOUS | Status: AC | PRN
  Administered 2016-12-24: 21.4 via INTRAVENOUS

## 2017-01-22 ENCOUNTER — Encounter: Payer: Self-pay | Admitting: Nurse Practitioner

## 2017-01-22 ENCOUNTER — Telehealth: Payer: Self-pay | Admitting: Hematology

## 2017-01-22 ENCOUNTER — Other Ambulatory Visit (HOSPITAL_BASED_OUTPATIENT_CLINIC_OR_DEPARTMENT_OTHER): Payer: BLUE CROSS/BLUE SHIELD

## 2017-01-22 ENCOUNTER — Ambulatory Visit (HOSPITAL_BASED_OUTPATIENT_CLINIC_OR_DEPARTMENT_OTHER): Payer: BLUE CROSS/BLUE SHIELD | Admitting: Nurse Practitioner

## 2017-01-22 VITALS — BP 126/84 | HR 73 | Temp 98.6°F | Resp 18 | Ht 64.0 in | Wt 141.2 lb

## 2017-01-22 DIAGNOSIS — C50211 Malignant neoplasm of upper-inner quadrant of right female breast: Secondary | ICD-10-CM | POA: Diagnosis not present

## 2017-01-22 DIAGNOSIS — M810 Age-related osteoporosis without current pathological fracture: Secondary | ICD-10-CM

## 2017-01-22 DIAGNOSIS — E041 Nontoxic single thyroid nodule: Secondary | ICD-10-CM | POA: Diagnosis not present

## 2017-01-22 DIAGNOSIS — M818 Other osteoporosis without current pathological fracture: Secondary | ICD-10-CM

## 2017-01-22 DIAGNOSIS — Z170 Estrogen receptor positive status [ER+]: Secondary | ICD-10-CM | POA: Diagnosis not present

## 2017-01-22 DIAGNOSIS — C50911 Malignant neoplasm of unspecified site of right female breast: Secondary | ICD-10-CM | POA: Diagnosis not present

## 2017-01-22 DIAGNOSIS — Z79811 Long term (current) use of aromatase inhibitors: Secondary | ICD-10-CM | POA: Diagnosis not present

## 2017-01-22 LAB — CBC WITH DIFFERENTIAL/PLATELET
BASO%: 0.7 % (ref 0.0–2.0)
BASOS ABS: 0 10*3/uL (ref 0.0–0.1)
EOS%: 1.5 % (ref 0.0–7.0)
Eosinophils Absolute: 0.1 10*3/uL (ref 0.0–0.5)
HCT: 41.7 % (ref 34.8–46.6)
HGB: 13.9 g/dL (ref 11.6–15.9)
LYMPH%: 30.2 % (ref 14.0–49.7)
MCH: 28.7 pg (ref 25.1–34.0)
MCHC: 33.4 g/dL (ref 31.5–36.0)
MCV: 85.9 fL (ref 79.5–101.0)
MONO#: 0.4 10*3/uL (ref 0.1–0.9)
MONO%: 7.3 % (ref 0.0–14.0)
NEUT#: 2.9 10*3/uL (ref 1.5–6.5)
NEUT%: 60.3 % (ref 38.4–76.8)
Platelets: 252 10*3/uL (ref 145–400)
RBC: 4.85 10*6/uL (ref 3.70–5.45)
RDW: 13.7 % (ref 11.2–14.5)
WBC: 4.9 10*3/uL (ref 3.9–10.3)
lymph#: 1.5 10*3/uL (ref 0.9–3.3)

## 2017-01-22 LAB — COMPREHENSIVE METABOLIC PANEL
ALT: 35 U/L (ref 0–55)
AST: 29 U/L (ref 5–34)
Albumin: 4.5 g/dL (ref 3.5–5.0)
Alkaline Phosphatase: 58 U/L (ref 40–150)
Anion Gap: 10 mEq/L (ref 3–11)
BUN: 18 mg/dL (ref 7.0–26.0)
CHLORIDE: 104 meq/L (ref 98–109)
CO2: 27 mEq/L (ref 22–29)
Calcium: 9.7 mg/dL (ref 8.4–10.4)
Creatinine: 1 mg/dL (ref 0.6–1.1)
EGFR: >60 mL/min/{1.73_m2} (ref >60)
Glucose: 84 mg/dl (ref 70–140)
POTASSIUM: 4.4 meq/L (ref 3.5–5.1)
Sodium: 141 mEq/L (ref 136–145)
Total Bilirubin: 0.47 mg/dL (ref 0.20–1.20)
Total Protein: 7.5 g/dL (ref 6.4–8.3)

## 2017-01-22 NOTE — Telephone Encounter (Signed)
Scheduled appt per 12/20 los - patient my chart active - did not want calender or after visit summary printed out - patient aware of appt

## 2017-01-22 NOTE — Progress Notes (Signed)
[facility]  [phone] [phone]  Clinic Follow up Note   Patient Care Team: Ma Hillock, DO as PCP - General (Family Medicine) Truitt Merle, MD as Consulting Physician (Hematology) Jovita Kussmaul, MD as Consulting Physician (General Surgery) Gery Pray, MD as Consulting Physician (Radiation Oncology) 01/22/2017  CHIEF COMPLAINT: F/u R breast cancer  SUMMARY OF ONCOLOGIC HISTORY: Oncology History   Cancer Staging Malignant neoplasm of upper-inner quadrant of right breast in female, estrogen receptor positive ([place]) Staging form: Breast, AJCC 8th Edition - Clinical stage from 10/09/2016: Stage IA (cT1c, cN0, cM0, G3, ER: Positive, PR: Positive, HER2: Negative) - Signed by Truitt Merle, MD on 10/14/2016 - Pathologic stage from 11/17/2016: Stage IB (pT2, pN0, cM0, G3, ER: Positive, PR: Positive, HER2: Negative, Oncotype DX score: 21) - Signed by Truitt Merle, MD on 12/11/2016       Malignant neoplasm of upper-inner quadrant of right breast in female, estrogen receptor positive ([place])   10/08/2016 Mammogram    Diagnostic mammogram and ultrasound showed an irregular mass in the right breast at 1 o'clock, 5 cm from the nipple measuring 1.6 x 1.4x 1.6 cm, axilla was negative for adenopathy.      10/09/2016 Receptors her2    Estrogen Receptor: 80%, POSITIVE, STRONG STAINING INTENSITY Progesterone Receptor: 30%, POSITIVE, STRONG STAINING INTENSITY Proliferation Marker Ki67: 35% HER2 NEGATIVE       10/09/2016 Initial Diagnosis    Malignant neoplasm of upper-inner quadrant of right breast in female, estrogen receptor positive ([place])      10/09/2016 Initial Biopsy    Diagnosis Breast, right, needle core biopsy, upper inner quadrant, 1:00 o'clock - INVASIVE DUCTAL CARCINOMA, G3      10/2016 Genetic Testing    Genetic testing was negative for The genes analyzed were the 23 genes on [facility]'s Breast/GYN panel (ATM, BARD1, BRCA1, BRCA2, BRIP1, CDH1, CHEK2,  DICER1, EPCAM, MLH1,  MSH2, MSH6, NBN, NF1, PALB2, PMS2, PTEN, RAD50, RAD51C, RAD51D,SMARCA4, STK11, and TP53).        11/17/2016 Surgery    RIGHT MASTECTOMY AND LEFT PROPHYLATIC MASTECTOMY by Dr. Marlou Starks  IMEDITATE BREAST RECONSTRUCTION WITH PLACEMENT OF TISSUE EXPANDER AND FLEX HD (ACELLULAR HYDRATED DERMIS) by Dr. Marla Roe        11/17/2016 Pathology Results    Diagnosis 11/17/16 1. Breast, simple mastectomy, Left - BENIGN BREAST TISSUE WITH ECTATIC DUCTS. - ONE OF ONE LYMPH NODE NEGATIVE FOR CARCINOMA (0/1). 2. Breast, simple mastectomy, Right - INVASIVE DUCTAL CARCINOMA, GRADE 3, SPANNING 2.4 CM. - HIGH GRADE DUCTAL CARCINOMA IN SITU. - INVASIVE CARCINOMA IS <0.1 CM OF THE POSTERIOR MARGIN (BROADLY) AND ANTERIOR MARGIN (FOCALLY). - IN SITU CARCINOMA IS >0.2 CM OF BOTH MARGINS. - PRIOR LUMPECTOMY SITE, NEGATIVE FOR CARCINOMA. - SEE ONCOLOGY TABLE. 3. Breast, excision, Superior flap deep margin - BENIGN FIBROADIPOSE TISSUE.      11/17/2016 Miscellaneous    Oncotype on 11/17/16 Rcurence Risk Score of 21 10-Year risk of distant reucrrence with Tamoxifen alone is 13%       12/24/2016 Imaging    BONE SCAN IMPRESSION: No evidence of osseous metastatic disease.      12/24/2016 Imaging    CT CAP IMPRESSION: 1. Status post bilateral modified radical mastectomy and breast reconstruction, as above. No findings to suggest metastatic disease in the chest, abdomen or pelvis. 2. Multiple heterogeneous appearing thyroid nodules, as discussed above. These are nonspecific but further evaluation with nonemergent thyroid ultrasound is recommended in the near future to determine any potential need for  further evaluation with fine-needle aspiration. 3. Incidental findings, as above.     CURRENT THERAPY: Letrozole 2.5 mg daily; began 12/18/16  INTERVAL HISTORY: Ms. Dulski returns for f/u as scheduled. She began letrozole 2 months ago and has tolerated well so far. She has mild  transient hot flashes x2 per day usually at night that are tolerable and do not affect her sleep. Mood is stable, no bone or joint pain. Denies fatigue, decreased appetite, cough, chest pain, dyspnea, n/v/c/d, vaginal bleeding or spotting, or arm/leg swelling. She will have plastic surgery 1/9 for removal of tissue expanders and placement of bilateral implants. She had bone scan and CT lately, DEXA to be done end of the month. She takes daily calcium plus vitamin D and remains active. She is doing well, no specific complaints today.    MEDICAL HISTORY:  Past Medical History:  Diagnosis Date  . Breast cancer ([place])    lumpectomy; chemotherapy and radiation  . Family history of breast cancer   . Genetic testing 10/30/2016   Breast/GYN panel (23 genes) @ [facility] - No pathogenic mutations detected  . GERD (gastroesophageal reflux disease)    OTC medication  . Heart murmur    as a child - Echo in early 20s "ok"  . History of echocardiogram    Echo 4/17: EF 55-60%, GLS -19.6%, normal wall motion  . History of exercise stress test    ETT 4/17 - No ST changes  . HLD (hyperlipidemia)   . Hypothyroidism   . Osteoporosis   . Personal history of chemotherapy   . Personal history of radiation therapy     SURGICAL HISTORY: Past Surgical History:  Procedure Laterality Date  . BREAST LUMPECTOMY Right 2003  . BREAST RECONSTRUCTION WITH PLACEMENT OF TISSUE EXPANDER AND FLEX HD (ACELLULAR HYDRATED DERMIS) Bilateral 11/17/2016   Procedure: IMEDITATE BREAST RECONSTRUCTION WITH PLACEMENT OF TISSUE EXPANDER AND FLEX HD (ACELLULAR HYDRATED DERMIS);  Surgeon: Wallace Going, DO;  Location: [facility];  Service: Plastics;  Laterality: Bilateral;  . BREAST SURGERY  2003 /right   lumpectomy; chemotherapy and radiation  . TOTAL ABDOMINAL HYSTERECTOMY W/ BILATERAL SALPINGOOPHORECTOMY     for treatment of breast treatment  . TOTAL MASTECTOMY Bilateral 11/17/2016   Procedure: RIGHT MASTECTOMY AND LEFT  PROPHYLATIC MASTECTOMY;  Surgeon: Jovita Kussmaul, MD;  Location: [facility];  Service: General;  Laterality: Bilateral;    I have reviewed the social history and family history with the patient and they are unchanged from previous note.  ALLERGIES:  has No Known Allergies.  MEDICATIONS:  Current Outpatient Medications  Medication Sig Dispense Refill  . denosumab (PROLIA) 60 MG/ML SOLN injection Inject 60 mg into the skin every 6 (six) months. Administer in upper arm, thigh, or abdomen 1 Syringe 1  . diazepam (VALIUM) 5 MG tablet TAKE 1 TABLET BY MOUTH EVERY 8 HOURS AS NEEDED FOR ANXIETY    . letrozole (FEMARA) 2.5 MG tablet Take 1 tablet (2.5 mg total) daily by mouth. 30 tablet 3  . HYDROcodone-acetaminophen (NORCO/VICODIN) 5-325 MG tablet every 8 (eight) hours as needed.  0  . ibuprofen (ADVIL,MOTRIN) 200 MG tablet Take 600 mg by mouth every 6 (six) hours as needed for headache or moderate pain.    . meclizine (ANTIVERT) 25 MG tablet Take 25 mg by mouth 3 (three) times daily as needed for dizziness.    Marland Kitchen omeprazole (PRILOSEC) 20 MG capsule Take 1 capsule (20 mg total) by mouth 2 (two) times daily. 60 capsule 1  No current facility-administered medications for this visit.     PHYSICAL EXAMINATION: ECOG PERFORMANCE STATUS: 1 - Symptomatic but completely ambulatory  Vitals:   01/22/17 1101  BP: 126/84  Pulse: 73  Resp: 18  Temp: 98.6 F (37 C)  SpO2: 100%   Filed Weights   01/22/17 1101  Weight: 141 lb 3.2 oz (64 kg)    GENERAL:alert, no distress and comfortable SKIN: skin color, texture, turgor are normal, no rashes or significant lesions EYES: normal, conjunctiva are pink and non-injected, sclera clear OROPHARYNX:no exudate, no erythema and lips, buccal mucosa, and tongue normal  NECK: supple (+) prominent thyroid, non-tender, right side with nodularity LYMPH:  no palpable cervical, supraclavicular, axillary, or inguinal lymphadenopathy LUNGS: clear to auscultation bilaterally  with normal breathing effort HEART: regular rate & rhythm and no murmurs and no lower extremity edema ABDOMEN:abdomen soft, non-tender and normal bowel sounds Musculoskeletal:no cyanosis of digits and no clubbing  NEURO: alert & oriented x 3 with fluent speech, no focal motor/sensory deficits BREASTS: s/p bilateral mastectomy with tissue expanders; incisions are closed and well healed, no erythema or drainage  LABORATORY DATA:  I have reviewed the data as listed CBC Latest Ref Rng & Units 01/22/2017 11/18/2016 11/11/2016  WBC 3.9 - 10.3 10e3/uL 4.9 9.7 7.2  Hemoglobin 11.6 - 15.9 g/dL 13.9 12.3 13.9  Hematocrit 34.8 - 46.6 % 41.7 36.4 41.7  Platelets 145 - 400 10e3/uL 252 216 237     CMP Latest Ref Rng & Units 01/22/2017 11/18/2016 11/11/2016  Glucose 70 - 140 mg/dl 84 128(H) 123(H)  BUN 7.0 - 26.0 mg/dL 18.0 13 17  Creatinine 0.6 - 1.1 mg/dL 1.0 0.88 0.90  Sodium 136 - 145 mEq/L 141 139 136  Potassium 3.5 - 5.1 mEq/L 4.4 4.0 3.8  Chloride 101 - 111 mmol/L - 107 101  CO2 22 - 29 mEq/L '27 27 25  '$ Calcium 8.4 - 10.4 mg/dL 9.7 8.7(L) 8.8(L)  Total Protein 6.4 - 8.3 g/dL 7.5 - -  Total Bilirubin 0.20 - 1.20 mg/dL 0.47 - -  Alkaline Phos 40 - 150 U/L 58 - -  AST 5 - 34 U/L 29 - -  ALT 0 - 55 U/L 35 - -   PATHOLOGY   Diagnosis10/15/18 1. Breast, simple mastectomy, Left - BENIGN BREAST TISSUE WITH ECTATIC DUCTS. - ONE OF ONE LYMPH NODE NEGATIVE FOR CARCINOMA (0/1). 2. Breast, simple mastectomy, Right - INVASIVE DUCTAL CARCINOMA, GRADE 3, SPANNING 2.4 CM. - HIGH GRADE DUCTAL CARCINOMA IN SITU. - INVASIVE CARCINOMA IS <0.1 CM OF THE POSTERIOR MARGIN (BROADLY) AND ANTERIOR MARGIN (FOCALLY). - IN SITU CARCINOMA IS >0.2 CM OF BOTH MARGINS. - PRIOR LUMPECTOMY SITE, NEGATIVE FOR CARCINOMA. - SEE ONCOLOGY TABLE. 3. Breast, excision, Superior flap deep margin - BENIGN FIBROADIPOSE TISSUE. Microscopic Comment 2. BREAST, INVASIVE TUMOR Procedure: Right mastectomy. Laterality:  Right. Tumor Size: 2.4 cm. Histologic Type: Invasive ductal carcinoma. Grade: 3 Tubular Differentiation: 3 Nuclear Pleomorphism: 2 Mitotic Count: 3 Ductal Carcinoma in Situ (DCIS): Present, high grade. Extent of Tumor: Tumor abuts skeletal muscle but does not invade it. Margins: Invasive carcinoma, distance from closest margin: <0.1 of posterior (broadly) and anterior (Focally). DCIS, distance from closest margin: >0.2 cm all margins. Regional Lymph Nodes: None. Breast Prognostic Profile: Performed on biopsy [id number], see below. Will not be repeated. Estrogen Receptor: Positive, 80% strong staining. Progesterone Receptor: Positive, 30% strong staining. 1 of 3 FINAL for EMREY, THORNLEY [phone]) Microscopic Comment(continued) Her2: Negative (ratio 1.14). Ki-67: 35% Best tumor block  for sendout testing: 2B Pathologic Stage Classification (pTNM, AJCC 8th Edition): Primary Tumor (pT): pT2 Regional Lymph Nodes (pN): pN0 Distant Metastases (pM): pMX Comments: None.   Diagnosis 10/09/16 Breast, right, needle core biopsy, upper inner quadrant, 1:00 o'clock - INVASIVE DUCTAL CARCINOMA, SEE COMMENT. Microscopic Comment The carcinoma appears grade 3. Prognostic markers will be ordered. Dr. Lyndon Code has reviewed the case. The case was called to [facility] on 10/10/2016.   05/17/2001 Lumpectomy from prior breast cancer FINAL DIAGNOSIS MICROSCOPIC EXAMINATION AND DIAGNOSIS 1. SENTINEL LYMPH NODE BIOPSY, RIGHT AXILLARY LYMPH NODE: ONE LYMPH NODE, POSITIVE FOR METASTATIC CARCINOMA (1/1) SEE COMMENT. 2. LUMPECTOMY, RIGHT BREAST: - INVASIVE MAMMARY CARCINOMA, NO SPECIAL TYPE (DUCTAL), 2.3 CM, MSBR GRADE 3/3. - DUCTAL CARCINOMA IN SITU, INTERMEDIATE GRADE. - EXTENSIVE INTRADUCTAL COMPONENT NEGATIVE (JCRT) - LYMPHOVASCULAR INVASION IDENTIFIED. - SEE COMMENT. 3. AXILLARY DISSECTION, RIGHT AXILLA: TWO OF SIX LYMPH NODES POSITIVE FOR METASTATIC TUMOR  (2/6). SEE COMMENT. COMMENT 1. Tumor is seen within vessels adjacent to lymph node capsule. No definitive extension beyond capsule into soft tissues is identified. The largest extent of metastatic tumor is 0.9 cm. 2. Invasive tumor approaches to within 1 mm of the deep aspect of the specimen and 4 mm of both the anterior and superior aspects of the specimen. Despite these relatively close margins, it appears that the tumor has been completely excised. Fragments of skeletal muscle are seen at the deep aspect of the specimen and tumor is not seen to extend into skeletal muscle. Ductal carcinoma in situ approaches to within 3 mm of the posterior margin and 5 mm of the anterior margin. Ductal carcinoma is confined to the area of invasive tumor in the sections examined. A breast prognostic marker panel has already been issued for this tumor ([id number]). It shows that the invasive component is positive for estrogen and progesterone receptors and negative for Her 2 neu over expression is measures by the Herceptest method. For complete details please see the original report. 3. Extracapsular extension in not identified. The largest focus of metastatic tumor measures 1.2 cm. ONCOLOGY TABLE-BREAST, INCISIONAL/EXCISIONAL BIOPSY OR MASTECTOMY WITH LYMPH NODES 1. Maximum tumor size (cm): 2.3 cm 2. Margins: Invasive component, distance to closest margin: 58m, posterior In situ component, distance to closest margin: 367m posterior 3. Vascular/Lymphatic invasion: Identified 4. Histology, invasive component: No special type (ductal). 5. Grade, invasive component (Elston-Ellis modified Scarff-Bloom-Richardson): 3 of 3 Tubule formation grade: 3 of 3 Nuclear pleomorphism grade: 2 of 3 Mitotic grade: 3 of 3 6. % In situ: Less than 5 % of the measured component. 7. Histology, in situ component: Solid pattern 8. Grade, in situ component: Intermediate grade 9.  Multicentric (separate tumors in different quadrants): No 10. Multifocal (separate tumors in same quadrant or biopsy): No 11. Axillary lymph nodes: #examined: 7 #with metastasis: 3 ITC (isolated tumor cells, < 0.68m1m 0 Micrometastasis (> 0.68mm66m 68mm)60m Metastasis > 2 mm: 3 Extracapsular extension: Not identified. 12. TNM Code: pT2, pN1, pMX   ONCOTYPE 11/17/16     RADIOGRAPHIC STUDIES: I have personally reviewed the radiological images as listed and agreed with the findings in the report. No results found.   ASSESSMENT & PLAN: LauraLILLITH MCNEFF y.61 postmenopausal Caucasian female with a history of Heart murmur, HLD, and osteoporosis.  1. Malignant neoplasm of upper-inner quadrant of right breast, invasive ductal carcinoma, pT2N0, M0), Stage 1B, ER/PR: positive, HER2 negative, Grade 3, Oncotype RS 21 2. Genetic testing negative  3. Osteoporosis 4.  Hot flashes  5. Thyroid nodules   Ms. Lindahl appears stable today, tolerating letrozole well. Hot flashes are mild and tolerable overall. We reviewed her imaging in detail; CT CAP negative for metastatic disease in the chest, abdomen, or pelvis; bone scan negative for osseous metastasis. Labs reviewed, CBC and Cmet WNL. She is doing well clinically, physical exam unremarkable, no clinical concern for recurrence. She has history of osteoporosis with previous AI. DEXA due 02/02/17; she continues calcium, vitamin D, and exercise as she is able. She will continue letrozole daily and proceed with plastic surgery in 02/2017. Return to clinic 1 month after surgery then will be seen in survivorship clinic.  CT Chest identifies multiple heterogenous appearing thyroid nodules. Previous CT chest in 2004 noted bilateral thyroid nodules. Does not appear she has had dedicated thyroid US. She reports she had a goiter and was on synthroid >15 years ago but TSH level normalized with diet and exercise and she was weaned off medication.  Her thyroid is prominent with R side nodularity on exam. I will defer to her PCP Dr. Raoul Pitch for further evaluation and management. Per patient she will have her annual physical and labs including TSH in March 2019. She plans to call PCP to make an appointment.   PLAN: -DEXA 02/02/17; continue calcium, vitamin D, exercise -Continue daily letrozole (AI for 7-10 years) -Return in 2 months -Surgery per Dr. Marla Roe 02/11/17 -F/u with PCP re: thyroid; I will CC my note    All questions were answered. The patient knows to call the clinic with any problems, questions or concerns. No barriers to learning was detected.     Alla Feeling, NP 01/22/17

## 2017-01-26 ENCOUNTER — Telehealth: Payer: Self-pay | Admitting: Hematology

## 2017-01-26 NOTE — Telephone Encounter (Signed)
Spoke to patient regarding upcoming June appointments  °

## 2017-02-02 ENCOUNTER — Ambulatory Visit
Admission: RE | Admit: 2017-02-02 | Discharge: 2017-02-02 | Disposition: A | Discharge status: Home / Self Care | Payer: BLUE CROSS/BLUE SHIELD | Source: Ambulatory Visit | Attending: Nurse Practitioner | Admitting: Nurse Practitioner

## 2017-02-02 DIAGNOSIS — M85851 Other specified disorders of bone density and structure, right thigh: Secondary | ICD-10-CM | POA: Diagnosis not present

## 2017-02-02 DIAGNOSIS — Z780 Asymptomatic menopausal state: Secondary | ICD-10-CM | POA: Diagnosis not present

## 2017-02-02 DIAGNOSIS — M818 Other osteoporosis without current pathological fracture: Secondary | ICD-10-CM

## 2017-02-04 ENCOUNTER — Other Ambulatory Visit: Payer: Self-pay | Admitting: *Deleted

## 2017-02-04 MED ORDER — LETROZOLE 2.5 MG PO TABS: 2.5000 mg | ORAL_TABLET | Freq: Every day | ORAL | 3 refills | Status: DC

## 2017-02-05 ENCOUNTER — Encounter (HOSPITAL_BASED_OUTPATIENT_CLINIC_OR_DEPARTMENT_OTHER): Payer: Self-pay | Admitting: *Deleted

## 2017-02-06 ENCOUNTER — Ambulatory Visit: Payer: Self-pay | Admitting: Plastic Surgery

## 2017-02-06 DIAGNOSIS — Z9013 Acquired absence of bilateral breasts and nipples: Secondary | ICD-10-CM

## 2017-02-06 NOTE — H&P (View-Only) (Signed)
Olivia [name] is an 57 y.o. female.   Chief Complaint: acquired absence of breast HPI: The patient is a 57 yrs old wf here for bilateral breast reconstruction after mastectomy with expander / ADM placement on 10/15.  She currently has 320 / 300 cc in each expader.  She is wanting to be a B or C cup.  We had a long discussion about the need for a latissimus if she wants to be larger than she is now with the 320 cc.  She does not want to have the additional surgery and believes she will be happy with the size.  History: Mammogram = invasive ductal carcinoma of the RIGHT breast in the upper inner quadrant 1.6 cm with a negative axilla.  It is ER / PR positive, HER2 negative and Ki67 of 35%.  She is 5 feet 4 inches tall, weight is 135 pounds and preop bra= 36 B.  She underwent a hysterectomy in 2005 and right lumpectomy in 2003 with chemo and radiation.  She does not have kids, she is married and her husband has mild dementia.  Past Medical History:  Diagnosis Date  . Breast cancer ([place])    lumpectomy; chemotherapy and radiation  . Family history of breast cancer   . Genetic testing 10/30/2016   Breast/GYN panel (23 genes) @ [facility] - No pathogenic mutations detected  . GERD (gastroesophageal reflux disease)    OTC medication  . Heart murmur    as a child - Echo in early 20s "ok"  . History of echocardiogram    Echo 4/17: EF 55-60%, GLS -19.6%, normal wall motion  . History of exercise stress test    ETT 4/17 - No ST changes  . HLD (hyperlipidemia)   . Hypothyroidism   . Osteoporosis   . Personal history of chemotherapy   . Personal history of radiation therapy     Past Surgical History:  Procedure Laterality Date  . BREAST LUMPECTOMY Right 2003  . BREAST RECONSTRUCTION WITH PLACEMENT OF TISSUE EXPANDER AND FLEX HD (ACELLULAR HYDRATED DERMIS) Bilateral 11/17/2016   Procedure: IMEDITATE BREAST RECONSTRUCTION WITH PLACEMENT OF TISSUE EXPANDER AND FLEX HD (ACELLULAR HYDRATED DERMIS);   Surgeon: Wallace Going, DO;  Location: [facility];  Service: Plastics;  Laterality: Bilateral;  . BREAST SURGERY  2003 /right   lumpectomy; chemotherapy and radiation  . TOTAL ABDOMINAL HYSTERECTOMY W/ BILATERAL SALPINGOOPHORECTOMY     for treatment of breast treatment  . TOTAL MASTECTOMY Bilateral 11/17/2016   Procedure: RIGHT MASTECTOMY AND LEFT PROPHYLATIC MASTECTOMY;  Surgeon: Jovita Kussmaul, MD;  Location: [facility] OR;  Service: General;  Laterality: Bilateral;    Family History  Problem Relation Age of Onset  . Diabetes Other        1ST DEGREE RELATIVE  . Coronary artery disease Mother        1ST DEGREE RELATIVE  . Heart attack Mother 40  . Skin cancer Mother   . Diabetes Father   . Kidney disease Father   . Prostate cancer Father 61       deceased 56  . Cancer Brother 15       multiple myeloma   . Lung cancer Maternal Uncle        in 2 mat uncles  . Breast cancer Paternal Aunt 66  . Breast cancer Cousin        dx 24s; daughter of unaffected paternal aunt  . Breast cancer Cousin        dx 13s; another  daughter of unaffected paternal aunt  . Esophageal cancer Cousin        pat first cousin   Social History:  reports that  has never smoked. she has never used smokeless tobacco. She reports that she drinks alcohol. She reports that she does not use drugs.  Allergies: No Known Allergies   (Not in a hospital admission)  No results found for this or any previous visit (from the past 48 hour(s)). No results found.  Review of Systems  Constitutional: Negative.   HENT: Negative.   Eyes: Negative.   Respiratory: Negative.   Cardiovascular: Negative.   Gastrointestinal: Negative.   Genitourinary: Negative.   Musculoskeletal: Negative.   Skin: Negative.   Psychiatric/Behavioral: Negative.     There were no vitals taken for this visit. Physical Exam  Constitutional: She appears well-developed and well-nourished.  HENT:  Head: Normocephalic and atraumatic.  Eyes:  Conjunctivae and EOM are normal. Pupils are equal, round, and reactive to light.  Cardiovascular: Normal rate.  Respiratory: Effort normal.  Neurological: She is alert.  Skin: Skin is warm.     Assessment/Plan Removal of bilateral expander and placement of silicone implants  [place], DO 02/06/2017, 3:45 PM

## 2017-02-06 NOTE — H&P (Signed)
Olivia [name] is an 57 y.o. female.   Chief Complaint: acquired absence of breast HPI: The patient is a 57 yrs old wf here for bilateral breast reconstruction after mastectomy with expander / ADM placement on 10/15.  She currently has 320 / 300 cc in each expader.  She is wanting to be a B or C cup.  We had a long discussion about the need for a latissimus if she wants to be larger than she is now with the 320 cc.  She does not want to have the additional surgery and believes she will be happy with the size.  History: Mammogram = invasive ductal carcinoma of the RIGHT breast in the upper inner quadrant 1.6 cm with a negative axilla.  It is ER / PR positive, HER2 negative and Ki67 of 35%.  She is 5 feet 4 inches tall, weight is 135 pounds and preop bra= 36 B.  She underwent a hysterectomy in 2005 and right lumpectomy in 2003 with chemo and radiation.  She does not have kids, she is married and her husband has mild dementia.  Past Medical History:  Diagnosis Date  . Breast cancer ([place])    lumpectomy; chemotherapy and radiation  . Family history of breast cancer   . Genetic testing 10/30/2016   Breast/GYN panel (23 genes) @ [facility] - No pathogenic mutations detected  . GERD (gastroesophageal reflux disease)    OTC medication  . Heart murmur    as a child - Echo in early 20s "ok"  . History of echocardiogram    Echo 4/17: EF 55-60%, GLS -19.6%, normal wall motion  . History of exercise stress test    ETT 4/17 - No ST changes  . HLD (hyperlipidemia)   . Hypothyroidism   . Osteoporosis   . Personal history of chemotherapy   . Personal history of radiation therapy     Past Surgical History:  Procedure Laterality Date  . BREAST LUMPECTOMY Right 2003  . BREAST RECONSTRUCTION WITH PLACEMENT OF TISSUE EXPANDER AND FLEX HD (ACELLULAR HYDRATED DERMIS) Bilateral 11/17/2016   Procedure: IMEDITATE BREAST RECONSTRUCTION WITH PLACEMENT OF TISSUE EXPANDER AND FLEX HD (ACELLULAR HYDRATED DERMIS);   Surgeon: Wallace Going, DO;  Location: [facility];  Service: Plastics;  Laterality: Bilateral;  . BREAST SURGERY  2003 /right   lumpectomy; chemotherapy and radiation  . TOTAL ABDOMINAL HYSTERECTOMY W/ BILATERAL SALPINGOOPHORECTOMY     for treatment of breast treatment  . TOTAL MASTECTOMY Bilateral 11/17/2016   Procedure: RIGHT MASTECTOMY AND LEFT PROPHYLATIC MASTECTOMY;  Surgeon: Jovita Kussmaul, MD;  Location: [facility] OR;  Service: General;  Laterality: Bilateral;    Family History  Problem Relation Age of Onset  . Diabetes Other        1ST DEGREE RELATIVE  . Coronary artery disease Mother        1ST DEGREE RELATIVE  . Heart attack Mother 57  . Skin cancer Mother   . Diabetes Father   . Kidney disease Father   . Prostate cancer Father 36       deceased 34  . Cancer Brother 8       multiple myeloma   . Lung cancer Maternal Uncle        in 2 mat uncles  . Breast cancer Paternal Aunt 54  . Breast cancer Cousin        dx 22s; daughter of unaffected paternal aunt  . Breast cancer Cousin        dx 54s; another  daughter of unaffected paternal aunt  . Esophageal cancer Cousin        pat first cousin   Social History:  reports that  has never smoked. she has never used smokeless tobacco. She reports that she drinks alcohol. She reports that she does not use drugs.  Allergies: No Known Allergies   (Not in a hospital admission)  No results found for this or any previous visit (from the past 48 hour(s)). No results found.  Review of Systems  Constitutional: Negative.   HENT: Negative.   Eyes: Negative.   Respiratory: Negative.   Cardiovascular: Negative.   Gastrointestinal: Negative.   Genitourinary: Negative.   Musculoskeletal: Negative.   Skin: Negative.   Psychiatric/Behavioral: Negative.     There were no vitals taken for this visit. Physical Exam  Constitutional: She appears well-developed and well-nourished.  HENT:  Head: Normocephalic and atraumatic.  Eyes:  Conjunctivae and EOM are normal. Pupils are equal, round, and reactive to light.  Cardiovascular: Normal rate.  Respiratory: Effort normal.  Neurological: She is alert.  Skin: Skin is warm.     Assessment/Plan Removal of bilateral expander and placement of silicone implants  [name], DO 02/06/2017, 3:45 PM

## 2017-02-11 ENCOUNTER — Encounter (HOSPITAL_BASED_OUTPATIENT_CLINIC_OR_DEPARTMENT_OTHER)
Admission: RE | Disposition: A | Discharge status: Home / Self Care | Payer: Self-pay | Source: Ambulatory Visit | Attending: Plastic Surgery

## 2017-02-11 ENCOUNTER — Ambulatory Visit (HOSPITAL_BASED_OUTPATIENT_CLINIC_OR_DEPARTMENT_OTHER)
Admission: RE | Admit: 2017-02-11 | Discharge: 2017-02-11 | Disposition: A | Discharge status: Home / Self Care | Payer: BLUE CROSS/BLUE SHIELD | Source: Ambulatory Visit | Attending: Plastic Surgery | Admitting: Plastic Surgery

## 2017-02-11 ENCOUNTER — Encounter (HOSPITAL_BASED_OUTPATIENT_CLINIC_OR_DEPARTMENT_OTHER): Payer: Self-pay | Admitting: Plastic Surgery

## 2017-02-11 ENCOUNTER — Other Ambulatory Visit: Payer: Self-pay

## 2017-02-11 ENCOUNTER — Ambulatory Visit (HOSPITAL_BASED_OUTPATIENT_CLINIC_OR_DEPARTMENT_OTHER): Payer: BLUE CROSS/BLUE SHIELD | Admitting: Anesthesiology

## 2017-02-11 DIAGNOSIS — K219 Gastro-esophageal reflux disease without esophagitis: Secondary | ICD-10-CM | POA: Insufficient documentation

## 2017-02-11 DIAGNOSIS — Z8249 Family history of ischemic heart disease and other diseases of the circulatory system: Secondary | ICD-10-CM | POA: Insufficient documentation

## 2017-02-11 DIAGNOSIS — Z800 Family history of malignant neoplasm of digestive organs: Secondary | ICD-10-CM | POA: Insufficient documentation

## 2017-02-11 DIAGNOSIS — Z9013 Acquired absence of bilateral breasts and nipples: Secondary | ICD-10-CM | POA: Insufficient documentation

## 2017-02-11 DIAGNOSIS — E785 Hyperlipidemia, unspecified: Secondary | ICD-10-CM | POA: Diagnosis not present

## 2017-02-11 DIAGNOSIS — Z841 Family history of disorders of kidney and ureter: Secondary | ICD-10-CM | POA: Insufficient documentation

## 2017-02-11 DIAGNOSIS — M810 Age-related osteoporosis without current pathological fracture: Secondary | ICD-10-CM | POA: Insufficient documentation

## 2017-02-11 DIAGNOSIS — Z808 Family history of malignant neoplasm of other organs or systems: Secondary | ICD-10-CM | POA: Insufficient documentation

## 2017-02-11 DIAGNOSIS — Z853 Personal history of malignant neoplasm of breast: Secondary | ICD-10-CM | POA: Diagnosis not present

## 2017-02-11 DIAGNOSIS — Z90710 Acquired absence of both cervix and uterus: Secondary | ICD-10-CM | POA: Insufficient documentation

## 2017-02-11 DIAGNOSIS — E039 Hypothyroidism, unspecified: Secondary | ICD-10-CM | POA: Diagnosis not present

## 2017-02-11 DIAGNOSIS — Z803 Family history of malignant neoplasm of breast: Secondary | ICD-10-CM | POA: Diagnosis not present

## 2017-02-11 DIAGNOSIS — Z923 Personal history of irradiation: Secondary | ICD-10-CM | POA: Diagnosis not present

## 2017-02-11 DIAGNOSIS — Z833 Family history of diabetes mellitus: Secondary | ICD-10-CM | POA: Diagnosis not present

## 2017-02-11 DIAGNOSIS — Z8042 Family history of malignant neoplasm of prostate: Secondary | ICD-10-CM | POA: Insufficient documentation

## 2017-02-11 DIAGNOSIS — E042 Nontoxic multinodular goiter: Secondary | ICD-10-CM | POA: Diagnosis not present

## 2017-02-11 DIAGNOSIS — Z9221 Personal history of antineoplastic chemotherapy: Secondary | ICD-10-CM | POA: Diagnosis not present

## 2017-02-11 DIAGNOSIS — Z801 Family history of malignant neoplasm of trachea, bronchus and lung: Secondary | ICD-10-CM | POA: Diagnosis not present

## 2017-02-11 DIAGNOSIS — Z170 Estrogen receptor positive status [ER+]: Secondary | ICD-10-CM | POA: Diagnosis not present

## 2017-02-11 DIAGNOSIS — Z421 Encounter for breast reconstruction following mastectomy: Secondary | ICD-10-CM | POA: Insufficient documentation

## 2017-02-11 DIAGNOSIS — Z8379 Family history of other diseases of the digestive system: Secondary | ICD-10-CM | POA: Diagnosis not present

## 2017-02-11 HISTORY — PX: REMOVAL OF BILATERAL TISSUE EXPANDERS WITH PLACEMENT OF BILATERAL BREAST IMPLANTS: SHX6431

## 2017-02-11 SURGERY — REMOVAL, TISSUE EXPANDER, BREAST, BILATERAL, WITH BILATERAL IMPLANT IMPLANT INSERTION
Anesthesia: General | Site: Breast | Laterality: Bilateral

## 2017-02-11 MED ORDER — MIDAZOLAM HCL 2 MG/2ML IJ SOLN
1.0000 mg | INTRAMUSCULAR | Status: DC | PRN
  Administered 2017-02-11: 2 mg via INTRAVENOUS

## 2017-02-11 MED ORDER — BUPIVACAINE-EPINEPHRINE 0.25% -1:200000 IJ SOLN
INTRAMUSCULAR | Status: DC | PRN
  Administered 2017-02-11: 7 mL

## 2017-02-11 MED ORDER — SCOPOLAMINE 1 MG/3DAYS TD PT72: 1.0000 | MEDICATED_PATCH | Freq: Once | TRANSDERMAL | Status: DC | PRN

## 2017-02-11 MED ORDER — SUFENTANIL CITRATE 50 MCG/ML IV SOLN
INTRAVENOUS | Status: DC | PRN
  Administered 2017-02-11: 10 ug via INTRAVENOUS

## 2017-02-11 MED ORDER — SODIUM CHLORIDE 0.9 % IV SOLN
INTRAVENOUS | Status: DC | PRN
  Administered 2017-02-11: 500 mL

## 2017-02-11 MED ORDER — LIDOCAINE HCL (CARDIAC) 20 MG/ML IV SOLN
INTRAVENOUS | Status: DC | PRN
  Administered 2017-02-11: 50 mg via INTRAVENOUS

## 2017-02-11 MED ORDER — SUGAMMADEX SODIUM 200 MG/2ML IV SOLN
INTRAVENOUS | Status: DC | PRN
  Administered 2017-02-11: 150 mg via INTRAVENOUS

## 2017-02-11 MED ORDER — EPHEDRINE SULFATE 50 MG/ML IJ SOLN
INTRAMUSCULAR | Status: DC | PRN
  Administered 2017-02-11 (×2): 10 mg via INTRAVENOUS

## 2017-02-11 MED ORDER — SUFENTANIL CITRATE 50 MCG/ML IV SOLN
INTRAVENOUS | Status: AC
  Filled 2017-02-11: qty 1

## 2017-02-11 MED ORDER — CEFAZOLIN SODIUM-DEXTROSE 2-4 GM/100ML-% IV SOLN
INTRAVENOUS | Status: AC
Start: 2017-02-11 — End: ?
  Filled 2017-02-11: qty 100

## 2017-02-11 MED ORDER — SUCCINYLCHOLINE CHLORIDE 200 MG/10ML IV SOSY
PREFILLED_SYRINGE | INTRAVENOUS | Status: AC
  Filled 2017-02-11: qty 10

## 2017-02-11 MED ORDER — HYDROCODONE-ACETAMINOPHEN 5-325 MG PO TABS
1.0000 | ORAL_TABLET | Freq: Once | ORAL | Status: AC
  Administered 2017-02-11: 1 via ORAL

## 2017-02-11 MED ORDER — ONDANSETRON HCL 4 MG/2ML IJ SOLN
INTRAMUSCULAR | Status: AC
  Filled 2017-02-11: qty 2

## 2017-02-11 MED ORDER — ROCURONIUM BROMIDE 10 MG/ML (PF) SYRINGE
PREFILLED_SYRINGE | INTRAVENOUS | Status: AC
  Filled 2017-02-11: qty 5

## 2017-02-11 MED ORDER — EPHEDRINE 5 MG/ML INJ
INTRAVENOUS | Status: AC
  Filled 2017-02-11: qty 10

## 2017-02-11 MED ORDER — SCOPOLAMINE 1 MG/3DAYS TD PT72
1.0000 | MEDICATED_PATCH | Freq: Once | TRANSDERMAL | Status: DC
  Administered 2017-02-11: 1.5 mg via TRANSDERMAL

## 2017-02-11 MED ORDER — DEXTROSE 5 % IV SOLN
INTRAVENOUS | Status: DC | PRN
  Administered 2017-02-11: 40 ug/min via INTRAVENOUS

## 2017-02-11 MED ORDER — MIDAZOLAM HCL 2 MG/2ML IJ SOLN
INTRAMUSCULAR | Status: AC
  Filled 2017-02-11: qty 2

## 2017-02-11 MED ORDER — DEXAMETHASONE SODIUM PHOSPHATE 4 MG/ML IJ SOLN
INTRAMUSCULAR | Status: DC | PRN
  Administered 2017-02-11: 10 mg via INTRAVENOUS

## 2017-02-11 MED ORDER — CEFAZOLIN SODIUM-DEXTROSE 2-4 GM/100ML-% IV SOLN
2.0000 g | INTRAVENOUS | Status: AC
  Administered 2017-02-11: 2 g via INTRAVENOUS

## 2017-02-11 MED ORDER — PHENYLEPHRINE 40 MCG/ML (10ML) SYRINGE FOR IV PUSH (FOR BLOOD PRESSURE SUPPORT)
PREFILLED_SYRINGE | INTRAVENOUS | Status: AC
  Filled 2017-02-11: qty 10

## 2017-02-11 MED ORDER — PROMETHAZINE HCL 25 MG/ML IJ SOLN: 6.2500 mg | INTRAMUSCULAR | Status: DC | PRN

## 2017-02-11 MED ORDER — SCOPOLAMINE 1 MG/3DAYS TD PT72
MEDICATED_PATCH | TRANSDERMAL | Status: AC
  Filled 2017-02-11: qty 1

## 2017-02-11 MED ORDER — DEXAMETHASONE SODIUM PHOSPHATE 10 MG/ML IJ SOLN
INTRAMUSCULAR | Status: AC
  Filled 2017-02-11: qty 1

## 2017-02-11 MED ORDER — ROCURONIUM BROMIDE 100 MG/10ML IV SOLN
INTRAVENOUS | Status: DC | PRN
  Administered 2017-02-11: 40 mg via INTRAVENOUS

## 2017-02-11 MED ORDER — ACETAMINOPHEN 325 MG PO TABS: 650.0000 mg | ORAL_TABLET | ORAL | Status: DC | PRN

## 2017-02-11 MED ORDER — FENTANYL CITRATE (PF) 100 MCG/2ML IJ SOLN: 25.0000 ug | INTRAMUSCULAR | Status: DC | PRN

## 2017-02-11 MED ORDER — PROPOFOL 500 MG/50ML IV EMUL
INTRAVENOUS | Status: AC
  Filled 2017-02-11: qty 50

## 2017-02-11 MED ORDER — SODIUM CHLORIDE 0.9 % IV SOLN
250.0000 mL | INTRAVENOUS | Status: DC | PRN
Start: 2017-02-11 — End: 2017-02-11

## 2017-02-11 MED ORDER — SODIUM CHLORIDE 0.9% FLUSH: 3.0000 mL | Freq: Two times a day (BID) | INTRAVENOUS | Status: DC

## 2017-02-11 MED ORDER — LACTATED RINGERS IV SOLN
INTRAVENOUS | Status: DC
  Administered 2017-02-11 (×2): via INTRAVENOUS

## 2017-02-11 MED ORDER — LIDOCAINE 2% (20 MG/ML) 5 ML SYRINGE
INTRAMUSCULAR | Status: AC
  Filled 2017-02-11: qty 5

## 2017-02-11 MED ORDER — ACETAMINOPHEN 650 MG RE SUPP: 650.0000 mg | RECTAL | Status: DC | PRN

## 2017-02-11 MED ORDER — SODIUM CHLORIDE 0.9% FLUSH
3.0000 mL | INTRAVENOUS | Status: DC | PRN
Start: 2017-02-11 — End: 2017-02-11

## 2017-02-11 MED ORDER — HYDROCODONE-ACETAMINOPHEN 5-325 MG PO TABS
ORAL_TABLET | ORAL | Status: AC
  Filled 2017-02-11: qty 1

## 2017-02-11 MED ORDER — FENTANYL CITRATE (PF) 100 MCG/2ML IJ SOLN: 50.0000 ug | INTRAMUSCULAR | Status: DC | PRN

## 2017-02-11 SURGICAL SUPPLY — 66 items
ADH SKN CLS APL DERMABOND .7 (GAUZE/BANDAGES/DRESSINGS) ×2
BAG DECANTER FOR FLEXI CONT (MISCELLANEOUS) ×3 IMPLANT
BINDER BREAST LRG (GAUZE/BANDAGES/DRESSINGS) ×2 IMPLANT
BINDER BREAST MEDIUM (GAUZE/BANDAGES/DRESSINGS) IMPLANT
BINDER BREAST XLRG (GAUZE/BANDAGES/DRESSINGS) IMPLANT
BINDER BREAST XXLRG (GAUZE/BANDAGES/DRESSINGS) IMPLANT
BIOPATCH RED 1 DISK 7.0 (GAUZE/BANDAGES/DRESSINGS) IMPLANT
BIOPATCH RED 1IN DISK 7.0MM (GAUZE/BANDAGES/DRESSINGS)
BLADE HEX COATED 2.75 (ELECTRODE) ×3 IMPLANT
BLADE SURG 15 STRL LF DISP TIS (BLADE) ×2 IMPLANT
BLADE SURG 15 STRL SS (BLADE) ×6
BNDG GAUZE ELAST 4 BULKY (GAUZE/BANDAGES/DRESSINGS) ×6 IMPLANT
CANISTER SUCT 1200ML W/VALVE (MISCELLANEOUS) ×3 IMPLANT
CHLORAPREP W/TINT 26ML (MISCELLANEOUS) ×3 IMPLANT
CORD BIPOLAR FORCEPS 12FT (ELECTRODE) IMPLANT
COVER BACK TABLE 60X90IN (DRAPES) ×3 IMPLANT
COVER MAYO STAND STRL (DRAPES) ×3 IMPLANT
DECANTER SPIKE VIAL GLASS SM (MISCELLANEOUS) IMPLANT
DERMABOND ADVANCED (GAUZE/BANDAGES/DRESSINGS) ×4
DERMABOND ADVANCED .7 DNX12 (GAUZE/BANDAGES/DRESSINGS) IMPLANT
DRAIN CHANNEL 19F RND (DRAIN) IMPLANT
DRAPE LAPAROSCOPIC ABDOMINAL (DRAPES) ×3 IMPLANT
DRSG PAD ABDOMINAL 8X10 ST (GAUZE/BANDAGES/DRESSINGS) ×4 IMPLANT
ELECT BLADE 4.0 EZ CLEAN MEGAD (MISCELLANEOUS) ×3
ELECT REM PT RETURN 9FT ADLT (ELECTROSURGICAL) ×3
ELECTRODE BLDE 4.0 EZ CLN MEGD (MISCELLANEOUS) ×1 IMPLANT
ELECTRODE REM PT RTRN 9FT ADLT (ELECTROSURGICAL) ×1 IMPLANT
EVACUATOR SILICONE 100CC (DRAIN) IMPLANT
GAUZE SPONGE 4X4 12PLY STRL LF (GAUZE/BANDAGES/DRESSINGS) IMPLANT
GLOVE BIO SURGEON STRL SZ 6.5 (GLOVE) ×4 IMPLANT
GLOVE BIO SURGEONS STRL SZ 6.5 (GLOVE) ×2
GOWN STRL REUS W/ TWL LRG LVL3 (GOWN DISPOSABLE) ×2 IMPLANT
GOWN STRL REUS W/TWL LRG LVL3 (GOWN DISPOSABLE) ×6
IMPL GEL HI PROFILE 350CC (Breast) IMPLANT
IMPLANT GEL HI PROFILE 350CC (Breast) ×6 IMPLANT
IV NS 1000ML (IV SOLUTION)
IV NS 1000ML BAXH (IV SOLUTION) IMPLANT
IV NS 500ML (IV SOLUTION)
IV NS 500ML BAXH (IV SOLUTION) IMPLANT
KIT FILL SYSTEM UNIVERSAL (SET/KITS/TRAYS/PACK) IMPLANT
NDL HYPO 25X1 1.5 SAFETY (NEEDLE) IMPLANT
NDL SAFETY ECLIPSE 18X1.5 (NEEDLE) ×1 IMPLANT
NEEDLE HYPO 18GX1.5 SHARP (NEEDLE) ×3
NEEDLE HYPO 25X1 1.5 SAFETY (NEEDLE) ×3 IMPLANT
PACK BASIN DAY SURGERY FS (CUSTOM PROCEDURE TRAY) ×3 IMPLANT
PENCIL BUTTON HOLSTER BLD 10FT (ELECTRODE) ×3 IMPLANT
PIN SAFETY STERILE (MISCELLANEOUS) IMPLANT
SIZER BREAST REUSE STRL 350CC (SIZER) ×3
SIZER BRST REUSE STRL 350CC (SIZER) IMPLANT
SLEEVE SCD COMPRESS KNEE MED (MISCELLANEOUS) ×3 IMPLANT
SPONGE LAP 18X18 X RAY DECT (DISPOSABLE) ×6 IMPLANT
SUT MNCRL AB 4-0 PS2 18 (SUTURE) ×5 IMPLANT
SUT MON AB 3-0 SH 27 (SUTURE) ×6
SUT MON AB 3-0 SH27 (SUTURE) ×1 IMPLANT
SUT MON AB 5-0 PS2 18 (SUTURE) ×5 IMPLANT
SUT PDS AB 2-0 CT2 27 (SUTURE) IMPLANT
SUT VIC AB 3-0 SH 27 (SUTURE) ×6
SUT VIC AB 3-0 SH 27X BRD (SUTURE) IMPLANT
SUT VICRYL 4-0 PS2 18IN ABS (SUTURE) IMPLANT
SYR BULB IRRIGATION 50ML (SYRINGE) ×3 IMPLANT
SYR CONTROL 10ML LL (SYRINGE) IMPLANT
TOWEL OR 17X24 6PK STRL BLUE (TOWEL DISPOSABLE) ×6 IMPLANT
TUBE CONNECTING 20'X1/4 (TUBING) ×1
TUBE CONNECTING 20X1/4 (TUBING) ×2 IMPLANT
UNDERPAD 30X30 (UNDERPADS AND DIAPERS) ×6 IMPLANT
YANKAUER SUCT BULB TIP NO VENT (SUCTIONS) ×3 IMPLANT

## 2017-02-11 NOTE — Op Note (Signed)
Op report Bilateral Exchange   DATE OF OPERATION: 02/11/2017  LOCATION: [facility]  SURGICAL DIVISION: Plastic Surgery  PREOPERATIVE DIAGNOSES:  1.History of breast cancer.  2. Acquired absence of bilateral breast.   POSTOPERATIVE DIAGNOSES:  1. History of breast cancer.  2. Acquired absence of bilateral breast.   PROCEDURE:  1. Bilateral exchange of tissue expanders for implants.  2. Bilateral capsulotomies for implant respositioning.  SURGEON: Anginette Espejo Sanger Nanci Lakatos, DO  ANESTHESIA:  General.   COMPLICATIONS: None.   IMPLANTS: Left - Mentor Smooth Round High Profile Gel 350cc. Ref #[id number].  Serial Number [id number] Right - Mentor Smooth Round High Profile Gel 350cc. Ref #[id number].  Serial Number [id number]  INDICATIONS FOR PROCEDURE:  The patient, Olivia [name], is a 57 y.o. female born on Jul 17, 1960, is here for treatment after bilateral mastectomies.  She had tissue expanders placed at the time of mastectomies. She now presents for exchange of her expanders for implants.  She requires capsulotomies to better position the implants. MRN: [id number]  CONSENT:  Informed consent was obtained directly from the patient. Risks, benefits and alternatives were fully discussed. Specific risks including but not limited to bleeding, infection, hematoma, seroma, scarring, pain, implant infection, implant extrusion, capsular contracture, asymmetry, wound healing problems, and need for further surgery were all discussed. The patient did have an ample opportunity to have her questions answered to her satisfaction.   DESCRIPTION OF PROCEDURE:  The patient was taken to the operating room. SCDs were placed and IV antibiotics were given. The patient's chest was prepped and draped in a sterile fashion. A time out was performed and the implants to be used were identified.    On the right breast: This is the radiated side.  One percent Lidocaine with epinephrine was  used to infiltrate at the incision site. The old mastectomy scar was excised.  The mastectomy flaps from the superior and inferior flaps were raised over the pectoralis major muscle for several centimeters to minimize tension for the closure. The pectoralis was split inferior to the skin incision to expose and remove the tissue expander.  Inspection of the pocket showed a normal healthy capsule and good integration of the biologic matrix.  The pocket was irrigated with antibiotic solution.  Circumferential capsulotomies were performed to allow for breast pocket expansion.  Measurements were made and a sizer used to confirm adequate pocket size for the implant dimensions.  Hemostasis was ensured with electrocautery. New gloves were placed. The implant was soaked in antibiotic solution and then placed in the pocket and oriented appropriately. The pectoralis major muscle and capsule on the anterior surface were re-closed with a 3-0 Monocryl suture. The remaining skin was closed with 4-0 Monocryl deep dermal and 5-0 Monocryl subcuticular stitches.   On the left breast: The old mastectomy scar was excised.  The mastectomy flaps from the superior and inferior flaps were raised over the pectoralis major muscle for several centimeters to minimize tension for the closure. The pectoralis was split inferior to the skin incision to expose and remove the tissue expander.  Inspection of the pocket showed a normal healthy capsule and good integration of the biologic matrix.   Circumferential capsulotomies were performed to allow for breast pocket expansion.  Measurements were made and a sizer utilized to confirm adequate pocket size for the implant dimensions.  Hemostasis was ensured with the electrocautery.  New gloves were applied. The implant was soaked in antibiotic solution and placed in the pocket and oriented appropriately. The  pectoralis major muscle and capsule on the anterior surface were re-closed with a 3-0  Monocryl suture. There was a great deal of contracture on the lateral side that was released.  Therefore a drain was placed and secured with the 4-0 Silk.  The remaining skin was closed with 4-0 Monocryl deep dermal and 5-0 Monocryl subcuticular stitches.  Dermabond was applied to the incision site. A breast binder and ABDs were placed.  The patient was allowed to wake from anesthesia and taken to the recovery room in satisfactory condition.

## 2017-02-11 NOTE — Transfer of Care (Signed)
Immediate Anesthesia Transfer of Care Note  Patient: Olivia [name]  Procedure(s) Performed: REMOVAL OF BILATERAL TISSUE EXPANDERS WITH PLACEMENT OF BILATERAL SILICONE BREAST IMPLANTS (Bilateral Breast)  Patient Location: PACU  Anesthesia Type:General  Level of Consciousness: awake and drowsy  Airway & Oxygen Therapy: Patient Spontanous Breathing and Patient connected to face mask oxygen  Post-op Assessment: Report given to RN and Post -op Vital signs reviewed and stable  Post vital signs: Reviewed and stable  Last Vitals:  Vitals:   02/11/17 0826 02/11/17 1135  BP: 127/76 (!) 88/45  Pulse: 70 82  Resp: 20 (P) 14  Temp: 36.7 C (!) (P) 36.4 C  SpO2: 100% 100%    Last Pain:  Vitals:   02/11/17 0826  TempSrc: Oral         Complications: No apparent anesthesia complications

## 2017-02-11 NOTE — Interval H&P Note (Signed)
History and Physical Interval Note:  02/11/2017 8:29 AM  Olivia [name]  has presented today for surgery, with the diagnosis of acquired absence of bilateral breast and nipple maliginant neoplaesm of upper interquadrant of right breast  The various methods of treatment have been discussed with the patient and family. After consideration of risks, benefits and other options for treatment, the patient has consented to  Procedure(s): REMOVAL OF BILATERAL TISSUE EXPANDERS WITH PLACEMENT OF BILATERAL SILICONE BREAST IMPLANTS (Bilateral) as a surgical intervention .  The patient's history has been reviewed, patient examined, no change in status, stable for surgery.  I have reviewed the patient's chart and labs.  Questions were answered to the patient's satisfaction.     Loel Lofty Gwenevere Goga

## 2017-02-11 NOTE — Anesthesia Procedure Notes (Signed)
Procedure Name: Intubation Date/Time: 02/11/2017 9:31 AM Performed by: Willa Frater, CRNA Pre-anesthesia Checklist: Patient identified, Emergency Drugs available, Suction available and Patient being monitored Patient Re-evaluated:Patient Re-evaluated prior to induction Oxygen Delivery Method: Circle system utilized Preoxygenation: Pre-oxygenation with 100% oxygen Induction Type: IV induction Ventilation: Mask ventilation without difficulty Laryngoscope Size: Mac and 3 Grade View: Grade I Tube type: Oral Tube size: 7.0 mm Number of attempts: 1 Airway Equipment and Method: Stylet and Oral airway Placement Confirmation: ETT inserted through vocal cords under direct vision,  positive ETCO2 and breath sounds checked- equal and bilateral Secured at: 21 cm Tube secured with: Tape Dental Injury: Teeth and Oropharynx as per pre-operative assessment

## 2017-02-11 NOTE — Anesthesia Preprocedure Evaluation (Addendum)
Anesthesia Evaluation  Patient identified by MRN, date of birth, ID band Patient awake    Reviewed: Allergy & Precautions, NPO status , Patient's Chart, lab work & pertinent test results  Airway Mallampati: I  TM Distance: >3 FB Neck ROM: Full    Dental  (+) Teeth Intact, Dental Advisory Given   Pulmonary neg pulmonary ROS,    Pulmonary exam normal breath sounds clear to auscultation       Cardiovascular Exercise Tolerance: Good negative cardio ROS Normal cardiovascular exam Rhythm:Regular Rate:Normal     Neuro/Psych negative neurological ROS  negative psych ROS   GI/Hepatic Neg liver ROS, GERD  ,  Endo/Other  Hypothyroidism   Renal/GU negative Renal ROS     Musculoskeletal negative musculoskeletal ROS (+)   Abdominal   Peds  Hematology negative hematology ROS (+)   Anesthesia Other Findings Day of surgery medications reviewed with the patient.  Reproductive/Obstetrics                             Anesthesia Physical Anesthesia Plan  ASA: II  Anesthesia Plan: General   Post-op Pain Management:    Induction: Intravenous  PONV Risk Score and Plan: 3 and Ondansetron, Dexamethasone and Midazolam  Airway Management Planned: Oral ETT  Additional Equipment:   Intra-op Plan:   Post-operative Plan: Extubation in OR  Informed Consent: I have reviewed the patients History and Physical, chart, labs and discussed the procedure including the risks, benefits and alternatives for the proposed anesthesia with the patient or authorized representative who has indicated his/her understanding and acceptance.   Dental advisory given  Plan Discussed with: CRNA  Anesthesia Plan Comments: (Risks/benefits of general anesthesia discussed with patient including risk of damage to teeth, lips, gum, and tongue, nausea/vomiting, allergic reactions to medications, and the possibility of heart attack,  stroke and death.  All patient questions answered.  Patient wishes to proceed.)       Anesthesia Quick Evaluation

## 2017-02-11 NOTE — Discharge Instructions (Signed)
No heavy lifting. May shower in 4 days. Continue binder or sports bra.    Call your surgeon if you experience:   1.  Fever over 101.0. 2.  Inability to urinate. 3.  Nausea and/or vomiting. 4.  Extreme swelling or bruising at the surgical site. 5.  Continued bleeding from the incision. 6.  Increased pain, redness or drainage from the incision. 7.  Problems related to your pain medication. 8.  Any problems and/or concerns    Post Anesthesia Home Care Instructions  Activity: Get plenty of rest for the remainder of the day. A responsible individual must stay with you for 24 hours following the procedure.  For the next 24 hours, DO NOT: -Drive a car -Paediatric nurse -Drink alcoholic beverages -Take any medication unless instructed by your physician -Make any legal decisions or sign important papers.  Meals: Start with liquid foods such as gelatin or soup. Progress to regular foods as tolerated. Avoid greasy, spicy, heavy foods. If nausea and/or vomiting occur, drink only clear liquids until the nausea and/or vomiting subsides. Call your physician if vomiting continues.  Special Instructions/Symptoms: Your throat may feel dry or sore from the anesthesia or the breathing tube placed in your throat during surgery. If this causes discomfort, gargle with warm salt water. The discomfort should disappear within 24 hours.  If you had a scopolamine patch placed behind your ear for the management of post- operative nausea and/or vomiting:  1. The medication in the patch is effective for 72 hours, after which it should be removed.  Wrap patch in a tissue and discard in the trash. Wash hands thoroughly with soap and water. 2. You may remove the patch earlier than 72 hours if you experience unpleasant side effects which may include dry mouth, dizziness or visual disturbances. 3. Avoid touching the patch. Wash your hands with soap and water after contact with the patch.     About my  Jackson-Pratt Bulb Drain  What is a Jackson-Pratt bulb? A Jackson-Pratt is a soft, round device used to collect drainage. It is connected to a long, thin drainage catheter, which is held in place by one or two small stiches near your surgical incision site. When the bulb is squeezed, it forms a vacuum, forcing the drainage to empty into the bulb.  Emptying the Jackson-Pratt bulb- To empty the bulb: 1. Release the plug on the top of the bulb. 2. Pour the bulb's contents into a measuring container which your nurse will provide. 3. Record the time emptied and amount of drainage. Empty the drain(s) as often as your     doctor or nurse recommends.  Date                  Time                    Amount (Drain 1)                 Amount (Drain 2)  _____________________________________________________________________  _____________________________________________________________________  _____________________________________________________________________  _____________________________________________________________________  _____________________________________________________________________  _____________________________________________________________________  _____________________________________________________________________  _____________________________________________________________________  Squeezing the Jackson-Pratt Bulb- To squeeze the bulb: 1. Make sure the plug at the top of the bulb is open. 2. Squeeze the bulb tightly in your fist. You will hear air squeezing from the bulb. 3. Replace the plug while the bulb is squeezed. 4. Use a safety pin to attach the bulb to your clothing. This will keep the catheter from     pulling at the  bulb insertion site.  When to call your doctor- Call your doctor if:  Drain site becomes red, swollen or hot.  You have a fever greater than 101 degrees F.  There is oozing at the drain site.  Drain falls out (apply a guaze bandage over  the drain hole and secure it with tape).  Drainage increases daily not related to activity patterns. (You will usually have more drainage when you are active than when you are resting.)  Drainage has a bad odor.

## 2017-02-11 NOTE — Anesthesia Postprocedure Evaluation (Signed)
Anesthesia Post Note  Patient: LARIYA KINZIE  Procedure(s) Performed: REMOVAL OF BILATERAL TISSUE EXPANDERS WITH PLACEMENT OF BILATERAL SILICONE BREAST IMPLANTS (Bilateral Breast)     Patient location during evaluation: PACU Anesthesia Type: General Level of consciousness: awake and alert Pain management: pain level controlled Vital Signs Assessment: post-procedure vital signs reviewed and stable Respiratory status: spontaneous breathing, nonlabored ventilation and respiratory function stable Cardiovascular status: blood pressure returned to baseline and stable Postop Assessment: no apparent nausea or vomiting Anesthetic complications: no    Last Vitals:  Vitals:   02/11/17 1215 02/11/17 1321  BP: 108/72 117/71  Pulse: 77 65  Resp: 18 18  Temp:  36.4 C  SpO2: 96% 99%    Last Pain:  Vitals:   02/11/17 1321  TempSrc: Oral  PainSc: 5                  Catalina Gravel

## 2017-02-12 ENCOUNTER — Encounter (HOSPITAL_BASED_OUTPATIENT_CLINIC_OR_DEPARTMENT_OTHER): Payer: Self-pay | Admitting: Plastic Surgery

## 2017-03-19 DIAGNOSIS — C50911 Malignant neoplasm of unspecified site of right female breast: Secondary | ICD-10-CM | POA: Diagnosis not present

## 2017-03-24 NOTE — Progress Notes (Signed)
Olivia [name]  [phone] [phone]  Clinic Follow Up Note   Patient Care Team: Olivia Hillock, DO as PCP - General (Family Medicine) Olivia Merle, MD as Consulting Physician (Hematology) Olivia Kussmaul, MD as Consulting Physician (General Surgery) Olivia Pray, MD as Consulting Physician (Radiation Oncology)   Date of Service:  03/26/2017  CHIEF COMPLAINTS/PURPOSE OF CONSULTATION:  Follow up for Malignant neoplasm of upper-inner quadrant of right breast  Oncology History   Cancer Staging Malignant neoplasm of upper-inner quadrant of right breast in female, estrogen receptor positive ([place]) Staging form: Breast, AJCC 8th Edition - Clinical stage from 10/09/2016: Stage IA (cT1c, cN0, cM0, G3, ER: Positive, PR: Positive, HER2: Negative) - Signed by Olivia Merle, MD on 10/14/2016 - Pathologic stage from 11/17/2016: Stage IB (pT2, pN0, cM0, G3, ER: Positive, PR: Positive, HER2: Negative, Oncotype DX score: 21) - Signed by Olivia Merle, MD on 12/11/2016       Malignant neoplasm of upper-inner quadrant of right breast in female, estrogen receptor positive ([place])   10/08/2016 Mammogram    Diagnostic mammogram and ultrasound showed an irregular mass in the right breast at 1 o'clock, 5 cm from the nipple measuring 1.6 x 1.4x 1.6 cm, axilla was negative for adenopathy.      10/09/2016 Receptors her2    Estrogen Receptor: 80%, POSITIVE, STRONG STAINING INTENSITY Progesterone Receptor: 30%, POSITIVE, STRONG STAINING INTENSITY Proliferation Marker Ki67: 35% HER2 NEGATIVE       10/09/2016 Initial Diagnosis    Malignant neoplasm of upper-inner quadrant of right breast in female, estrogen receptor positive ([place])      10/09/2016 Initial Biopsy    Diagnosis Breast, right, needle core biopsy, upper inner quadrant, 1:00 o'clock - INVASIVE DUCTAL CARCINOMA, G3      10/2016 Genetic Testing    Genetic testing was negative for The genes analyzed were the 23 genes on [facility]'s  Breast/GYN panel (ATM, BARD1, BRCA1, BRCA2, BRIP1, CDH1, CHEK2, DICER1, EPCAM, MLH1,  MSH2, MSH6, NBN, NF1, PALB2, PMS2, PTEN, RAD50, RAD51C, RAD51D,SMARCA4, STK11, and TP53).        11/17/2016 Surgery    RIGHT MASTECTOMY AND LEFT PROPHYLATIC MASTECTOMY by Dr. Marlou [name]  IMEDITATE BREAST RECONSTRUCTION WITH PLACEMENT OF TISSUE EXPANDER AND FLEX HD (ACELLULAR HYDRATED DERMIS) by Dr. Marla [name]        11/17/2016 Pathology Results    Diagnosis 11/17/16 1. Breast, simple mastectomy, Left - BENIGN BREAST TISSUE WITH ECTATIC DUCTS. - ONE OF ONE LYMPH NODE NEGATIVE FOR CARCINOMA (0/1). 2. Breast, simple mastectomy, Right - INVASIVE DUCTAL CARCINOMA, GRADE 3, SPANNING 2.4 CM. - HIGH GRADE DUCTAL CARCINOMA IN SITU. - INVASIVE CARCINOMA IS <0.1 CM OF THE POSTERIOR MARGIN (BROADLY) AND ANTERIOR MARGIN (FOCALLY). - IN SITU CARCINOMA IS >0.2 CM OF BOTH MARGINS. - PRIOR LUMPECTOMY SITE, NEGATIVE FOR CARCINOMA. - SEE ONCOLOGY TABLE. 3. Breast, excision, Superior flap deep margin - BENIGN FIBROADIPOSE TISSUE.      11/17/2016 Miscellaneous    Oncotype on 11/17/16 Rcurence Risk Score of 21 10-Year risk of distant reucrrence with Tamoxifen alone is 13%       12/18/2016 -  Anti-estrogen oral therapy    Letrozole 2.5 mg daily; began 12/18/16      12/24/2016 Imaging    BONE SCAN IMPRESSION: No evidence of osseous metastatic disease.      12/24/2016 Imaging    CT CAP IMPRESSION: 1. Status post bilateral modified radical mastectomy and breast reconstruction, as above. No findings to suggest metastatic disease in the chest, abdomen or  pelvis. 2. Multiple heterogeneous appearing thyroid nodules, as discussed above. These are nonspecific but further evaluation with nonemergent thyroid ultrasound is recommended in the near future to determine any potential need for further evaluation with fine-needle aspiration. 3. Incidental findings, as above.      02/11/2017 Surgery    REMOVAL OF BILATERAL  TISSUE EXPANDERS WITH PLACEMENT OF BILATERAL SILICONE BREAST IMPLANTS by Dr. Marla [name]        HISTORY OF PRESENTING ILLNESS: 10/15/16 Olivia [name] 57 y.o. female is here because of newly diagnosed Malignant neoplasm of upper-inner quadrant of right breast. She presents to the Breast Clinic with her mother and her sister.  This was found in her yearly exam. She did not notice any change in appetite, weight or skin or nipple appearance.   In the past she was diagnosed with Right Breast Cancer in 2003. She was treated by Dr. Truddie [name]. She had a lumpectomy (3/7 positive lymph nodes), chemo, radiation, tamoxifen for 2 years and Arimidex for 3 years.   She also had SBO in 2005. On Tamoxifen she has joint aches that got better with Arimidex.  She does have osteoporosis that she takes prolia twice a year and has been on for 2 years. She was previously on Fosamax. She has not had a fracture. She has goiters but does not take medication. Her father had prostate cancer and her brother has MM.   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    GYN HISTORY  Menarchal: 17 LMP: 2004 Contraceptive: No HRT: No GP: G0P0   CURRENT THERAPY:  1. Adjuvant Letrozole 2.5 mg daily; began 12/18/16 2. Prolia injections every 6 months since 2016 through her PCP    INTERVAL HISTORY:  Olivia [name] is here for a follow up for her right breast cancer.  Of note, since she was last seen by our clinic she had breast reconstruction surgery by Dr. Marla [name] on 02/11/17. She presents to the clinic today noting she is still taking Letrozole and tolerating well.   On review of symptoms, pt denies joint stiffness, aches or fatigue. She has tolerable temporary hot flashes from Letrozole.     MEDICAL HISTORY:  Past Medical History:  Diagnosis Date  . Breast cancer ([place])    lumpectomy; chemotherapy and radiation  . Family history of breast  cancer   . Genetic testing 10/30/2016   Breast/GYN panel (23 genes) @ [facility] - No pathogenic mutations detected  . GERD (gastroesophageal reflux disease)    OTC medication  . Heart murmur    as a child - Echo in early 20s "ok"  . History of echocardiogram    Echo 4/17: EF 55-60%, GLS -19.6%, normal wall motion  . History of exercise stress test    ETT 4/17 - No ST changes  . HLD (hyperlipidemia)   . Hypothyroidism   . Osteoporosis   . Personal history of chemotherapy   . Personal history of radiation therapy     SURGICAL HISTORY: Past Surgical History:  Procedure Laterality Date  . BREAST LUMPECTOMY Right 2003  . BREAST RECONSTRUCTION WITH PLACEMENT OF TISSUE EXPANDER AND FLEX HD (ACELLULAR HYDRATED DERMIS) Bilateral 11/17/2016   Procedure: IMEDITATE BREAST RECONSTRUCTION WITH PLACEMENT OF TISSUE EXPANDER AND FLEX HD (ACELLULAR HYDRATED DERMIS);  Surgeon: Wallace Going, DO;  Location: [facility];  Service: Plastics;  Laterality: Bilateral;  . BREAST SURGERY  2003 /right   lumpectomy; chemotherapy and  radiation  . REMOVAL OF BILATERAL TISSUE EXPANDERS WITH PLACEMENT OF BILATERAL BREAST IMPLANTS Bilateral 02/11/2017   Procedure: REMOVAL OF BILATERAL TISSUE EXPANDERS WITH PLACEMENT OF BILATERAL SILICONE BREAST IMPLANTS;  Surgeon: Wallace Going, DO;  Location: [facility];  Service: Plastics;  Laterality: Bilateral;  . TOTAL ABDOMINAL HYSTERECTOMY W/ BILATERAL SALPINGOOPHORECTOMY     for treatment of breast treatment  . TOTAL MASTECTOMY Bilateral 11/17/2016   Procedure: RIGHT MASTECTOMY AND LEFT PROPHYLATIC MASTECTOMY;  Surgeon: Olivia Kussmaul, MD;  Location: [facility];  Service: General;  Laterality: Bilateral;    SOCIAL HISTORY: Social History   Socioeconomic History  . Marital status: Married    Spouse name: Not on file  . Number of children: Not on file  . Years of education: Not on file  . Highest education level: Not on file  Social Needs  . Financial  resource strain: Not on file  . Food insecurity - worry: Not on file  . Food insecurity - inability: Not on file  . Transportation needs - medical: Not on file  . Transportation needs - non-medical: Not on file  Occupational History  . Not on file  Tobacco Use  . Smoking status: Never Smoker  . Smokeless tobacco: Never Used  Substance and Sexual Activity  . Alcohol use: Yes    Alcohol/week: 0.0 oz    Comment: Rare  . Drug use: No  . Sexual activity: Yes    Birth control/protection: Surgical  Other Topics Concern  . Not on file  Social History Narrative   Married. Bobby.   Children: none. No pets.    Stay at home.    Wears her seatbelt. Smoke alarm in the home.    Feels safe in her relationships.    Exercise 5x week.     FAMILY HISTORY: Family History  Problem Relation Age of Onset  . Diabetes Other        1ST DEGREE RELATIVE  . Coronary artery disease Mother        1ST DEGREE RELATIVE  . Heart attack Mother 53  . Skin cancer Mother   . Diabetes Father   . Kidney disease Father   . Prostate cancer Father 11       deceased 50  . Cancer Brother 67       multiple myeloma   . Lung cancer Maternal Uncle        in 2 mat uncles  . Breast cancer Paternal Aunt 58  . Breast cancer Cousin        dx 67s; daughter of unaffected paternal aunt  . Breast cancer Cousin        dx 65s; another daughter of unaffected paternal aunt  . Esophageal cancer Cousin        pat first cousin    ALLERGIES:  has No Known Allergies.  MEDICATIONS:  Current Outpatient Medications  Medication Sig Dispense Refill  . Calcium 500 MG CHEW Chew by mouth.    . denosumab (PROLIA) 60 MG/ML SOLN injection Inject 60 mg into the skin every 6 (six) months. Administer in upper arm, thigh, or abdomen 1 Syringe 1  . diazepam (VALIUM) 5 MG tablet TAKE 1 TABLET BY MOUTH EVERY 8 HOURS AS NEEDED FOR ANXIETY    . HYDROcodone-acetaminophen (NORCO/VICODIN) 5-325 MG tablet every 8 (eight) hours as needed.  0  .  ibuprofen (ADVIL,MOTRIN) 200 MG tablet Take 600 mg by mouth every 6 (six) hours as needed for headache or moderate pain.    Marland Kitchen  letrozole (FEMARA) 2.5 MG tablet Take 1 tablet (2.5 mg total) by mouth daily. 90 tablet 3   No current facility-administered medications for this visit.     REVIEW OF SYSTEMS:   Constitutional: Denies fevers, chills or abnormal night sweats (+) temporary hot flashes, tolerable.  Eyes: Denies blurriness of vision, double vision or watery eyes Ears, nose, mouth, throat, and face: Denies mucositis or sore throat Respiratory: Denies cough, dyspnea or wheezes Cardiovascular: Denies palpitation, chest discomfort or lower extremity swelling Gastrointestinal:  Denies nausea, heartburn or change in bowel habits Skin: Denies abnormal skin rashes Lymphatics: Denies new lymphadenopathy or easy bruising Neurological:Denies numbness, tingling or new weaknesses Behavioral/Psych: Mood is stable, no new changes  All other systems were reviewed with the patient and are negative.   PHYSICAL EXAMINATION: ECOG PERFORMANCE STATUS: 0 - Asymptomatic  Vitals:   03/26/17 1129  BP: 136/82  Pulse: 66  Resp: 18  Temp: 98.3 F (36.8 C)  SpO2: 99%   Filed Weights   03/26/17 1129  Weight: 149 lb 4.8 oz (67.7 kg)    GENERAL:alert, no distress and comfortable SKIN: skin color, texture, turgor are normal, no rashes or significant lesions EYES: normal, conjunctiva are pink and non-injected, sclera clear OROPHARYNX:no exudate, no erythema and lips, buccal mucosa, and tongue normal  NECK: supple, thyroid normal size, non-tender, without nodularity LYMPH:  no palpable lymphadenopathy in the cervical, axillary or inguinal LUNGS: clear to auscultation and percussion with normal breathing effort HEART: regular rate & rhythm and no murmurs and no lower extremity edema ABDOMEN:abdomen soft, non-tender and normal bowel sounds Musculoskeletal:no cyanosis of digits and no clubbing  PSYCH:  alert & oriented x 3 with fluent speech NEURO: no focal motor/sensory deficits Breast: (+) s/p b/l breast mastectomy and reconstruction: surgical scars healed well, mild breast tenderness. No significant scar tissue. No palp mass or adenopathy. Overall benign exam.    LABORATORY DATA:  I have reviewed the data as listed CBC Latest Ref Rng & Units 03/26/2017 01/22/2017 11/18/2016  WBC 3.9 - 10.3 K/uL 4.2 4.9 9.7  Hemoglobin 11.6 - 15.9 g/dL 13.8 13.9 12.3  Hematocrit 34.8 - 46.6 % 41.1 41.7 36.4  Platelets 145 - 400 K/uL 218 252 216    CMP Latest Ref Rng & Units 03/26/2017 01/22/2017 11/18/2016  Glucose 70 - 140 mg/dL 89 84 128(H)  BUN 7 - 26 mg/dL 16 18.0 13  Creatinine 0.60 - 1.10 mg/dL 0.90 1.0 0.88  Sodium 136 - 145 mmol/L 142 141 139  Potassium 3.5 - 5.1 mmol/L 4.4 4.4 4.0  Chloride 98 - 109 mmol/L 106 - 107  CO2 22 - 29 mmol/L _0 Calcium 8.4 - 10.4 mg/dL 9.7 9.7 8.7(L)  Total Protein 6.4 - 8.3 g/dL 7.1 7.5 -  Total Bilirubin 0.2 - 1.2 mg/dL 0.4 0.47 -  Alkaline Phos 40 - 150 U/L 54 58 -  AST 5 - 34 U/L 24 29 -  ALT 0 - 55 U/L 29 35 -   PATHOLOGY   Diagnosis10/15/18 1. Breast, simple mastectomy, Left - BENIGN BREAST TISSUE WITH ECTATIC DUCTS. - ONE OF ONE LYMPH NODE NEGATIVE FOR CARCINOMA (0/1). 2. Breast, simple mastectomy, Right - INVASIVE DUCTAL CARCINOMA, GRADE 3, SPANNING 2.4 CM. - HIGH GRADE DUCTAL CARCINOMA IN SITU. - INVASIVE CARCINOMA IS <0.1 CM OF THE POSTERIOR MARGIN (BROADLY) AND ANTERIOR MARGIN (FOCALLY). - IN SITU CARCINOMA IS >0.2 CM OF BOTH MARGINS. - PRIOR LUMPECTOMY SITE, NEGATIVE FOR CARCINOMA. - SEE ONCOLOGY TABLE. 3. Breast, excision,  Superior flap deep margin - BENIGN FIBROADIPOSE TISSUE. Microscopic Comment 2. BREAST, INVASIVE TUMOR Procedure: Right mastectomy. Laterality: Right. Tumor Size: 2.4 cm. Histologic Type: Invasive ductal carcinoma. Grade: 3 Tubular Differentiation: 3 Nuclear Pleomorphism: 2 Mitotic Count: 3 Ductal  Carcinoma in Situ (DCIS): Present, high grade. Extent of Tumor: Tumor abuts skeletal muscle but does not invade it. Margins: Invasive carcinoma, distance from closest margin: <0.1 of posterior (broadly) and anterior (Focally). DCIS, distance from closest margin: >0.2 cm all margins. Regional Lymph Nodes: None. Breast Prognostic Profile: Performed on biopsy [id number], see below. Will not be repeated. Estrogen Receptor: Positive, 80% strong staining. Progesterone Receptor: Positive, 30% strong staining. 1 of 3 FINAL for AICHA, CLINGENPEEL [phone]) Microscopic Comment(continued) Her2: Negative (ratio 1.14). Ki-67: 35% Best tumor block for sendout testing: 2B Pathologic Stage Classification (pTNM, AJCC 8th Edition): Primary Tumor (pT): pT2 Regional Lymph Nodes (pN): pN0 Distant Metastases (pM): pMX Comments: None.   Diagnosis 10/09/16 Breast, right, needle core biopsy, upper inner quadrant, 1:00 o'clock - INVASIVE DUCTAL CARCINOMA, SEE COMMENT. Microscopic Comment The carcinoma appears grade 3. Prognostic markers will be ordered. Dr. Lyndon Code has reviewed the case. The case was called to [facility] on 10/10/2016.   05/17/2001 Lumpectomy from prior breast cancer FINAL DIAGNOSIS MICROSCOPIC EXAMINATION AND DIAGNOSIS 1. SENTINEL LYMPH NODE BIOPSY, RIGHT AXILLARY LYMPH NODE: ONE LYMPH NODE, POSITIVE FOR METASTATIC CARCINOMA (1/1) SEE COMMENT. 2. LUMPECTOMY, RIGHT BREAST: - INVASIVE MAMMARY CARCINOMA, NO SPECIAL TYPE (DUCTAL), 2.3 CM, MSBR GRADE 3/3. - DUCTAL CARCINOMA IN SITU, INTERMEDIATE GRADE. - EXTENSIVE INTRADUCTAL COMPONENT NEGATIVE (JCRT) - LYMPHOVASCULAR INVASION IDENTIFIED. - SEE COMMENT. 3. AXILLARY DISSECTION, RIGHT AXILLA: TWO OF SIX LYMPH NODES POSITIVE FOR METASTATIC TUMOR (2/6). SEE COMMENT. COMMENT 1. Tumor is seen within vessels adjacent to lymph node capsule. No definitive extension beyond capsule into soft tissues  is identified. The largest extent of metastatic tumor is 0.9 cm. 2. Invasive tumor approaches to within 1 mm of the deep aspect of the specimen and 4 mm of both the anterior and superior aspects of the specimen. Despite these relatively close margins, it appears that the tumor has been completely excised. Fragments of skeletal muscle are seen at the deep aspect of the specimen and tumor is not seen to extend into skeletal muscle. Ductal carcinoma in situ approaches to within 3 mm of the posterior margin and 5 mm of the anterior margin. Ductal carcinoma is confined to the area of invasive tumor in the sections examined. A breast prognostic marker panel has already been issued for this tumor ([id number]). It shows that the invasive component is positive for estrogen and progesterone receptors and negative for Her 2 neu over expression is measures by the Herceptest method. For complete details please see the original report. 3. Extracapsular extension in not identified. The largest focus of metastatic tumor measures 1.2 cm. ONCOLOGY TABLE-BREAST, INCISIONAL/EXCISIONAL BIOPSY OR MASTECTOMY WITH LYMPH NODES 1. Maximum tumor size (cm): 2.3 cm 2. Margins: Invasive component, distance to closest margin: 52m, posterior In situ component, distance to closest margin: 319m posterior 3. Vascular/Lymphatic invasion: Identified 4. Histology, invasive component: No special type (ductal). 5. Grade, invasive component (Elston-Ellis modified Scarff-Bloom-Richardson): 3 of 3 Tubule formation grade: 3 of 3 Nuclear pleomorphism grade: 2 of 3 Mitotic grade: 3 of 3 6. % In situ: Less than 5 % of the measured component. 7. Histology, in situ component: Solid pattern 8. Grade, in situ component: Intermediate grade 9. Multicentric (separate tumors in different quadrants): No 10. Multifocal (separate  tumors in same quadrant or biopsy): No 11. Axillary lymph  nodes: #examined: 7 #with metastasis: 3 ITC (isolated tumor cells, < 0.10m): 0 Micrometastasis (> 0.2328m < 28m70m 0 Metastasis > 2 mm: 3 Extracapsular extension: Not identified. 12. TNM Code: pT2, pN1, pMX   ONCOTYPE 11/17/16      RADIOGRAPHIC STUDIES: I have personally reviewed the radiological images as listed and agreed with the findings in the report. No results found.   Bone Density Scan 02/02/17 ASSESSMENT: The BMD measured at Femur Neck Right is 0.757 g/cm2 with a T-score of -2.0. This patient is considered OSTEOPENIC [facility]. L3 was excluded due to degenerative changes. There has been a statistically significant increase in BMD of Dual Femur hips and no change in Lumbar Spine since prior exam dated 02/01/2015. Patient is not a candidate for FRAX assessment. Site Region Measured Date Measured Age YA T-score BMD Significant CHANGE DualFemur Neck Right 02/02/2017 55.6 -2.0 0.757 g/cm2 * AP Spine  L1-L4 (L3) 02/02/2017    55.6         -0.9    1.089 g/cm2   ASSESSMENT & PLAN:  Olivia [name] a [age]. caucasian female with a history of Heart murmur, HLD, and osteoporosis.   1. Malignant neoplasm of upper-inner quadrant of right breast, invasive ductal carcinoma, pT2N0M0, Stage 1B, ER/PR: positive, HER2 negative, Grade 3, Oncotype RS 21 -We previously discussed her imaging findings and the biopsy results in great details. -She underwent a bilateral mastectomy on 11/17/16. Her pathology results showed a 2.3cm tumor, ER/PR positive, HER2 negative, node negative disease, Oncotype score is 21, intermediate risk. -According to the most recent published Tailor X study result, there is no clinical benefit for adjuvant chemotherapy if Oncotype RS 11-25, especially in women age of 50 62 above.  So I do not recommend adjuvant chemotherapy. I recommend her to start adjuvant aromatase inhibitor, due to her prior  history of breast cancer, I recommend her to take AI for 7-10 years. Potential side effects were discussed in great detail. She agreed.  -She had mastectomy, therefore will not require adjuvant radiation -It is likely her current breast cancer is a second primary. 12/24/16 CT CAP and Bone scan were negative for osseous metastasis and metastatic disease in the chest, abdomen, or pelvis. -Had b/l mastectomy and does not need any mammograms. Will continue regular breast exams. -She started Letrozole on 12/18/16. Tolerating well with mild hot flashes.  -She underwent b/l breast reconstruction on 02/11/17 by Dr. DilMarla Roeealed well.  -She is clinically doing well. Lab reviewed, her CBC and CMP are within normal limits. Her physical exam was unremarkable. There is no clinical concern for recurrence. -Continue Letrozole daily  -Survivorship clinic in 07/2017 and F/u with me in 10/2017   2. History of Right Breast Cancer, pT2N1M0, ER+/pr+/her2-, in 2003 -Treated by Dr. PetEston Esters/p lumpectomy and axillary lymph node dissection (3/7 positive lymph nodes), chemo, radiation, tamoxifen for 2 years and Anastrozole for 3 years -total hysterectomy/BSO in 2006    3. Genetic Testing -Based on her young age of prior breast cancer she is eligible for Genetic testing. She agreed and a referral was sent.  -11/17/16 [facility]'s Breast/GYN panel testing did not reveal any pathogenic mutation in any of these genes.   4. Osteopenia  -After being on Arimidex for 3 years she developed osteoporosis.  -She was previously on Fosamax for 5 years. Now she is on Prolia injection for the past 2  years. She receives injections every 6 months.   -02/02/17 DEXA showed significant improvement from osteoporosis to osteopenia. Lowest density at Right Femur Neck with a T-Score of -2 -I encourage her to continue calcium, vitamin D and exercise.  -Repeat bone density scan every 2 years  -She will continue Prolia Injections every  6 months through her PCP.    5. Thyroid Nodules -12/24/16 CT Chest identifies multiple heterogenous appearing thyroid nodules. -Previous CT chest in 2004 noted bilateral thyroid nodules. Does not appear she has had dedicated thyroid US.  -She reports she had a goiter and was on synthroid >15 years ago but TSH level normalized with diet and exercise and she was weaned off medication.  -Her thyroid is prominent with R side nodularity on 01/22/17 exam.  -I will defer to her PCP Dr. Raoul Pitch for further evaluation and management.    PLAN: -Continue Letrozole daily  -Survivorship Clinic in 07/2017  -Lab and f/u in 7 months    No orders of the defined types were placed in this encounter.   All questions were answered. The patient knows to call the clinic with any problems, questions or concerns. I spent 20 minutes counseling the patient face to face. The total time spent in the appointment was 25 minutes and more than 50% was on counseling.     Olivia Merle, MD 03/26/2017   This document serves as a record of services personally performed by Olivia Merle, MD. It was created on her behalf by Joslyn Devon, a trained medical scribe. The creation of this record is based on the scribe's personal observations and the provider's statements to them.    I have reviewed the above documentation for accuracy and completeness, and I agree with the above.

## 2017-03-25 DIAGNOSIS — C50211 Malignant neoplasm of upper-inner quadrant of right female breast: Secondary | ICD-10-CM | POA: Diagnosis not present

## 2017-03-25 DIAGNOSIS — Z170 Estrogen receptor positive status [ER+]: Secondary | ICD-10-CM | POA: Diagnosis not present

## 2017-03-26 ENCOUNTER — Inpatient Hospital Stay: Payer: BLUE CROSS/BLUE SHIELD

## 2017-03-26 ENCOUNTER — Encounter: Payer: Self-pay | Admitting: Hematology

## 2017-03-26 ENCOUNTER — Inpatient Hospital Stay: Payer: BLUE CROSS/BLUE SHIELD | Attending: Hematology | Admitting: Hematology

## 2017-03-26 ENCOUNTER — Telehealth: Payer: Self-pay | Admitting: Hematology

## 2017-03-26 VITALS — BP 136/82 | HR 66 | Temp 98.3°F | Resp 18 | Ht 64.0 in | Wt 149.3 lb

## 2017-03-26 DIAGNOSIS — E785 Hyperlipidemia, unspecified: Secondary | ICD-10-CM | POA: Diagnosis not present

## 2017-03-26 DIAGNOSIS — Z170 Estrogen receptor positive status [ER+]: Secondary | ICD-10-CM

## 2017-03-26 DIAGNOSIS — Z923 Personal history of irradiation: Secondary | ICD-10-CM | POA: Diagnosis not present

## 2017-03-26 DIAGNOSIS — Z79811 Long term (current) use of aromatase inhibitors: Secondary | ICD-10-CM | POA: Diagnosis not present

## 2017-03-26 DIAGNOSIS — Z9013 Acquired absence of bilateral breasts and nipples: Secondary | ICD-10-CM

## 2017-03-26 DIAGNOSIS — C50211 Malignant neoplasm of upper-inner quadrant of right female breast: Secondary | ICD-10-CM | POA: Diagnosis not present

## 2017-03-26 DIAGNOSIS — Z9221 Personal history of antineoplastic chemotherapy: Secondary | ICD-10-CM

## 2017-03-26 DIAGNOSIS — M8580 Other specified disorders of bone density and structure, unspecified site: Secondary | ICD-10-CM

## 2017-03-26 DIAGNOSIS — Z853 Personal history of malignant neoplasm of breast: Secondary | ICD-10-CM

## 2017-03-26 DIAGNOSIS — E042 Nontoxic multinodular goiter: Secondary | ICD-10-CM | POA: Insufficient documentation

## 2017-03-26 DIAGNOSIS — N951 Menopausal and female climacteric states: Secondary | ICD-10-CM

## 2017-03-26 LAB — CBC WITH DIFFERENTIAL/PLATELET
BASOS ABS: 0 10*3/uL (ref 0.0–0.1)
Basophils Relative: 1 %
EOS PCT: 2 %
Eosinophils Absolute: 0.1 10*3/uL (ref 0.0–0.5)
HCT: 41.1 % (ref 34.8–46.6)
HEMOGLOBIN: 13.8 g/dL (ref 11.6–15.9)
LYMPHS ABS: 1.5 10*3/uL (ref 0.9–3.3)
LYMPHS PCT: 36 %
MCH: 29.1 pg (ref 25.1–34.0)
MCHC: 33.6 g/dL (ref 31.5–36.0)
MCV: 86.5 fL (ref 79.5–101.0)
Monocytes Absolute: 0.3 10*3/uL (ref 0.1–0.9)
Monocytes Relative: 7 %
NEUTROS PCT: 54 %
Neutro Abs: 2.3 10*3/uL (ref 1.5–6.5)
PLATELETS: 218 10*3/uL (ref 145–400)
RBC: 4.75 MIL/uL (ref 3.70–5.45)
RDW: 13.7 % (ref 11.2–14.5)
WBC: 4.2 10*3/uL (ref 3.9–10.3)

## 2017-03-26 LAB — COMPREHENSIVE METABOLIC PANEL
ALBUMIN: 4.3 g/dL (ref 3.5–5.0)
ALK PHOS: 54 U/L (ref 40–150)
ALT: 29 U/L (ref 0–55)
AST: 24 U/L (ref 5–34)
Anion gap: 9 (ref 3–11)
BUN: 16 mg/dL (ref 7–26)
CHLORIDE: 106 mmol/L (ref 98–109)
CO2: 27 mmol/L (ref 22–29)
CREATININE: 0.9 mg/dL (ref 0.60–1.10)
Calcium: 9.7 mg/dL (ref 8.4–10.4)
GFR calc Af Amer: >60 mL/min (ref >60)
GFR calc non Af Amer: >60 mL/min (ref >60)
GLUCOSE: 89 mg/dL (ref 70–140)
Potassium: 4.4 mmol/L (ref 3.5–5.1)
SODIUM: 142 mmol/L (ref 136–145)
Total Bilirubin: 0.4 mg/dL (ref 0.2–1.2)
Total Protein: 7.1 g/dL (ref 6.4–8.3)

## 2017-03-26 NOTE — Telephone Encounter (Signed)
Scheduled appt per 2/21 los - patient aware of apt date and time - did not want print out/ my chart active.

## 2017-03-30 ENCOUNTER — Ambulatory Visit: Payer: BLUE CROSS/BLUE SHIELD

## 2017-04-02 ENCOUNTER — Ambulatory Visit (INDEPENDENT_AMBULATORY_CARE_PROVIDER_SITE_OTHER): Payer: BLUE CROSS/BLUE SHIELD

## 2017-04-02 DIAGNOSIS — M810 Age-related osteoporosis without current pathological fracture: Secondary | ICD-10-CM | POA: Diagnosis not present

## 2017-04-02 MED ORDER — DENOSUMAB 60 MG/ML ~~LOC~~ SOLN
60.0000 mg | Freq: Once | SUBCUTANEOUS | Status: AC
  Administered 2017-04-02: 60 mg via SUBCUTANEOUS

## 2017-04-02 NOTE — Progress Notes (Addendum)
Patient presents today for Prolia injection. Given with no incidence or problems. Patient left with no complaints. Medical screening examination/treatment/procedure(s) were performed by non-physician practitioner and as supervising physician I was immediately available for consultation/collaboration.  I agree with above assessment and plan.  Electronically Signed by: Howard Pouch, DO [facility]

## 2017-04-14 ENCOUNTER — Ambulatory Visit (INDEPENDENT_AMBULATORY_CARE_PROVIDER_SITE_OTHER): Payer: BLUE CROSS/BLUE SHIELD | Admitting: Family Medicine

## 2017-04-14 ENCOUNTER — Encounter: Payer: Self-pay | Admitting: Family Medicine

## 2017-04-14 VITALS — BP 112/72 | HR 63 | Temp 98.3°F | Ht 65.0 in | Wt 145.4 lb

## 2017-04-14 DIAGNOSIS — Z0000 Encounter for general adult medical examination without abnormal findings: Secondary | ICD-10-CM

## 2017-04-14 DIAGNOSIS — E039 Hypothyroidism, unspecified: Secondary | ICD-10-CM

## 2017-04-14 DIAGNOSIS — Z131 Encounter for screening for diabetes mellitus: Secondary | ICD-10-CM | POA: Diagnosis not present

## 2017-04-14 DIAGNOSIS — E042 Nontoxic multinodular goiter: Secondary | ICD-10-CM | POA: Diagnosis not present

## 2017-04-14 DIAGNOSIS — C50211 Malignant neoplasm of upper-inner quadrant of right female breast: Secondary | ICD-10-CM | POA: Diagnosis not present

## 2017-04-14 DIAGNOSIS — M810 Age-related osteoporosis without current pathological fracture: Secondary | ICD-10-CM | POA: Diagnosis not present

## 2017-04-14 DIAGNOSIS — Z13220 Encounter for screening for lipoid disorders: Secondary | ICD-10-CM | POA: Diagnosis not present

## 2017-04-14 DIAGNOSIS — Z170 Estrogen receptor positive status [ER+]: Secondary | ICD-10-CM

## 2017-04-14 LAB — T3, FREE: T3, Free: 3.1 pg/mL (ref 2.3–4.2)

## 2017-04-14 LAB — LIPID PANEL
Cholesterol: 195 mg/dL (ref 0–200)
HDL: 58.4 mg/dL (ref >39.00)
LDL Cholesterol: 112 mg/dL — ABNORMAL HIGH (ref 0–99)
NonHDL: 136.57
Total CHOL/HDL Ratio: 3
Triglycerides: 121 mg/dL (ref 0.0–149.0)
VLDL: 24.2 mg/dL (ref 0.0–40.0)

## 2017-04-14 LAB — TSH: TSH: 1.77 u[IU]/mL (ref 0.35–4.50)

## 2017-04-14 LAB — HEMOGLOBIN A1C: HEMOGLOBIN A1C: 5.8 % (ref 4.6–6.5)

## 2017-04-14 LAB — T4, FREE: Free T4: 0.8 ng/dL (ref 0.60–1.60)

## 2017-04-14 NOTE — Progress Notes (Signed)
Patient ID: Olivia [name], female  DOB: [birth year], 57 y.o.   MRN: [id number] Patient Care Team    Relationship Specialty Notifications Start End  Ma Hillock, DO PCP - General Family Medicine  12/26/14   Truitt Merle, MD Consulting Physician Hematology  10/15/16   Jovita Kussmaul, MD Consulting Physician General Surgery  10/15/16   Gery Pray, MD Consulting Physician Radiation Oncology  10/15/16   Sable Feil, MD Consulting Physician Gastroenterology  04/14/17   Molli Posey, MD Consulting Physician Obstetrics and Gynecology  04/14/17     Chief Complaint  Patient presents with  . Annual Exam    CPE    Subjective:  Olivia [name] is a 57 y.o.  Female  present for CPE. All past medical history, surgical history, allergies, family history, immunizations, medications and social history were updated  in the electronic medical record today. All recent labs, ED visits and hospitalizations within the last year were reviewed.  Health maintenance:  Colonoscopy: completed by Dr. Sharlett Iles January 2014. Ten-year follow-up. Mammogram: completed: 09/30/2016, patient being treated for recurrent breast cancer. Cervical cancer screening: last pap: History of hysterectomy, pelvic exams completed by Dr. Matthew Saras. Immunizations: tdap UTD 2013, Influenza UTD 2018 (encouraged yearly). Shingrix discussed today and pt will ask onc if agree with vaccine.  Infectious disease screening: HIV and  Hep C completed DEXA: History of osteoporosis, on pearly injections every 6 months. Last DEXA scan 02/02/2017 improving -2.0. Follow-up every 2 years Assistive device: None Oxygen use: None Patient has a Dental home. Hospitalizations/ED visits: Reviewed   Depression screen [facility] 2/9 04/14/2017 01/29/2012  Decreased Interest 0 0  Down, Depressed, Hopeless 0 0  PHQ - 2 Score 0 0   No flowsheet data found.   Current Exercise Habits: Home exercise routine;Structured exercise class, Type of exercise:  yoga;Other - see comments;stretching, Time (Minutes): 45, Frequency (Times/Week): 5, Weekly Exercise (Minutes/Week): 225, Intensity: Moderate     Immunization History  Administered Date(s) Administered  . Influenza Split 11/26/2011  . Influenza-Unspecified 12/05/2014, 10/03/2015, 11/10/2016  . Tdap 01/29/2012     Past Medical History:  Diagnosis Date  . Breast cancer ([place])    lumpectomy; chemotherapy and radiation  . Family history of breast cancer   . Genetic testing 10/30/2016   Breast/GYN panel (23 genes) @ [facility] - No pathogenic mutations detected  . GERD (gastroesophageal reflux disease)    OTC medication  . Heart murmur    as a child - Echo in early 20s "ok"  . History of echocardiogram    Echo 4/17: EF 55-60%, GLS -19.6%, normal wall motion  . History of exercise stress test    ETT 4/17 - No ST changes  . HLD (hyperlipidemia)   . Hypothyroidism   . Osteoporosis   . Personal history of chemotherapy   . Personal history of radiation therapy    No Known Allergies Past Surgical History:  Procedure Laterality Date  . BREAST LUMPECTOMY Right 2003  . BREAST RECONSTRUCTION WITH PLACEMENT OF TISSUE EXPANDER AND FLEX HD (ACELLULAR HYDRATED DERMIS) Bilateral 11/17/2016   Procedure: IMEDITATE BREAST RECONSTRUCTION WITH PLACEMENT OF TISSUE EXPANDER AND FLEX HD (ACELLULAR HYDRATED DERMIS);  Surgeon: Wallace Going, DO;  Location: [place];  Service: Plastics;  Laterality: Bilateral;  . BREAST SURGERY  2003 /right   lumpectomy; chemotherapy and radiation  . REMOVAL OF BILATERAL TISSUE EXPANDERS WITH PLACEMENT OF BILATERAL BREAST IMPLANTS Bilateral 02/11/2017   Procedure: REMOVAL OF BILATERAL TISSUE EXPANDERS WITH PLACEMENT  OF BILATERAL SILICONE BREAST IMPLANTS;  Surgeon: Wallace Going, DO;  Location: [place];  Service: Plastics;  Laterality: Bilateral;  . TOTAL ABDOMINAL HYSTERECTOMY W/ BILATERAL SALPINGOOPHORECTOMY     for treatment of breast treatment    . TOTAL MASTECTOMY Bilateral 11/17/2016   Procedure: RIGHT MASTECTOMY AND LEFT PROPHYLATIC MASTECTOMY;  Surgeon: Jovita Kussmaul, MD;  Location[facility] OR;  Service: General;  Laterality: Bilateral;   Family History  Problem Relation Age of Onset  . Diabetes Other        1ST DEGREE RELATIVE  . Coronary artery disease Mother        1ST DEGREE RELATIVE  . Heart attack Mother 31  . Skin cancer Mother   . Diabetes Father   . Kidney disease Father   . Prostate cancer Father 48       deceased 92  . Cancer Brother 19       multiple myeloma   . Lung cancer Maternal Uncle        in 2 mat uncles  . Breast cancer Paternal Aunt 7  . Breast cancer Cousin        dx 21s; daughter of unaffected paternal aunt  . Breast cancer Cousin        dx 21s; another daughter of unaffected paternal aunt  . Esophageal cancer Cousin        pat first cousin   Social History   Socioeconomic History  . Marital status: Married    Spouse name: Not on file  . Number of children: Not on file  . Years of education: Not on file  . Highest education level: Not on file  Social Needs  . Financial resource strain: Not on file  . Food insecurity - worry: Not on file  . Food insecurity - inability: Not on file  . Transportation needs - medical: Not on file  . Transportation needs - non-medical: Not on file  Occupational History  . Not on file  Tobacco Use  . Smoking status: Never Smoker  . Smokeless tobacco: Never Used  Substance and Sexual Activity  . Alcohol use: Yes    Alcohol/week: 0.0 oz    Comment: Rare  . Drug use: No  . Sexual activity: Yes    Birth control/protection: Surgical  Other Topics Concern  . Not on file  Social History Narrative   Married. Bobby.   Children: none. No pets.    Stay at home.    Wears her seatbelt. Smoke alarm in the home.    Feels safe in her relationships.    Exercise 5x week.    Allergies as of 04/14/2017   No Known Allergies     Medication List         Accurate as of 04/14/17 12:19 PM. Always use your most recent med list.          Calcium 500 MG Chew Chew by mouth.   denosumab 60 MG/ML Soln injection Commonly known as:  PROLIA Inject 60 mg into the skin every 6 (six) months. Administer in upper arm, thigh, or abdomen   letrozole 2.5 MG tablet Commonly known as:  FEMARA Take 1 tablet (2.5 mg total) by mouth daily.       All past medical history, surgical history, allergies, family history, immunizations andmedications were updated in the EMR today and reviewed under the history and medication portions of their EMR.     Recent Results (from the past 2160 hour(s))  CBC with  Differential     Status: None   Collection Time: 01/22/17 10:28 AM  Result Value Ref Range   WBC 4.9 3.9 - 10.3 10e3/uL   NEUT# 2.9 1.5 - 6.5 10e3/uL   HGB 13.9 11.6 - 15.9 g/dL   HCT 41.7 34.8 - 46.6 %   Platelets 252 145 - 400 10e3/uL   MCV 85.9 79.5 - 101.0 fL   MCH 28.7 25.1 - 34.0 pg   MCHC 33.4 31.5 - 36.0 g/dL   RBC 4.85 3.70 - 5.45 10e6/uL   RDW 13.7 11.2 - 14.5 %   lymph# 1.5 0.9 - 3.3 10e3/uL   MONO# 0.4 0.1 - 0.9 10e3/uL   Eosinophils Absolute 0.1 0.0 - 0.5 10e3/uL   Basophils Absolute 0.0 0.0 - 0.1 10e3/uL   NEUT% 60.3 38.4 - 76.8 %   LYMPH% 30.2 14.0 - 49.7 %   MONO% 7.3 0.0 - 14.0 %   EOS% 1.5 0.0 - 7.0 %   BASO% 0.7 0.0 - 2.0 %  Comprehensive metabolic panel     Status: None   Collection Time: 01/22/17 10:28 AM  Result Value Ref Range   Sodium 141 136 - 145 mEq/L   Potassium 4.4 3.5 - 5.1 mEq/L   Chloride 104 98 - 109 mEq/L   CO2 27 22 - 29 mEq/L   Glucose 84 70 - 140 mg/dl    Comment: Glucose reference range is for nonfasting patients. Fasting glucose reference range is 70- 100.   BUN 18.0 7.0 - 26.0 mg/dL   Creatinine 1.0 0.6 - 1.1 mg/dL   Total Bilirubin 0.47 0.20 - 1.20 mg/dL   Alkaline Phosphatase 58 40 - 150 U/L   AST 29 5 - 34 U/L   ALT 35 0 - 55 U/L   Total Protein 7.5 6.4 - 8.3 g/dL   Albumin 4.5 3.5 - 5.0 g/dL    Calcium 9.7 8.4 - 10.4 mg/dL   Anion Gap 10 3 - 11 mEq/L   EGFR >60 >60 ml/min/1.73 m2    Comment: eGFR is calculated using the CKD-EPI Creatinine Equation (2009)  CBC with Differential     Status: None   Collection Time: 03/26/17 10:55 AM  Result Value Ref Range   WBC 4.2 3.9 - 10.3 K/uL   RBC 4.75 3.70 - 5.45 MIL/uL   Hemoglobin 13.8 11.6 - 15.9 g/dL   HCT 41.1 34.8 - 46.6 %   MCV 86.5 79.5 - 101.0 fL   MCH 29.1 25.1 - 34.0 pg   MCHC 33.6 31.5 - 36.0 g/dL   RDW 13.7 11.2 - 14.5 %   Platelets 218 145 - 400 K/uL   Neutrophils Relative % 54 %   Neutro Abs 2.3 1.5 - 6.5 K/uL   Lymphocytes Relative 36 %   Lymphs Abs 1.5 0.9 - 3.3 K/uL   Monocytes Relative 7 %   Monocytes Absolute 0.3 0.1 - 0.9 K/uL   Eosinophils Relative 2 %   Eosinophils Absolute 0.1 0.0 - 0.5 K/uL   Basophils Relative 1 %   Basophils Absolute 0.0 0.0 - 0.1 K/uL    Comment: Performed at [facility], [place] [address] [place], [place] [address]  Comprehensive metabolic panel     Status: None   Collection Time: 03/26/17 10:55 AM  Result Value Ref Range   Sodium 142 136 - 145 mmol/L   Potassium 4.4 3.5 - 5.1 mmol/L   Chloride 106 98 - 109 mmol/L   CO2 27 22 - 29 mmol/L  Glucose, Bld 89 70 - 140 mg/dL   BUN 16 7 - 26 mg/dL   Creatinine, Ser 0.90 0.60 - 1.10 mg/dL   Calcium 9.7 8.4 - 10.4 mg/dL   Total Protein 7.1 6.4 - 8.3 g/dL   Albumin 4.3 3.5 - 5.0 g/dL   AST 24 5 - 34 U/L   ALT 29 0 - 55 U/L   Alkaline Phosphatase 54 40 - 150 U/L   Total Bilirubin 0.4 0.2 - 1.2 mg/dL   GFR calc non Af Amer >60 >60 mL/min   GFR calc Af Amer >60 >60 mL/min    Comment: (NOTE) The eGFR has been calculated using the CKD EPI equation. This calculation has not been validated in all clinical situations. eGFR's persistently <60 mL/min signify possible Chronic Kidney Disease.    Anion gap 9 3 - 11    Comment: Performed at [facility], [address], [place], [place]  [address]    Dg Bone Density  Result Date: 02/02/2017 EXAM: DUAL X-RAY ABSORPTIOMETRY (DXA) FOR BONE MINERAL DENSITY IMPRESSION: Referring Physician:  Cira Rue PATIENT: Name: Olivia [name], Olivia [name] Patient ID: [id number] Birth Date: 05/11/1960 Height:     64.0 in. Weight:     144.8 lbs. Sex: Female Measured: 02/02/2017 Indications: Bilateral Ovariectomy (65.51), Breast Cancer History, Caucasian, Estrogen Deficient, Family Hist. (Parent hip fracture), Family History of Osteoporosis, History of Fracture (Adult) (V15.51), History of Osteopenia, Hysterectomy, Letrozole, Postmenopausal Fractures:  None, Wrist Treatments: Calcium (E943.0), Hormone Therapy For Cancer, Prolia, Vitamin D (E933.5) ASSESSMENT: The BMD measured at Femur Neck Right is 0.757 g/cm2 with a T-score of -2.0. This patient is considered OSTEOPENIC according to [facility]) criteria. L3 was excluded due to degenerative changes. There has been a statistically significant increase in BMD of Dual Femur hips and no change in Lumbar Spine since prior exam dated 02/01/2015. Patient is not a candidate for FRAX assessment. Site Region Measured Date Measured Age YA T-score BMD Significant CHANGE DualFemur Neck Right 02/02/2017 55.6 -2.0 0.757 g/cm2 * AP Spine  L1-L4 (L3) 02/02/2017    55.6         -0.8    1.089 g/cm2 World Health Organization [facility]) criteria for post-menopausal, Caucasian Women: Normal       T-score at or above -1 SD Osteopenia   T-score between -1 and -2.5 SD Osteoporosis T-score at or below -2.5 SD RECOMMENDATION: [place] recommends that FDA-approved medical therapies be considered in postmenopausal women and men age 19 or older with a: 1. Hip or vertebral (clinical or morphometric) fracture. 2. T-score of <-2.5 at the spine or hip. 3. Ten-year fracture probability by FRAX of 3% or greater for hip fracture or 20% or greater for major osteoporotic fracture. All treatment decisions require clinical  judgment and consideration of individual patient factors, including patient preferences, co-morbidities, previous drug use, risk factors not captured in the FRAX model (e.g. falls, vitamin D deficiency, increased bone turnover, interval significant decline in bone density) and possible under - or over-estimation of fracture risk by FRAX. All patients should ensure an adequate intake of dietary calcium (1200 mg/d) and vitamin D (800 IU daily) unless contraindicated. FOLLOW-UP: People with diagnosed cases of osteoporosis or at high risk for fracture should have regular bone mineral density tests. For patients eligible for Medicare, routine testing is allowed once every 2 years. The testing frequency can be increased to one year for patients who have rapidly progressing disease, those who are receiving or discontinuing  medical therapy to restore bone mass, or have additional risk factors. I have reviewed this report, and agree with the above findings. Mark A. Thornton Papas, M.D. [facility] Radiology Electronically Signed   By: Lavonia Dana M.D.   On: 02/02/2017 14:45     ROS: 14 pt review of systems performed and negative (unless mentioned in an HPI)  Objective: BP 112/72 (BP Location: Left Arm, Patient Position: Sitting, Cuff Size: Normal)   Pulse 63   Temp 98.3 F (36.8 C) (Oral)   Ht '5\' 5"'$  (1.651 m)   Wt 145 lb 6.4 oz (66 kg)   SpO2 99%   BMI 24.20 kg/m  Gen: Afebrile. No acute distress. Nontoxic in appearance, well-developed, well-nourished,  Pleasant caucasian female.  HENT: AT. [place]. Bilateral TM visualized and normal in appearance, normal external auditory canal. MMM, no oral lesions, adequate dentition. Bilateral nares within normal limits. Throat without erythema, ulcerations or exudates. no Cough on exam, no hoarseness on exam. Eyes:Pupils Equal Round Reactive to light, Extraocular movements intact,  Conjunctiva without redness, discharge or icterus. Neck/lymp/endocrine: Supple,no lymphadenopathy,   Thyromegaly present (chronic) CV: RRR no murmur, no edema, +2/4 P posterior tibialis pulses. no carotid bruits. No JVD. Chest: CTAB, no wheeze, rhonchi or crackles. normal Respiratory effort. good Air movement. Abd: Soft. flat. NTND. BS present. no Masses palpated. No hepatosplenomegaly. No rebound tenderness or guarding. Skin: no rashes, purpura or petechiae. Warm and well-perfused. Skin intact. Neuro/Msk:  Normal gait. PERLA. EOMi. Alert. Oriented x3.  Cranial nerves II through XII intact. Muscle strength 5/5 upper/lower extremity. DTRs equal bilaterally. Psych: Normal affect, dress and demeanor. Normal speech. Normal thought content and judgment.  No exam data present  Assessment/plan: Olivia [name] is a 57 y.o. female present for CPE. Encounter for preventive health examination Patient was encouraged to exercise greater than 150 minutes a week. Patient was encouraged to choose a diet filled with fresh fruits and vegetables, and lean meats. AVS provided to patient today for education/recommendation on gender specific health and safety maintenance. Colonoscopy: completed by Dr. Sharlett Iles January 2014. Follow-up 2024 Mammogram: completed: 09/30/2016, patient being treated for recurrent breast cancer. Cervical cancer screening: last pap: History of hysterectomy, pelvic exams completed by Dr. Matthew Saras. Immunizations: tdap UTD 2013, Influenza UTD 2018 (encouraged yearly). Shingrix discussed today and pt will ask onc if agree with vaccine.  Infectious disease screening: HIV and  Hep C completed DEXA: History of osteoporosis, on pearly injections every 6 months. Last DEXA scan 02/02/2017 improving -2.0. Follow-up every 2 years Osteoporosis, unspecified osteoporosis type, unspecified pathological fracture presence Continue prolia injections every 6 months. Vitamin D and calcium if not contraindicated, weightbearing exercises - TSH Next DEXA due 2020 Malignant neoplasm of upper-inner quadrant of  right breast in female, estrogen receptor positive ([place]) Currently under the care of oncology. On Femara.  Diabetes mellitus screening - Hemoglobin A1c Screening cholesterol level - Lipid panel Multiple thyroid nodules/history of hypothyroid - History of multiple thyroid nodules, easily palpable on exam today. Patient has had normal TSH levels for greater than 10 years. She's had one ultrasound at diagnosis only many years ago and was told normal. No records of ultrasound available. - TSH - T3, free - T4, free      No Follow-up on file.  Electronically signed by: Howard Pouch, DO [place]

## 2017-04-14 NOTE — Patient Instructions (Signed)
It was a pleasure to see you today.   We will call you with labs and forward results to Dr. Burr Medico as well.   Health Maintenance, Female Adopting a healthy lifestyle and getting preventive care can go a long way to promote health and wellness. Talk with your health care provider about what schedule of regular examinations is right for you. This is a good chance for you to check in with your provider about disease prevention and staying healthy. In between checkups, there are plenty of things you can do on your own. Experts have done a lot of research about which lifestyle changes and preventive measures are most likely to keep you healthy. Ask your health care provider for more information. Weight and diet Eat a healthy diet  Be sure to include plenty of vegetables, fruits, low-fat dairy products, and lean protein.  Do not eat a lot of foods high in solid fats, added sugars, or salt.  Get regular exercise. This is one of the most important things you can do for your health. ? Most adults should exercise for at least 150 minutes each week. The exercise should increase your heart rate and make you sweat (moderate-intensity exercise). ? Most adults should also do strengthening exercises at least twice a week. This is in addition to the moderate-intensity exercise.  Maintain a healthy weight  Body mass index (BMI) is a measurement that can be used to identify possible weight problems. It estimates body fat based on height and weight. Your health care provider can help determine your BMI and help you achieve or maintain a healthy weight.  For females 17 years of age and older: ? A BMI below 18.5 is considered underweight. ? A BMI of 18.5 to 24.9 is normal. ? A BMI of 25 to 29.9 is considered overweight. ? A BMI of 30 and above is considered obese.  Watch levels of cholesterol and blood lipids  You should start having your blood tested for lipids and cholesterol at 56 years of age, then have  this test every 5 years.  You may need to have your cholesterol levels checked more often if: ? Your lipid or cholesterol levels are high. ? You are older than 56 years of age. ? You are at high risk for heart disease.  Cancer screening Lung Cancer  Lung cancer screening is recommended for adults 42-105 years old who are at high risk for lung cancer because of a history of smoking.  A yearly low-dose CT scan of the lungs is recommended for people who: ? Currently smoke. ? Have quit within the past 15 years. ? Have at least a 30-pack-year history of smoking. A pack year is smoking an average of one pack of cigarettes a day for 1 year.  Yearly screening should continue until it has been 15 years since you quit.  Yearly screening should stop if you develop a health problem that would prevent you from having lung cancer treatment.  Breast Cancer  Practice breast self-awareness. This means understanding how your breasts normally appear and feel.  It also means doing regular breast self-exams. Let your health care provider know about any changes, no matter how small.  If you are in your 20s or 30s, you should have a clinical breast exam (CBE) by a health care provider every 1-3 years as part of a regular health exam.  If you are 63 or older, have a CBE every year. Also consider having a breast X-ray (mammogram) every year.  If you have a family history of breast cancer, talk to your health care provider about genetic screening.  If you are at high risk for breast cancer, talk to your health care provider about having an MRI and a mammogram every year.  Breast cancer gene (BRCA) assessment is recommended for women who have family members with BRCA-related cancers. BRCA-related cancers include: ? Breast. ? Ovarian. ? Tubal. ? Peritoneal cancers.  Results of the assessment will determine the need for genetic counseling and BRCA1 and BRCA2 testing.  Cervical Cancer Your health care  provider may recommend that you be screened regularly for cancer of the pelvic organs (ovaries, uterus, and vagina). This screening involves a pelvic examination, including checking for microscopic changes to the surface of your cervix (Pap test). You may be encouraged to have this screening done every 3 years, beginning at age 8.  For women ages 4-65, health care providers may recommend pelvic exams and Pap testing every 3 years, or they may recommend the Pap and pelvic exam, combined with testing for human papilloma virus (HPV), every 5 years. Some types of HPV increase your risk of cervical cancer. Testing for HPV may also be done on women of any age with unclear Pap test results.  Other health care providers may not recommend any screening for nonpregnant women who are considered low risk for pelvic cancer and who do not have symptoms. Ask your health care provider if a screening pelvic exam is right for you.  If you have had past treatment for cervical cancer or a condition that could lead to cancer, you need Pap tests and screening for cancer for at least 20 years after your treatment. If Pap tests have been discontinued, your risk factors (such as having a new sexual partner) need to be reassessed to determine if screening should resume. Some women have medical problems that increase the chance of getting cervical cancer. In these cases, your health care provider may recommend more frequent screening and Pap tests.  Colorectal Cancer  This type of cancer can be detected and often prevented.  Routine colorectal cancer screening usually begins at 56 years of age and continues through 56 years of age.  Your health care provider may recommend screening at an earlier age if you have risk factors for colon cancer.  Your health care provider may also recommend using home test kits to check for hidden blood in the stool.  A small camera at the end of a tube can be used to examine your colon  directly (sigmoidoscopy or colonoscopy). This is done to check for the earliest forms of colorectal cancer.  Routine screening usually begins at age 60.  Direct examination of the colon should be repeated every 5-10 years through 56 years of age. However, you may need to be screened more often if early forms of precancerous polyps or small growths are found.  Skin Cancer  Check your skin from head to toe regularly.  Tell your health care provider about any new moles or changes in moles, especially if there is a change in a mole's shape or color.  Also tell your health care provider if you have a mole that is larger than the size of a pencil eraser.  Always use sunscreen. Apply sunscreen liberally and repeatedly throughout the day.  Protect yourself by wearing long sleeves, pants, a wide-brimmed hat, and sunglasses whenever you are outside.  Heart disease, diabetes, and high blood pressure  High blood pressure causes heart disease and  increases the risk of stroke. High blood pressure is more likely to develop in: ? People who have blood pressure in the high end of the normal range (130-139/85-89 mm Hg). ? People who are overweight or obese. ? People who are African American.  If you are 34-35 years of age, have your blood pressure checked every 3-5 years. If you are 32 years of age or older, have your blood pressure checked every year. You should have your blood pressure measured twice-once when you are at a hospital or clinic, and once when you are not at a hospital or clinic. Record the average of the two measurements. To check your blood pressure when you are not at a hospital or clinic, you can use: ? An automated blood pressure machine at a pharmacy. ? A home blood pressure monitor.  If you are between 25 years and 42 years old, ask your health care provider if you should take aspirin to prevent strokes.  Have regular diabetes screenings. This involves taking a blood sample to  check your fasting blood sugar level. ? If you are at a normal weight and have a low risk for diabetes, have this test once every three years after 56 years of age. ? If you are overweight and have a high risk for diabetes, consider being tested at a younger age or more often. Preventing infection Hepatitis B  If you have a higher risk for hepatitis B, you should be screened for this virus. You are considered at high risk for hepatitis B if: ? You were born in a country where hepatitis B is common. Ask your health care provider which countries are considered high risk. ? Your parents were born in a high-risk country, and you have not been immunized against hepatitis B (hepatitis B vaccine). ? You have HIV or AIDS. ? You use needles to inject street drugs. ? You live with someone who has hepatitis B. ? You have had sex with someone who has hepatitis B. ? You get hemodialysis treatment. ? You take certain medicines for conditions, including cancer, organ transplantation, and autoimmune conditions.  Hepatitis C  Blood testing is recommended for: ? Everyone born from [birth year] through 1964. ? Anyone with known risk factors for hepatitis C.  Sexually transmitted infections (STIs)  You should be screened for sexually transmitted infections (STIs) including gonorrhea and chlamydia if: ? You are sexually active and are younger than 56 years of age. ? You are older than 56 years of age and your health care provider tells you that you are at risk for this type of infection. ? Your sexual activity has changed since you were last screened and you are at an increased risk for chlamydia or gonorrhea. Ask your health care provider if you are at risk.  If you do not have HIV, but are at risk, it may be recommended that you take a prescription medicine daily to prevent HIV infection. This is called pre-exposure prophylaxis (PrEP). You are considered at risk if: ? You are sexually active and do not regularly  use condoms or know the HIV status of your partner(s). ? You take drugs by injection. ? You are sexually active with a partner who has HIV.  Talk with your health care provider about whether you are at high risk of being infected with HIV. If you choose to begin PrEP, you should first be tested for HIV. You should then be tested every 3 months for as long as you are taking  PrEP. Pregnancy  If you are premenopausal and you may become pregnant, ask your health care provider about preconception counseling.  If you may become pregnant, take 400 to 800 micrograms (mcg) of folic acid every day.  If you want to prevent pregnancy, talk to your health care provider about birth control (contraception). Osteoporosis and menopause  Osteoporosis is a disease in which the bones lose minerals and strength with aging. This can result in serious bone fractures. Your risk for osteoporosis can be identified using a bone density scan.  If you are 24 years of age or older, or if you are at risk for osteoporosis and fractures, ask your health care provider if you should be screened.  Ask your health care provider whether you should take a calcium or vitamin D supplement to lower your risk for osteoporosis.  Menopause may have certain physical symptoms and risks.  Hormone replacement therapy may reduce some of these symptoms and risks. Talk to your health care provider about whether hormone replacement therapy is right for you. Follow these instructions at home:  Schedule regular health, dental, and eye exams.  Stay current with your immunizations.  Do not use any tobacco products including cigarettes, chewing tobacco, or electronic cigarettes.  If you are pregnant, do not drink alcohol.  If you are breastfeeding, limit how much and how often you drink alcohol.  Limit alcohol intake to no more than 1 drink per day for nonpregnant women. One drink equals 12 ounces of beer, 5 ounces of wine, or 1 ounces  of hard liquor.  Do not use street drugs.  Do not share needles.  Ask your health care provider for help if you need support or information about quitting drugs.  Tell your health care provider if you often feel depressed.  Tell your health care provider if you have ever been abused or do not feel safe at home. This information is not intended to replace advice given to you by your health care provider. Make sure you discuss any questions you have with your health care provider. Document Released: 08/05/2010 Document Revised: 06/28/2015 Document Reviewed: 10/24/2014 Elsevier Interactive Patient Education  [employer].

## 2017-04-15 ENCOUNTER — Telehealth: Payer: Self-pay | Admitting: Family Medicine

## 2017-04-15 DIAGNOSIS — E042 Nontoxic multinodular goiter: Secondary | ICD-10-CM

## 2017-04-15 NOTE — Telephone Encounter (Signed)
Please inform patient the following information: Her cholesterol looks great. Her diabetes screen is just mildly elevated at 5.8, in the prediabetic range.No medications needed at this time, however keeping up with the exercise and low carbohydrate/sugar content in diet. 6 mos follow up recommended.  Thyroid is functioning normal. However, I would recommend a thyroid US to be completed given h/o one [place] only many years ago and in review of her chest CT in November, it  showed some nodules > 2 cm. Typically larger nodules need to be monitored every few years and if large enough may require biopsy. I have placed this order and they should be calling to schedule her.

## 2017-04-15 NOTE — Telephone Encounter (Signed)
Left detailed message with results and instructions on patient voice mail per DPR 

## 2017-04-20 ENCOUNTER — Other Ambulatory Visit: Payer: BLUE CROSS/BLUE SHIELD

## 2017-04-21 ENCOUNTER — Encounter: Payer: Self-pay | Admitting: Hematology

## 2017-04-27 ENCOUNTER — Ambulatory Visit
Admission: RE | Admit: 2017-04-27 | Discharge: 2017-04-27 | Disposition: A | Discharge status: Home / Self Care | Payer: BLUE CROSS/BLUE SHIELD | Source: Ambulatory Visit | Attending: Family Medicine | Admitting: Family Medicine

## 2017-04-27 DIAGNOSIS — E042 Nontoxic multinodular goiter: Secondary | ICD-10-CM

## 2017-04-28 ENCOUNTER — Telehealth: Payer: Self-pay | Admitting: Family Medicine

## 2017-04-28 DIAGNOSIS — E042 Nontoxic multinodular goiter: Secondary | ICD-10-CM

## 2017-04-28 DIAGNOSIS — E010 Iodine-deficiency related diffuse (endemic) goiter: Secondary | ICD-10-CM

## 2017-04-28 NOTE — Telephone Encounter (Signed)
Spoke with patient reviewed [place] results ,instructions and information. Patient verbalized understanding.

## 2017-04-28 NOTE — Telephone Encounter (Signed)
Please inform patient the following information: Her thyroid US did show many nodules, which she was aware she had. A few of them now meets criteria  for biopsy. This does not mean they are cancerous (she has had recurrent breast cancer so this may be scary for her). It means they need to be tested to make sure nothing suspicious is evolving with them secondary to their size.  I have placed a referral to endocrinology that will evaluate and treat her thyroid nodules.  They will meet with her and be able to explain the procedure/bx in detail.

## 2017-05-12 DIAGNOSIS — C50912 Malignant neoplasm of unspecified site of left female breast: Secondary | ICD-10-CM | POA: Diagnosis not present

## 2017-05-12 DIAGNOSIS — C50911 Malignant neoplasm of unspecified site of right female breast: Secondary | ICD-10-CM | POA: Diagnosis not present

## 2017-05-20 DIAGNOSIS — C50912 Malignant neoplasm of unspecified site of left female breast: Secondary | ICD-10-CM | POA: Diagnosis not present

## 2017-05-20 DIAGNOSIS — C50911 Malignant neoplasm of unspecified site of right female breast: Secondary | ICD-10-CM | POA: Diagnosis not present

## 2017-06-01 NOTE — Progress Notes (Signed)
Patient ID: Olivia [name], female   DOB: Jun 20, 1960, 57 y.o.   MRN: [id number]          Reason for Appointment: Goiter, new consultation    History of Present Illness:   Patient has been referred by her PCP Dr. Raoul Pitch  The patient's thyroid enlargement was first discovered around the year 1999 At that time she was referred to an endocrinologist also She was also told that she had mild hypothyroidism and was given levothyroxine for a couple of years She does not remember details of any further evaluation no previous ultrasound reports are available No previous biopsy has been done  Recent history: Patient has not noticed any change in the swelling in her neck, she says she has had a swelling on the right side for quite some time  She has had difficulty no with swallowing  Does not feel like she has any choking sensation in her neck or pressure in any position or when lying down. She was recently evaluated by her new PCP and she had a goiter on exam she was sent for an ultrasound, details below   Lab Results  Component Value Date   FREET4 0.80 04/14/2017   FREET4 0.78 12/26/2014   TSH 1.77 04/14/2017   TSH 2.54 12/26/2014   TSH 1.24 01/29/2012    She has had an ultrasound exam in 04/2017 which shows about 12-13 nodules in her thyroid  Significant nodules were described as follows:  Nodule # 2: Location: Right; Superior  Maximum size: 1.9 cm; Other 2 dimensions: 1.7 x 1.5 cm  Composition: mixed cystic and solid (1)  Echogenicity: isoechoic (1)  Shape: taller-than-wide (3)  Margins: smooth (0)  Echogenic foci: none (0)  ACR TI-RADS total points: 5.   Nodule # 6:  Location: Right; Inferior  Maximum size: 1.6 cm; Other 2 dimensions: 1.4 x 1.2 cm  Composition: solid/almost completely solid (2)  Echogenicity: isoechoic (1)  Shape: taller-than-wide (3)  Margins: smooth (0)  Echogenic foci: none (0)  ACR TI-RADS total points: 6.   Allergies  as of 06/02/2017   No Known Allergies     Medication List        Accurate as of 06/02/17  4:26 PM. Always use your most recent med list.          Calcium 500 MG Chew Chew by mouth.   denosumab 60 MG/ML Soln injection Commonly known as:  PROLIA Inject 60 mg into the skin every 6 (six) months. Administer in upper arm, thigh, or abdomen   letrozole 2.5 MG tablet Commonly known as:  FEMARA Take 1 tablet (2.5 mg total) by mouth daily.       Allergies: No Known Allergies  Past Medical History:  Diagnosis Date  . Breast cancer ([place]) 2018   Lumpectomy, chemotherapy and radiation with recurrence 2018. Now on Femara  . Family history of breast cancer   . Genetic testing 10/30/2016   Breast/GYN panel (23 genes) @ [facility] - No pathogenic mutations detected  . GERD (gastroesophageal reflux disease)    OTC medication  . Heart murmur    as a child - Echo in early 20s "ok"  . History of echocardiogram 05/2015   Echo 4/17: EF 55-60%, GLS -19.6%, normal wall motion  . History of exercise stress test 05/2015   ETT 4/17 - No ST changes  . HLD (hyperlipidemia)   . Multiple thyroid nodules 2009   Multiple nodules: Normal ultrasound reported at diagnosis  . Osteoporosis   .  Personal history of chemotherapy   . Personal history of radiation therapy     Past Surgical History:  Procedure Laterality Date  . BREAST LUMPECTOMY Right 2003  . BREAST RECONSTRUCTION WITH PLACEMENT OF TISSUE EXPANDER AND FLEX HD (ACELLULAR HYDRATED DERMIS) Bilateral 11/17/2016   Procedure: IMEDITATE BREAST RECONSTRUCTION WITH PLACEMENT OF TISSUE EXPANDER AND FLEX HD (ACELLULAR HYDRATED DERMIS);  Surgeon: Wallace Going, DO;  Location: [facility];  Service: Plastics;  Laterality: Bilateral;  . BREAST SURGERY  2003 /right   lumpectomy; chemotherapy and radiation  . REMOVAL OF BILATERAL TISSUE EXPANDERS WITH PLACEMENT OF BILATERAL BREAST IMPLANTS Bilateral 02/11/2017   Procedure: REMOVAL OF BILATERAL TISSUE  EXPANDERS WITH PLACEMENT OF BILATERAL SILICONE BREAST IMPLANTS;  Surgeon: Wallace Going, DO;  Location: [facility];  Service: Plastics;  Laterality: Bilateral;  . TOTAL ABDOMINAL HYSTERECTOMY W/ BILATERAL SALPINGOOPHORECTOMY     for treatment of breast treatment  . TOTAL MASTECTOMY Bilateral 11/17/2016   Procedure: RIGHT MASTECTOMY AND LEFT PROPHYLATIC MASTECTOMY;  Surgeon: Jovita Kussmaul, MD;  Location: [facility];  Service: General;  Laterality: Bilateral;    Family History  Problem Relation Age of Onset  . Diabetes Other        1ST DEGREE RELATIVE  . Coronary artery disease Mother        1ST DEGREE RELATIVE  . Heart attack Mother 58  . Skin cancer Mother   . Diabetes Father   . Kidney disease Father   . Prostate cancer Father 74       deceased 70  . Cancer Brother 34       multiple myeloma   . Lung cancer Maternal Uncle        in 2 mat uncles  . Breast cancer Paternal Aunt 24  . Breast cancer Cousin        dx 21s; daughter of unaffected paternal aunt  . Breast cancer Cousin        dx 74s; another daughter of unaffected paternal aunt  . Esophageal cancer Cousin        pat first cousin    Social History:  reports that she has never smoked. She has never used smokeless tobacco. She reports that she drinks alcohol. She reports that she does not use drugs.    Review of Systems  Constitutional: Negative for weight loss.  HENT: Negative for trouble swallowing.   Respiratory: Negative for shortness of breath.   Cardiovascular: Negative for leg swelling.  Endocrine: Negative for fatigue.  Musculoskeletal: Negative for joint pain.  Skin: Negative for rash.  Neurological: Negative for weakness.    She has been treated with Prolia for osteoporosis secondary to breast cancer chemotherapy    Examination:   BP 118/78 (BP Location: Left Arm, Patient Position: Sitting, Cuff Size: Normal)   Pulse 71   Ht '5\' 5"'$  (1.651 m)   Wt 147 lb 3.2 oz (66.8 kg)   SpO2 97%    BMI 24.50 kg/m    General Appearance: pleasant,          Eyes: No abnormal prominence or eyelid swelling.          Neck: The thyroid is enlarged mostly on the right. Neck circumference is 37 cm over the mid thyroid She has multiple nodules felt along the right lobe extending superiorly also Ischemic nodule also present Also has about a 1.5-2 cm nodule on the left superior pole also There is no stridor. Here is no lymphadenopathy.     Cardiovascular:  Normal  heart sounds, no murmur Respiratory:  Lungs clear Neurological: REFLEXES: at biceps are normal.  Skin: no rash        Assessment/Plan:  Multinodular goiter, long-standing with numerous nodules, mostly under 2 cm Overall goiter on exam is relatively small and not causing local pressure symptoms She is also euthyroid  Discussed with the patient the results of her ultrasound Explained to the patient that  the 2 nodules meeting criteria for biopsy are under 2 cm, 1 of them being mixed cystic and solid and the only feature increasing the risk score is shape taller than wide do not feel that there is any urgency in doing any biopsy at this time  Since she has a long-standing goiter with numerous nodules which subjectively has not increased in size this also makes it likely that her nodules are benign.  She is agreeable to just a follow-up ultrasound in about 8 months at the end of the year to reevaluate the nodules  Also explained to her that usually thyroid function is not affected by multinodular goiters and does not need any follow-up on her TSH until at least next year   A copy of the consultation note is sent to the referring physician  Elayne Snare 06/02/2017

## 2017-06-02 ENCOUNTER — Encounter

## 2017-06-02 ENCOUNTER — Encounter: Payer: Self-pay | Admitting: Endocrinology

## 2017-06-02 ENCOUNTER — Ambulatory Visit: Payer: BLUE CROSS/BLUE SHIELD | Admitting: Endocrinology

## 2017-06-02 VITALS — BP 118/78 | HR 71 | Ht 65.0 in | Wt 147.2 lb

## 2017-06-02 DIAGNOSIS — E042 Nontoxic multinodular goiter: Secondary | ICD-10-CM | POA: Diagnosis not present

## 2017-06-04 DIAGNOSIS — H5213 Myopia, bilateral: Secondary | ICD-10-CM | POA: Diagnosis not present

## 2017-06-25 ENCOUNTER — Encounter: Payer: BLUE CROSS/BLUE SHIELD | Admitting: Adult Health

## 2017-07-07 ENCOUNTER — Ambulatory Visit (INDEPENDENT_AMBULATORY_CARE_PROVIDER_SITE_OTHER): Payer: BLUE CROSS/BLUE SHIELD

## 2017-07-07 ENCOUNTER — Ambulatory Visit: Payer: BLUE CROSS/BLUE SHIELD | Admitting: Podiatry

## 2017-07-07 ENCOUNTER — Other Ambulatory Visit: Payer: Self-pay | Admitting: Podiatry

## 2017-07-07 ENCOUNTER — Encounter: Payer: Self-pay | Admitting: Podiatry

## 2017-07-07 VITALS — BP 128/84 | HR 68

## 2017-07-07 DIAGNOSIS — M778 Other enthesopathies, not elsewhere classified: Secondary | ICD-10-CM

## 2017-07-07 DIAGNOSIS — M779 Enthesopathy, unspecified: Secondary | ICD-10-CM

## 2017-07-07 DIAGNOSIS — M79672 Pain in left foot: Secondary | ICD-10-CM

## 2017-07-07 MED ORDER — MELOXICAM 15 MG PO TABS: 15.0000 mg | ORAL_TABLET | Freq: Every day | ORAL | 3 refills | Status: DC

## 2017-07-07 NOTE — Progress Notes (Signed)
Subjective:  Patient ID: Olivia [name], female    DOB: 14-Jan-1961,  MRN: [id number] HPI Chief Complaint  Patient presents with  . Foot Pain    left foot lateral side; pt stated, "foot just started hurting yesterday morning; last night pain was so bad it woke me out of my sleep; pain is more aggravating than sharp; do not recall any injury"    57 y.o. female presents with the above complaint.   Denies fever chills nausea vomiting muscle aches pains calf pain back pain chest pain shortness of breath.  Past Medical History:  Diagnosis Date  . Breast cancer ([place]) 2018   Lumpectomy, chemotherapy and radiation with recurrence 2018. Now on Femara  . Family history of breast cancer   . Genetic testing 10/30/2016   Breast/GYN panel (23 genes) @ [facility] - No pathogenic mutations detected  . GERD (gastroesophageal reflux disease)    OTC medication  . Heart murmur    as a child - Echo in early 20s "ok"  . History of echocardiogram 05/2015   Echo 4/17: EF 55-60%, GLS -19.6%, normal wall motion  . History of exercise stress test 05/2015   ETT 4/17 - No ST changes  . HLD (hyperlipidemia)   . Multiple thyroid nodules 2009   Multiple nodules: Normal ultrasound reported at diagnosis  . Osteoporosis   . Personal history of chemotherapy   . Personal history of radiation therapy    Past Surgical History:  Procedure Laterality Date  . BREAST LUMPECTOMY Right 2003  . BREAST RECONSTRUCTION WITH PLACEMENT OF TISSUE EXPANDER AND FLEX HD (ACELLULAR HYDRATED DERMIS) Bilateral 11/17/2016   Procedure: IMEDITATE BREAST RECONSTRUCTION WITH PLACEMENT OF TISSUE EXPANDER AND FLEX HD (ACELLULAR HYDRATED DERMIS);  Surgeon: Wallace Going, DO;  Location: [place];  Service: Plastics;  Laterality: Bilateral;  . BREAST SURGERY  2003 /right   lumpectomy; chemotherapy and radiation  . REMOVAL OF BILATERAL TISSUE EXPANDERS WITH PLACEMENT OF BILATERAL BREAST IMPLANTS Bilateral 02/11/2017   Procedure: REMOVAL OF  BILATERAL TISSUE EXPANDERS WITH PLACEMENT OF BILATERAL SILICONE BREAST IMPLANTS;  Surgeon: Wallace Going, DO;  Location: [place];  Service: Plastics;  Laterality: Bilateral;  . TOTAL ABDOMINAL HYSTERECTOMY W/ BILATERAL SALPINGOOPHORECTOMY     for treatment of breast treatment  . TOTAL MASTECTOMY Bilateral 11/17/2016   Procedure: RIGHT MASTECTOMY AND LEFT PROPHYLATIC MASTECTOMY;  Surgeon: Jovita Kussmaul, MD;  Location: [place];  Service: General;  Laterality: Bilateral;    Current Outpatient Medications:  .  Calcium 500 MG CHEW, Chew by mouth., Disp: , Rfl:  .  denosumab (PROLIA) 60 MG/ML SOLN injection, Inject 60 mg into the skin every 6 (six) months. Administer in upper arm, thigh, or abdomen, Disp: 1 Syringe, Rfl: 1 .  letrozole (FEMARA) 2.5 MG tablet, Take 1 tablet (2.5 mg total) by mouth daily., Disp: 90 tablet, Rfl: 3 .  meloxicam (MOBIC) 15 MG tablet, Take 1 tablet (15 mg total) by mouth daily., Disp: 30 tablet, Rfl: 3  No Known Allergies Review of Systems Objective:   Vitals:   07/07/17 1123  BP: 128/84  Pulse: 68    General: Well developed, nourished, in no acute distress, alert and oriented x3   Dermatological: Skin is warm, dry and supple bilateral. Nails x 10 are well maintained; remaining integument appears unremarkable at this time. There are no open sores, no preulcerative lesions, no rash or signs of infection present.  Vascular: Dorsalis Pedis artery and Posterior Tibial artery pedal pulses are 2/4  bilateral with immedate capillary fill time. Pedal hair growth present. No varicosities and no lower extremity edema present bilateral.   Neruologic: Grossly intact via light touch bilateral. Vibratory intact via tuning fork bilateral. Protective threshold with Semmes Wienstein monofilament intact to all pedal sites bilateral. Patellar and Achilles deep tendon reflexes 2+ bilateral. No Babinski or clonus noted bilateral.   Musculoskeletal: No gross  boney pedal deformities bilateral. No pain, crepitus, or limitation noted with foot and ankle range of motion bilateral. Muscular strength 5/5 in all groups tested bilateral.  Gait: Unassisted, Nonantalgic.    Radiographs:  Radiographs taken today 4 views demonstrate what appears to be early osteoarthritic changes of the fourth fifth tarsometatarsal articulation.  Assessment & Plan:   Assessment: Capsulitis osteoarthritis left foot.  Plan: After sterile Betadine skin prep I injected 20 mg Kenalog 5 mg Marcaine point maximal tenderness to the tarsometatarsal articulation fourth and fifth left.  Tolerated procedure well start her on meloxicam.  15 mg 1 tablet p.o. daily.  Follow-up with her in 1 month if necessary.     Max T. Marvin, [place]

## 2017-07-09 ENCOUNTER — Inpatient Hospital Stay: Payer: BLUE CROSS/BLUE SHIELD | Attending: Adult Health | Admitting: Adult Health

## 2017-07-09 ENCOUNTER — Telehealth: Payer: Self-pay | Admitting: Adult Health

## 2017-07-09 ENCOUNTER — Encounter: Payer: Self-pay | Admitting: Adult Health

## 2017-07-09 VITALS — BP 141/87 | HR 67 | Temp 97.8°F | Resp 18 | Ht 65.0 in | Wt 146.7 lb

## 2017-07-09 DIAGNOSIS — Z170 Estrogen receptor positive status [ER+]: Secondary | ICD-10-CM | POA: Diagnosis not present

## 2017-07-09 DIAGNOSIS — C50211 Malignant neoplasm of upper-inner quadrant of right female breast: Secondary | ICD-10-CM | POA: Insufficient documentation

## 2017-07-09 DIAGNOSIS — Z9221 Personal history of antineoplastic chemotherapy: Secondary | ICD-10-CM | POA: Diagnosis not present

## 2017-07-09 DIAGNOSIS — Z79811 Long term (current) use of aromatase inhibitors: Secondary | ICD-10-CM | POA: Insufficient documentation

## 2017-07-09 DIAGNOSIS — Z90710 Acquired absence of both cervix and uterus: Secondary | ICD-10-CM | POA: Diagnosis not present

## 2017-07-09 DIAGNOSIS — Z923 Personal history of irradiation: Secondary | ICD-10-CM | POA: Diagnosis not present

## 2017-07-09 DIAGNOSIS — Z9013 Acquired absence of bilateral breasts and nipples: Secondary | ICD-10-CM | POA: Insufficient documentation

## 2017-07-09 NOTE — Progress Notes (Signed)
CLINIC:  Survivorship   REASON FOR VISIT:  Routine follow-up post-treatment for a recent history of breast cancer.  BRIEF ONCOLOGIC HISTORY:  Oncology History   Cancer Staging Malignant neoplasm of upper-inner quadrant of right breast in female, estrogen receptor positive ([place]) Staging form: Breast, AJCC 8th Edition - Clinical stage from 10/09/2016: Stage IA (cT1c, cN0, cM0, G3, ER: Positive, PR: Positive, HER2: Negative) - Signed by Truitt Merle, MD on 10/14/2016 - Pathologic stage from 11/17/2016: Stage IB (pT2, pN0, cM0, G3, ER: Positive, PR: Positive, HER2: Negative, Oncotype DX score: 21) - Signed by Truitt Merle, MD on 12/11/2016       Malignant neoplasm of upper-inner quadrant of right breast in female, estrogen receptor positive ([place])   10/08/2016 Mammogram    Diagnostic mammogram and ultrasound showed an irregular mass in the right breast at 1 o'clock, 5 cm from the nipple measuring 1.6 x 1.4x 1.6 cm, axilla was negative for adenopathy.      10/09/2016 Receptors her2    Estrogen Receptor: 80%, POSITIVE, STRONG STAINING INTENSITY Progesterone Receptor: 30%, POSITIVE, STRONG STAINING INTENSITY Proliferation Marker Ki67: 35% HER2 NEGATIVE       10/09/2016 Initial Diagnosis    Malignant neoplasm of upper-inner quadrant of right breast in female, estrogen receptor positive ([place])      10/09/2016 Initial Biopsy    Diagnosis Breast, right, needle core biopsy, upper inner quadrant, 1:00 o'clock - INVASIVE DUCTAL CARCINOMA, G3      10/2016 Genetic Testing    Genetic testing was negative for The genes analyzed were the 23 genes on [facility]'s Breast/GYN panel (ATM, BARD1, BRCA1, BRCA2, BRIP1, CDH1, CHEK2, DICER1, EPCAM, MLH1,  MSH2, MSH6, NBN, NF1, PALB2, PMS2, PTEN, RAD50, RAD51C, RAD51D,SMARCA4, STK11, and TP53).        11/17/2016 Surgery    RIGHT MASTECTOMY AND LEFT PROPHYLATIC MASTECTOMY by Dr. Marlou Starks  IMEDITATE BREAST RECONSTRUCTION WITH PLACEMENT OF TISSUE EXPANDER AND FLEX HD  (ACELLULAR HYDRATED DERMIS) by Dr. Marla Roe        11/17/2016 Pathology Results    Diagnosis 11/17/16 1. Breast, simple mastectomy, Left - BENIGN BREAST TISSUE WITH ECTATIC DUCTS. - ONE OF ONE LYMPH NODE NEGATIVE FOR CARCINOMA (0/1). 2. Breast, simple mastectomy, Right - INVASIVE DUCTAL CARCINOMA, GRADE 3, SPANNING 2.4 CM. - HIGH GRADE DUCTAL CARCINOMA IN SITU. - INVASIVE CARCINOMA IS <0.1 CM OF THE POSTERIOR MARGIN (BROADLY) AND ANTERIOR MARGIN (FOCALLY). - IN SITU CARCINOMA IS >0.2 CM OF BOTH MARGINS. - PRIOR LUMPECTOMY SITE, NEGATIVE FOR CARCINOMA. - SEE ONCOLOGY TABLE. 3. Breast, excision, Superior flap deep margin - BENIGN FIBROADIPOSE TISSUE.      11/17/2016 Miscellaneous    Oncotype on 11/17/16 Rcurence Risk Score of 21 10-Year risk of distant reucrrence with Tamoxifen alone is 13%       12/18/2016 -  Anti-estrogen oral therapy    Letrozole 2.5 mg daily; began 12/18/16      12/24/2016 Imaging    BONE SCAN IMPRESSION: No evidence of osseous metastatic disease.      12/24/2016 Imaging    CT CAP IMPRESSION: 1. Status post bilateral modified radical mastectomy and breast reconstruction, as above. No findings to suggest metastatic disease in the chest, abdomen or pelvis. 2. Multiple heterogeneous appearing thyroid nodules, as discussed above. These are nonspecific but further evaluation with nonemergent thyroid ultrasound is recommended in the near future to determine any potential need for further evaluation with fine-needle aspiration. 3. Incidental findings, as above.      02/11/2017 Surgery    REMOVAL  OF BILATERAL TISSUE EXPANDERS WITH PLACEMENT OF BILATERAL SILICONE BREAST IMPLANTS by Dr. Marla Roe        INTERVAL HISTORY:  Olivia [name] presents to the [facility] today for our initial meeting to review her survivorship care plan detailing her treatment course for breast cancer, as well as monitoring long-term side effects of that treatment,  education regarding health maintenance, screening, and overall wellness and health promotion.     Overall, Olivia [name] reports feeling quite well.  She is taking Letrozole daily.  She has noticed some weight gain, some joint pain, and some fatigue.  She says it wasn't as bad as the side effects she noted with Anastrozole 15 years ago.      REVIEW OF SYSTEMS:  Review of Systems  Constitutional: Negative for appetite change, chills, fatigue, fever and unexpected weight change.  HENT:   Negative for hearing loss, lump/mass and trouble swallowing.   Eyes: Negative for eye problems and icterus.  Respiratory: Negative for chest tightness, cough and shortness of breath.   Cardiovascular: Negative for chest pain, leg swelling and palpitations.  Gastrointestinal: Negative for abdominal distention, abdominal pain, constipation, diarrhea, nausea and vomiting.  Endocrine: Negative for hot flashes.  Skin: Negative for itching and rash.  Neurological: Negative for dizziness, extremity weakness, headaches and numbness.  Hematological: Negative for adenopathy. Does not bruise/bleed easily.  Psychiatric/Behavioral: Negative for depression. The patient is not nervous/anxious.   Breast: Denies any new nodularity, masses, tenderness, nipple changes, or nipple discharge.      ONCOLOGY TREATMENT TEAM:  1. Surgeon:  Dr. [facility]ou Starks at [facility] 2. Medical Oncologist: Dr. Burr Medico 3. Radiation Oncologist: Dr. Sondra Come 4. Plastic Surgeon: Dr. Marla Roe    PAST MEDICAL/SURGICAL HISTORY:  Past Medical History:  Diagnosis Date  . Breast cancer ([place]) 2018   Lumpectomy, chemotherapy and radiation with recurrence 2018. Now on Femara  . Family history of breast cancer   . Genetic testing 10/30/2016   Breast/GYN panel (23 genes) @ [facility] - No pathogenic mutations detected  . GERD (gastroesophageal reflux disease)    OTC medication  . Heart murmur    as a child - Echo in early 20s "ok"  . History of  echocardiogram 05/2015   Echo 4/17: EF 55-60%, GLS -19.6%, normal wall motion  . History of exercise stress test 05/2015   ETT 4/17 - No ST changes  . HLD (hyperlipidemia)   . Multiple thyroid nodules 2009   Multiple nodules: Normal ultrasound reported at diagnosis  . Osteoporosis   . Personal history of chemotherapy   . Personal history of radiation therapy    Past Surgical History:  Procedure Laterality Date  . BREAST LUMPECTOMY Right 2003  . BREAST RECONSTRUCTION WITH PLACEMENT OF TISSUE EXPANDER AND FLEX HD (ACELLULAR HYDRATED DERMIS) Bilateral 11/17/2016   Procedure: IMEDITATE BREAST RECONSTRUCTION WITH PLACEMENT OF TISSUE EXPANDER AND FLEX HD (ACELLULAR HYDRATED DERMIS);  Surgeon: Wallace Going, DO;  Location: [place];  Service: Plastics;  Laterality: Bilateral;  . BREAST SURGERY  2003 /right   lumpectomy; chemotherapy and radiation  . REMOVAL OF BILATERAL TISSUE EXPANDERS WITH PLACEMENT OF BILATERAL BREAST IMPLANTS Bilateral 02/11/2017   Procedure: REMOVAL OF BILATERAL TISSUE EXPANDERS WITH PLACEMENT OF BILATERAL SILICONE BREAST IMPLANTS;  Surgeon: Wallace Going, DO;  Location: [place];  Service: Plastics;  Laterality: Bilateral;  . TOTAL ABDOMINAL HYSTERECTOMY W/ BILATERAL SALPINGOOPHORECTOMY     for treatment of breast treatment  . TOTAL MASTECTOMY Bilateral 11/17/2016   Procedure: RIGHT MASTECTOMY  AND LEFT PROPHYLATIC MASTECTOMY;  Surgeon: Jovita Kussmaul, MD;  Location: [place];  Service: General;  Laterality: Bilateral;     ALLERGIES:  No Known Allergies   CURRENT MEDICATIONS:  Outpatient Encounter Medications as of 07/09/2017  Medication Sig  . Calcium 500 MG CHEW Chew by mouth.  . denosumab (PROLIA) 60 MG/ML SOLN injection Inject 60 mg into the skin every 6 (six) months. Administer in upper arm, thigh, or abdomen  . letrozole (FEMARA) 2.5 MG tablet Take 1 tablet (2.5 mg total) by mouth daily.  . meloxicam (MOBIC) 15 MG tablet Take 1 tablet (15  mg total) by mouth daily.   No facility-administered encounter medications on file as of 07/09/2017.      ONCOLOGIC FAMILY HISTORY:  Family History  Problem Relation Age of Onset  . Diabetes Other        1ST DEGREE RELATIVE  . Coronary artery disease Mother        1ST DEGREE RELATIVE  . Heart attack Mother 71  . Skin cancer Mother   . Diabetes Father   . Kidney disease Father   . Prostate cancer Father 7       deceased 38  . Cancer Brother 23       multiple myeloma   . Lung cancer Maternal Uncle        in 2 mat uncles  . Breast cancer Paternal Aunt 61  . Breast cancer Cousin        dx 57s; daughter of unaffected paternal aunt  . Breast cancer Cousin        dx 81s; another daughter of unaffected paternal aunt  . Esophageal cancer Cousin        pat first cousin     GENETIC COUNSELING/TESTING: negative  SOCIAL HISTORY:  Social History   Socioeconomic History  . Marital status: Married    Spouse name: Not on file  . Number of children: Not on file  . Years of education: Not on file  . Highest education level: Not on file  Occupational History  . Not on file  Social Needs  . Financial resource strain: Not on file  . Food insecurity:    Worry: Not on file    Inability: Not on file  . Transportation needs:    Medical: Not on file    Non-medical: Not on file  Tobacco Use  . Smoking status: Never Smoker  . Smokeless tobacco: Never Used  Substance and Sexual Activity  . Alcohol use: Yes    Alcohol/week: 0.0 oz    Comment: Rare  . Drug use: No  . Sexual activity: Yes    Birth control/protection: Surgical  Lifestyle  . Physical activity:    Days per week: Not on file    Minutes per session: Not on file  . Stress: Not on file  Relationships  . Social connections:    Talks on phone: Not on file    Gets together: Not on file    Attends religious service: Not on file    Active member of club or organization: Not on file    Attends meetings of clubs or  organizations: Not on file    Relationship status: Not on file  . Intimate partner violence:    Fear of current or ex partner: Not on file    Emotionally abused: Not on file    Physically abused: Not on file    Forced sexual activity: Not on file  Other Topics Concern  .  Not on file  Social History Narrative   Married. Bobby.   Children: none. No pets.    Stay at home.    Wears her seatbelt. Smoke alarm in the home.    Feels safe in her relationships.    Exercise 5x week.       PHYSICAL EXAMINATION:  Vital Signs:   Vitals:   07/09/17 1031  BP: (!) 141/87  Pulse: 67  Resp: 18  Temp: 97.8 F (36.6 C)  SpO2: 100%   Filed Weights   07/09/17 1031  Weight: 146 lb 11.2 oz (66.5 kg)   General: Well-nourished, well-appearing female in no acute distress.  She is unaccompanied today.   HEENT: Head is normocephalic.  Pupils equal and reactive to light. Conjunctivae clear without exudate.  Sclerae anicteric. Oral mucosa is pink, moist.  Oropharynx is pink without lesions or erythema.  Lymph: No cervical, supraclavicular, or infraclavicular lymphadenopathy noted on palpation.  Cardiovascular: Regular rate and rhythm.Marland Kitchen Respiratory: Clear to auscultation bilaterally. Chest expansion symmetric; breathing non-labored.  GI: Abdomen soft and round; non-tender, non-distended. Bowel sounds normoactive.  GU: Deferred.  Neuro: No focal deficits. Steady gait.  Psych: Mood and affect normal and appropriate for situation.  Extremities: No edema. MSK: No focal spinal tenderness to palpation.  Full range of motion in bilateral upper extremities Skin: Warm and dry.  LABORATORY DATA:  None for this visit.  DIAGNOSTIC IMAGING:  None for this visit.      ASSESSMENT AND PLAN:  Olivia [name] is a pleasant 57 y.o. female with Stage IB right breast invasive ductal carcinoma, ER+/PR+/HER2-, diagnosed in 10/2016, treated with bilateral mastectomies and reconstruction, and anti-estrogen therapy with  Letrozole beginning in 12/18/2016.  She presents to the Survivorship Clinic for our initial meeting and routine follow-up post-completion of treatment for breast cancer.    1. Stage IB right breast cancer:  Olivia [name] is continuing to recover from definitive treatment for breast cancer. She will follow-up with her medical oncologist, Dr. Burr Medico in three months with history and physical exam per surveillance protocol.  She will continue her anti-estrogen therapy with Letrozole. Thus far, she is tolerating the Letrozole well, with minimal side effects.  We reviewed lymphedema prevention today and she does fly [place] a couple of times per year. I wrote for a class one lymphedema sleeve and glove for her today.  Today, a comprehensive survivorship care plan and treatment summary was reviewed with the patient today detailing her breast cancer diagnosis, treatment course, potential late/long-term effects of treatment, appropriate follow-up care with recommendations for the future, and patient education resources.  A copy of this summary, along with a letter will be sent to the patient's primary care provider via mail/fax/In Basket message after today's visit.    2. Bone health:  Given Olivia [name]'s age/history of breast cancer and her current treatment regimen including anti-estrogen therapy with Letrozole, and diagnosis of osteoporosis, she is at risk for further bone demineralization.  Her last DEXA scan was 02/02/2017 which demonstrated osteopenia with a T score of -2.0 in the right femur.  She receives Prolia every 6 months with her PCP and will continue this. She was given education on specific activities to promote bone health.  3. Cancer screening:  Due to Olivia [name]'s history and her age, she should receive screening for skin cancers, colon cancer, and gynecologic cancers.  The information and recommendations are listed on the patient's comprehensive care plan/treatment summary and were reviewed in detail  with the patient.  4. Health maintenance and wellness promotion: Olivia [name] was encouraged to consume 5-7 servings of fruits and vegetables per day. We reviewed the "Nutrition Rainbow" handout, as well as the handout "Take Control of Your Health and Reduce Your Cancer Risk" from the [place].  She was also encouraged to engage in moderate to vigorous exercise for 30 minutes per day most days of the week. We discussed the [employer] fitness program, which is designed for cancer survivors to help them become more physically fit after cancer treatments.  She was instructed to limit her alcohol consumption and continue to abstain from tobacco use.     5. Support services/counseling: It is not uncommon for this period of the patient's cancer care trajectory to be one of many emotions and stressors.  We discussed an opportunity for her to participate in the next session of [facility] ("Finding Your New Normal") support group series designed for patients after they have completed treatment.   Olivia [name] was encouraged to take advantage of our many other support services programs, support groups, and/or counseling in coping with her new life as a cancer survivor after completing anti-cancer treatment.  She was given information regarding our available services and encouraged to contact me with any questions or for help enrolling in any of our support group/programs.    Dispo:   -Return to cancer center in 3 months for f/u with Dr. Burr Medico  -Bone density in 01/2019 -Follow up with Dr. Marlou Starks when due -She is welcome to return back to the Survivorship Clinic at any time; no additional follow-up needed at this time.  -Consider referral back to survivorship as a long-term survivor for continued surveillance  A total of (30) minutes of face-to-face time was spent with this patient with greater than 50% of that time in counseling and care-coordination.   Gardenia Phlegm, NP Survivorship  Program [facility] [phone]   Note: PRIMARY CARE PROVIDER Ma [place], [place] [phone]

## 2017-07-09 NOTE — Telephone Encounter (Signed)
Per 6/6 no los 

## 2017-07-14 DIAGNOSIS — Z6824 Body mass index (BMI) 24.0-24.9, adult: Secondary | ICD-10-CM | POA: Diagnosis not present

## 2017-07-14 DIAGNOSIS — Z01419 Encounter for gynecological examination (general) (routine) without abnormal findings: Secondary | ICD-10-CM | POA: Diagnosis not present

## 2017-07-21 DIAGNOSIS — C50912 Malignant neoplasm of unspecified site of left female breast: Secondary | ICD-10-CM | POA: Diagnosis not present

## 2017-07-21 DIAGNOSIS — C50911 Malignant neoplasm of unspecified site of right female breast: Secondary | ICD-10-CM | POA: Diagnosis not present

## 2017-07-29 DIAGNOSIS — C50919 Malignant neoplasm of unspecified site of unspecified female breast: Secondary | ICD-10-CM | POA: Diagnosis not present

## 2017-08-05 ENCOUNTER — Telehealth: Payer: Self-pay

## 2017-08-05 NOTE — Telephone Encounter (Signed)
Faxed signed order to [facility] at [phone]

## 2017-08-27 DIAGNOSIS — C50919 Malignant neoplasm of unspecified site of unspecified female breast: Secondary | ICD-10-CM | POA: Diagnosis not present

## 2017-09-28 DIAGNOSIS — L309 Dermatitis, unspecified: Secondary | ICD-10-CM | POA: Diagnosis not present

## 2017-09-30 ENCOUNTER — Ambulatory Visit (INDEPENDENT_AMBULATORY_CARE_PROVIDER_SITE_OTHER): Payer: BLUE CROSS/BLUE SHIELD

## 2017-09-30 DIAGNOSIS — M810 Age-related osteoporosis without current pathological fracture: Secondary | ICD-10-CM | POA: Diagnosis not present

## 2017-09-30 MED ORDER — DENOSUMAB 60 MG/ML ~~LOC~~ SOSY
60.0000 mg | PREFILLED_SYRINGE | Freq: Once | SUBCUTANEOUS | Status: AC
  Administered 2017-09-30: 60 mg via SUBCUTANEOUS

## 2017-09-30 NOTE — Progress Notes (Addendum)
Olivia [name] is a 57 y.o. female presents to the office today for Prolia injections, per physician's orders. Original order: 04/02/17 Prolia, 60mg , SQ was administered Left arm today. Patient tolerated injection. Patient due for follow up labs/provider appt: No. Date due: , appt made No Patient next injection due: 03/02/2018, appt made Yes  [place] screening examination/treatment/procedure(s) were performed by non-physician practitioner and as supervising physician I was immediately available for consultation/collaboration.  I agree with above assessment and plan.  Electronically Signed by: Howard Pouch, DO [facility]

## 2017-10-13 DIAGNOSIS — Z170 Estrogen receptor positive status [ER+]: Secondary | ICD-10-CM | POA: Diagnosis not present

## 2017-10-13 DIAGNOSIS — C50211 Malignant neoplasm of upper-inner quadrant of right female breast: Secondary | ICD-10-CM | POA: Diagnosis not present

## 2017-10-15 ENCOUNTER — Telehealth: Payer: Self-pay | Admitting: Hematology

## 2017-10-15 NOTE — Telephone Encounter (Signed)
Correction. Patient was given appointments for 10/16. Schedule mailed.

## 2017-10-15 NOTE — Telephone Encounter (Signed)
YF PAL 9/19. Moved appointments to 9/30. Spoke with patient.

## 2017-10-22 ENCOUNTER — Ambulatory Visit: Payer: BLUE CROSS/BLUE SHIELD | Admitting: Hematology

## 2017-10-22 ENCOUNTER — Other Ambulatory Visit: Payer: BLUE CROSS/BLUE SHIELD

## 2017-10-27 ENCOUNTER — Other Ambulatory Visit: Payer: Self-pay | Admitting: Podiatry

## 2017-11-17 DIAGNOSIS — L814 Other melanin hyperpigmentation: Secondary | ICD-10-CM | POA: Diagnosis not present

## 2017-11-17 DIAGNOSIS — D225 Melanocytic nevi of trunk: Secondary | ICD-10-CM | POA: Diagnosis not present

## 2017-11-17 DIAGNOSIS — Z23 Encounter for immunization: Secondary | ICD-10-CM | POA: Diagnosis not present

## 2017-11-17 DIAGNOSIS — L821 Other seborrheic keratosis: Secondary | ICD-10-CM | POA: Diagnosis not present

## 2017-11-18 ENCOUNTER — Other Ambulatory Visit: Payer: Self-pay | Admitting: Podiatry

## 2017-11-18 ENCOUNTER — Inpatient Hospital Stay: Payer: BLUE CROSS/BLUE SHIELD

## 2017-11-18 ENCOUNTER — Inpatient Hospital Stay: Payer: BLUE CROSS/BLUE SHIELD | Admitting: Hematology

## 2017-11-24 NOTE — Progress Notes (Signed)
[facility]  [phone] [phone]  Clinic Follow Up Note   Patient Care Team: Ma Hillock, DO as PCP - General (Family Medicine) Truitt Merle, MD as Consulting Physician (Hematology) Jovita Kussmaul, MD as Consulting Physician (General Surgery) Gery Pray, MD as Consulting Physician (Radiation Oncology) Sable Feil, MD as Consulting Physician (Gastroenterology) Molli Posey, MD as Consulting Physician (Obstetrics and Gynecology) Gardenia Phlegm, NP as Nurse Practitioner (Hematology and Oncology)   Date of Service:  11/26/2017  CHIEF COMPLAINTS:  Follow up for Malignant neoplasm of upper-inner quadrant of right breast  Oncology History   Cancer Staging Malignant neoplasm of upper-inner quadrant of right breast in female, estrogen receptor positive ([place]) Staging form: Breast, AJCC 8th Edition - Clinical stage from 10/09/2016: Stage IA (cT1c, cN0, cM0, G3, ER: Positive, PR: Positive, HER2: Negative) - Signed by Truitt Merle, MD on 10/14/2016 - Pathologic stage from 11/17/2016: Stage IB (pT2, pN0, cM0, G3, ER: Positive, PR: Positive, HER2: Negative, Oncotype DX score: 21) - Signed by Truitt Merle, MD on 12/11/2016       Malignant neoplasm of upper-inner quadrant of right breast in female, estrogen receptor positive ([place])   10/08/2016 Mammogram    Diagnostic mammogram and ultrasound showed an irregular mass in the right breast at 1 o'clock, 5 cm from the nipple measuring 1.6 x 1.4x 1.6 cm, axilla was negative for adenopathy.    10/09/2016 Receptors her2    Estrogen Receptor: 80%, POSITIVE, STRONG STAINING INTENSITY Progesterone Receptor: 30%, POSITIVE, STRONG STAINING INTENSITY Proliferation Marker Ki67: 35% HER2 NEGATIVE     10/09/2016 Initial Diagnosis    Malignant neoplasm of upper-inner quadrant of right breast in female, estrogen receptor positive ([place])    10/09/2016 Initial Biopsy    Diagnosis Breast, right, needle core  biopsy, upper inner quadrant, 1:00 o'clock - INVASIVE DUCTAL CARCINOMA, G3    10/2016 Genetic Testing    Genetic testing was negative for The genes analyzed were the 23 genes on [facility]'s Breast/GYN panel (ATM, BARD1, BRCA1, BRCA2, BRIP1, CDH1, CHEK2, DICER1, EPCAM, MLH1,  MSH2, MSH6, NBN, NF1, PALB2, PMS2, PTEN, RAD50, RAD51C, RAD51D,SMARCA4, STK11, and TP53).      11/17/2016 Surgery    RIGHT MASTECTOMY AND LEFT PROPHYLATIC MASTECTOMY by Dr. Marlou Starks  IMEDITATE BREAST RECONSTRUCTION WITH PLACEMENT OF TISSUE EXPANDER AND FLEX HD (ACELLULAR HYDRATED DERMIS) by Dr. Marla Roe      11/17/2016 Pathology Results    Diagnosis 11/17/16 1. Breast, simple mastectomy, Left - BENIGN BREAST TISSUE WITH ECTATIC DUCTS. - ONE OF ONE LYMPH NODE NEGATIVE FOR CARCINOMA (0/1). 2. Breast, simple mastectomy, Right - INVASIVE DUCTAL CARCINOMA, GRADE 3, SPANNING 2.4 CM. - HIGH GRADE DUCTAL CARCINOMA IN SITU. - INVASIVE CARCINOMA IS <0.1 CM OF THE POSTERIOR MARGIN (BROADLY) AND ANTERIOR MARGIN (FOCALLY). - IN SITU CARCINOMA IS >0.2 CM OF BOTH MARGINS. - PRIOR LUMPECTOMY SITE, NEGATIVE FOR CARCINOMA. - SEE ONCOLOGY TABLE. 3. Breast, excision, Superior flap deep margin - BENIGN FIBROADIPOSE TISSUE.    11/17/2016 Miscellaneous    Oncotype on 11/17/16 Rcurence Risk Score of 21 10-Year risk of distant reucrrence with Tamoxifen alone is 13%     12/18/2016 -  Anti-estrogen oral therapy    Letrozole 2.5 mg daily; began 12/18/16    12/24/2016 Imaging    BONE SCAN IMPRESSION: No evidence of osseous metastatic disease.    12/24/2016 Imaging    CT CAP IMPRESSION: 1. Status post bilateral modified radical mastectomy and breast reconstruction, as above. No findings to suggest metastatic disease  in the chest, abdomen or pelvis. 2. Multiple heterogeneous appearing thyroid nodules, as discussed above. These are nonspecific but further evaluation with nonemergent thyroid ultrasound is recommended in the near  future to determine any potential need for further evaluation with fine-needle aspiration. 3. Incidental findings, as above.    02/11/2017 Surgery    REMOVAL OF BILATERAL TISSUE EXPANDERS WITH PLACEMENT OF BILATERAL SILICONE BREAST IMPLANTS by Dr. Marla Roe      HISTORY OF PRESENTING ILLNESS: 10/15/16 Collier Flowers 57 y.o. female is here because of newly diagnosed Malignant neoplasm of upper-inner quadrant of right breast. She presents to the Breast Clinic with her mother and her sister.  This was found in her yearly exam. She did not notice any change in appetite, weight or skin or nipple appearance.   In the past she was diagnosed with Right Breast Cancer in 2003. She was treated by Dr. Truddie Coco. She had a lumpectomy (3/7 positive lymph nodes), chemo, radiation, tamoxifen for 2 years and Arimidex for 3 years.   She also had SBO in 2005. On Tamoxifen she has joint aches that got better with Arimidex.  She does have osteoporosis that she takes prolia twice a year and has been on for 2 years. She was previously on Fosamax. She has not had a fracture. She has goiters but does not take medication. Her father had prostate cancer and her brother has MM.   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    GYN HISTORY  Menarchal: 17 LMP: 2004 Contraceptive: No HRT: No GP: G0P0   CURRENT THERAPY:  1. Adjuvant Letrozole 2.5 mg daily; began 12/18/16 2. Prolia injections every 6 months since 2016 through her PCP    INTERVAL HISTORY:  Olivia [name] is here for a follow up for her right breast cancer. She was last seen by me 8 months ago. She attended survivorship clinic 4 months ago with NP Causey, and was noted to have had noticed weight gain, joint pain and fatigue with Letrozole.  Today, she is here alone at the clinic. She states that she is tolerating well, but has noticed weight gain. She is trying to stay active and  exercise. She started attending a gym and is currenlty doing water exercises.      MEDICAL HISTORY:  Past Medical History:  Diagnosis Date  . Breast cancer ([place]) 2018   Lumpectomy, chemotherapy and radiation with recurrence 2018. Now on Femara  . Family history of breast cancer   . Genetic testing 10/30/2016   Breast/GYN panel (23 genes) @ [facility] - No pathogenic mutations detected  . GERD (gastroesophageal reflux disease)    OTC medication  . Heart murmur    as a child - Echo in early 20s "ok"  . History of echocardiogram 05/2015   Echo 4/17: EF 55-60%, GLS -19.6%, normal wall motion  . History of exercise stress test 05/2015   ETT 4/17 - No ST changes  . HLD (hyperlipidemia)   . Multiple thyroid nodules 2009   Multiple nodules: Normal ultrasound reported at diagnosis  . Osteoporosis   . Personal history of chemotherapy   . Personal history of radiation therapy     SURGICAL HISTORY: Past Surgical History:  Procedure Laterality Date  . BREAST LUMPECTOMY Right 2003  . BREAST RECONSTRUCTION WITH PLACEMENT OF TISSUE EXPANDER AND FLEX HD (ACELLULAR HYDRATED DERMIS) Bilateral 11/17/2016   Procedure: IMEDITATE BREAST RECONSTRUCTION WITH PLACEMENT OF  TISSUE EXPANDER AND FLEX HD (ACELLULAR HYDRATED DERMIS);  Surgeon: Wallace Going, DO;  Location: [facility];  Service: Plastics;  Laterality: Bilateral;  . BREAST SURGERY  2003 /right   lumpectomy; chemotherapy and radiation  . REMOVAL OF BILATERAL TISSUE EXPANDERS WITH PLACEMENT OF BILATERAL BREAST IMPLANTS Bilateral 02/11/2017   Procedure: REMOVAL OF BILATERAL TISSUE EXPANDERS WITH PLACEMENT OF BILATERAL SILICONE BREAST IMPLANTS;  Surgeon: Wallace Going, DO;  Location: [facility];  Service: Plastics;  Laterality: Bilateral;  . TOTAL ABDOMINAL HYSTERECTOMY W/ BILATERAL SALPINGOOPHORECTOMY     for treatment of breast treatment  . TOTAL MASTECTOMY Bilateral 11/17/2016   Procedure: RIGHT MASTECTOMY AND LEFT  PROPHYLATIC MASTECTOMY;  Surgeon: Jovita Kussmaul, MD;  Location: [facility];  Service: General;  Laterality: Bilateral;    SOCIAL HISTORY: Social History   Socioeconomic History  . Marital status: Married    Spouse name: Not on file  . Number of children: Not on file  . Years of education: Not on file  . Highest education level: Not on file  Occupational History  . Not on file  Social Needs  . Financial resource strain: Not on file  . Food insecurity:    Worry: Not on file    Inability: Not on file  . Transportation needs:    Medical: Not on file    Non-medical: Not on file  Tobacco Use  . Smoking status: Never Smoker  . Smokeless tobacco: Never Used  Substance and Sexual Activity  . Alcohol use: Yes    Alcohol/week: 0.0 standard drinks    Comment: Rare  . Drug use: No  . Sexual activity: Yes    Birth control/protection: Surgical  Lifestyle  . Physical activity:    Days per week: Not on file    Minutes per session: Not on file  . Stress: Not on file  Relationships  . Social connections:    Talks on phone: Not on file    Gets together: Not on file    Attends religious service: Not on file    Active member of club or organization: Not on file    Attends meetings of clubs or organizations: Not on file    Relationship status: Not on file  . Intimate partner violence:    Fear of current or ex partner: Not on file    Emotionally abused: Not on file    Physically abused: Not on file    Forced sexual activity: Not on file  Other Topics Concern  . Not on file  Social History Narrative   Married. Bobby.   Children: none. No pets.    Stay at home.    Wears her seatbelt. Smoke alarm in the home.    Feels safe in her relationships.    Exercise 5x week.     FAMILY HISTORY: Family History  Problem Relation Age of Onset  . Diabetes Other        1ST DEGREE RELATIVE  . Coronary artery disease Mother        1ST DEGREE RELATIVE  . Heart attack Mother 21  . Skin cancer  Mother   . Diabetes Father   . Kidney disease Father   . Prostate cancer Father 63       deceased 41  . Cancer Brother 20       multiple myeloma   . Lung cancer Maternal Uncle        in 2 mat uncles  . Breast cancer Paternal Aunt 35  .  Breast cancer Cousin        dx 40s; daughter of unaffected paternal aunt  . Breast cancer Cousin        dx 65s; another daughter of unaffected paternal aunt  . Esophageal cancer Cousin        pat first cousin    ALLERGIES:  has No Known Allergies.  MEDICATIONS:  Current Outpatient Medications  Medication Sig Dispense Refill  . Calcium 500 MG CHEW Chew by mouth.    . denosumab (PROLIA) 60 MG/ML SOLN injection Inject 60 mg into the skin every 6 (six) months. Administer in upper arm, thigh, or abdomen 1 Syringe 1  . letrozole (FEMARA) 2.5 MG tablet Take 1 tablet (2.5 mg total) by mouth daily. 90 tablet 3  . meloxicam (MOBIC) 15 MG tablet TAKE 1 TABLET BY MOUTH EVERY DAY 30 tablet 0   No current facility-administered medications for this visit.     REVIEW OF SYSTEMS:   Constitutional: Denies fevers, chills or abnormal night sweats (+) weight gain Eyes: Denies blurriness of vision, double vision or watery eyes Ears, nose, mouth, throat, and face: Denies mucositis or sore throat Respiratory: Denies cough, dyspnea or wheezes Cardiovascular: Denies palpitation, chest discomfort or lower extremity swelling Gastrointestinal:  Denies nausea, heartburn or change in bowel habits Skin: Denies abnormal skin rashes Lymphatics: Denies new lymphadenopathy or easy bruising Neurological:Denies numbness, tingling or new weaknesses Behavioral/Psych: Mood is stable, no new changes  All other systems were reviewed with the patient and are negative.   PHYSICAL EXAMINATION: ECOG PERFORMANCE STATUS: 0 - Asymptomatic  Vitals:   11/26/17 1115  BP: (!) 148/96  Pulse: 74  Resp: 18  Temp: 97.9 F (36.6 C)  SpO2: 98%   Filed Weights   11/26/17 1115  Weight:  149 lb 6.4 oz (67.8 kg)    GENERAL:alert, no distress and comfortable SKIN: skin color, texture, turgor are normal, no rashes or significant lesions EYES: normal, conjunctiva are pink and non-injected, sclera clear OROPHARYNX:no exudate, no erythema and lips, buccal mucosa, and tongue normal  NECK: supple, thyroid normal size, non-tender, without nodularity LYMPH:  no palpable lymphadenopathy in the cervical, axillary or inguinal LUNGS: clear to auscultation and percussion with normal breathing effort HEART: regular rate & rhythm and no murmurs and no lower extremity edema ABDOMEN:abdomen soft, non-tender and normal bowel sounds Musculoskeletal:no cyanosis of digits and no clubbing (+) mild restriction of arm flexion PSYCH: alert & oriented x 3 with fluent speech NEURO: no focal motor/sensory deficits Breast: (+) s/p b/l breast mastectomy and reconstruction: surgical scars healed well, mild breast tenderness and numbness at surgical. No significant scar tissue. No palp mass or adenopathy. Overall benign exam.    LABORATORY DATA:  I have reviewed the data as listed CBC Latest Ref Rng & Units 11/26/2017 03/26/2017 01/22/2017  WBC 4.0 - 10.5 K/uL 4.9 4.2 4.9  Hemoglobin 12.0 - 15.0 g/dL 14.8 13.8 13.9  Hematocrit 36.0 - 46.0 % 44.2 41.1 41.7  Platelets 150 - 400 K/uL 250 218 252    CMP Latest Ref Rng & Units 11/26/2017 03/26/2017 01/22/2017  Glucose 70 - 99 mg/dL 86 89 84  BUN 6 - 20 mg/dL 16 16 18.0  Creatinine 0.44 - 1.00 mg/dL 1.04(H) 0.90 1.0  Sodium 135 - 145 mmol/L 140 142 141  Potassium 3.5 - 5.1 mmol/L 4.5 4.4 4.4  Chloride 98 - 111 mmol/L 104 106 -  CO2 22 - 32 mmol/L _0 Calcium 8.9 - 10.3 mg/dL 9.5 9.7  9.7  Total Protein 6.5 - 8.1 g/dL 7.5 7.1 7.5  Total Bilirubin 0.3 - 1.2 mg/dL 0.5 0.4 0.47  Alkaline Phos 38 - 126 U/L 55 54 58  AST 15 - 41 U/L 32 24 29  ALT 0 - 44 U/L 37 29 35   PATHOLOGY  Diagnosis10/15/18 1. Breast, simple mastectomy, Left - BENIGN  BREAST TISSUE WITH ECTATIC DUCTS. - ONE OF ONE LYMPH NODE NEGATIVE FOR CARCINOMA (0/1). 2. Breast, simple mastectomy, Right - INVASIVE DUCTAL CARCINOMA, GRADE 3, SPANNING 2.4 CM. - HIGH GRADE DUCTAL CARCINOMA IN SITU. - INVASIVE CARCINOMA IS <0.1 CM OF THE POSTERIOR MARGIN (BROADLY) AND ANTERIOR MARGIN (FOCALLY). - IN SITU CARCINOMA IS >0.2 CM OF BOTH MARGINS. - PRIOR LUMPECTOMY SITE, NEGATIVE FOR CARCINOMA. - SEE ONCOLOGY TABLE. 3. Breast, excision, Superior flap deep margin - BENIGN FIBROADIPOSE TISSUE. Microscopic Comment 2. BREAST, INVASIVE TUMOR Procedure: Right mastectomy. Laterality: Right. Tumor Size: 2.4 cm. Histologic Type: Invasive ductal carcinoma. Grade: 3 Tubular Differentiation: 3 Nuclear Pleomorphism: 2 Mitotic Count: 3 Ductal Carcinoma in Situ (DCIS): Present, high grade. Extent of Tumor: Tumor abuts skeletal muscle but does not invade it. Margins: Invasive carcinoma, distance from closest margin: <0.1 of posterior (broadly) and anterior (Focally). DCIS, distance from closest margin: >0.2 cm all margins. Regional Lymph Nodes: None. Breast Prognostic Profile: Performed on biopsy [id number], see below. Will not be repeated. Estrogen Receptor: Positive, 80% strong staining. Progesterone Receptor: Positive, 30% strong staining. Her2: Negative (ratio 1.14). Ki-67: 35% Best tumor block for sendout testing: 2B Pathologic Stage Classification (pTNM, AJCC 8th Edition): Primary Tumor (pT): pT2 Regional Lymph Nodes (pN): pN0 Distant Metastases (pM): pMX Comments: None.   Diagnosis 10/09/16 Breast, right, needle core biopsy, upper inner quadrant, 1:00 o'clock - INVASIVE DUCTAL CARCINOMA, SEE COMMENT. Microscopic Comment The carcinoma appears grade 3. Prognostic markers will be ordered. Dr. Lyndon Code has reviewed the case. The case was called to [facility] on 10/10/2016.   05/17/2001 Lumpectomy from prior breast cancer FINAL  DIAGNOSIS MICROSCOPIC EXAMINATION AND DIAGNOSIS 1. SENTINEL LYMPH NODE BIOPSY, RIGHT AXILLARY LYMPH NODE: ONE LYMPH NODE, POSITIVE FOR METASTATIC CARCINOMA (1/1) SEE COMMENT. 2. LUMPECTOMY, RIGHT BREAST: - INVASIVE MAMMARY CARCINOMA, NO SPECIAL TYPE (DUCTAL), 2.3 CM, MSBR GRADE 3/3. - DUCTAL CARCINOMA IN SITU, INTERMEDIATE GRADE. - EXTENSIVE INTRADUCTAL COMPONENT NEGATIVE (JCRT) - LYMPHOVASCULAR INVASION IDENTIFIED. - SEE COMMENT. 3. AXILLARY DISSECTION, RIGHT AXILLA: TWO OF SIX LYMPH NODES POSITIVE FOR METASTATIC TUMOR (2/6). SEE COMMENT. COMMENT 1. Tumor is seen within vessels adjacent to lymph node capsule. No definitive extension beyond capsule into soft tissues is identified. The largest extent of metastatic tumor is 0.9 cm. 2. Invasive tumor approaches to within 1 mm of the deep aspect of the specimen and 4 mm of both the anterior and superior aspects of the specimen. Despite these relatively close margins, it appears that the tumor has been completely excised. Fragments of skeletal muscle are seen at the deep aspect of the specimen and tumor is not seen to extend into skeletal muscle. Ductal carcinoma in situ approaches to within 3 mm of the posterior margin and 5 mm of the anterior margin. Ductal carcinoma is confined to the area of invasive tumor in the sections examined. A breast prognostic marker panel has already been issued for this tumor ([id number]). It shows that the invasive component is positive for estrogen and progesterone receptors and negative for Her 2 neu over expression is measures by the Herceptest method. For complete details please see the original report. 3.  Extracapsular extension in not identified. The largest focus of metastatic tumor measures 1.2 cm. ONCOLOGY TABLE-BREAST, INCISIONAL/EXCISIONAL BIOPSY OR MASTECTOMY WITH LYMPH NODES 1. Maximum tumor size (cm): 2.3 cm 2. Margins: Invasive component, distance to  closest margin: 80m, posterior In situ component, distance to closest margin: 320m posterior 3. Vascular/Lymphatic invasion: Identified 4. Histology, invasive component: No special type (ductal). 5. Grade, invasive component (Elston-Ellis modified Scarff-Bloom-Richardson): 3 of 3 Tubule formation grade: 3 of 3 Nuclear pleomorphism grade: 2 of 3 Mitotic grade: 3 of 3 6. % In situ: Less than 5 % of the measured component. 7. Histology, in situ component: Solid pattern 8. Grade, in situ component: Intermediate grade 9. Multicentric (separate tumors in different quadrants): No 10. Multifocal (separate tumors in same quadrant or biopsy): No 11. Axillary lymph nodes: #examined: 7 #with metastasis: 3 ITC (isolated tumor cells, < 0.43m75m 0 Micrometastasis (> 0.43mm44m 43mm)67m Metastasis > 2 mm: 3 Extracapsular extension: Not identified. 12. TNM Code: pT2, pN1, pMX   ONCOTYPE 11/17/16      RADIOGRAPHIC STUDIES: I have personally reviewed the radiological images as listed and agreed with the findings in the report. No results found.   Bone Density Scan 02/02/17 ASSESSMENT: The BMD measured at Femur Neck Right is 0.757 g/cm2 with a T-score of -2.0. This patient is considered OSTEOPENIC according to [facility]. L3 was excluded due to degenerative changes. There has been a statistically significant increase in BMD of Dual Femur hips and no change in Lumbar Spine since prior exam dated 02/01/2015. Patient is not a candidate for FRAX assessment. Site Region Measured Date Measured Age YA T-score BMD Significant CHANGE DualFemur Neck Right 02/02/2017 55.6 -2.0 0.757 g/cm2 * AP Spine  L1-L4 (L3) 02/02/2017    55.6         -0.9    1.089 g/cm2   ASSESSMENT & PLAN:  LauraSELICIA WINDOM 57 y.43 caucasian female with a history of Heart murmur, HLD, and osteoporosis.   1. Malignant neoplasm of upper-inner quadrant  of right breast, invasive ductal carcinoma, pT2N0M0, Stage 1B, ER/PR: positive, HER2 negative, Grade 3, Oncotype RS 21 -We previously discussed her imaging findings and the biopsy results in great details. -She underwent a bilateral mastectomy on 11/17/16. Her pathology results showed a 2.3cm tumor, ER/PR positive, HER2 negative, node negative disease, Oncotype score is 21, intermediate risk. -According to the most recent published Tailor X study result, there is no clinical benefit for adjuvant chemotherapy if Oncotype RS 11-25, especially in women age of 50 or25bove.  So I do not recommend adjuvant chemotherapy. I recommend her to start adjuvant aromatase inhibitor, due to her prior history of breast cancer, I recommend her to take AI for 7-10 years. Potential side effects were discussed in great detail. She agreed.  -She had mastectomy, therefore will not require adjuvant radiation -It is likely her current breast cancer is a second primary. 12/24/16 CT CAP and Bone scan were negative for osseous metastasis and metastatic disease in the chest, abdomen, or pelvis. -Had b/l mastectomy and does not need any mammograms. Will continue regular breast exams. -She started Letrozole on 12/18/16. Tolerating well with mild hot flashes and weight gain  -She underwent b/l breast reconstruction on 02/11/17 by Dr. DilliMarla Roeled well.  -She is clinically doing well. Lab reviewed, her CBC is WNLs CMP is pending. Her physical exam was unremarkable. There is no clinical concern for recurrence. -Her blood pressure was high in our office, but  it was normal at home, continue monitoring at home.  We discussed low-salt diet and exercise, she is motivated. -Continue Letrozole daily  -f/u in 6 months   2. History of Right Breast Cancer, pT2N1M0, ER+/pr+/her2-, in 2003 -Treated by Dr. Eston Esters -s/p lumpectomy and axillary lymph node dissection (3/7 positive lymph nodes), chemo, radiation, tamoxifen for 2 years and  Anastrozole for 3 years -total hysterectomy/BSO in 2006    3. Genetic Testing -Based on her young age of prior breast cancer she is eligible for Genetic testing. She agreed and a referral was sent.  -11/17/16 [facility]'s Breast/GYN panel testing did not reveal any pathogenic mutation in any of these genes.   4. Osteopenia  -After being on Arimidex for 3 years she developed osteoporosis.  -She was previously on Fosamax for 5 years. Now she is on Prolia injection for the past 2 years. She receives injections every 6 months.   -02/02/17 DEXA showed significant improvement from osteoporosis to osteopenia. Lowest density at Right Femur Neck with a T-Score of -2 -I encourage her to continue calcium, vitamin D and exercise.  -Repeat bone density scan every 2 years  -She will continue Prolia Injections every 6 months through her PCP.    5. Thyroid Nodules -12/24/16 CT Chest identifies multiple heterogenous appearing thyroid nodules. -Previous CT chest in 2004 noted bilateral thyroid nodules. Does not appear she has had dedicated thyroid US.  -She reports she had a goiter and was on synthroid >15 years ago but TSH level normalized with diet and exercise and she was weaned off medication.  -Her thyroid is prominent with R side nodularity on 01/22/17 exam.  -I will defer to her PCP Dr. Raoul Pitch for further evaluation and management.    PLAN: -Continue Letrozole daily  -f/u in 6 months with labs   No orders of the defined types were placed in this encounter.   All questions were answered. The patient knows to call the clinic with any problems, questions or concerns. I spent 15 minutes counseling the patient face to face. The total time spent in the appointment was 20 minutes and more than 50% was on counseling.  Dierdre Searles Dweik am acting as scribe for Dr. Truitt Merle.  I have reviewed the above documentation for accuracy and completeness, and I agree with the above.      Truitt Merle,  MD 11/26/2017

## 2017-11-26 ENCOUNTER — Inpatient Hospital Stay: Payer: BLUE CROSS/BLUE SHIELD | Attending: Hematology

## 2017-11-26 ENCOUNTER — Encounter: Payer: Self-pay | Admitting: Hematology

## 2017-11-26 ENCOUNTER — Telehealth: Payer: Self-pay | Admitting: Hematology

## 2017-11-26 ENCOUNTER — Inpatient Hospital Stay (HOSPITAL_BASED_OUTPATIENT_CLINIC_OR_DEPARTMENT_OTHER): Payer: BLUE CROSS/BLUE SHIELD | Admitting: Hematology

## 2017-11-26 VITALS — BP 148/96 | HR 74 | Temp 97.9°F | Resp 18 | Ht 65.0 in | Wt 149.4 lb

## 2017-11-26 DIAGNOSIS — Z90710 Acquired absence of both cervix and uterus: Secondary | ICD-10-CM | POA: Diagnosis not present

## 2017-11-26 DIAGNOSIS — Z170 Estrogen receptor positive status [ER+]: Secondary | ICD-10-CM | POA: Insufficient documentation

## 2017-11-26 DIAGNOSIS — C50211 Malignant neoplasm of upper-inner quadrant of right female breast: Secondary | ICD-10-CM

## 2017-11-26 DIAGNOSIS — Z79811 Long term (current) use of aromatase inhibitors: Secondary | ICD-10-CM

## 2017-11-26 DIAGNOSIS — Z9013 Acquired absence of bilateral breasts and nipples: Secondary | ICD-10-CM

## 2017-11-26 DIAGNOSIS — M810 Age-related osteoporosis without current pathological fracture: Secondary | ICD-10-CM

## 2017-11-26 DIAGNOSIS — E042 Nontoxic multinodular goiter: Secondary | ICD-10-CM | POA: Insufficient documentation

## 2017-11-26 DIAGNOSIS — N951 Menopausal and female climacteric states: Secondary | ICD-10-CM | POA: Diagnosis not present

## 2017-11-26 DIAGNOSIS — Z8042 Family history of malignant neoplasm of prostate: Secondary | ICD-10-CM | POA: Diagnosis not present

## 2017-11-26 DIAGNOSIS — Z9882 Breast implant status: Secondary | ICD-10-CM | POA: Diagnosis not present

## 2017-11-26 DIAGNOSIS — Z79899 Other long term (current) drug therapy: Secondary | ICD-10-CM

## 2017-11-26 DIAGNOSIS — Z808 Family history of malignant neoplasm of other organs or systems: Secondary | ICD-10-CM | POA: Diagnosis not present

## 2017-11-26 DIAGNOSIS — Z9221 Personal history of antineoplastic chemotherapy: Secondary | ICD-10-CM | POA: Diagnosis not present

## 2017-11-26 DIAGNOSIS — Z853 Personal history of malignant neoplasm of breast: Secondary | ICD-10-CM | POA: Diagnosis not present

## 2017-11-26 DIAGNOSIS — Z923 Personal history of irradiation: Secondary | ICD-10-CM

## 2017-11-26 DIAGNOSIS — Z800 Family history of malignant neoplasm of digestive organs: Secondary | ICD-10-CM

## 2017-11-26 LAB — CBC WITH DIFFERENTIAL/PLATELET
ABS IMMATURE GRANULOCYTES: 0.01 10*3/uL (ref 0.00–0.07)
Basophils Absolute: 0 10*3/uL (ref 0.0–0.1)
Basophils Relative: 0 %
Eosinophils Absolute: 0.1 10*3/uL (ref 0.0–0.5)
Eosinophils Relative: 1 %
HCT: 44.2 % (ref 36.0–46.0)
HEMOGLOBIN: 14.8 g/dL (ref 12.0–15.0)
Immature Granulocytes: 0 %
LYMPHS ABS: 1.7 10*3/uL (ref 0.7–4.0)
Lymphocytes Relative: 34 %
MCH: 29.1 pg (ref 26.0–34.0)
MCHC: 33.5 g/dL (ref 30.0–36.0)
MCV: 87 fL (ref 80.0–100.0)
MONO ABS: 0.4 10*3/uL (ref 0.1–1.0)
MONOS PCT: 8 %
NEUTROS ABS: 2.7 10*3/uL (ref 1.7–7.7)
Neutrophils Relative %: 57 %
Platelets: 250 10*3/uL (ref 150–400)
RBC: 5.08 MIL/uL (ref 3.87–5.11)
RDW: 12.9 % (ref 11.5–15.5)
WBC: 4.9 10*3/uL (ref 4.0–10.5)
nRBC: 0 % (ref 0.0–0.2)

## 2017-11-26 LAB — COMPREHENSIVE METABOLIC PANEL
ALBUMIN: 4.4 g/dL (ref 3.5–5.0)
ALT: 37 U/L (ref 0–44)
AST: 32 U/L (ref 15–41)
Alkaline Phosphatase: 55 U/L (ref 38–126)
Anion gap: 9 (ref 5–15)
BUN: 16 mg/dL (ref 6–20)
CHLORIDE: 104 mmol/L (ref 98–111)
CO2: 27 mmol/L (ref 22–32)
Calcium: 9.5 mg/dL (ref 8.9–10.3)
Creatinine, Ser: 1.04 mg/dL — ABNORMAL HIGH (ref 0.44–1.00)
GFR calc Af Amer: >60 mL/min (ref >60)
GFR calc non Af Amer: 59 mL/min — ABNORMAL LOW (ref >60)
GLUCOSE: 86 mg/dL (ref 70–99)
POTASSIUM: 4.5 mmol/L (ref 3.5–5.1)
Sodium: 140 mmol/L (ref 135–145)
Total Bilirubin: 0.5 mg/dL (ref 0.3–1.2)
Total Protein: 7.5 g/dL (ref 6.5–8.1)

## 2017-11-26 NOTE — Telephone Encounter (Signed)
Appts scheduled avs/calendar declined due to my chart access per 10/24 los

## 2017-12-10 ENCOUNTER — Other Ambulatory Visit: Payer: Self-pay | Admitting: Endocrinology

## 2017-12-10 DIAGNOSIS — E042 Nontoxic multinodular goiter: Secondary | ICD-10-CM

## 2017-12-15 ENCOUNTER — Encounter: Payer: Self-pay | Admitting: Plastic Surgery

## 2017-12-15 ENCOUNTER — Ambulatory Visit: Payer: BLUE CROSS/BLUE SHIELD | Admitting: Plastic Surgery

## 2017-12-15 VITALS — BP 122/68 | HR 78 | Ht 64.0 in | Wt 140.0 lb

## 2017-12-15 DIAGNOSIS — Z9013 Acquired absence of bilateral breasts and nipples: Secondary | ICD-10-CM

## 2017-12-15 NOTE — Progress Notes (Addendum)
Patient ID: Olivia [name], female    DOB: 1960/04/23, 57 y.o.   MRN: [id number]   No chief complaint on file.   The patient is a 57 year old white female here for evaluation of her bilateral breast reconstruction.  Overall she has been doing extremely well and is pleased with the results.  There is been no further redness, swelling or signs of infection the implants are soft.  The patient underwent bilateral mastectomies for right invasive ductal carcinoma of the upper inner quadrant.  It was ER/PR positive, HER-2 negative and Ki-67 35%.  She is 5 feet 4 inches tall.  Her preop bra= 26 B.  The right breast received chemotherapy due to a right lumpectomy in 2003.  She has bilateral implants.  She is requesting nipple areole attached to reconstruction.  There is mild volume loss on the medial portion of both breasts.   Review of Systems  Constitutional: Negative.  Negative for activity change and appetite change.  HENT: Negative.   Eyes: Negative.   Respiratory: Positive for chest tightness.   Gastrointestinal: Negative.   Genitourinary: Negative.   Musculoskeletal: Negative.   Skin: Negative.  Negative for color change and wound.  Neurological: Negative.   Hematological: Negative.   Psychiatric/Behavioral: Negative.     Past Medical History:  Diagnosis Date  . Breast cancer ([place]) 2018   Lumpectomy, chemotherapy and radiation with recurrence 2018. Now on Femara  . Family history of breast cancer   . Genetic testing 10/30/2016   Breast/GYN panel (23 genes) @ [facility] - No pathogenic mutations detected  . GERD (gastroesophageal reflux disease)    OTC medication  . Heart murmur    as a child - Echo in early 20s "ok"  . History of echocardiogram 05/2015   Echo 4/17: EF 55-60%, GLS -19.6%, normal wall motion  . History of exercise stress test 05/2015   ETT 4/17 - No ST changes  . HLD (hyperlipidemia)   . Multiple thyroid nodules 2009   Multiple nodules: Normal ultrasound reported  at diagnosis  . Osteoporosis   . Personal history of chemotherapy   . Personal history of radiation therapy     Past Surgical History:  Procedure Laterality Date  . BREAST LUMPECTOMY Right 2003  . BREAST RECONSTRUCTION WITH PLACEMENT OF TISSUE EXPANDER AND FLEX HD (ACELLULAR HYDRATED DERMIS) Bilateral 11/17/2016   Procedure: IMEDITATE BREAST RECONSTRUCTION WITH PLACEMENT OF TISSUE EXPANDER AND FLEX HD (ACELLULAR HYDRATED DERMIS);  Surgeon: Wallace Going, DO;  Location: [place];  Service: Plastics;  Laterality: Bilateral;  . BREAST SURGERY  2003 /right   lumpectomy; chemotherapy and radiation  . REMOVAL OF BILATERAL TISSUE EXPANDERS WITH PLACEMENT OF BILATERAL BREAST IMPLANTS Bilateral 02/11/2017   Procedure: REMOVAL OF BILATERAL TISSUE EXPANDERS WITH PLACEMENT OF BILATERAL SILICONE BREAST IMPLANTS;  Surgeon: Wallace Going, DO;  Location: [place];  Service: Plastics;  Laterality: Bilateral;  . TOTAL ABDOMINAL HYSTERECTOMY W/ BILATERAL SALPINGOOPHORECTOMY     for treatment of breast treatment  . TOTAL MASTECTOMY Bilateral 11/17/2016   Procedure: RIGHT MASTECTOMY AND LEFT PROPHYLATIC MASTECTOMY;  Surgeon: Jovita Kussmaul, MD;  Location: [place];  Service: General;  Laterality: Bilateral;      Current Outpatient Medications:  .  Calcium 500 MG CHEW, Chew by mouth., Disp: , Rfl:  .  denosumab (PROLIA) 60 MG/ML SOLN injection, Inject 60 mg into the skin every 6 (six) months. Administer in upper arm, thigh, or abdomen, Disp: 1 Syringe, Rfl: 1 .  letrozole (FEMARA) 2.5 MG tablet, Take 1 tablet (2.5 mg total) by mouth daily., Disp: 90 tablet, Rfl: 3 .  meloxicam (MOBIC) 15 MG tablet, TAKE 1 TABLET BY MOUTH EVERY DAY, Disp: 30 tablet, Rfl: 0   Objective:   Vitals:   12/15/17 1414  BP: 122/68  Pulse: 78  SpO2: 98%    Physical Exam  Constitutional: She appears well-developed and well-nourished.  HENT:  Head: Normocephalic and atraumatic.  Eyes: Pupils are equal,  round, and reactive to light.  Cardiovascular: Normal rate.  Pulmonary/Chest: Effort normal. No respiratory distress.  Abdominal: Soft. She exhibits no distension.  Musculoskeletal: Normal range of motion.  Neurological: She is alert.  Skin: Skin is warm.  Psychiatric: She has a normal mood and affect. Her behavior is normal. Judgment and thought content normal.    Assessment & Plan:  Acquired absence of bilateral breasts and nipples  S/P mastectomy, bilateral Recommend bilateral nipple areola reconstruction with tattoo placement and we can try to do this prior to Christmas.  We also talked about possible fat grafting due to some volume loss on the medial aspect of both breasts.  The patient would like to wait on that for now.  I think that is reasonable.   EMLA script sent to pharmacy. Loel Lofty Raeanne Deschler, DO

## 2017-12-16 MED ORDER — LIDOCAINE-PRILOCAINE 2.5-2.5 % EX CREA: 1.0000 "application " | TOPICAL_CREAM | CUTANEOUS | 0 refills | Status: DC | PRN

## 2017-12-16 NOTE — Addendum Note (Signed)
Addended by: Wallace Going on: 12/16/2017 07:54 AM   Modules accepted: Orders

## 2017-12-17 DIAGNOSIS — C50911 Malignant neoplasm of unspecified site of right female breast: Secondary | ICD-10-CM | POA: Diagnosis not present

## 2017-12-24 DIAGNOSIS — C50911 Malignant neoplasm of unspecified site of right female breast: Secondary | ICD-10-CM | POA: Diagnosis not present

## 2018-01-13 ENCOUNTER — Other Ambulatory Visit: Payer: Self-pay | Admitting: Hematology

## 2018-01-14 ENCOUNTER — Ambulatory Visit
Admission: RE | Admit: 2018-01-14 | Discharge: 2018-01-14 | Disposition: A | Discharge status: Home / Self Care | Payer: BLUE CROSS/BLUE SHIELD | Source: Ambulatory Visit | Attending: Endocrinology | Admitting: Endocrinology

## 2018-01-14 DIAGNOSIS — E042 Nontoxic multinodular goiter: Secondary | ICD-10-CM

## 2018-01-14 DIAGNOSIS — E041 Nontoxic single thyroid nodule: Secondary | ICD-10-CM | POA: Diagnosis not present

## 2018-01-21 ENCOUNTER — Ambulatory Visit (INDEPENDENT_AMBULATORY_CARE_PROVIDER_SITE_OTHER): Payer: BLUE CROSS/BLUE SHIELD | Admitting: Endocrinology

## 2018-01-21 ENCOUNTER — Encounter: Payer: Self-pay | Admitting: Endocrinology

## 2018-01-21 VITALS — BP 122/82 | HR 71 | Ht 64.0 in | Wt 147.6 lb

## 2018-01-21 DIAGNOSIS — E042 Nontoxic multinodular goiter: Secondary | ICD-10-CM | POA: Diagnosis not present

## 2018-01-21 NOTE — Progress Notes (Signed)
Patient ID: Olivia [name], female   DOB: 01/06/1961, 57 y.o.   MRN: [id number]          Reason for Appointment: Goiter, follow-up    History of Present Illness:   Patient has been referred by her PCP Dr. Raoul Pitch  The patient's thyroid enlargement was first discovered around the year 1999 At that time she was referred to an endocrinologist also She was also told that she had mild hypothyroidism and was given levothyroxine for a couple of years She does not remember details of any further evaluation no previous ultrasound reports are available No previous biopsy has been done  Recent history: Patient has had a swelling on the right side for quite some time  She has had difficulty no with swallowing recently and no choking sensation of pressure in the neck  She had an ultrasound done by her PCP in 04/2017 showing multiple nodules and 2 of the nodules were relatively significant in characteristics. However patient was recommended only a follow-up based on her clinical picture which she is doing now   Lab Results  Component Value Date   FREET4 0.80 04/14/2017   FREET4 0.78 12/26/2014   TSH 1.77 04/14/2017   TSH 2.54 12/26/2014   TSH 1.24 01/29/2012    She has had a follow-up ultrasound exam in 12/19 which shows change in the size of the goiter   Previously radiologist had been concerned about right superior and right inferior nodules which are total points of 5 and 6 respectively  Recent ultrasound report: Similar findings of multinodular goiter. No definitive new or enlarging thyroid nodules. 2. Nodules #1 and #3 have been down graded from TR4 nodules to TR3 nodules, however both still meet imaging criteria to recommend a 1 year follow-up as clinically indicated. 3. Nodules #2, 4 and 6 are unchanged however again all meet imaging criteria to recommend a 1 year follow-up. Significant nodules were described as follows: -   Allergies as of 01/21/2018   No Known Allergies       Medication List       Accurate as of January 21, 2018  4:15 PM. Always use your most recent med list.        Calcium 500 MG Chew Chew by mouth.   denosumab 60 MG/ML Soln injection Commonly known as:  PROLIA Inject 60 mg into the skin every 6 (six) months. Administer in upper arm, thigh, or abdomen   letrozole 2.5 MG tablet Commonly known as:  FEMARA TAKE 1 TABLET BY MOUTH EVERY DAY   lidocaine-prilocaine cream Commonly known as:  EMLA Apply 1 application topically as needed. Apply to nipple area 30-45 minutes prior to procedure.   meloxicam 15 MG tablet Commonly known as:  MOBIC TAKE 1 TABLET BY MOUTH EVERY DAY       Allergies: No Known Allergies  Past Medical History:  Diagnosis Date  . Breast cancer ([place]) 2018   Lumpectomy, chemotherapy and radiation with recurrence 2018. Now on Femara  . Family history of breast cancer   . Genetic testing 10/30/2016   Breast/GYN panel (23 genes) @ [facility] - No pathogenic mutations detected  . GERD (gastroesophageal reflux disease)    OTC medication  . Heart murmur    as a child - Echo in early 20s "ok"  . History of echocardiogram 05/2015   Echo 4/17: EF 55-60%, GLS -19.6%, normal wall motion  . History of exercise stress test 05/2015   ETT 4/17 - No ST changes  .  HLD (hyperlipidemia)   . Multiple thyroid nodules 2009   Multiple nodules: Normal ultrasound reported at diagnosis  . Osteoporosis   . Personal history of chemotherapy   . Personal history of radiation therapy     Past Surgical History:  Procedure Laterality Date  . BREAST LUMPECTOMY Right 2003  . BREAST RECONSTRUCTION WITH PLACEMENT OF TISSUE EXPANDER AND FLEX HD (ACELLULAR HYDRATED DERMIS) Bilateral 11/17/2016   Procedure: IMEDITATE BREAST RECONSTRUCTION WITH PLACEMENT OF TISSUE EXPANDER AND FLEX HD (ACELLULAR HYDRATED DERMIS);  Surgeon: Wallace Going, DO;  Location: [place];  Service: Plastics;  Laterality: Bilateral;  . BREAST SURGERY  2003 /right    lumpectomy; chemotherapy and radiation  . REMOVAL OF BILATERAL TISSUE EXPANDERS WITH PLACEMENT OF BILATERAL BREAST IMPLANTS Bilateral 02/11/2017   Procedure: REMOVAL OF BILATERAL TISSUE EXPANDERS WITH PLACEMENT OF BILATERAL SILICONE BREAST IMPLANTS;  Surgeon: Wallace Going, DO;  Location: [place];  Service: Plastics;  Laterality: Bilateral;  . TOTAL ABDOMINAL HYSTERECTOMY W/ BILATERAL SALPINGOOPHORECTOMY     for treatment of breast treatment  . TOTAL MASTECTOMY Bilateral 11/17/2016   Procedure: RIGHT MASTECTOMY AND LEFT PROPHYLATIC MASTECTOMY;  Surgeon: Jovita Kussmaul, MD;  Location: [facility] OR;  Service: General;  Laterality: Bilateral;    Family History  Problem Relation Age of Onset  . Diabetes Other        1ST DEGREE RELATIVE  . Coronary artery disease Mother        1ST DEGREE RELATIVE  . Heart attack Mother 35  . Skin cancer Mother   . Diabetes Father   . Kidney disease Father   . Prostate cancer Father 69       deceased 72  . Cancer Brother 24       multiple myeloma   . Lung cancer Maternal Uncle        in 2 mat uncles  . Breast cancer Paternal Aunt 88  . Breast cancer Cousin        dx 38s; daughter of unaffected paternal aunt  . Breast cancer Cousin        dx 43s; another daughter of unaffected paternal aunt  . Esophageal cancer Cousin        pat first cousin    Social History:  reports that she has never smoked. She has never used smokeless tobacco. She reports current alcohol use. She reports that she does not use drugs.    Review of Systems  She has been treated with Prolia for osteoporosis secondary to breast cancer chemotherapy    Examination:   BP 122/82 (BP Location: Left Arm, Patient Position: Sitting, Cuff Size: Normal)   Pulse 71   Ht '5\' 4"'$  (1.626 m)   Wt 147 lb 9.6 oz (67 kg)   SpO2 97%   BMI 25.34 kg/m   THYROID exam:  Neck circumference is 36.5 cm over the mid thyroid Right lobe of thyroid is about 2-1/2-3 times  normal She has multiple small nodules felt along the right lobe extending to the upper pole She has a nodular isthmus also at least twice normal She has a 1.5-2 cm nodule on the left superior pole also Pember[place] sign negative Has no lymphadenopathy.           Assessment/Plan:  Multinodular goiter, long-standing with numerous nodules, mostly under 2 cm.   Has been euthyroid consistently  She has no local pressure symptoms Her exam is similar to her previous exam Also her ultrasound which is now being repeated after  9 months shows no significant change and no nodule is showing characteristics requiring needle aspiration biopsy  Since she has had a longstanding stable goiter will continue to follow her annually in May do only 1 more ultrasound in 1 year  Elayne Snare 01/21/2018

## 2018-02-04 ENCOUNTER — Telehealth: Payer: Self-pay | Admitting: Plastic Surgery

## 2018-02-04 DIAGNOSIS — Z9889 Other specified postprocedural states: Secondary | ICD-10-CM

## 2018-02-04 NOTE — Telephone Encounter (Signed)
-----   Message from [facility] sent at 02/02/2018  7:49 AM EST ----- I believe we still need to place an order. If you can place it, I'll call PT and the patient to get everything going.  ----- Message ----- From: Wallace Going, DO Sent: 02/01/2018   6:10 PM EST To: Angela Burke  Has this been resolved or do I need to put in an order? Just checking before I erase message. ----- Message ----- From: Angela Burke Sent: 01/15/2018   2:58 PM EST To: [place], DO  Patient called inquiring about physical therapy. I did not see anything in the patient's chart or a referral to physical therapy. Is this something you would like for this patient to have?

## 2018-03-02 ENCOUNTER — Ambulatory Visit: Payer: BLUE CROSS/BLUE SHIELD

## 2018-03-16 NOTE — Addendum Note (Signed)
Addended by: Wallace Going on: 03/16/2018 12:56 PM   Modules accepted: Orders

## 2018-03-22 ENCOUNTER — Telehealth: Payer: Self-pay | Admitting: Hematology

## 2018-03-22 NOTE — Telephone Encounter (Signed)
Called patient per scheduling voicemail to reschedule appt.  Patient did not answer.

## 2018-03-22 NOTE — Telephone Encounter (Signed)
Patient came in to reschedule appt.Marland Kitchen

## 2018-03-29 ENCOUNTER — Telehealth: Payer: Self-pay

## 2018-03-29 ENCOUNTER — Ambulatory Visit (INDEPENDENT_AMBULATORY_CARE_PROVIDER_SITE_OTHER): Payer: BLUE CROSS/BLUE SHIELD

## 2018-03-29 DIAGNOSIS — M810 Age-related osteoporosis without current pathological fracture: Secondary | ICD-10-CM

## 2018-03-29 MED ORDER — DENOSUMAB 60 MG/ML ~~LOC~~ SOSY
60.0000 mg | PREFILLED_SYRINGE | Freq: Once | SUBCUTANEOUS | Status: AC
  Administered 2018-03-29: 60 mg via SUBCUTANEOUS

## 2018-03-29 NOTE — Addendum Note (Signed)
Addended by: Gerilyn Nestle on: 03/29/2018 09:24 AM   Modules accepted: Orders

## 2018-03-29 NOTE — Telephone Encounter (Signed)
Faxed signed medical clearance form to [facility] attention Heron Nay at [phone], sent to HIM for scanning to chart.

## 2018-03-29 NOTE — Progress Notes (Addendum)
Olivia [name] is a 58 y.o. female presents to the office today for Prolia injections, per physician's orders. Original order: 02/02/2015. Prolia 60mg /ml every 6 months.  Prolia 60mg  SQ was administered in left upper arm today. Patient tolerated injection. Patient due for follow up labs/provider appt: Yes. CPE scheduled 04/2018., appt made: Yes.  Patient next injection due: 09/27/2018, appt made Yes  [place] screening examination/treatment/procedure(s) were performed by non-physician practitioner and as supervising physician I was immediately available for consultation/collaboration.  I agree with above assessment and plan.  Electronically Signed by: Howard Pouch, DO [facility] [place]

## 2018-04-07 DIAGNOSIS — H1045 Other chronic allergic conjunctivitis: Secondary | ICD-10-CM | POA: Diagnosis not present

## 2018-04-16 ENCOUNTER — Encounter: Payer: BLUE CROSS/BLUE SHIELD | Admitting: Family Medicine

## 2018-04-22 ENCOUNTER — Ambulatory Visit (INDEPENDENT_AMBULATORY_CARE_PROVIDER_SITE_OTHER): Payer: BLUE CROSS/BLUE SHIELD | Admitting: Family Medicine

## 2018-04-22 ENCOUNTER — Other Ambulatory Visit: Payer: Self-pay

## 2018-04-22 ENCOUNTER — Encounter: Payer: Self-pay | Admitting: Family Medicine

## 2018-04-22 VITALS — BP 135/82 | HR 74 | Temp 98.2°F | Resp 16 | Ht 64.0 in | Wt 151.0 lb

## 2018-04-22 DIAGNOSIS — E039 Hypothyroidism, unspecified: Secondary | ICD-10-CM

## 2018-04-22 DIAGNOSIS — E042 Nontoxic multinodular goiter: Secondary | ICD-10-CM | POA: Diagnosis not present

## 2018-04-22 DIAGNOSIS — M810 Age-related osteoporosis without current pathological fracture: Secondary | ICD-10-CM

## 2018-04-22 DIAGNOSIS — Z0000 Encounter for general adult medical examination without abnormal findings: Secondary | ICD-10-CM | POA: Diagnosis not present

## 2018-04-22 DIAGNOSIS — Z9013 Acquired absence of bilateral breasts and nipples: Secondary | ICD-10-CM | POA: Diagnosis not present

## 2018-04-22 DIAGNOSIS — Z170 Estrogen receptor positive status [ER+]: Secondary | ICD-10-CM

## 2018-04-22 DIAGNOSIS — E663 Overweight: Secondary | ICD-10-CM | POA: Diagnosis not present

## 2018-04-22 DIAGNOSIS — Z131 Encounter for screening for diabetes mellitus: Secondary | ICD-10-CM | POA: Diagnosis not present

## 2018-04-22 DIAGNOSIS — M1990 Unspecified osteoarthritis, unspecified site: Secondary | ICD-10-CM | POA: Insufficient documentation

## 2018-04-22 DIAGNOSIS — C50211 Malignant neoplasm of upper-inner quadrant of right female breast: Secondary | ICD-10-CM | POA: Diagnosis not present

## 2018-04-22 LAB — COMPREHENSIVE METABOLIC PANEL
ALK PHOS: 53 U/L (ref 39–117)
ALT: 25 U/L (ref 0–35)
AST: 22 U/L (ref 0–37)
Albumin: 5 g/dL (ref 3.5–5.2)
BILIRUBIN TOTAL: 0.6 mg/dL (ref 0.2–1.2)
BUN: 21 mg/dL (ref 6–23)
CO2: 28 mEq/L (ref 19–32)
CREATININE: 0.8 mg/dL (ref 0.40–1.20)
Calcium: 9.8 mg/dL (ref 8.4–10.5)
Chloride: 102 mEq/L (ref 96–112)
GFR: 73.98 mL/min (ref >60.00)
Glucose, Bld: 100 mg/dL — ABNORMAL HIGH (ref 70–99)
Potassium: 4.4 mEq/L (ref 3.5–5.1)
Sodium: 139 mEq/L (ref 135–145)
Total Protein: 7.4 g/dL (ref 6.0–8.3)

## 2018-04-22 LAB — VITAMIN D 25 HYDROXY (VIT D DEFICIENCY, FRACTURES): VITD: 27.97 ng/mL — ABNORMAL LOW (ref 30.00–100.00)

## 2018-04-22 LAB — LIPID PANEL
Cholesterol: 264 mg/dL — ABNORMAL HIGH (ref 0–200)
HDL: 57.5 mg/dL (ref >39.00)
LDL Cholesterol: 174 mg/dL — ABNORMAL HIGH (ref 0–99)
NonHDL: 206.52
Total CHOL/HDL Ratio: 5
Triglycerides: 161 mg/dL — ABNORMAL HIGH (ref 0.0–149.0)
VLDL: 32.2 mg/dL (ref 0.0–40.0)

## 2018-04-22 LAB — CBC
HCT: 44.7 % (ref 36.0–46.0)
HEMOGLOBIN: 15 g/dL (ref 12.0–15.0)
MCHC: 33.7 g/dL (ref 30.0–36.0)
MCV: 86.9 fl (ref 78.0–100.0)
Platelets: 287 10*3/uL (ref 150.0–400.0)
RBC: 5.14 Mil/uL — AB (ref 3.87–5.11)
RDW: 13.9 % (ref 11.5–15.5)
WBC: 6.1 10*3/uL (ref 4.0–10.5)

## 2018-04-22 LAB — TSH: TSH: 2.4 u[IU]/mL (ref 0.35–4.50)

## 2018-04-22 LAB — HEMOGLOBIN A1C: Hgb A1c MFr Bld: 5.9 % (ref 4.6–6.5)

## 2018-04-22 MED ORDER — MELOXICAM 15 MG PO TABS: 15.0000 mg | ORAL_TABLET | Freq: Every day | ORAL | 3 refills | Status: DC

## 2018-04-22 NOTE — Progress Notes (Signed)
Patient ID: Olivia [name], female  DOB: 1961/01/11, 58 y.o.   MRN: [id number] Patient Care Team    Relationship Specialty Notifications Start End  Ma Hillock, DO PCP - General Family Medicine  12/26/14   Truitt Merle, MD Consulting Physician Hematology  10/15/16   Jovita Kussmaul, MD Consulting Physician General Surgery  10/15/16   Gery Pray, MD Consulting Physician Radiation Oncology  10/15/16   Sable Feil, MD Consulting Physician Gastroenterology  04/14/17   Molli Posey, MD Consulting Physician Obstetrics and Gynecology  04/14/17   Gardenia Phlegm, NP Nurse Practitioner Hematology and Oncology  07/08/17   Elayne Snare, MD Consulting Physician Endocrinology  04/22/18    Comment: thyroid nodules    Chief Complaint  Patient presents with  . Annual Exam    Pt is not fasting.     Subjective:  Olivia [name] is a 58 y.o.  Female  present for CPE.. All past medical history, surgical history, allergies, family history, immunizations, medications and social history were updated in the electronic medical record today. All recent labs, ED visits and hospitalizations within the last year were reviewed.  Health maintenance:  Colonoscopy: completed by Dr. Sharlett Iles January 2014. Ten-year follow-up. Mammogram:  Double mastectomy 09/30/2016, patient being treated for recurrent breast cancer. Cervical cancer screening: last pap: History of hysterectomy, pelvic exams completed by Dr. Grayling Congress. Immunizations: tdap UTD 2013, Influenza UTD 2019(encouraged yearly). Shingrix series completed. Infectious disease screening: HIV and  Hep C completed DEXA: History of osteoporosis, on prolia injections every 6 months. Last DEXA scan 02/02/2017 improving -2.0. Follow-up every 2 years Assistive device: none Oxygen VCB:SWHQ Patient has a Dental home. Hospitalizations/ED visits: reviewed  Depression screen [facility] 2/9 04/22/2018 04/14/2017 01/29/2012  Decreased Interest 0 0 0   Down, Depressed, Hopeless 0 0 0  PHQ - 2 Score 0 0 0  Altered sleeping 0 - -  Tired, decreased energy 0 - -  Change in appetite 0 - -  Feeling bad or failure about yourself  0 - -  Trouble concentrating 0 - -  Moving slowly or fidgety/restless 0 - -  Suicidal thoughts 0 - -  PHQ-9 Score 0 - -  Difficult doing work/chores Not difficult at all - -   GAD 7 : Generalized Anxiety Score 04/22/2018  Nervous, Anxious, on Edge 1  Control/stop worrying 1  Worry too much - different things 1  Trouble relaxing 0  Restless 0  Easily annoyed or irritable 1  Afraid - awful might happen 0  Total GAD 7 Score 4  Anxiety Difficulty Not difficult at all     Immunization History  Administered Date(s) Administered  . Influenza Split 11/26/2011  . Influenza,inj,Quad PF,6+ Mos 11/20/2013  . Influenza-Unspecified 12/05/2014, 10/03/2015, 11/10/2016, 10/04/2017  . Tdap 01/29/2012  . Zoster Recombinat (Shingrix) 06/03/2017, 08/18/2017    Past Medical History:  Diagnosis Date  . Breast cancer ([place]) 2018   Lumpectomy, chemotherapy and radiation with recurrence 2018. Now on Femara  . Family history of breast cancer   . Genetic testing 10/30/2016   Breast/GYN panel (23 genes) @ [facility] - No pathogenic mutations detected  . GERD (gastroesophageal reflux disease)    OTC medication  . Heart murmur    as a child - Echo in early 20s "ok"  . History of echocardiogram 05/2015   Echo 4/17: EF 55-60%, GLS -19.6%, normal wall motion  . History of exercise stress test 05/2015   ETT 4/17 - No ST  changes  . HLD (hyperlipidemia)   . Multiple thyroid nodules 2009   Multiple nodules: Normal ultrasound reported at diagnosis  . Osteoporosis   . Personal history of chemotherapy   . Personal history of radiation therapy    No Known Allergies Past Surgical History:  Procedure Laterality Date  . BREAST LUMPECTOMY Right 2003  . BREAST RECONSTRUCTION WITH PLACEMENT OF TISSUE EXPANDER AND FLEX HD (ACELLULAR  HYDRATED DERMIS) Bilateral 11/17/2016   Procedure: IMEDITATE BREAST RECONSTRUCTION WITH PLACEMENT OF TISSUE EXPANDER AND FLEX HD (ACELLULAR HYDRATED DERMIS);  Surgeon: Wallace Going, DO;  Location: [place];  Service: Plastics;  Laterality: Bilateral;  . BREAST SURGERY  2003 /right   lumpectomy; chemotherapy and radiation  . REMOVAL OF BILATERAL TISSUE EXPANDERS WITH PLACEMENT OF BILATERAL BREAST IMPLANTS Bilateral 02/11/2017   Procedure: REMOVAL OF BILATERAL TISSUE EXPANDERS WITH PLACEMENT OF BILATERAL SILICONE BREAST IMPLANTS;  Surgeon: Wallace Going, DO;  Location: [facility];  Service: Plastics;  Laterality: Bilateral;  . TOTAL ABDOMINAL HYSTERECTOMY W/ BILATERAL SALPINGOOPHORECTOMY     for treatment of breast treatment  . TOTAL MASTECTOMY Bilateral 11/17/2016   Procedure: RIGHT MASTECTOMY AND LEFT PROPHYLATIC MASTECTOMY;  Surgeon: Jovita Kussmaul, MD;  Location: [facility] OR;  Service: General;  Laterality: Bilateral;   Family History  Problem Relation Age of Onset  . Diabetes Other        1ST DEGREE RELATIVE  . Coronary artery disease Mother        1ST DEGREE RELATIVE  . Heart attack Mother 47  . Skin cancer Mother   . Diabetes Father   . Kidney disease Father   . Prostate cancer Father 45       deceased 31  . Cancer Brother 62       multiple myeloma   . Lung cancer Maternal Uncle        in 2 mat uncles  . Breast cancer Paternal Aunt 16  . Breast cancer Cousin        dx 42s; daughter of unaffected paternal aunt  . Breast cancer Cousin        dx 50s; another daughter of unaffected paternal aunt  . Esophageal cancer Cousin        pat first cousin   Social History   Social History Narrative   Married. Bobby.   Children: none. No pets.    Stay at home.    Wears her seatbelt. Smoke alarm in the home.    Feels safe in her relationships.    Exercise 5x week.     Allergies as of 04/22/2018   No Known Allergies     Medication List       Accurate as of  April 22, 2018 12:50 PM. Always use your most recent med list.        Calcium 500 MG Chew Chew by mouth.   denosumab 60 MG/ML Soln injection Commonly known as:  PROLIA Inject 60 mg into the skin every 6 (six) months. Administer in upper arm, thigh, or abdomen   letrozole 2.5 MG tablet Commonly known as:  FEMARA TAKE 1 TABLET BY MOUTH EVERY DAY   meloxicam 15 MG tablet Commonly known as:  MOBIC Take 1 tablet (15 mg total) by mouth daily.       All past medical history, surgical history, allergies, family history, immunizations andmedications were updated in the EMR today and reviewed under the history and medication portions of their EMR.     No results found  for this or any previous visit (from the past 2160 hour(s)).  US Thyroid  Result Date: 01/14/2018 CLINICAL DATA:  Prior ultrasound follow-up. EXAM: THYROID ULTRASOUND TECHNIQUE: Ultrasound examination of the thyroid gland and adjacent soft tissues was performed. COMPARISON:  04/27/2017 FINDINGS: Parenchymal Echotexture: Moderately heterogenous Isthmus: Normal in size measuring 0.3 cm in diameter unchanged Right lobe: Borderline enlarged measuring 5.4 x 2.9 x 2.6 cm, unchanged, previously, 5.9 x 2.7 x 2.5 cm Left lobe: Normal in size measuring 5.0 x 2.4 x 1.8 cm, unchanged, previously, 4.3 x 2.4 x 1.8 cm _________________________________________________________ Estimated total number of nodules >/= 1 cm: 6-10 Number of spongiform nodules >/=  2 cm not described below (TR1): 0 Number of mixed cystic and solid nodules >/= 1.5 cm not described below (TR2): 0 _________________________________________________________ Nodule # 1 (previously labeled 2): Prior biopsy: No Location: Right; Superior Maximum size: 1.8 cm; Other 2 dimensions: 1.8 x 1.8 cm, previously, 1.9 x 1.7 x 1.5 cm Composition: solid/almost completely solid (2) Echogenicity: isoechoic (1) Shape: not taller-than-wide (0) Margins: smooth (0) Echogenic foci: none (0) ACR TI-RADS  total points: 3. ACR TI-RADS risk category:  TR3 (3 points). Significant change in size (>/= 20% in two dimensions and minimal increase of 2 mm): No Change in features: Yes - nodule has undergone interval partial cystic degeneration, a typically benign finding; additionally, the nodule no longer appears taller than wide. Change in ACR TI-RADS risk category: Yes - nodule currently classified as a TR3 nodule, previously a TR4 nodule. ACR TI-RADS recommendations: *Given size (>/= 1.5 - 2.4 cm) and appearance, a follow-up ultrasound in 1 year should be considered based on TI-RADS criteria. _________________________________________________________ Nodule # 2 (previously labeled 4): Prior biopsy: No Location: Right; Mid Maximum size: 1.7 cm; Other 2 dimensions: 1.5 x 1.2 cm, previously, 1.7 x 1.6 x 1.2 cm Composition: solid/almost completely solid (2) Echogenicity: isoechoic (1) Shape: not taller-than-wide (0) Margins: smooth (0) Echogenic foci: none (0) ACR TI-RADS total points: 3. ACR TI-RADS risk category:  TR3 (3 points). Significant change in size (>/= 20% in two dimensions and minimal increase of 2 mm): No Change in features: No Change in ACR TI-RADS risk category: No ACR TI-RADS recommendations: *Given size (>/= 1.5 - 2.4 cm) and appearance, a follow-up ultrasound in 1 year should be considered based on TI-RADS criteria. _________________________________________________________ Nodule # 3 (previously labeled 6): Prior biopsy: No Location: Right; Mid Maximum size: 1.5 cm; Other 2 dimensions: 1.4 x 1.3 cm, previously, 1.6 x 1.4 x 1.2 cm Composition: solid/almost completely solid (2) Echogenicity: isoechoic (1) Shape: not taller-than-wide (0) Margins: smooth (0) Echogenic foci: none (0) ACR TI-RADS total points: 3. ACR TI-RADS risk category:  TR3 (3 points). Significant change in size (>/= 20% in two dimensions and minimal increase of 2 mm): No Change in features: Yes - nodule no longer appears taller than wide Change  in ACR TI-RADS risk category: Yes currently classified as a TR3 nodule, previously a TR4 nodule ACR TI-RADS recommendations: *Given size (>/= 1.5 - 2.4 cm) and appearance, a follow-up ultrasound in 1 year should be considered based on TI-RADS criteria. _________________________________________________________ Nodule # 4 (previously labeled 7): Prior biopsy: No Location: Left; Inferior Maximum size: 1.6 cm; Other 2 dimensions: 1.6 x 1.5 cm, previously, 1.6 x 1.5 x 1.4 cm Composition: solid/almost completely solid (2) Echogenicity: isoechoic (1) Shape: not taller-than-wide (0) Margins: smooth (0) Echogenic foci: none (0) ACR TI-RADS total points: 3. ACR TI-RADS risk category:  TR3 (3 points). Significant change in size (>/=  20% in two dimensions and minimal increase of 2 mm): No Change in features: No Change in ACR TI-RADS risk category: No ACR TI-RADS recommendations: *Given size (>/= 1.5 - 2.4 cm) and appearance, a follow-up ultrasound in 1 year should be considered based on TI-RADS criteria. _________________________________________________________ The approximately 1.4 x 1.3 x 0.9 cm isoechoic nodule/pseudonodule within the superior pole the left lobe of the thyroid (labeled 5, previously labeled 9), is unchanged compared to the 04/2017 examination, previously, 1.3 x 1.2 x 0.8 cm, and again does not meet imaging criteria to recommend percutaneous sampling or continued dedicated follow-up. _________________________________________________________ Nodule # 6 (previously labeled 12): Prior biopsy: No Location: Left; Mid Maximum size: 1.6 cm; Other 2 dimensions: 1.0 x 0.9 cm, previously, 1.4 x 1.3 x 1.0 cm Composition: solid/almost completely solid (2) Echogenicity: isoechoic (1) Shape: not taller-than-wide (0) Margins: ill-defined (0) Echogenic foci: none (0) ACR TI-RADS total points: 3. ACR TI-RADS risk category:  TR3 (3 points). Significant change in size (>/= 20% in two dimensions and minimal increase of 2 mm): No  Change in features: No Change in ACR TI-RADS risk category: No ACR TI-RADS recommendations: *Given size (>/= 1.5 - 2.4 cm) and appearance, a follow-up ultrasound in 1 year should be considered based on TI-RADS criteria. _________________________________________________________ The approximately 1.2 x 0.9 x 0.6 cm nodule/pseudonodule within the inferior pole of the left lobe of the thyroid (labeled 7, previously labeled 12), is unchanged compared to the 04/2017 examination, previously, 1.1 x 0.9 x 0.6 cm again does not meet imaging criteria to recommend percutaneous sampling or continued dedicated follow-up. IMPRESSION: 1. Similar findings of multinodular goiter. No definitive new or enlarging thyroid nodules. 2. Nodules #1 and #3 have been down graded from TR4 nodules to TR3 nodules, however both still meet imaging criteria to recommend a 1 year follow-up as clinically indicated. 3. Nodules #2, 4 and 6 are unchanged however again all meet imaging criteria to recommend a 1 year follow-up. The above is in keeping with the ACR TI-RADS recommendations - J Am Coll Radiol 2017;14:587-595. Electronically Signed   By: Sandi Mariscal M.D.   On: 01/14/2018 16:04    ROS: 14 pt review of systems performed and negative (unless mentioned in an HPI)  Objective: BP 135/82 (BP Location: Left Arm, Patient Position: Sitting, Cuff Size: Normal)   Pulse 74   Temp 98.2 F (36.8 C) (Oral)   Resp 16   Ht '5\' 4"'$  (1.626 m)   Wt 151 lb (68.5 kg)   SpO2 96%   BMI 25.92 kg/m  Gen: Afebrile. No acute distress. Nontoxic in appearance, well-developed, well-nourished, pleasant, Caucasian female, mildly overweight. HENT: AT. [place]. Bilateral TM visualized and normal in appearance, normal external auditory canal. MMM, no oral lesions, adequate dentition. Bilateral nares within normal limits. Throat without erythema, ulcerations or exudates.  No cough on exam, no hoarseness on exam. Eyes:Pupils Equal Round Reactive to light, Extraocular  movements intact,  Conjunctiva without redness, discharge or icterus. Neck/lymp/endocrine: Supple, no lymphadenopathy, right thyromegaly CV: RRR no murmur, no edema, +2/4 P posterior tibialis pulses.  No carotid bruits. No JVD. Chest: CTAB, no wheeze, rhonchi or crackles.  Normal respiratory effort.  Good air movement. Abd: Soft.  Flat. NTND. BS present.  No masses palpated. No hepatosplenomegaly. No rebound tenderness or guarding. Skin: No rashes, purpura or petechiae. Warm and well-perfused. Skin intact. Neuro/Msk:  Normal gait. PERLA. EOMi. Alert. Oriented x3.  Cranial nerves II through XII intact. Muscle strength 5/5 upper/lower extremity. DTRs equal bilaterally.  Psych: Normal affect, dress and demeanor. Normal speech. Normal thought content and judgment.  No exam data present  Assessment/plan: Olivia [name] is a 58 y.o. female present for CPE. Overweight (BMI 25.0-29.9) Dietary modifications and routine exercise encouraged - Lipid panel Malignant neoplasm of upper-inner quadrant of right breast in female, estrogen receptor positive (HCC)/Acquired absence of bilateral breasts and nipples Follows with oncology, on femara.  Has double mastectomy. - CBC - Comprehensive metabolic panel Multiple thyroid nodules/Hypothyroidism, unspecified type -Due for repeat ultrasound January 2021, follows with Dr. Dwyane Dee endocrinology - CBC - Comprehensive metabolic panel - TSH Osteoporosis, unspecified osteoporosis type, unspecified pathological fracture presence DEXA due next physical Continue Prolia injections - Vitamin D (25 hydroxy) Diabetes mellitus screening - Hemoglobin A1c Arthritis Right ankle arthritis, Mobic refilled for her. Encounter for preventive health examination Patient was encouraged to exercise greater than 150 minutes a week. Patient was encouraged to choose a diet filled with fresh fruits and vegetables, and lean meats. AVS provided to patient today for  education/recommendation on gender specific health and safety maintenance. Colonoscopy: completed by Dr. Sharlett Iles January 2014. Ten-year follow-up. Mammogram:  Double mastectomy 09/30/2016, patient being treated for recurrent breast cancer. Cervical cancer screening: last pap: History of hysterectomy, pelvic exams completed by Dr. Grayling Congress. Immunizations: tdap UTD 2013, Influenza UTD 2019(encouraged yearly). Shingrix series completed. Infectious disease screening: HIV and  Hep C completed DEXA: History of osteoporosis, on prolia injections every 6 months. Last DEXA scan 02/02/2017 improving -2.0. Follow-up every 2 years  Return in about 1 year (around 04/22/2019) for CPE.  Electronically signed by: Howard Pouch, DO [place]

## 2018-04-22 NOTE — Patient Instructions (Signed)
I have refilled your meds for you today.  Monitor your blood pressure if over 135/85 routinely--> make an appt to discuss.     Health Maintenance, Female Adopting a healthy lifestyle and getting preventive care can go a long way to promote health and wellness. Talk with your health care provider about what schedule of regular examinations is right for you. This is a good chance for you to check in with your provider about disease prevention and staying healthy. In between checkups, there are plenty of things you can do on your own. Experts have done a lot of research about which lifestyle changes and preventive measures are most likely to keep you healthy. Ask your health care provider for more information. Weight and diet Eat a healthy diet  Be sure to include plenty of vegetables, fruits, low-fat dairy products, and lean protein.  Do not eat a lot of foods high in solid fats, added sugars, or salt.  Get regular exercise. This is one of the most important things you can do for your health. ? Most adults should exercise for at least 150 minutes each week. The exercise should increase your heart rate and make you sweat (moderate-intensity exercise). ? Most adults should also do strengthening exercises at least twice a week. This is in addition to the moderate-intensity exercise. Maintain a healthy weight  Body mass index (BMI) is a measurement that can be used to identify possible weight problems. It estimates body fat based on height and weight. Your health care provider can help determine your BMI and help you achieve or maintain a healthy weight.  For females 19 years of age and older: ? A BMI below 18.5 is considered underweight. ? A BMI of 18.5 to 24.9 is normal. ? A BMI of 25 to 29.9 is considered overweight. ? A BMI of 30 and above is considered obese. Watch levels of cholesterol and blood lipids  You should start having your blood tested for lipids and cholesterol at 57 years of  age, then have this test every 5 years.  You may need to have your cholesterol levels checked more often if: ? Your lipid or cholesterol levels are high. ? You are older than 57 years of age. ? You are at high risk for heart disease. Cancer screening Lung Cancer  Lung cancer screening is recommended for adults 32-75 years old who are at high risk for lung cancer because of a history of smoking.  A yearly low-dose CT scan of the lungs is recommended for people who: ? Currently smoke. ? Have quit within the past 15 years. ? Have at least a 30-pack-year history of smoking. A pack year is smoking an average of one pack of cigarettes a day for 1 year.  Yearly screening should continue until it has been 15 years since you quit.  Yearly screening should stop if you develop a health problem that would prevent you from having lung cancer treatment. Breast Cancer  Practice breast self-awareness. This means understanding how your breasts normally appear and feel.  It also means doing regular breast self-exams. Let your health care provider know about any changes, no matter how small.  If you are in your 20s or 30s, you should have a clinical breast exam (CBE) by a health care provider every 1-3 years as part of a regular health exam.  If you are 75 or older, have a CBE every year. Also consider having a breast X-ray (mammogram) every year.  If you have  a family history of breast cancer, talk to your health care provider about genetic screening.  If you are at high risk for breast cancer, talk to your health care provider about having an MRI and a mammogram every year.  Breast cancer gene (BRCA) assessment is recommended for women who have family members with BRCA-related cancers. BRCA-related cancers include: ? Breast. ? Ovarian. ? Tubal. ? Peritoneal cancers.  Results of the assessment will determine the need for genetic counseling and BRCA1 and BRCA2 testing. Cervical Cancer Your  health care provider may recommend that you be screened regularly for cancer of the pelvic organs (ovaries, uterus, and vagina). This screening involves a pelvic examination, including checking for microscopic changes to the surface of your cervix (Pap test). You may be encouraged to have this screening done every 3 years, beginning at age 31.  For women ages 31-65, health care providers may recommend pelvic exams and Pap testing every 3 years, or they may recommend the Pap and pelvic exam, combined with testing for human papilloma virus (HPV), every 5 years. Some types of HPV increase your risk of cervical cancer. Testing for HPV may also be done on women of any age with unclear Pap test results.  Other health care providers may not recommend any screening for nonpregnant women who are considered low risk for pelvic cancer and who do not have symptoms. Ask your health care provider if a screening pelvic exam is right for you.  If you have had past treatment for cervical cancer or a condition that could lead to cancer, you need Pap tests and screening for cancer for at least 20 years after your treatment. If Pap tests have been discontinued, your risk factors (such as having a new sexual partner) need to be reassessed to determine if screening should resume. Some women have medical problems that increase the chance of getting cervical cancer. In these cases, your health care provider may recommend more frequent screening and Pap tests. Colorectal Cancer  This type of cancer can be detected and often prevented.  Routine colorectal cancer screening usually begins at 57 years of age and continues through 57 years of age.  Your health care provider may recommend screening at an earlier age if you have risk factors for colon cancer.  Your health care provider may also recommend using home test kits to check for hidden blood in the stool.  A small camera at the end of a tube can be used to examine your  colon directly (sigmoidoscopy or colonoscopy). This is done to check for the earliest forms of colorectal cancer.  Routine screening usually begins at age 11.  Direct examination of the colon should be repeated every 5-10 years through 57 years of age. However, you may need to be screened more often if early forms of precancerous polyps or small growths are found. Skin Cancer  Check your skin from head to toe regularly.  Tell your health care provider about any new moles or changes in moles, especially if there is a change in a mole's shape or color.  Also tell your health care provider if you have a mole that is larger than the size of a pencil eraser.  Always use sunscreen. Apply sunscreen liberally and repeatedly throughout the day.  Protect yourself by wearing long sleeves, pants, a wide-brimmed hat, and sunglasses whenever you are outside. Heart disease, diabetes, and high blood pressure  High blood pressure causes heart disease and increases the risk of stroke. High blood  pressure is more likely to develop in: ? People who have blood pressure in the high end of the normal range (130-139/85-89 mm Hg). ? People who are overweight or obese. ? People who are African American.  If you are 25-52 years of age, have your blood pressure checked every 3-5 years. If you are 31 years of age or older, have your blood pressure checked every year. You should have your blood pressure measured twice-once when you are at a hospital or clinic, and once when you are not at a hospital or clinic. Record the average of the two measurements. To check your blood pressure when you are not at a hospital or clinic, you can use: ? An automated blood pressure machine at a pharmacy. ? A home blood pressure monitor.  If you are between 4 years and 66 years old, ask your health care provider if you should take aspirin to prevent strokes.  Have regular diabetes screenings. This involves taking a blood sample to  check your fasting blood sugar level. ? If you are at a normal weight and have a low risk for diabetes, have this test once every three years after 57 years of age. ? If you are overweight and have a high risk for diabetes, consider being tested at a younger age or more often. Preventing infection Hepatitis B  If you have a higher risk for hepatitis B, you should be screened for this virus. You are considered at high risk for hepatitis B if: ? You were born in a country where hepatitis B is common. Ask your health care provider which countries are considered high risk. ? Your parents were born in a high-risk country, and you have not been immunized against hepatitis B (hepatitis B vaccine). ? You have HIV or AIDS. ? You use needles to inject street drugs. ? You live with someone who has hepatitis B. ? You have had sex with someone who has hepatitis B. ? You get hemodialysis treatment. ? You take certain medicines for conditions, including cancer, organ transplantation, and autoimmune conditions. Hepatitis C  Blood testing is recommended for: ? Everyone born from [birth year] through 1964. ? Anyone with known risk factors for hepatitis C. Sexually transmitted infections (STIs)  You should be screened for sexually transmitted infections (STIs) including gonorrhea and chlamydia if: ? You are sexually active and are younger than 57 years of age. ? You are older than 57 years of age and your health care provider tells you that you are at risk for this type of infection. ? Your sexual activity has changed since you were last screened and you are at an increased risk for chlamydia or gonorrhea. Ask your health care provider if you are at risk.  If you do not have HIV, but are at risk, it may be recommended that you take a prescription medicine daily to prevent HIV infection. This is called pre-exposure prophylaxis (PrEP). You are considered at risk if: ? You are sexually active and do not regularly use  condoms or know the HIV status of your partner(s). ? You take drugs by injection. ? You are sexually active with a partner who has HIV. Talk with your health care provider about whether you are at high risk of being infected with HIV. If you choose to begin PrEP, you should first be tested for HIV. You should then be tested every 3 months for as long as you are taking PrEP. Pregnancy  If you are premenopausal and you may  become pregnant, ask your health care provider about preconception counseling.  If you may become pregnant, take 400 to 800 micrograms (mcg) of folic acid every day.  If you want to prevent pregnancy, talk to your health care provider about birth control (contraception). Osteoporosis and menopause  Osteoporosis is a disease in which the bones lose minerals and strength with aging. This can result in serious bone fractures. Your risk for osteoporosis can be identified using a bone density scan.  If you are 53 years of age or older, or if you are at risk for osteoporosis and fractures, ask your health care provider if you should be screened.  Ask your health care provider whether you should take a calcium or vitamin D supplement to lower your risk for osteoporosis.  Menopause may have certain physical symptoms and risks.  Hormone replacement therapy may reduce some of these symptoms and risks. Talk to your health care provider about whether hormone replacement therapy is right for you. Follow these instructions at home:  Schedule regular health, dental, and eye exams.  Stay current with your immunizations.  Do not use any tobacco products including cigarettes, chewing tobacco, or electronic cigarettes.  If you are pregnant, do not drink alcohol.  If you are breastfeeding, limit how much and how often you drink alcohol.  Limit alcohol intake to no more than 1 drink per day for nonpregnant women. One drink equals 12 ounces of beer, 5 ounces of wine, or 1 ounces of  hard liquor.  Do not use street drugs.  Do not share needles.  Ask your health care provider for help if you need support or information about quitting drugs.  Tell your health care provider if you often feel depressed.  Tell your health care provider if you have ever been abused or do not feel safe at home. This information is not intended to replace advice given to you by your health care provider. Make sure you discuss any questions you have with your health care provider. Document Released: 08/05/2010 Document Revised: 06/28/2015 Document Reviewed: 10/24/2014 Elsevier Interactive Patient Education  2019 [employer].

## 2018-04-23 ENCOUNTER — Telehealth: Payer: Self-pay | Admitting: Family Medicine

## 2018-04-23 DIAGNOSIS — E782 Mixed hyperlipidemia: Secondary | ICD-10-CM

## 2018-04-23 MED ORDER — ATORVASTATIN CALCIUM 20 MG PO TABS: 20.0000 mg | ORAL_TABLET | Freq: Every day | ORAL | 3 refills | Status: DC

## 2018-04-23 NOTE — Telephone Encounter (Signed)
Pt was called and given detailed lab results. Pt verbalized understanding and wanted to call back to schedule F/U appt when she starts medication

## 2018-04-23 NOTE — Telephone Encounter (Signed)
Please inform patient the following information: Her labs are all normal- with exception of her cholesterol is rather high. Total cholesterol 264 (goal < 200), LDL 174 (goal < 130) and tg 161 (goal < 150).      - routine exercise, diet higher in fiber--> lower in saturated fats. Mediterranean  diet is a good option AND starting a statin to help lower her cholesterol nad provided her CV protection from heart attack/stroke-- especially  with her family history.  Her vit d is mildly low at 27- increase her dose by 600-800 units a day to what she is currently taking.    I have called in atorvastatin for her start--> follow up in 3 months w/ provider and repeat fasting labs.

## 2018-04-23 NOTE — Progress Notes (Signed)
Pt with osteoporosis. Agree with q42mo prolia injections. Signed:  Crissie Sickles, MD           04/23/2018

## 2018-05-19 ENCOUNTER — Telehealth: Payer: Self-pay | Admitting: Hematology

## 2018-05-19 NOTE — Telephone Encounter (Signed)
Changed 4/30 appt to telephone visit per sch msg. Called patient. No answer. Left message explaining changes and cancelled lab

## 2018-05-20 NOTE — Progress Notes (Signed)
Pt with osteoporosis. Agree with q6mo prolia injections. Signed:  Crissie Sickles, MD           05/20/2018

## 2018-05-28 ENCOUNTER — Other Ambulatory Visit: Payer: BLUE CROSS/BLUE SHIELD

## 2018-05-28 ENCOUNTER — Ambulatory Visit: Payer: BLUE CROSS/BLUE SHIELD | Admitting: Hematology

## 2018-05-31 NOTE — Progress Notes (Signed)
[facility]   [phone]) [phone] [phone]   Clinic Follow up Note   Patient Care Team: Ma Hillock, DO as PCP - General (Family Medicine) Truitt Merle, MD as Consulting Physician (Hematology) Jovita Kussmaul, MD as Consulting Physician (General Surgery) Gery Pray, MD as Consulting Physician (Radiation Oncology) Sable Feil, MD as Consulting Physician (Gastroenterology) Molli Posey, MD as Consulting Physician (Obstetrics and Gynecology) Delice Bison Charlestine Massed, NP as Nurse Practitioner (Hematology and Oncology) Elayne Snare, MD as Consulting Physician (Endocrinology)   I connected with Collier Flowers on 06/03/2018 at  2:30 PM EDT by telephone visit and verified that I am speaking with the correct person using two identifiers.  I discussed the limitations, risks, security and privacy concerns of performing an evaluation and management service by telephone and the availability of in person appointments. I also discussed with the patient that there may be a patient responsible charge related to this service. The patient expressed understanding and agreed to proceed.   Patient's location:  Her home  Provider's location:  My Office  CHIEF COMPLAINT: F/u of right breast cancer   SUMMARY OF ONCOLOGIC HISTORY: Oncology History   Cancer Staging Malignant neoplasm of upper-inner quadrant of right breast in female, estrogen receptor positive ([place]) Staging form: Breast, AJCC 8th Edition - Clinical stage from 10/09/2016: Stage IA (cT1c, cN0, cM0, G3, ER: Positive, PR: Positive, HER2: Negative) - Signed by Truitt Merle, MD on 10/14/2016 - Pathologic stage from 11/17/2016: Stage IB (pT2, pN0, cM0, G3, ER: Positive, PR: Positive, HER2: Negative, Oncotype DX score: 21) - Signed by Truitt Merle, MD on 12/11/2016       Malignant neoplasm of upper-inner quadrant of right breast in female, estrogen receptor positive ([place])   10/08/2016 Mammogram    Diagnostic mammogram  and ultrasound showed an irregular mass in the right breast at 1 o'clock, 5 cm from the nipple measuring 1.6 x 1.4x 1.6 cm, axilla was negative for adenopathy.    10/09/2016 Receptors her2    Estrogen Receptor: 80%, POSITIVE, STRONG STAINING INTENSITY Progesterone Receptor: 30%, POSITIVE, STRONG STAINING INTENSITY Proliferation Marker Ki67: 35% HER2 NEGATIVE     10/09/2016 Initial Diagnosis    Malignant neoplasm of upper-inner quadrant of right breast in female, estrogen receptor positive ([place])    10/09/2016 Initial Biopsy    Diagnosis Breast, right, needle core biopsy, upper inner quadrant, 1:00 o'clock - INVASIVE DUCTAL CARCINOMA, G3    10/2016 Genetic Testing    Genetic testing was negative for The genes analyzed were the 23 genes on [facility]'s Breast/GYN panel (ATM, BARD1, BRCA1, BRCA2, BRIP1, CDH1, CHEK2, DICER1, EPCAM, MLH1,  MSH2, MSH6, NBN, NF1, PALB2, PMS2, PTEN, RAD50, RAD51C, RAD51D,SMARCA4, STK11, and TP53).      11/17/2016 Surgery    RIGHT MASTECTOMY AND LEFT PROPHYLATIC MASTECTOMY by Dr. Marlou Starks  IMEDITATE BREAST RECONSTRUCTION WITH PLACEMENT OF TISSUE EXPANDER AND FLEX HD (ACELLULAR HYDRATED DERMIS) by Dr. Marla Roe      11/17/2016 Pathology Results    Diagnosis 11/17/16 1. Breast, simple mastectomy, Left - BENIGN BREAST TISSUE WITH ECTATIC DUCTS. - ONE OF ONE LYMPH NODE NEGATIVE FOR CARCINOMA (0/1). 2. Breast, simple mastectomy, Right - INVASIVE DUCTAL CARCINOMA, GRADE 3, SPANNING 2.4 CM. - HIGH GRADE DUCTAL CARCINOMA IN SITU. - INVASIVE CARCINOMA IS <0.1 CM OF THE POSTERIOR MARGIN (BROADLY) AND ANTERIOR MARGIN (FOCALLY). - IN SITU CARCINOMA IS >0.2 CM OF BOTH MARGINS. - PRIOR LUMPECTOMY SITE, NEGATIVE FOR CARCINOMA. - SEE ONCOLOGY TABLE. 3. Breast, excision, Superior flap deep  margin - BENIGN FIBROADIPOSE TISSUE.    11/17/2016 Miscellaneous    Oncotype on 11/17/16 Rcurence Risk Score of 21 10-Year risk of distant reucrrence with Tamoxifen alone is 13%      12/18/2016 -  Anti-estrogen oral therapy    Letrozole 2.5 mg daily; began 12/18/16    12/24/2016 Imaging    BONE SCAN IMPRESSION: No evidence of osseous metastatic disease.    12/24/2016 Imaging    CT CAP IMPRESSION: 1. Status post bilateral modified radical mastectomy and breast reconstruction, as above. No findings to suggest metastatic disease in the chest, abdomen or pelvis. 2. Multiple heterogeneous appearing thyroid nodules, as discussed above. These are nonspecific but further evaluation with nonemergent thyroid ultrasound is recommended in the near future to determine any potential need for further evaluation with fine-needle aspiration. 3. Incidental findings, as above.    02/11/2017 Surgery    REMOVAL OF BILATERAL TISSUE EXPANDERS WITH PLACEMENT OF BILATERAL SILICONE BREAST IMPLANTS by Dr. Marla Roe       CURRENT THERAPY:  1. Adjuvant Letrozole2.5 mg daily; began11/15/18 2. Prolia injections every 6 months since 2016 through her PCP   INTERVAL HISTORY:  Olivia [name] is here for a follow up of right breast cancer. She was able to identify herself by birth date. She notes she is doing well. Although she is not completley satisfied with her breast reconstruction. She is considering if she wants any further reconstruction.  She notes her last check-up with PCP in March she had elevated cholesterol. She will f/u in June. She notes she tries to remain active and exercise.  She notes she has not been taking Vitamin D and calcium in the same pot. She is on Prolia injection with her PCP. She notes stiffness in her knees, hips and shoulders in the morning, she denies any pain. This will improve during the day but limits her yoga movements. She notes she does run hot at night, but this does not effect her sleep.    REVIEW OF SYSTEMS:   Constitutional: Denies fevers, chills or abnormal weight loss (+) very mild hot flashes  Eyes: Denies blurriness of vision Ears, nose, mouth,  throat, and face: Denies mucositis or sore throat Respiratory: Denies cough, dyspnea or wheezes Cardiovascular: Denies palpitation, chest discomfort or lower extremity swelling Gastrointestinal:  Denies nausea, heartburn or change in bowel habits Skin: Denies abnormal skin rashes MSK: (+) Joint stiffness  Lymphatics: Denies new lymphadenopathy or easy bruising Neurological:Denies numbness, tingling or new weaknesses Behavioral/Psych: Mood is stable, no new changes  All other systems were reviewed with the patient and are negative.  MEDICAL HISTORY:  Past Medical History:  Diagnosis Date  . Breast cancer ([place]) 2018   Lumpectomy, chemotherapy and radiation with recurrence 2018. Now on Femara  . Family history of breast cancer   . Genetic testing 10/30/2016   Breast/GYN panel (23 genes) @ [facility] - No pathogenic mutations detected  . GERD (gastroesophageal reflux disease)    OTC medication  . Heart murmur    as a child - Echo in early 20s "ok"  . History of echocardiogram 05/2015   Echo 4/17: EF 55-60%, GLS -19.6%, normal wall motion  . History of exercise stress test 05/2015   ETT 4/17 - No ST changes  . HLD (hyperlipidemia)   . Multiple thyroid nodules 2009   Multiple nodules: Normal ultrasound reported at diagnosis  . Osteoporosis   . Personal history of chemotherapy   . Personal history of radiation therapy  SURGICAL HISTORY: Past Surgical History:  Procedure Laterality Date  . BREAST LUMPECTOMY Right 2003  . BREAST RECONSTRUCTION WITH PLACEMENT OF TISSUE EXPANDER AND FLEX HD (ACELLULAR HYDRATED DERMIS) Bilateral 11/17/2016   Procedure: IMEDITATE BREAST RECONSTRUCTION WITH PLACEMENT OF TISSUE EXPANDER AND FLEX HD (ACELLULAR HYDRATED DERMIS);  Surgeon: Wallace Going, DO;  Location: [facility];  Service: Plastics;  Laterality: Bilateral;  . BREAST SURGERY  2003 /right   lumpectomy; chemotherapy and radiation  . REMOVAL OF BILATERAL TISSUE EXPANDERS WITH PLACEMENT OF  BILATERAL BREAST IMPLANTS Bilateral 02/11/2017   Procedure: REMOVAL OF BILATERAL TISSUE EXPANDERS WITH PLACEMENT OF BILATERAL SILICONE BREAST IMPLANTS;  Surgeon: Wallace Going, DO;  Location: [facility];  Service: Plastics;  Laterality: Bilateral;  . TOTAL ABDOMINAL HYSTERECTOMY W/ BILATERAL SALPINGOOPHORECTOMY     for treatment of breast treatment  . TOTAL MASTECTOMY Bilateral 11/17/2016   Procedure: RIGHT MASTECTOMY AND LEFT PROPHYLATIC MASTECTOMY;  Surgeon: Jovita Kussmaul, MD;  Location: [facility];  Service: General;  Laterality: Bilateral;    I have reviewed the social history and family history with the patient and they are unchanged from previous note.  ALLERGIES:  has No Known Allergies.  MEDICATIONS:  Current Outpatient Medications  Medication Sig Dispense Refill  . atorvastatin (LIPITOR) 20 MG tablet Take 1 tablet (20 mg total) by mouth daily. 90 tablet 3  . Calcium 500 MG CHEW Chew by mouth.    . denosumab (PROLIA) 60 MG/ML SOLN injection Inject 60 mg into the skin every 6 (six) months. Administer in upper arm, thigh, or abdomen 1 Syringe 1  . letrozole (FEMARA) 2.5 MG tablet TAKE 1 TABLET BY MOUTH EVERY DAY 90 tablet 3  . meloxicam (MOBIC) 15 MG tablet Take 1 tablet (15 mg total) by mouth daily. 90 tablet 3   No current facility-administered medications for this visit.     PHYSICAL EXAMINATION: ECOG PERFORMANCE STATUS: 0 - Asymptomatic  No vitals taken today, Exam not performed today  LABORATORY DATA:  I have reviewed the data as listed CBC Latest Ref Rng & Units 04/22/2018 11/26/2017 03/26/2017  WBC 4.0 - 10.5 K/uL 6.1 4.9 4.2  Hemoglobin 12.0 - 15.0 g/dL 15.0 14.8 13.8  Hematocrit 36.0 - 46.0 % 44.7 44.2 41.1  Platelets 150.0 - 400.0 K/uL 287.0 250 218     CMP Latest Ref Rng & Units 04/22/2018 11/26/2017 03/26/2017  Glucose 70 - 99 mg/dL 100(H) 86 89  BUN 6 - 23 mg/dL '21 16 16  '$ Creatinine 0.40 - 1.20 mg/dL 0.80 1.04(H) 0.90  Sodium 135 - 145 mEq/L  139 140 142  Potassium 3.5 - 5.1 mEq/L 4.4 4.5 4.4  Chloride 96 - 112 mEq/L 102 104 106  CO2 19 - 32 mEq/L '28 27 27  '$ Calcium 8.4 - 10.5 mg/dL 9.8 9.5 9.7  Total Protein 6.0 - 8.3 g/dL 7.4 7.5 7.1  Total Bilirubin 0.2 - 1.2 mg/dL 0.6 0.5 0.4  Alkaline Phos 39 - 117 U/L 53 55 54  AST 0 - 37 U/L 22 32 24  ALT 0 - 35 U/L 25 37 29      RADIOGRAPHIC STUDIES: I have personally reviewed the radiological images as listed and agreed with the findings in the report. No results found.   ASSESSMENT & PLAN:  Olivia [name] is a 58 y.o. female with   1. Malignant neoplasm of upper-inner quadrant of right breast, invasive ductal carcinoma, pT2N0M0, Stage 1B, ER/PR: positive, HER2 negative, Grade 3, Oncotype RS 21 -She was diagnosed  in 10/2016. She is s/p b/l mastectomy and reconstruction.  -Her pathology results showed a 2.3cm tumor, ER/PR positive, HER2 negative, node negative disease, Oncotype score is 21, intermediate risk. According to the most recent published Tailor X study result,there is no clinical benefit for adjuvant chemotherapy if OncotypeRS 11-25,especially in women age of 69 or above. So I do not recommend adjuvant chemotherapy. -She had node-negative mastectomy, therefore will not require adjuvant radiation -Had b/l mastectomy and does not need any mammograms. Will continue regular breast exams. -She started Letrozole on 12/18/16. Tolerating well with mild hot flashes and joint stiffness. Plan to continue for 5-7 years.  -She clinically is doing well. Her latest f/u with PCP showed elevated cholesterol. I discussed letrozole can cause to weight gain and encouraged her to remain active and eat healthy balanced diet.  -She plans to have f/u cholesterol check with PCP in June, I will have her complete her labs with him and then f/u with me in 3 months.   2. History of Right Breast Cancer, pT2N1M0, ER+/pr+/her2-, in 2003 -Treated by Dr. Eston Esters -s/p lumpectomy and axillary lymph  node dissection (3/7 positive lymph nodes), chemo, radiation, tamoxifen for 2 years and Anastrozole for 3 years -total hysterectomy/BSO in 2006   3. Genetic testing did not reveal any pathogenic mutation in any of these genes.  4. Osteopenia  -After being on Arimidex for 3 years she developed osteoporosis.  -She was previously on Fosamax for 5 years. Now she is on Prolia injection for the past 2 years. She receives injections every 6 months through her PCP -02/02/17 DEXA showed significant improvement from osteoporosis to osteopenia. Lowest density at Right Femur Neck with a T-Score of -2 -She will continue Prolia Injections every 6 months through her PCP. Next DEXA in 01/2019 -I strongly encourage her totake both calcium, vitamin D and exercise.    5. Thyroid Nodules -12/24/16 CT Chest identifies multiple heterogenous appearing thyroid nodules. -Previous CT chest in 2004 noted bilateral thyroid nodules. Does not appear she has had dedicated thyroid US.  -She reports she had a goiter and was on synthroid >15 years ago but TSH level normalized with diet and exercise and she was weaned off medication.  -Her thyroid is prominent with R side nodularity on 01/22/17 exam.  -I will defer to her PCP Dr. Raoul Pitch for further evaluation and management.    PLAN: -Continue Letrozole daily  -f/u with me in 3 months, she will have lab with PCP in 2-3 month, I will contact Dr. Raoul Pitch to include CBC and CMP in her lab work    No problem-specific [place] notes found for this encounter.   No orders of the defined types were placed in this encounter.  I discussed the assessment and treatment plan with the patient. The patient was provided an opportunity to ask questions and all were answered. The patient agreed with the plan and demonstrated an understanding of the instructions.  The patient was advised to call back or seek an in-person evaluation if the symptoms worsen or if the  condition fails to improve as anticipated.  I provided 15 minutes of non face-to-face telephone visit time during this encounter, and > 50% was spent counseling as documented under my assessment & plan.    Truitt Merle, MD 06/03/2018   I, Joslyn Devon, am acting as scribe for Truitt Merle, MD.   I have reviewed the above documentation for accuracy and completeness, and I agree with the above.

## 2018-06-03 ENCOUNTER — Telehealth: Payer: Self-pay | Admitting: Hematology

## 2018-06-03 ENCOUNTER — Encounter: Payer: Self-pay | Admitting: Hematology

## 2018-06-03 ENCOUNTER — Other Ambulatory Visit: Payer: BLUE CROSS/BLUE SHIELD

## 2018-06-03 ENCOUNTER — Inpatient Hospital Stay: Payer: BLUE CROSS/BLUE SHIELD | Attending: Hematology | Admitting: Hematology

## 2018-06-03 DIAGNOSIS — M810 Age-related osteoporosis without current pathological fracture: Secondary | ICD-10-CM

## 2018-06-03 DIAGNOSIS — Z170 Estrogen receptor positive status [ER+]: Secondary | ICD-10-CM | POA: Diagnosis not present

## 2018-06-03 DIAGNOSIS — M8580 Other specified disorders of bone density and structure, unspecified site: Secondary | ICD-10-CM

## 2018-06-03 DIAGNOSIS — Z9013 Acquired absence of bilateral breasts and nipples: Secondary | ICD-10-CM

## 2018-06-03 DIAGNOSIS — C50211 Malignant neoplasm of upper-inner quadrant of right female breast: Secondary | ICD-10-CM | POA: Diagnosis not present

## 2018-06-03 DIAGNOSIS — Z79811 Long term (current) use of aromatase inhibitors: Secondary | ICD-10-CM

## 2018-06-03 DIAGNOSIS — Z9221 Personal history of antineoplastic chemotherapy: Secondary | ICD-10-CM

## 2018-06-03 DIAGNOSIS — Z79899 Other long term (current) drug therapy: Secondary | ICD-10-CM

## 2018-06-03 DIAGNOSIS — Z923 Personal history of irradiation: Secondary | ICD-10-CM

## 2018-06-03 DIAGNOSIS — E041 Nontoxic single thyroid nodule: Secondary | ICD-10-CM

## 2018-06-03 NOTE — Telephone Encounter (Signed)
Scheduled appt per 4/30 los. ° °A calendar will be mailed out. °

## 2018-06-04 ENCOUNTER — Telehealth: Payer: Self-pay | Admitting: Hematology

## 2018-06-04 NOTE — Telephone Encounter (Signed)
Cancelled lab for 7/30 per sch msg.

## 2018-06-30 DIAGNOSIS — Z170 Estrogen receptor positive status [ER+]: Secondary | ICD-10-CM | POA: Diagnosis not present

## 2018-06-30 DIAGNOSIS — C50211 Malignant neoplasm of upper-inner quadrant of right female breast: Secondary | ICD-10-CM | POA: Diagnosis not present

## 2018-07-01 NOTE — Progress Notes (Signed)
Pt with osteoporosis. I agree with prolia injection today. Signed:  Crissie Sickles, MD           07/01/2018

## 2018-07-19 DIAGNOSIS — Z6825 Body mass index (BMI) 25.0-25.9, adult: Secondary | ICD-10-CM | POA: Diagnosis not present

## 2018-07-19 DIAGNOSIS — Z01419 Encounter for gynecological examination (general) (routine) without abnormal findings: Secondary | ICD-10-CM | POA: Diagnosis not present

## 2018-07-29 ENCOUNTER — Other Ambulatory Visit: Payer: Self-pay

## 2018-07-29 ENCOUNTER — Ambulatory Visit (INDEPENDENT_AMBULATORY_CARE_PROVIDER_SITE_OTHER): Payer: BC Managed Care – PPO | Admitting: Family Medicine

## 2018-07-29 ENCOUNTER — Encounter: Payer: Self-pay | Admitting: Family Medicine

## 2018-07-29 VITALS — BP 108/75 | HR 71 | Temp 97.4°F | Resp 18 | Ht 64.0 in | Wt 146.0 lb

## 2018-07-29 DIAGNOSIS — C50211 Malignant neoplasm of upper-inner quadrant of right female breast: Secondary | ICD-10-CM

## 2018-07-29 DIAGNOSIS — Z170 Estrogen receptor positive status [ER+]: Secondary | ICD-10-CM | POA: Diagnosis not present

## 2018-07-29 DIAGNOSIS — E782 Mixed hyperlipidemia: Secondary | ICD-10-CM

## 2018-07-29 LAB — CBC WITH DIFFERENTIAL/PLATELET
Basophils Absolute: 0.1 10*3/uL (ref 0.0–0.1)
Basophils Relative: 1.3 % (ref 0.0–3.0)
Eosinophils Absolute: 0.1 10*3/uL (ref 0.0–0.7)
Eosinophils Relative: 1.1 % (ref 0.0–5.0)
HCT: 43.7 % (ref 36.0–46.0)
Hemoglobin: 14.4 g/dL (ref 12.0–15.0)
Lymphocytes Relative: 30 % (ref 12.0–46.0)
Lymphs Abs: 1.4 10*3/uL (ref 0.7–4.0)
MCHC: 33 g/dL (ref 30.0–36.0)
MCV: 87.8 fl (ref 78.0–100.0)
Monocytes Absolute: 0.3 10*3/uL (ref 0.1–1.0)
Monocytes Relative: 7.3 % (ref 3.0–12.0)
Neutro Abs: 2.8 10*3/uL (ref 1.4–7.7)
Neutrophils Relative %: 60.3 % (ref 43.0–77.0)
Platelets: 257 10*3/uL (ref 150.0–400.0)
RBC: 4.98 Mil/uL (ref 3.87–5.11)
RDW: 14.2 % (ref 11.5–15.5)
WBC: 4.7 10*3/uL (ref 4.0–10.5)

## 2018-07-29 LAB — COMPREHENSIVE METABOLIC PANEL
ALT: 33 U/L (ref 0–35)
AST: 25 U/L (ref 0–37)
Albumin: 4.9 g/dL (ref 3.5–5.2)
Alkaline Phosphatase: 49 U/L (ref 39–117)
BUN: 20 mg/dL (ref 6–23)
CO2: 27 mEq/L (ref 19–32)
Calcium: 9.4 mg/dL (ref 8.4–10.5)
Chloride: 106 mEq/L (ref 96–112)
Creatinine, Ser: 0.85 mg/dL (ref 0.40–1.20)
GFR: 68.91 mL/min (ref >60.00)
Glucose, Bld: 97 mg/dL (ref 70–99)
Potassium: 4.8 mEq/L (ref 3.5–5.1)
Sodium: 142 mEq/L (ref 135–145)
Total Bilirubin: 0.7 mg/dL (ref 0.2–1.2)
Total Protein: 7 g/dL (ref 6.0–8.3)

## 2018-07-29 LAB — LIPID PANEL
Cholesterol: 134 mg/dL (ref 0–200)
HDL: 51 mg/dL (ref >39.00)
LDL Cholesterol: 67 mg/dL (ref 0–99)
NonHDL: 83.44
Total CHOL/HDL Ratio: 3
Triglycerides: 83 mg/dL (ref 0.0–149.0)
VLDL: 16.6 mg/dL (ref 0.0–40.0)

## 2018-07-29 NOTE — Progress Notes (Signed)
Olivia [name] , 1960-09-11, 58 y.o., female MRN: [id number] Patient Care Team    Relationship Specialty Notifications Start End  Ma Hillock, DO PCP - General Family Medicine  12/26/14   Truitt Merle, MD Consulting Physician Hematology  10/15/16   Jovita Kussmaul, MD Consulting Physician General Surgery  10/15/16   Gery Pray, MD Consulting Physician Radiation Oncology  10/15/16   Sable Feil, MD Consulting Physician Gastroenterology  04/14/17   Molli Posey, MD Consulting Physician Obstetrics and Gynecology  04/14/17   Gardenia Phlegm, NP Nurse Practitioner Hematology and Oncology  07/08/17   Elayne Snare, MD Consulting Physician Endocrinology  04/22/18    Comment: thyroid nodules    Chief Complaint  Patient presents with  . Hyperlipidemia    Pt is taking medications and no side effects reported      Subjective: Olivia [name] is a 58 y.o.  Pt presents for an OV to follow up on hyperlipidemia after start of Lipitor. She is tolerating Lipitor without side effects. She Added yogurt, oatmeal and eating more fish to her diet. She is also is  taking a fiber supplement. She has continued to exercise routinely.  Lipid Panel     Component Value Date/Time   CHOL 264 (H) 04/22/2018 0951   TRIG 161.0 (H) 04/22/2018 0951   HDL 57.50 04/22/2018 0951   CHOLHDL 5 04/22/2018 0951   VLDL 32.2 04/22/2018 0951   LDLCALC 174 (H) 04/22/2018 0951   LDLDIRECT 170.3 01/29/2012 1105    Depression screen PHQ 2/9 04/22/2018 04/14/2017 01/29/2012  Decreased Interest 0 0 0  Down, Depressed, Hopeless 0 0 0  PHQ - 2 Score 0 0 0  Altered sleeping 0 - -  Tired, decreased energy 0 - -  Change in appetite 0 - -  Feeling bad or failure about yourself  0 - -  Trouble concentrating 0 - -  Moving slowly or fidgety/restless 0 - -  Suicidal thoughts 0 - -  PHQ-9 Score 0 - -  Difficult doing work/chores Not difficult at all - -    No Known Allergies Social History   Social History  Narrative   Married. Bobby.   Children: none. No pets.    Stay at home.    Wears her seatbelt. Smoke alarm in the home.    Feels safe in her relationships.    Exercise 5x week.    Past Medical History:  Diagnosis Date  . Breast cancer ([place]) 2018   Lumpectomy, chemotherapy and radiation with recurrence 2018. Now on Femara  . Family history of breast cancer   . Genetic testing 10/30/2016   Breast/GYN panel (23 genes) @ [facility] - No pathogenic mutations detected  . GERD (gastroesophageal reflux disease)    OTC medication  . Heart murmur    as a child - Echo in early 20s "ok"  . History of echocardiogram 05/2015   Echo 4/17: EF 55-60%, GLS -19.6%, normal wall motion  . History of exercise stress test 05/2015   ETT 4/17 - No ST changes  . HLD (hyperlipidemia)   . Multiple thyroid nodules 2009   Multiple nodules: Normal ultrasound reported at diagnosis  . Osteoporosis   . Personal history of chemotherapy   . Personal history of radiation therapy    Past Surgical History:  Procedure Laterality Date  . BREAST LUMPECTOMY Right 2003  . BREAST RECONSTRUCTION WITH PLACEMENT OF TISSUE EXPANDER AND FLEX HD (ACELLULAR HYDRATED DERMIS) Bilateral 11/17/2016   Procedure:  IMEDITATE BREAST RECONSTRUCTION WITH PLACEMENT OF TISSUE EXPANDER AND FLEX HD (ACELLULAR HYDRATED DERMIS);  Surgeon: Wallace Going, DO;  Location: [facility];  Service: Plastics;  Laterality: Bilateral;  . BREAST SURGERY  2003 /right   lumpectomy; chemotherapy and radiation  . REMOVAL OF BILATERAL TISSUE EXPANDERS WITH PLACEMENT OF BILATERAL BREAST IMPLANTS Bilateral 02/11/2017   Procedure: REMOVAL OF BILATERAL TISSUE EXPANDERS WITH PLACEMENT OF BILATERAL SILICONE BREAST IMPLANTS;  Surgeon: Wallace Going, DO;  Location: [facility];  Service: Plastics;  Laterality: Bilateral;  . TOTAL ABDOMINAL HYSTERECTOMY W/ BILATERAL SALPINGOOPHORECTOMY     for treatment of breast treatment  . TOTAL MASTECTOMY Bilateral  11/17/2016   Procedure: RIGHT MASTECTOMY AND LEFT PROPHYLATIC MASTECTOMY;  Surgeon: Jovita Kussmaul, MD;  Location: [facility] OR;  Service: General;  Laterality: Bilateral;   Family History  Problem Relation Age of Onset  . Diabetes Other        1ST DEGREE RELATIVE  . Coronary artery disease Mother        1ST DEGREE RELATIVE  . Heart attack Mother 51  . Skin cancer Mother   . Diabetes Father   . Kidney disease Father   . Prostate cancer Father 69       deceased 27  . Cancer Brother 45       multiple myeloma   . Lung cancer Maternal Uncle        in 2 mat uncles  . Breast cancer Paternal Aunt 68  . Breast cancer Cousin        dx 79s; daughter of unaffected paternal aunt  . Breast cancer Cousin        dx 34s; another daughter of unaffected paternal aunt  . Esophageal cancer Cousin        pat first cousin   Allergies as of 07/29/2018   No Known Allergies     Medication List       Accurate as of July 29, 2018  9:08 AM. If you have any questions, ask your nurse or doctor.        atorvastatin 20 MG tablet Commonly known as: LIPITOR Take 1 tablet (20 mg total) by mouth daily.   Calcium 500 MG Chew Chew by mouth.   denosumab 60 MG/ML Soln injection Commonly known as: PROLIA Inject 60 mg into the skin every 6 (six) months. Administer in upper arm, thigh, or abdomen   letrozole 2.5 MG tablet Commonly known as: FEMARA TAKE 1 TABLET BY MOUTH EVERY DAY   meloxicam 15 MG tablet Commonly known as: MOBIC Take 1 tablet (15 mg total) by mouth daily.       All past medical history, surgical history, allergies, family history, immunizations andmedications were updated in the EMR today and reviewed under the history and medication portions of their EMR.     ROS: Negative, with the exception of above mentioned in HPI   Objective:  BP 108/75 (BP Location: Left Arm, Patient Position: Sitting, Cuff Size: Normal)   Pulse 71   Temp (!) 97.4 F (36.3 C) (Temporal)   Resp 18   Ht 5'  4" (1.626 m)   Wt 146 lb (66.2 kg)   SpO2 96%   BMI 25.06 kg/m  Body mass index is 25.06 kg/m. Gen: Afebrile. No acute distress. Nontoxic in appearance, well developed, well nourished.  HENT: AT. [place]. MMM Eyes:Pupils Equal Round Reactive to light, Extraocular movements intact,  Conjunctiva without redness, discharge or icterus. CV: RRR no murmur, no edema Chest:  CTAB, no wheeze or crackles. Good air movement, normal resp effort.  Skin: no rashes, purpura or petechiae.  Neuro:  Normal gait. PERLA. EOMi. Alert. Oriented x3  No exam data present No results found. No results found for this or any previous visit (from the past 24 hour(s)).  Assessment/Plan: Olivia [name] is a 57 y.o. female present for OV for  Mixed hyperlipidemia - tolerating statin. Continue Lipitor. Plan to follow up at yearly CPE as long as controlled.  - Comp Met (CMET) - Lipid panel  Malignant neoplasm of upper-inner quadrant of right breast in female, estrogen receptor positive ([place]) - agreed to draw CBC and CMP for onc and send a copy to Dr. Burr Medico.  - CBC w/Diff   Reviewed expectations re: course of current medical issues.  Discussed self-management of symptoms.  Outlined signs and symptoms indicating need for more acute intervention.  Patient verbalized understanding and all questions were answered.  Patient received an After-Visit Summary.    No orders of the defined types were placed in this encounter.    Note is dictated utilizing voice recognition software. Although note has been proof read prior to signing, occasional typographical errors still can be missed. If any questions arise, please do not hesitate to call for verification.   electronically signed by:  Howard Pouch, DO  [place]

## 2018-07-29 NOTE — Patient Instructions (Signed)
It was great to see you today.  We will call you with lab results as soon as received.    Cholesterol Cholesterol is a white, waxy, fat-like substance that is needed by the human body in small amounts. The liver makes all the cholesterol we need. Cholesterol is carried from the liver by the blood through the blood vessels. Deposits of cholesterol (plaques) may build up on blood vessel (artery) walls. Plaques make the arteries narrower and stiffer. Cholesterol plaques increase the risk for heart attack and stroke. You cannot feel your cholesterol level even if it is very high. The only way to know that it is high is to have a blood test. Once you know your cholesterol levels, you should keep a record of the test results. Work with your health care provider to keep your levels in the desired range. What do the results mean?  Total cholesterol is a rough measure of all the cholesterol in your blood.  LDL (low-density lipoprotein) is the "bad" cholesterol. This is the type that causes plaque to build up on the artery walls. You want this level to be low.  HDL (high-density lipoprotein) is the "good" cholesterol because it cleans the arteries and carries the LDL away. You want this level to be high.  Triglycerides are fat that the body can either burn for energy or store. High levels are closely linked to heart disease. What are the desired levels of cholesterol?  Total cholesterol below 200.  LDL below 100 for people who are at risk, below 70 for people at very high risk.  HDL above 40 is good. A level of 60 or higher is considered to be protective against heart disease.  Triglycerides below 150. How can I lower my cholesterol? Diet Follow your diet program as told by your health care provider.  Choose fish or white meat chicken and [place], roasted or baked. Limit fatty cuts of red meat, fried foods, and processed meats, such as sausage and lunch meats.  Eat lots of fresh fruits and  vegetables.  Choose whole grains, beans, pasta, potatoes, and cereals.  Choose olive oil, corn oil, or canola oil, and use only small amounts.  Avoid butter, mayonnaise, shortening, or palm kernel oils.  Avoid foods with trans fats.  Drink skim or nonfat milk and eat low-fat or nonfat yogurt and cheeses. Avoid whole milk, cream, ice cream, egg yolks, and full-fat cheeses.  Healthier desserts include angel food cake, ginger snaps, animal crackers, hard candy, popsicles, and low-fat or nonfat frozen yogurt. Avoid pastries, cakes, pies, and cookies.  Exercise  Follow your exercise program as told by your health care provider. A regular program: ? Helps to decrease LDL and raise HDL. ? Helps with weight control.  Do things that increase your activity level, such as gardening, walking, and taking the stairs.  Ask your health care provider about ways that you can be more active in your daily life. Medicine  Take over-the-counter and prescription medicines only as told by your health care provider. ? Medicine may be prescribed by your health care provider to help lower cholesterol and decrease the risk for heart disease. This is usually done if diet and exercise have failed to bring down cholesterol levels. ? If you have several risk factors, you may need medicine even if your levels are normal. This information is not intended to replace advice given to you by your health care provider. Make sure you discuss any questions you have with your health care  provider. Document Released: 10/15/2000 Document Revised: 08/18/2015 Document Reviewed: 07/21/2015 Elsevier Interactive Patient Education  [employer].

## 2018-07-30 ENCOUNTER — Telehealth: Payer: Self-pay | Admitting: Family Medicine

## 2018-07-30 NOTE — Telephone Encounter (Signed)
Pt was called and detailed message was left on VM, okay per [facility]

## 2018-07-30 NOTE — Telephone Encounter (Signed)
Please inform patient the following information: Her labs all look excellent.  Continue the statin medication her cholesterol looks great.  I will forward the lab results over to her oncologist as well for her.

## 2018-08-12 DIAGNOSIS — L820 Inflamed seborrheic keratosis: Secondary | ICD-10-CM | POA: Diagnosis not present

## 2018-08-12 DIAGNOSIS — L814 Other melanin hyperpigmentation: Secondary | ICD-10-CM | POA: Diagnosis not present

## 2018-08-12 DIAGNOSIS — L573 Poikiloderma of Civatte: Secondary | ICD-10-CM | POA: Diagnosis not present

## 2018-08-30 NOTE — Progress Notes (Signed)
[place]   Telephone:(336) [phone] Fax:(336) [phone]   Clinic Follow up Note   Patient Care Team: Ma Hillock, DO as PCP - General (Family Medicine) Truitt Merle, MD as Consulting Physician (Hematology) Jovita Kussmaul, MD as Consulting Physician (General Surgery) Gery Pray, MD as Consulting Physician (Radiation Oncology) Sable Feil, MD as Consulting Physician (Gastroenterology) Molli Posey, MD as Consulting Physician (Obstetrics and Gynecology) Gardenia Phlegm, NP as Nurse Practitioner (Hematology and Oncology) Elayne Snare, MD as Consulting Physician (Endocrinology)  Date of Service:  09/02/2018  CHIEF COMPLAINT: F/u of right breast cancer   SUMMARY OF ONCOLOGIC HISTORY: Oncology History Overview Note  Cancer Staging Malignant neoplasm of upper-inner quadrant of right breast in female, estrogen receptor positive ([place]) Staging form: Breast, AJCC 8th Edition - Clinical stage from 10/09/2016: Stage IA (cT1c, cN0, cM0, G3, ER: Positive, PR: Positive, HER2: Negative) - Signed by Truitt Merle, MD on 10/14/2016 - Pathologic stage from 11/17/2016: Stage IB (pT2, pN0, cM0, G3, ER: Positive, PR: Positive, HER2: Negative, Oncotype DX score: 21) - Signed by Truitt Merle, MD on 12/11/2016     Malignant neoplasm of upper-inner quadrant of right breast in female, estrogen receptor positive ([place])  10/08/2016 Mammogram   Diagnostic mammogram and ultrasound showed an irregular mass in the right breast at 1 o'clock, 5 cm from the nipple measuring 1.6 x 1.4x 1.6 cm, axilla was negative for adenopathy.   10/09/2016 Receptors her2   Estrogen Receptor: 80%, POSITIVE, STRONG STAINING INTENSITY Progesterone Receptor: 30%, POSITIVE, STRONG STAINING INTENSITY Proliferation Marker Ki67: 35% HER2 NEGATIVE    10/09/2016 Initial Diagnosis   Malignant neoplasm of upper-inner quadrant of right breast in female, estrogen receptor positive ([place])   10/09/2016 Initial Biopsy   Diagnosis Breast, right, needle core biopsy, upper inner quadrant, 1:00 o'clock - INVASIVE DUCTAL CARCINOMA, G3   10/2016 Genetic Testing   Genetic testing was negative for The genes analyzed were the 23 genes on [facility]'s Breast/GYN panel (ATM, BARD1, BRCA1, BRCA2, BRIP1, CDH1, CHEK2, DICER1, EPCAM, MLH1,  MSH2, MSH6, NBN, NF1, PALB2, PMS2, PTEN, RAD50, RAD51C, RAD51D,SMARCA4, STK11, and TP53).     11/17/2016 Surgery   RIGHT MASTECTOMY AND LEFT PROPHYLATIC MASTECTOMY by Dr. Marlou Starks  IMEDITATE BREAST RECONSTRUCTION WITH PLACEMENT OF TISSUE EXPANDER AND FLEX HD (ACELLULAR HYDRATED DERMIS) by Dr. Marla Roe     11/17/2016 Pathology Results   Diagnosis 11/17/16 1. Breast, simple mastectomy, Left - BENIGN BREAST TISSUE WITH ECTATIC DUCTS. - ONE OF ONE LYMPH NODE NEGATIVE FOR CARCINOMA (0/1). 2. Breast, simple mastectomy, Right - INVASIVE DUCTAL CARCINOMA, GRADE 3, SPANNING 2.4 CM. - HIGH GRADE DUCTAL CARCINOMA IN SITU. - INVASIVE CARCINOMA IS <0.1 CM OF THE POSTERIOR MARGIN (BROADLY) AND ANTERIOR MARGIN (FOCALLY). - IN SITU CARCINOMA IS >0.2 CM OF BOTH MARGINS. - PRIOR LUMPECTOMY SITE, NEGATIVE FOR CARCINOMA. - SEE ONCOLOGY TABLE. 3. Breast, excision, Superior flap deep margin - BENIGN FIBROADIPOSE TISSUE.   11/17/2016 Miscellaneous   Oncotype on 11/17/16 Rcurence Risk Score of 21 10-Year risk of distant reucrrence with Tamoxifen alone is 13%    12/18/2016 -  Anti-estrogen oral therapy   Letrozole 2.5 mg daily; began 12/18/16   12/24/2016 Imaging   BONE SCAN IMPRESSION: No evidence of osseous metastatic disease.   12/24/2016 Imaging   CT CAP IMPRESSION: 1. Status post bilateral modified radical mastectomy and breast reconstruction, as above. No findings to suggest metastatic disease in the chest, abdomen or pelvis. 2. Multiple heterogeneous appearing thyroid nodules, as discussed above. These are nonspecific  but further evaluation with nonemergent thyroid ultrasound is  recommended in the near future to determine any potential need for further evaluation with fine-needle aspiration. 3. Incidental findings, as above.   02/11/2017 Surgery   REMOVAL OF BILATERAL TISSUE EXPANDERS WITH PLACEMENT OF BILATERAL SILICONE BREAST IMPLANTS by Dr. Marla Roe       CURRENT THERAPY:  1. Adjuvant Letrozole2.5 mg daily; began11/15/18 2. Prolia injections every 6 months since 2016 through her PCP   INTERVAL HISTORY:  Olivia [name] is here for a follow up right breast cancer. She presents to the clinic alone. She notes she is doing well. She has not new changes. She is tolerating Letrozole well. She only has mild aches and joint pain which is manageable. She has still been exercising at home. She also does yoga online. She has no concerns overall. She sees her PCP 2-3 times a day.    REVIEW OF SYSTEMS:   Constitutional: Denies fevers, chills or abnormal weight loss Eyes: Denies blurriness of vision Ears, nose, mouth, throat, and face: Denies mucositis or sore throat Respiratory: Denies cough, dyspnea or wheezes Cardiovascular: Denies palpitation, chest discomfort or lower extremity swelling Gastrointestinal:  Denies nausea, heartburn or change in bowel habits Skin: Denies abnormal skin rashes MSK: (+) Joint pain and aches, manageable.  Lymphatics: Denies new lymphadenopathy or easy bruising Neurological:Denies numbness, tingling or new weaknesses Behavioral/Psych: Mood is stable, no new changes  All other systems were reviewed with the patient and are negative.  MEDICAL HISTORY:  Past Medical History:  Diagnosis Date  . Breast cancer ([place]) 2018   Lumpectomy, chemotherapy and radiation with recurrence 2018. Now on Femara  . Family history of breast cancer   . Genetic testing 10/30/2016   Breast/GYN panel (23 genes) @ [facility] - No pathogenic mutations detected  . GERD (gastroesophageal reflux disease)    OTC medication  . Heart murmur    as a child - Echo in  early 20s "ok"  . History of echocardiogram 05/2015   Echo 4/17: EF 55-60%, GLS -19.6%, normal wall motion  . History of exercise stress test 05/2015   ETT 4/17 - No ST changes  . HLD (hyperlipidemia)   . Multiple thyroid nodules 2009   Multiple nodules: Normal ultrasound reported at diagnosis  . Osteoporosis   . Personal history of chemotherapy   . Personal history of radiation therapy     SURGICAL HISTORY: Past Surgical History:  Procedure Laterality Date  . BREAST LUMPECTOMY Right 2003  . BREAST RECONSTRUCTION WITH PLACEMENT OF TISSUE EXPANDER AND FLEX HD (ACELLULAR HYDRATED DERMIS) Bilateral 11/17/2016   Procedure: IMEDITATE BREAST RECONSTRUCTION WITH PLACEMENT OF TISSUE EXPANDER AND FLEX HD (ACELLULAR HYDRATED DERMIS);  Surgeon: Wallace Going, DO;  Location: [place];  Service: Plastics;  Laterality: Bilateral;  . BREAST SURGERY  2003 /right   lumpectomy; chemotherapy and radiation  . REMOVAL OF BILATERAL TISSUE EXPANDERS WITH PLACEMENT OF BILATERAL BREAST IMPLANTS Bilateral 02/11/2017   Procedure: REMOVAL OF BILATERAL TISSUE EXPANDERS WITH PLACEMENT OF BILATERAL SILICONE BREAST IMPLANTS;  Surgeon: Wallace Going, DO;  Location: [place];  Service: Plastics;  Laterality: Bilateral;  . TOTAL ABDOMINAL HYSTERECTOMY W/ BILATERAL SALPINGOOPHORECTOMY     for treatment of breast treatment  . TOTAL MASTECTOMY Bilateral 11/17/2016   Procedure: RIGHT MASTECTOMY AND LEFT PROPHYLATIC MASTECTOMY;  Surgeon: Jovita Kussmaul, MD;  Location: [facility];  Service: General;  Laterality: Bilateral;    I have reviewed the social history and family history with the patient  and they are unchanged from previous note.  ALLERGIES:  has No Known Allergies.  MEDICATIONS:  Current Outpatient Medications  Medication Sig Dispense Refill  . atorvastatin (LIPITOR) 20 MG tablet Take 1 tablet (20 mg total) by mouth daily. 90 tablet 3  . Calcium 500 MG CHEW Chew by mouth.    . denosumab  (PROLIA) 60 MG/ML SOLN injection Inject 60 mg into the skin every 6 (six) months. Administer in upper arm, thigh, or abdomen 1 Syringe 1  . letrozole (FEMARA) 2.5 MG tablet TAKE 1 TABLET BY MOUTH EVERY DAY 90 tablet 3  . meloxicam (MOBIC) 15 MG tablet Take 1 tablet (15 mg total) by mouth daily. 90 tablet 3   No current facility-administered medications for this visit.     PHYSICAL EXAMINATION: ECOG PERFORMANCE STATUS: 0 - Asymptomatic  Vitals:   09/02/18 1015  BP: 135/88  Pulse: 74  Resp: 18  Temp: 97.8 F (36.6 C)  SpO2: 98%   Filed Weights   09/02/18 1015  Weight: 146 lb 6.4 oz (66.4 kg)    GENERAL:alert, no distress and comfortable SKIN: skin color, texture, turgor are normal, no rashes or significant lesions EYES: normal, Conjunctiva are pink and non-injected, sclera clear  NECK: supple, thyroid normal size, non-tender, without nodularity LYMPH:  no palpable lymphadenopathy in the cervical, axillary  LUNGS: clear to auscultation and percussion with normal breathing effort HEART: regular rate & rhythm and no murmurs and no lower extremity edema ABDOMEN:abdomen soft, non-tender and normal bowel sounds Musculoskeletal:no cyanosis of digits and no clubbing  NEURO: alert & oriented x 3 with fluent speech, no focal motor/sensory deficits BREAST: (+) S/p b//l mastectomy: Surgical incisions healed well. No palpable mass, nodules or adenopathy bilaterally. Breast exam benign.   LABORATORY DATA:  I have reviewed the data as listed CBC Latest Ref Rng & Units 07/29/2018 04/22/2018 11/26/2017  WBC 4.0 - 10.5 K/uL 4.7 6.1 4.9  Hemoglobin 12.0 - 15.0 g/dL 14.4 15.0 14.8  Hematocrit 36.0 - 46.0 % 43.7 44.7 44.2  Platelets 150.0 - 400.0 K/uL 257.0 287.0 250     CMP Latest Ref Rng & Units 07/29/2018 04/22/2018 11/26/2017  Glucose 70 - 99 mg/dL 97 100(H) 86  BUN 6 - 23 mg/dL _0 Creatinine 0.40 - 1.20 mg/dL 0.85 0.80 1.04(H)  Sodium 135 - 145 mEq/L 142 139 140  Potassium 3.5 -  5.1 mEq/L 4.8 4.4 4.5  Chloride 96 - 112 mEq/L 106 102 104  CO2 19 - 32 mEq/L _1 Calcium 8.4 - 10.5 mg/dL 9.4 9.8 9.5  Total Protein 6.0 - 8.3 g/dL 7.0 7.4 7.5  Total Bilirubin 0.2 - 1.2 mg/dL 0.7 0.6 0.5  Alkaline Phos 39 - 117 U/L 49 53 55  AST 0 - 37 U/L 25 22 32  ALT 0 - 35 U/L 33 25 37      RADIOGRAPHIC STUDIES: I have personally reviewed the radiological images as listed and agreed with the findings in the report. No results found.   ASSESSMENT & PLAN:  TOTIANA EVERSON is a 58 y.o. female with   1. Malignant neoplasm of upper-inner quadrant of right breast, invasive ductal carcinoma, pT2N0M0, Stage 1B, ER/PR: positive, HER2 negative, Grade 3, Oncotype RS 21 -She was diagnosed in 10/2016. She is s/p b/l mastectomy and reconstruction.  -she did not need chemo based on Oncotype RS -She had node-negative mastectomy, therefore will not require adjuvant radiation -Had b/l mastectomy and does not need any mammograms. Will  continue regular breast exams. -She started Letrozole on 12/18/16. Tolerating well with mild hot flashes and joint stiffness. Plan to continue for 5-7 years.  -She is clinically doing very well. Her CBC and CMP from 07/2018 WNL. Physical exam unremarkable. There is no clinical concern for recurrence.  -Continue Surveillance and continue letrozole  -Phone visit in 4 months and f/u with OV in 8 months    2. History of Right Breast Cancer, pT2N1M0, ER+/pr+/her2-, in 2003 -Treated by Dr. Eston Esters -s/p lumpectomy and axillary lymph node dissection (3/7 positive lymph nodes), chemo, radiation, tamoxifen for 2 years and Anastrozole for 3 years -total hysterectomy/BSO in 2006   3. Genetic testing was negative  4. Osteopenia  -After being on Arimidex for 3 years she developed osteoporosis.  -She was previously on Fosamax for 5 years. Now she is on Prolia injection for the past 2 years. She receives injections every 6 months through her PCP -02/02/17 DEXA  showed significant improvement from osteoporosis to osteopenia. Lowest density at Right Femur Neck with a T-Score of -2 -She will continue Prolia Injections every 6 months through her PCP. Next DEXA in 01/2019  -I strongly encourage her totake both calcium, vitamin D and exercise.    5. Thyroid Nodules -12/24/16 CT Chest identifies multiple heterogenous appearing thyroid nodules. -Previous CT chest in 2004 noted bilateral thyroid nodules. Does not appear she has had dedicated thyroid US.  -She reports she had a goiter and was on synthroid >15 years ago but TSH level normalized with diet and exercise and she was weaned off medication.  -Her thyroid is prominent with R side nodularity on 01/22/17 exam.  -I will defer to her PCP Dr. Raoul Pitch for further evaluation and management.   PLAN: -She is clinically doing very well  -Continue Letrozole daily  -Phone visit in 4 months -Lab and f/u in 8 months    No problem-specific Assessment & Plan notes found for this encounter.   No orders of the defined types were placed in this encounter.  All questions were answered. The patient knows to call the clinic with any problems, questions or concerns. No barriers to learning was detected. I spent 15 minutes counseling the patient face to face. The total time spent in the appointment was 20 minutes and more than 50% was on counseling and review of test results     Truitt Merle, MD 09/02/2018   I, Joslyn Devon, am acting as scribe for Truitt Merle, MD.   I have reviewed the above documentation for accuracy and completeness, and I agree with the above.

## 2018-09-02 ENCOUNTER — Other Ambulatory Visit: Payer: BLUE CROSS/BLUE SHIELD

## 2018-09-02 ENCOUNTER — Inpatient Hospital Stay: Payer: BC Managed Care – PPO | Attending: Hematology | Admitting: Hematology

## 2018-09-02 ENCOUNTER — Telehealth: Payer: Self-pay | Admitting: Hematology

## 2018-09-02 ENCOUNTER — Other Ambulatory Visit: Payer: Self-pay

## 2018-09-02 VITALS — BP 135/88 | HR 74 | Temp 97.8°F | Resp 18 | Ht 64.0 in | Wt 146.4 lb

## 2018-09-02 DIAGNOSIS — C50211 Malignant neoplasm of upper-inner quadrant of right female breast: Secondary | ICD-10-CM | POA: Insufficient documentation

## 2018-09-02 DIAGNOSIS — E042 Nontoxic multinodular goiter: Secondary | ICD-10-CM | POA: Diagnosis not present

## 2018-09-02 DIAGNOSIS — Z170 Estrogen receptor positive status [ER+]: Secondary | ICD-10-CM | POA: Diagnosis not present

## 2018-09-02 DIAGNOSIS — Z9013 Acquired absence of bilateral breasts and nipples: Secondary | ICD-10-CM | POA: Diagnosis not present

## 2018-09-02 DIAGNOSIS — Z79811 Long term (current) use of aromatase inhibitors: Secondary | ICD-10-CM | POA: Diagnosis not present

## 2018-09-02 DIAGNOSIS — Z853 Personal history of malignant neoplasm of breast: Secondary | ICD-10-CM | POA: Diagnosis not present

## 2018-09-02 DIAGNOSIS — Z9882 Breast implant status: Secondary | ICD-10-CM | POA: Diagnosis not present

## 2018-09-02 DIAGNOSIS — M810 Age-related osteoporosis without current pathological fracture: Secondary | ICD-10-CM | POA: Diagnosis not present

## 2018-09-02 NOTE — Telephone Encounter (Signed)
Scheduled per los. Patient declined printout  

## 2018-09-04 ENCOUNTER — Encounter: Payer: Self-pay | Admitting: Hematology

## 2018-09-07 ENCOUNTER — Telehealth: Payer: Self-pay | Admitting: Family Medicine

## 2018-09-07 NOTE — Telephone Encounter (Signed)
Prolia form completed and returned to staff.  Please scan a copy into chart.

## 2018-09-08 NOTE — Telephone Encounter (Signed)
Faxed paperwork and sent to be scanned

## 2018-09-29 ENCOUNTER — Ambulatory Visit (INDEPENDENT_AMBULATORY_CARE_PROVIDER_SITE_OTHER): Payer: BC Managed Care – PPO

## 2018-09-29 ENCOUNTER — Other Ambulatory Visit: Payer: Self-pay

## 2018-09-29 DIAGNOSIS — M810 Age-related osteoporosis without current pathological fracture: Secondary | ICD-10-CM

## 2018-09-29 MED ORDER — DENOSUMAB 60 MG/ML ~~LOC~~ SOSY
60.0000 mg | PREFILLED_SYRINGE | Freq: Once | SUBCUTANEOUS | Status: AC
  Administered 2018-09-29: 60 mg via SUBCUTANEOUS

## 2018-10-04 NOTE — Progress Notes (Addendum)
Olivia [name] is a 58 y.o. female presents to the office today for Prolia injections, per physician's orders. Original order: 02/02/2015 Prolia 60 mg SQ , was administered in left upper arm administered 09/29/18. Patient tolerated injection. Patient does not have a follow up appointment scheduled. Will be due for [facility] in 6 months.  Patients next injection is due in 6 months 03/2019  [place] Chard, [place] screening examination/treatment/procedure(s) were performed by non-physician practitioner and as supervising physician I was immediately available for consultation/collaboration.  I agree with above assessment and plan.  Electronically Signed by: Howard Pouch, DO [place] [facility] [place]

## 2018-10-04 NOTE — Progress Notes (Signed)
Fixed note in chart. Thank you

## 2018-10-17 IMAGING — CT CT ABD-PELV W/ CM
2 of 5 series · 12 of 36 positions shown, 15 images · IV contrast (ISOVUE 300)
Comparison: CT of the chest, abdomen and pelvis 06/09/2005.

CLINICAL DATA: 56-year-old female with history of breast cancer
diagnosed in 1334 status post right lumpectomy, with recurrence in
the right breast diagnosed in November 2016, now status post
bilateral mastectomy currently undergoing oral chemotherapy.
Radiation therapy complete.

EXAM:
CT CHEST, ABDOMEN, AND PELVIS WITH CONTRAST
TECHNIQUE: Multidetector CT imaging of the chest, abdomen and pelvis was
performed following the standard protocol during bolus
administration of intravenous contrast.
CONTRAST:  100 mL of Usovue-L88.

[Series 2: cap with · axial · 0.80mm/px · z∈[-554,-59]mm · 9 of 122 slices shown, 12 images]
[im 12/122  mediastinal]
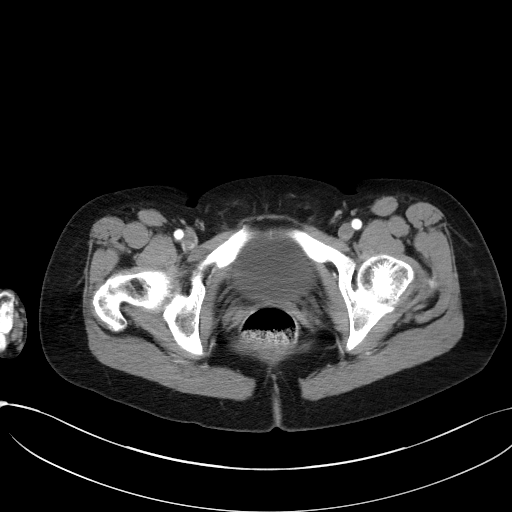
[im 12/122  lung]
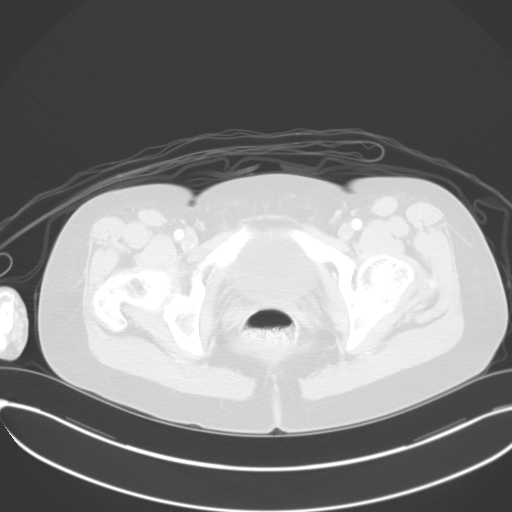
[im 23/122  lung]
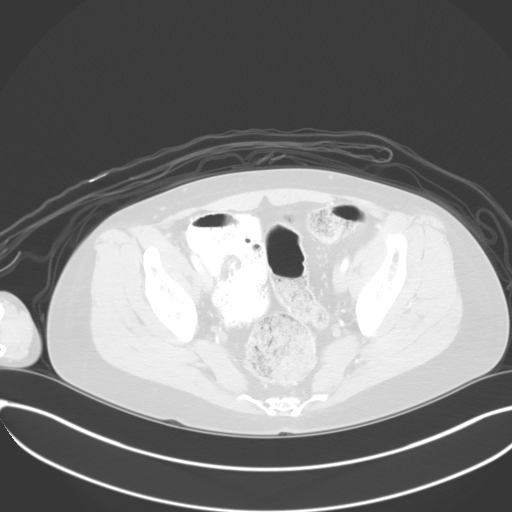
[im 34/122  lung]
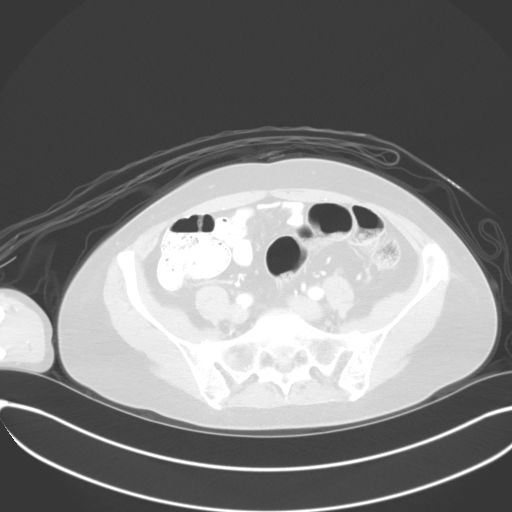
[im 45/122  lung]
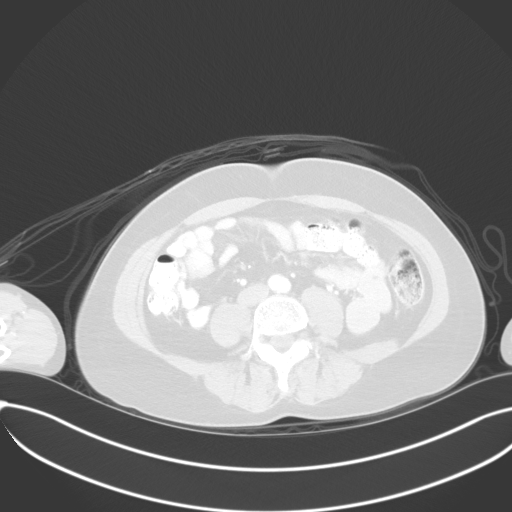
[im 67/122  mediastinal]
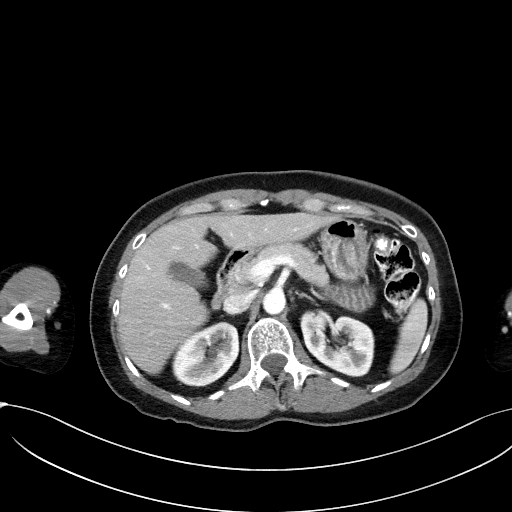
[im 67/122  lung]
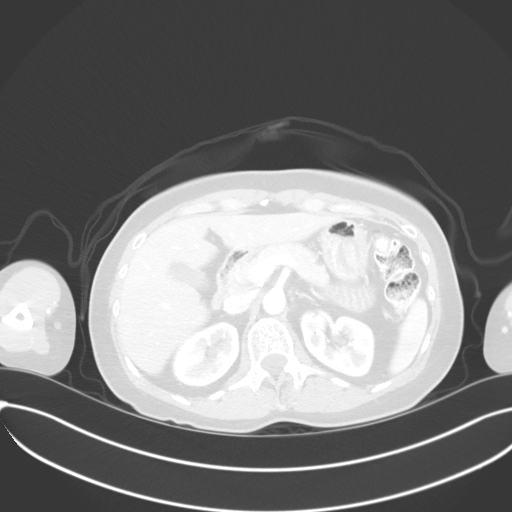
[im 78/122  lung]
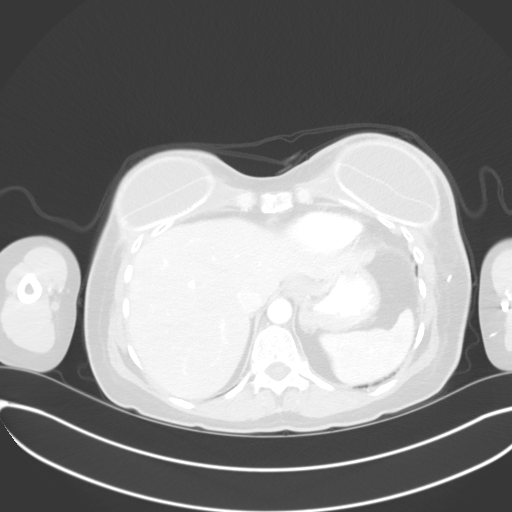
[im 89/122  lung]
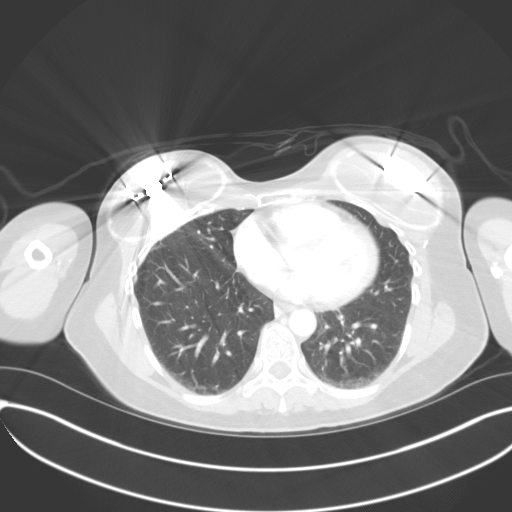
[im 100/122  lung]
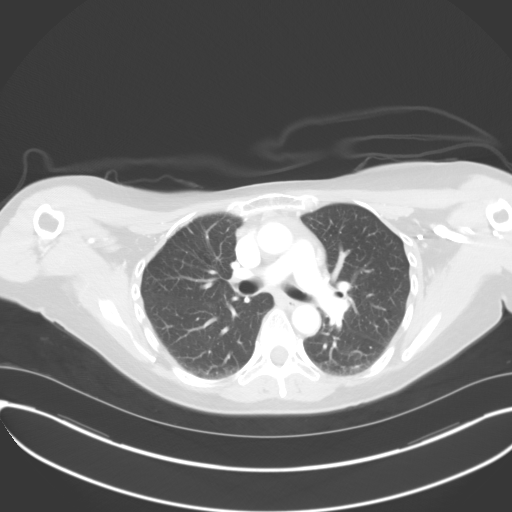
[im 111/122  mediastinal]
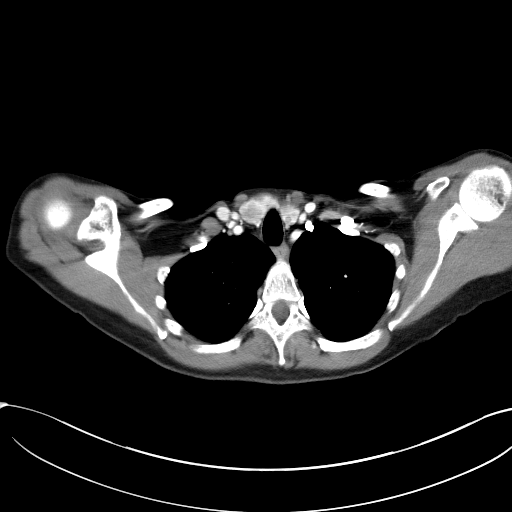
[im 111/122  lung]
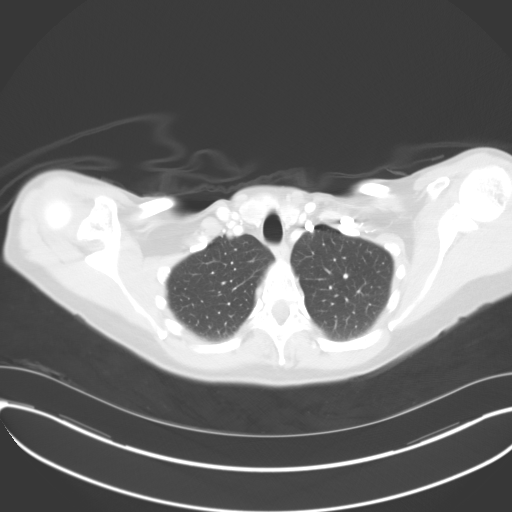

[Series 4: coronals · coronal · 0.73mm/px · 3 of 150 slices shown]
[im 30/150  lung]
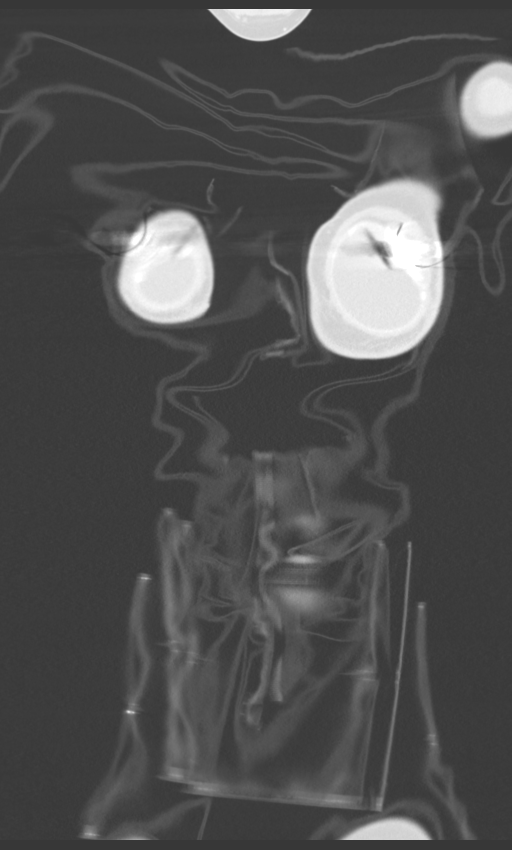
[im 60/150  lung]
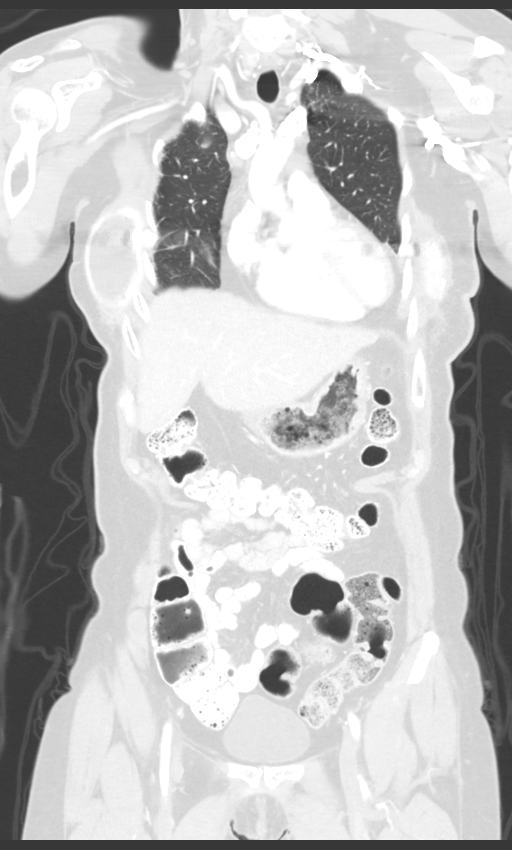
[im 90/150  lung]
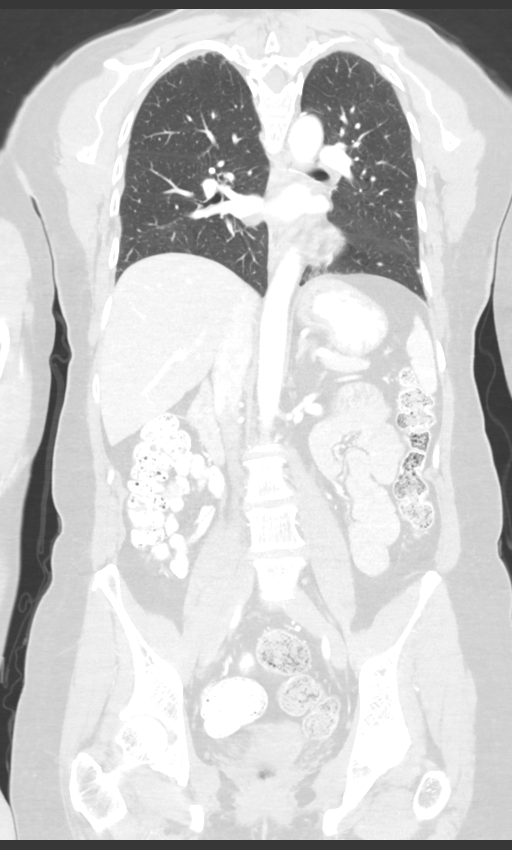

[12 of 36 positions shown; findings below may reference images not displayed]

FINDINGS: CT CHEST FINDINGS

Cardiovascular: Heart size is normal. There is no significant
pericardial fluid, thickening or pericardial calcification. No
atherosclerotic calcifications noted in the thoracic aorta or the
coronary arteries.

Mediastinum/Nodes: Multiple nodules noted throughout the thyroid
gland measuring up to 2 cm in the lower aspect of the right lobe of
the gland. Several of these are very heterogeneous in appearance. No
pathologically enlarged mediastinal, hilar or internal mammary lymph
nodes. Esophagus is unremarkable in appearance. No axillary
lymphadenopathy. Surgical clips in the axillary regions bilaterally,
presumably from prior lymph node dissection.

Lungs/Pleura: 2 mm nodule in the right lower lobe (axial image 77 of
series 6) is unchanged dating back to 8553, considered definitively
benign. No larger more suspicious appearing pulmonary nodules or
masses are noted. No acute consolidative airspace disease. No
pleural effusions.

Musculoskeletal: Status post bilateral modified radical mastectomy
and breast reconstruction with tissue expanders in place bilaterally
in a subpectoral distribution. There are no aggressive appearing
lytic or blastic lesions noted in the visualized portions of the
skeleton.

CT ABDOMEN PELVIS FINDINGS

Hepatobiliary: No suspicious cystic or solid hepatic lesions. No
intra or extrahepatic biliary ductal dilatation. Gallbladder is
normal in appearance.

Pancreas: No pancreatic mass. No pancreatic ductal dilatation. No
pancreatic or peripancreatic fluid or inflammatory changes.

Spleen: Unremarkable.

Adrenals/Urinary Tract: Bilateral adrenal glands and bilateral
kidneys are normal in appearance. No hydroureteronephrosis. Urinary
bladder is normal in appearance.

Stomach/Bowel: The appearance of the stomach is normal. No
pathologic dilatation of small bowel or colon. The appendix is not
confidently identified and may be surgically absent. Regardless,
there are no inflammatory changes noted adjacent to the cecum to
suggest the presence of an acute appendicitis at this time.

Vascular/Lymphatic: No significant atherosclerotic disease, aneurysm
or dissection noted in the abdominal or pelvic vasculature. No
lymphadenopathy noted in the abdomen or pelvis.

Reproductive: Status posthysterectomy. Ovaries are not confidently
identified may be surgically absent or atrophic.

Other: No significant volume of ascites.  No pneumoperitoneum.

Musculoskeletal: There are no aggressive appearing lytic or blastic
lesions noted in the visualized portions of the skeleton.
IMPRESSION: 1. Status post bilateral modified radical mastectomy and breast
reconstruction, as above. No findings to suggest metastatic disease
in the chest, abdomen or pelvis.
2. Multiple heterogeneous appearing thyroid nodules, as discussed
above. These are nonspecific but further evaluation with nonemergent
thyroid ultrasound is recommended in the near future to determine
any potential need for further evaluation with fine-needle
aspiration.
3. Incidental findings, as above.

## 2018-11-23 DIAGNOSIS — L821 Other seborrheic keratosis: Secondary | ICD-10-CM | POA: Diagnosis not present

## 2018-11-23 DIAGNOSIS — D225 Melanocytic nevi of trunk: Secondary | ICD-10-CM | POA: Diagnosis not present

## 2018-11-23 DIAGNOSIS — L814 Other melanin hyperpigmentation: Secondary | ICD-10-CM | POA: Diagnosis not present

## 2018-12-27 NOTE — Progress Notes (Signed)
[place]   [phone] [phone]   Clinic Follow up Note   Patient Care Team: Ma Hillock, DO as PCP - General (Family Medicine) Truitt Merle, MD as Consulting Physician (Hematology) Jovita Kussmaul, MD as Consulting Physician (General Surgery) Gery Pray, MD as Consulting Physician (Radiation Oncology) Sable Feil, MD as Consulting Physician (Gastroenterology) Molli Posey, MD as Consulting Physician (Obstetrics and Gynecology) Delice Bison Charlestine Massed, NP as Nurse Practitioner (Hematology and Oncology) Elayne Snare, MD as Consulting Physician (Endocrinology)   I connected with Olivia [name] on 01/03/2019 at 10:00 AM EST by video enabled telemedicine visit and verified that I am speaking with the correct person using two identifiers.  I discussed the limitations, risks, security and privacy concerns of performing an evaluation and management service by telephone and the availability of in person appointments. I also discussed with the patient that there may be a patient responsible charge related to this service. The patient expressed understanding and agreed to proceed.   Patient's location:  Her car Provider's location:  My Office  CHIEF COMPLAINT: F/u of right breast cancer  SUMMARY OF ONCOLOGIC HISTORY: Oncology History Overview Note  Cancer Staging Malignant neoplasm of upper-inner quadrant of right breast in female, estrogen receptor positive ([place]) Staging form: Breast, AJCC 8th Edition - Clinical stage from 10/09/2016: Stage IA (cT1c, cN0, cM0, G3, ER: Positive, PR: Positive, HER2: Negative) - Signed by Truitt Merle, MD on 10/14/2016 - Pathologic stage from 11/17/2016: Stage IB (pT2, pN0, cM0, G3, ER: Positive, PR: Positive, HER2: Negative, Oncotype DX score: 21) - Signed by Truitt Merle, MD on 12/11/2016     Malignant neoplasm of upper-inner quadrant of right breast in female, estrogen receptor positive ([place])  10/08/2016 Mammogram    Diagnostic mammogram and ultrasound showed an irregular mass in the right breast at 1 o'clock, 5 cm from the nipple measuring 1.6 x 1.4x 1.6 cm, axilla was negative for adenopathy.   10/09/2016 Receptors her2   Estrogen Receptor: 80%, POSITIVE, STRONG STAINING INTENSITY Progesterone Receptor: 30%, POSITIVE, STRONG STAINING INTENSITY Proliferation Marker Ki67: 35% HER2 NEGATIVE    10/09/2016 Initial Diagnosis   Malignant neoplasm of upper-inner quadrant of right breast in female, estrogen receptor positive ([place])   10/09/2016 Initial Biopsy   Diagnosis Breast, right, needle core biopsy, upper inner quadrant, 1:00 o'clock - INVASIVE DUCTAL CARCINOMA, G3   10/2016 Genetic Testing   Genetic testing was negative for The genes analyzed were the 23 genes on [facility]'s Breast/GYN panel (ATM, BARD1, BRCA1, BRCA2, BRIP1, CDH1, CHEK2, DICER1, EPCAM, MLH1,  MSH2, MSH6, NBN, NF1, PALB2, PMS2, PTEN, RAD50, RAD51C, RAD51D,SMARCA4, STK11, and TP53).     11/17/2016 Surgery   RIGHT MASTECTOMY AND LEFT PROPHYLATIC MASTECTOMY by Dr. Marlou Starks  IMEDITATE BREAST RECONSTRUCTION WITH PLACEMENT OF TISSUE EXPANDER AND FLEX HD (ACELLULAR HYDRATED DERMIS) by Dr. Marla Roe     11/17/2016 Pathology Results   Diagnosis 11/17/16 1. Breast, simple mastectomy, Left - BENIGN BREAST TISSUE WITH ECTATIC DUCTS. - ONE OF ONE LYMPH NODE NEGATIVE FOR CARCINOMA (0/1). 2. Breast, simple mastectomy, Right - INVASIVE DUCTAL CARCINOMA, GRADE 3, SPANNING 2.4 CM. - HIGH GRADE DUCTAL CARCINOMA IN SITU. - INVASIVE CARCINOMA IS <0.1 CM OF THE POSTERIOR MARGIN (BROADLY) AND ANTERIOR MARGIN (FOCALLY). - IN SITU CARCINOMA IS >0.2 CM OF BOTH MARGINS. - PRIOR LUMPECTOMY SITE, NEGATIVE FOR CARCINOMA. - SEE ONCOLOGY TABLE. 3. Breast, excision, Superior flap deep margin - BENIGN FIBROADIPOSE TISSUE.   11/17/2016 Miscellaneous   Oncotype on 11/17/16 Rcurence Risk  Score of 21 10-Year risk of distant reucrrence with Tamoxifen alone is 13%     12/18/2016 -  Anti-estrogen oral therapy   Letrozole 2.5 mg daily; began 12/18/16   12/24/2016 Imaging   BONE SCAN IMPRESSION: No evidence of osseous metastatic disease.   12/24/2016 Imaging   CT CAP IMPRESSION: 1. Status post bilateral modified radical mastectomy and breast reconstruction, as above. No findings to suggest metastatic disease in the chest, abdomen or pelvis. 2. Multiple heterogeneous appearing thyroid nodules, as discussed above. These are nonspecific but further evaluation with nonemergent thyroid ultrasound is recommended in the near future to determine any potential need for further evaluation with fine-needle aspiration. 3. Incidental findings, as above.   02/11/2017 Surgery   REMOVAL OF BILATERAL TISSUE EXPANDERS WITH PLACEMENT OF BILATERAL SILICONE BREAST IMPLANTS by Dr. Marla Roe       CURRENT THERAPY:  1. Adjuvant Letrozole2.5 mg daily; began11/15/18 2. Prolia injections every 6 months since 2016 through her PCP  INTERVAL HISTORY:  Olivia [name] is here for a follow up of right breast cancer. They identified themselves by face to face video. She notes she is doing well without complaints. She notes she is tolerating Letrozole well. She notes her hot flashes are minimal. She notes stiff in her fingers, hip are tight and her feet can ache but this is manageable to her. She notes she has been able to remain active at home. She denies nay new pain and notes her appetite and eating is doing well. Overall her weight is stable but would like to lose weight.    REVIEW OF SYSTEMS:   Constitutional: Denies fevers, chills or abnormal weight loss Eyes: Denies blurriness of vision Ears, nose, mouth, throat, and face: Denies mucositis or sore throat Respiratory: Denies cough, dyspnea or wheezes Cardiovascular: Denies palpitation, chest discomfort or lower extremity swelling Gastrointestinal:  Denies nausea, heartburn or change in bowel habits Skin: Denies  abnormal skin rashes MSK: (+) Joint stiff of fingers (+) tightness of hips (+) feet aching  Lymphatics: Denies new lymphadenopathy or easy bruising Neurological:Denies numbness, tingling or new weaknesses Behavioral/Psych: Mood is stable, no new changes  All other systems were reviewed with the patient and are negative.  MEDICAL HISTORY:  Past Medical History:  Diagnosis Date  . Breast cancer ([place]) 2018   Lumpectomy, chemotherapy and radiation with recurrence 2018. Now on Femara  . Family history of breast cancer   . Genetic testing 10/30/2016   Breast/GYN panel (23 genes) @ [facility] - No pathogenic mutations detected  . GERD (gastroesophageal reflux disease)    OTC medication  . Heart murmur    as a child - Echo in early 20s "ok"  . History of echocardiogram 05/2015   Echo 4/17: EF 55-60%, GLS -19.6%, normal wall motion  . History of exercise stress test 05/2015   ETT 4/17 - No ST changes  . HLD (hyperlipidemia)   . Multiple thyroid nodules 2009   Multiple nodules: Normal ultrasound reported at diagnosis  . Osteoporosis   . Personal history of chemotherapy   . Personal history of radiation therapy     SURGICAL HISTORY: Past Surgical History:  Procedure Laterality Date  . BREAST LUMPECTOMY Right 2003  . BREAST RECONSTRUCTION WITH PLACEMENT OF TISSUE EXPANDER AND FLEX HD (ACELLULAR HYDRATED DERMIS) Bilateral 11/17/2016   Procedure: IMEDITATE BREAST RECONSTRUCTION WITH PLACEMENT OF TISSUE EXPANDER AND FLEX HD (ACELLULAR HYDRATED DERMIS);  Surgeon: Wallace Going, DO;  Location: [facility] [place] [facility];  Service: Plastics;  Laterality: Bilateral;  .  BREAST SURGERY  2003 /right   lumpectomy; chemotherapy and radiation  . REMOVAL OF BILATERAL TISSUE EXPANDERS WITH PLACEMENT OF BILATERAL BREAST IMPLANTS Bilateral 02/11/2017   Procedure: REMOVAL OF BILATERAL TISSUE EXPANDERS WITH PLACEMENT OF BILATERAL SILICONE BREAST IMPLANTS;  Surgeon: Wallace Going, DO;  Location: [facility];  Service: Plastics;  Laterality: Bilateral;  . TOTAL ABDOMINAL HYSTERECTOMY W/ BILATERAL SALPINGOOPHORECTOMY     for treatment of breast treatment  . TOTAL MASTECTOMY Bilateral 11/17/2016   Procedure: RIGHT MASTECTOMY AND LEFT PROPHYLATIC MASTECTOMY;  Surgeon: Jovita Kussmaul, MD;  Location: [facility];  Service: General;  Laterality: Bilateral;    I have reviewed the social history and family history with the patient and they are unchanged from previous note.  ALLERGIES:  has No Known Allergies.  MEDICATIONS:  Current Outpatient Medications  Medication Sig Dispense Refill  . atorvastatin (LIPITOR) 20 MG tablet Take 1 tablet (20 mg total) by mouth daily. 90 tablet 3  . Calcium 500 MG CHEW Chew by mouth.    . denosumab (PROLIA) 60 MG/ML SOLN injection Inject 60 mg into the skin every 6 (six) months. Administer in upper arm, thigh, or abdomen 1 Syringe 1  . letrozole (FEMARA) 2.5 MG tablet TAKE 1 TABLET BY MOUTH EVERY DAY 90 tablet 3  . meloxicam (MOBIC) 15 MG tablet Take 1 tablet (15 mg total) by mouth daily. 90 tablet 3   No current facility-administered medications for this visit.     PHYSICAL EXAMINATION: ECOG PERFORMANCE STATUS: 0 - Asymptomatic  No vitals taken today, Exam not performed today   LABORATORY DATA:  I have reviewed the data as listed CBC Latest Ref Rng & Units 07/29/2018 04/22/2018 11/26/2017  WBC 4.0 - 10.5 K/uL 4.7 6.1 4.9  Hemoglobin 12.0 - 15.0 g/dL 14.4 15.0 14.8  Hematocrit 36.0 - 46.0 % 43.7 44.7 44.2  Platelets 150.0 - 400.0 K/uL 257.0 287.0 250     CMP Latest Ref Rng & Units 07/29/2018 04/22/2018 11/26/2017  Glucose 70 - 99 mg/dL 97 100(H) 86  BUN 6 - 23 mg/dL '20 21 16  '$ Creatinine 0.40 - 1.20 mg/dL 0.85 0.80 1.04(H)  Sodium 135 - 145 mEq/L 142 139 140  Potassium 3.5 - 5.1 mEq/L 4.8 4.4 4.5  Chloride 96 - 112 mEq/L 106 102 104  CO2 19 - 32 mEq/L '27 28 27  '$ Calcium 8.4 - 10.5 mg/dL 9.4 9.8 9.5  Total Protein 6.0 - 8.3 g/dL 7.0 7.4 7.5  Total  Bilirubin 0.2 - 1.2 mg/dL 0.7 0.6 0.5  Alkaline Phos 39 - 117 U/L 49 53 55  AST 0 - 37 U/L 25 22 32  ALT 0 - 35 U/L 33 25 37      RADIOGRAPHIC STUDIES: I have personally reviewed the radiological images as listed and agreed with the findings in the report. No results found.   ASSESSMENT & PLAN:  Olivia [name] is a 58 y.o. female with   1. Malignant neoplasm of upper-inner quadrant of right breast, invasive ductal carcinoma, pT2N0M0, Stage 1B, ER/PR: positive, HER2 negative, Grade 3, Oncotype RS 21 -She was diagnosed in 10/2016. She is s/p b/l mastectomy and reconstruction. She did not need chemo based on Oncotype RS. She hadnode-negativemastectomy, therefore will not require adjuvant radiation -Had b/l mastectomy and does not need any mammograms. Will continue regular breast exams. -She started Letrozole on 12/18/16. Tolerating well withmild hot flashes and joint stiffness, which have improved latley. Plan to continue for 7 years.  -She is  clinically doing well without any major changes or complaints. There is no clinical concern for recurrence. -Continue Surveillance and continue letrozole  -F/u in 6-7 months    2. History of Right Breast Cancer, pT2N1M0, ER+/pr+/her2-, in 2003 -Treated by Dr. Eston Esters -s/p lumpectomy and axillary lymph node dissection (3/7 positive lymph nodes), chemo, radiation, tamoxifen for 2 years and Anastrozole for 3 years -s/p Total hysterectomy/BSO in 2006   3. Genetictestingwas negative  4. Osteopenia  -After being on Arimidex for 3 years she developed osteoporosis.  -She was previously on Fosamax for 5 years. Now she has been on Prolia injection for over 2 years. She receives injections every 6 monthsthrough her PCP -02/02/17 DEXA showed significant improvement from osteoporosis to osteopenia. Lowest density at Right Femur Neck with a T-Score of -2 -She will continue Prolia Injections every 6 months through her PCP.Next DEXA in 04/2019  -Istronglyencourage her totake bothcalcium, vitamin D and exercise.    5. Thyroid Nodules -Seen on 12/24/16 CT Chest. Thyroid is prominent with R side nodularity on 01/22/17 exam.  -She was previously on thyroid replacement in the past for goiter.  -She will continue to f/u with her PCP Dr. Raoul Pitch.    PLAN: -She is clinically doing very well  -Continue Letrozole daily  -DEXA in 04/2019 at PCP office  -Lab with PCP in 04/2019 -f/u and lab in 6-7 months   No problem-specific Assessment & Plan notes found for this encounter.   No orders of the defined types were placed in this encounter.  I discussed the assessment and treatment plan with the patient. The patient was provided an opportunity to ask questions and all were answered. The patient agreed with the plan and demonstrated an understanding of the instructions.  The patient was advised to call back or seek an in-person evaluation if the symptoms worsen or if the condition fails to improve as anticipated.  I provided 12 minutes of face-to-face video visit time during this encounter, and > 50% was spent counseling as documented under my assessment & plan.    Truitt Merle, MD 01/03/2019   I, Joslyn Devon, am acting as scribe for Truitt Merle, MD.   I have reviewed the above documentation for accuracy and completeness, and I agree with the above.

## 2019-01-03 ENCOUNTER — Encounter: Payer: Self-pay | Admitting: Hematology

## 2019-01-03 ENCOUNTER — Telehealth: Payer: Self-pay | Admitting: Hematology

## 2019-01-03 ENCOUNTER — Inpatient Hospital Stay: Payer: BC Managed Care – PPO | Attending: Hematology | Admitting: Hematology

## 2019-01-03 DIAGNOSIS — Z170 Estrogen receptor positive status [ER+]: Secondary | ICD-10-CM | POA: Diagnosis not present

## 2019-01-03 DIAGNOSIS — C50211 Malignant neoplasm of upper-inner quadrant of right female breast: Secondary | ICD-10-CM

## 2019-01-03 NOTE — Telephone Encounter (Signed)
Scheduled appt per 11/30 los.  Left a vm of the appt date and time,.

## 2019-01-04 ENCOUNTER — Telehealth: Payer: Self-pay | Admitting: Family Medicine

## 2019-01-04 NOTE — Telephone Encounter (Signed)
Pt was called and told this would be ordered at her CPE in March. She verbalized understanding

## 2019-01-04 NOTE — Telephone Encounter (Signed)
Patient is due for dexascan in 2021. Please enter order for [facility]. Patient has scheduled next Prolia injection for 04/04/19.

## 2019-01-11 ENCOUNTER — Other Ambulatory Visit: Payer: Self-pay | Admitting: Endocrinology

## 2019-01-11 DIAGNOSIS — E042 Nontoxic multinodular goiter: Secondary | ICD-10-CM

## 2019-01-20 ENCOUNTER — Ambulatory Visit
Admission: RE | Admit: 2019-01-20 | Discharge: 2019-01-20 | Disposition: A | Discharge status: Home / Self Care | Payer: BC Managed Care – PPO | Source: Ambulatory Visit | Attending: Endocrinology | Admitting: Endocrinology

## 2019-01-20 ENCOUNTER — Other Ambulatory Visit: Payer: Self-pay | Admitting: Hematology

## 2019-01-20 ENCOUNTER — Other Ambulatory Visit: Payer: Self-pay

## 2019-01-20 DIAGNOSIS — E042 Nontoxic multinodular goiter: Secondary | ICD-10-CM

## 2019-01-20 DIAGNOSIS — E041 Nontoxic single thyroid nodule: Secondary | ICD-10-CM | POA: Diagnosis not present

## 2019-01-24 ENCOUNTER — Ambulatory Visit (INDEPENDENT_AMBULATORY_CARE_PROVIDER_SITE_OTHER): Payer: BC Managed Care – PPO | Admitting: Endocrinology

## 2019-01-24 ENCOUNTER — Other Ambulatory Visit: Payer: Self-pay

## 2019-01-24 ENCOUNTER — Other Ambulatory Visit: Payer: BC Managed Care – PPO

## 2019-01-24 ENCOUNTER — Encounter: Payer: Self-pay | Admitting: Endocrinology

## 2019-01-24 VITALS — BP 130/80 | HR 82 | Ht 64.0 in | Wt 146.0 lb

## 2019-01-24 DIAGNOSIS — E042 Nontoxic multinodular goiter: Secondary | ICD-10-CM

## 2019-01-24 NOTE — Progress Notes (Signed)
Patient ID: Olivia [name], female   DOB: 1960-11-14, 58 y.o.   MRN: [id number]          Reason for Appointment: Goiter, follow-up    History of Present Illness:   Patient has been referred by her PCP Dr. Raoul Pitch  The patient's thyroid enlargement was first discovered around the year 1999 At that time she was referred to an endocrinologist also She was also told that she had mild hypothyroidism and was given levothyroxine for a couple of years She does not remember details of any further evaluation no previous ultrasound reports are available No previous biopsy has been done She had an ultrasound done by her PCP in 04/2017 showing multiple nodules and 2 of the nodules were relatively significant in characteristics.  Recent history:  She is here for her annual follow-up She feels only a slight swelling on the right side but cannot tell if it is bigger  Again no difficulty with swallowing and no local pressure sensation or choking  Has labs done annually with her PCP  Lab Results  Component Value Date   FREET4 0.80 04/14/2017   FREET4 0.78 12/26/2014   TSH 2.40 04/22/2018   TSH 1.77 04/14/2017   TSH 2.54 12/26/2014    She has had a follow-up ultrasound exam in 12/19 which shows change in the size of the goiter   Previously radiologist had been concerned about right superior and right inferior nodules which are total points of 5 and 6 respectively  Recent ultrasound report:  1. Multinodular goiter as detailed above. 2. Thyroid nodules previously labeled #2 and #6 have been down graded to TR 3 thyroid nodules and an FNA is no longer recommended. 3. There are multiple thyroid nodules as detailed above that require further follow-up with ultrasound. Given the are all TR 3 thyroid nodules, and have been stable for 1 year, a 2 year follow-up ultrasound is recommended.   Previous recommendations: 2. Nodules #1 and #3 have been down graded from TR4 nodules to TR3 nodules,  however both still meet imaging criteria to recommend a 1 year follow-up as clinically indicated. 3. Nodules #2, 4 and 6 are unchanged however again all meet imaging criteria to recommend a 1 year follow-up.     Allergies as of 01/24/2019   No Known Allergies     Medication List       Accurate as of January 24, 2019  3:25 PM. If you have any questions, ask your nurse or doctor.        atorvastatin 20 MG tablet Commonly known as: LIPITOR Take 1 tablet (20 mg total) by mouth daily.   Calcium 500 MG Chew Chew by mouth.   denosumab 60 MG/ML Soln injection Commonly known as: PROLIA Inject 60 mg into the skin every 6 (six) months. Administer in upper arm, thigh, or abdomen   letrozole 2.5 MG tablet Commonly known as: FEMARA TAKE 1 TABLET BY MOUTH EVERY DAY   meloxicam 15 MG tablet Commonly known as: MOBIC Take 1 tablet (15 mg total) by mouth daily.       Allergies: No Known Allergies  Past Medical History:  Diagnosis Date  . Breast cancer ([place]) 2018   Lumpectomy, chemotherapy and radiation with recurrence 2018. Now on Femara  . Family history of breast cancer   . Genetic testing 10/30/2016   Breast/GYN panel (23 genes) @ [facility] - No pathogenic mutations detected  . GERD (gastroesophageal reflux disease)    OTC medication  . Heart  murmur    as a child - Echo in early 20s "ok"  . History of echocardiogram 05/2015   Echo 4/17: EF 55-60%, GLS -19.6%, normal wall motion  . History of exercise stress test 05/2015   ETT 4/17 - No ST changes  . HLD (hyperlipidemia)   . Multiple thyroid nodules 2009   Multiple nodules: Normal ultrasound reported at diagnosis  . Osteoporosis   . Personal history of chemotherapy   . Personal history of radiation therapy     Past Surgical History:  Procedure Laterality Date  . BREAST LUMPECTOMY Right 2003  . BREAST RECONSTRUCTION WITH PLACEMENT OF TISSUE EXPANDER AND FLEX HD (ACELLULAR HYDRATED DERMIS) Bilateral 11/17/2016    Procedure: IMEDITATE BREAST RECONSTRUCTION WITH PLACEMENT OF TISSUE EXPANDER AND FLEX HD (ACELLULAR HYDRATED DERMIS);  Surgeon: Wallace Going, DO;  Location: [facility];  Service: Plastics;  Laterality: Bilateral;  . BREAST SURGERY  2003 /right   lumpectomy; chemotherapy and radiation  . REMOVAL OF BILATERAL TISSUE EXPANDERS WITH PLACEMENT OF BILATERAL BREAST IMPLANTS Bilateral 02/11/2017   Procedure: REMOVAL OF BILATERAL TISSUE EXPANDERS WITH PLACEMENT OF BILATERAL SILICONE BREAST IMPLANTS;  Surgeon: Wallace Going, DO;  Location: [facility];  Service: Plastics;  Laterality: Bilateral;  . TOTAL ABDOMINAL HYSTERECTOMY W/ BILATERAL SALPINGOOPHORECTOMY     for treatment of breast treatment  . TOTAL MASTECTOMY Bilateral 11/17/2016   Procedure: RIGHT MASTECTOMY AND LEFT PROPHYLATIC MASTECTOMY;  Surgeon: Jovita Kussmaul, MD;  Location: [facility];  Service: General;  Laterality: Bilateral;    Family History  Problem Relation Age of Onset  . Diabetes Other        1ST DEGREE RELATIVE  . Coronary artery disease Mother        1ST DEGREE RELATIVE  . Heart attack Mother 42  . Skin cancer Mother   . Diabetes Father   . Kidney disease Father   . Prostate cancer Father 28       deceased 27  . Cancer Brother 79       multiple myeloma   . Lung cancer Maternal Uncle        in 2 mat uncles  . Breast cancer Paternal Aunt 40  . Breast cancer Cousin        dx 64s; daughter of unaffected paternal aunt  . Breast cancer Cousin        dx 29s; another daughter of unaffected paternal aunt  . Esophageal cancer Cousin        pat first cousin    Social History:  reports that she has never smoked. She has never used smokeless tobacco. She reports current alcohol use. She reports that she does not use drugs.    Review of Systems  She has been treated with Prolia for osteoporosis secondary to breast cancer chemotherapy    Examination:   There were no vitals taken for this  visit.  THYROID exam:  Neck circumference is 36.0 cm over the mid thyroid She has an enlarged right lobe of the thyroid which is about twice normal She has multiple small nodules felt along the right lobe extending to the upper pole  Isthmus is not clearly palpable, firm Left lobe is enlarged about 1-1/2 times normal without any palpable nodule  Has no lymphadenopathy.           Assessment/Plan:  Multinodular goiter, long-standing with numerous nodules, mostly under 2 cm.   Has been euthyroid in the past  She has no local pressure sensation or  dysphagia  Her thyroid does not appear as prominent on exam today compared to our last exam Neck circumference is slightly less As outlined above her ultrasound indicates stable to improved characteristics of her nodules  Since she has had a longstanding stable goiter will continue to follow her annually but needs only clinical exam for now She will have TSH done with her PCP in March  Elayne Snare 01/24/2019

## 2019-01-31 DIAGNOSIS — Z170 Estrogen receptor positive status [ER+]: Secondary | ICD-10-CM | POA: Diagnosis not present

## 2019-01-31 DIAGNOSIS — C50211 Malignant neoplasm of upper-inner quadrant of right female breast: Secondary | ICD-10-CM | POA: Diagnosis not present

## 2019-02-04 DIAGNOSIS — U071 COVID-19: Secondary | ICD-10-CM

## 2019-02-04 HISTORY — DX: COVID-19: U07.1

## 2019-02-07 ENCOUNTER — Ambulatory Visit: Payer: BC Managed Care – PPO | Attending: Internal Medicine

## 2019-02-07 ENCOUNTER — Telehealth: Payer: Self-pay

## 2019-02-07 ENCOUNTER — Ambulatory Visit: Payer: BC Managed Care – PPO

## 2019-02-07 DIAGNOSIS — Z20822 Contact with and (suspected) exposure to covid-19: Secondary | ICD-10-CM | POA: Diagnosis not present

## 2019-02-07 NOTE — Telephone Encounter (Signed)
Pt has appt tomorrow to get all questions answered

## 2019-02-07 NOTE — Telephone Encounter (Signed)
Patient called in requesting advice about if she has COVID. Patient's husband tested COVID positive today. Patient has already scheduled an appointment at the [facility] at 2:00 to be tested herself. Patient scheduled a virtual visit for tomorrow 02/08/19 to talk to Dr. Raoul Pitch.

## 2019-02-08 ENCOUNTER — Encounter: Payer: Self-pay | Admitting: Family Medicine

## 2019-02-08 ENCOUNTER — Telehealth: Payer: Self-pay | Admitting: Infectious Diseases

## 2019-02-08 ENCOUNTER — Other Ambulatory Visit: Payer: Self-pay

## 2019-02-08 ENCOUNTER — Ambulatory Visit (INDEPENDENT_AMBULATORY_CARE_PROVIDER_SITE_OTHER): Payer: BC Managed Care – PPO | Admitting: Family Medicine

## 2019-02-08 VITALS — BP 133/90 | HR 100 | Temp 99.1°F | Ht 64.0 in | Wt 142.4 lb

## 2019-02-08 DIAGNOSIS — R05 Cough: Secondary | ICD-10-CM | POA: Diagnosis not present

## 2019-02-08 DIAGNOSIS — R059 Cough, unspecified: Secondary | ICD-10-CM

## 2019-02-08 DIAGNOSIS — Z20822 Contact with and (suspected) exposure to covid-19: Secondary | ICD-10-CM | POA: Diagnosis not present

## 2019-02-08 NOTE — Telephone Encounter (Signed)
Called to discuss with patient about Covid symptoms and the use of bamlanivimab, a monoclonal antibody infusion for those with mild to moderate Covid symptoms and at a high risk of hospitalization.  Pt is qualified for this infusion at the [facility] infusion center due to Cancer.   She is currently awaiting COVID test results however her husband is positive and at [facility] requiring treatment making her likely to also be infected.    Message left to call back

## 2019-02-08 NOTE — Progress Notes (Signed)
VIRTUAL VISIT VIA VIDEO  I connected with Olivia [name] on 02/08/19 at 11:30 AM EST by a video enabled telemedicine application and verified that I am speaking with the correct person using two identifiers. Location patient: Home Location provider: [facility], Office Persons participating in the virtual visit: Patient, Dr. Raoul Pitch and R.Baker, LPN  I discussed the limitations of evaluation and management by telemedicine and the availability of in person appointments. The patient expressed understanding and agreed to proceed.   SUBJECTIVE Chief Complaint  Patient presents with  . Cough    Started feeling bad Sunday. Husband dx wtih COVID. Denies SOB.   . Fever    Temp has been 99.1  . Headache    HPI: Olivia [name] is 59 y.o. female present today for acute illness.  Patient reports her husband tested positive for coronavirus on January 1.  He is nonverbal secondary to dementia, so it is uncertain if he is having symptoms that are subjective.  He has not had fever.  She noticed symptoms starting January 3 in herself with a cough.  She states the cough was dry.  Today she has noticed a low-grade fever and a headache.  She does have some nasal congestion.  She was tested yesterday at the [facility]-which was day 2 of her symptoms.  She denies chills, body aches, changes in smell or taste or GI complaints.  She is eating and drinking well.  ROS: See pertinent positives and negatives per HPI.  Patient Active Problem List   Diagnosis Date Noted  . Arthritis 04/22/2018  . Acquired absence of bilateral breasts and nipples 11/24/2016  . Family history of breast cancer   . Malignant neoplasm of upper-inner quadrant of right breast in female, estrogen receptor positive ([place]) 10/14/2016  . Mixed emotional features as adjustment reaction 11/09/2015  . Encounter for preventive health examination 12/26/2014  . Multiple thyroid nodules 12/26/2014  . Osteoporosis 12/26/2014  .  Hypothyroidism 11/17/2006  . UNEQUAL LEG LENGTH, ACQUIRED 11/17/2006    Social History   Tobacco Use  . Smoking status: Never Smoker  . Smokeless tobacco: Never Used  Substance Use Topics  . Alcohol use: Yes    Alcohol/week: 0.0 standard drinks    Comment: Rare    Current Outpatient Medications:  .  atorvastatin (LIPITOR) 20 MG tablet, Take 1 tablet (20 mg total) by mouth daily., Disp: 90 tablet, Rfl: 3 .  Calcium 500 MG CHEW, Chew by mouth., Disp: , Rfl:  .  denosumab (PROLIA) 60 MG/ML SOLN injection, Inject 60 mg into the skin every 6 (six) months. Administer in upper arm, thigh, or abdomen, Disp: 1 Syringe, Rfl: 1 .  letrozole (FEMARA) 2.5 MG tablet, TAKE 1 TABLET BY MOUTH EVERY DAY, Disp: 90 tablet, Rfl: 3 .  meloxicam (MOBIC) 15 MG tablet, Take 1 tablet (15 mg total) by mouth daily., Disp: 90 tablet, Rfl: 3  No Known Allergies  OBJECTIVE: BP 133/90   Pulse 100   Temp 99.1 F (37.3 C) (Oral)   Ht 5\' 4"  (1.626 m)   Wt 142 lb 6 oz (64.6 kg)   BMI 24.44 kg/m  Gen: No acute distress. Nontoxic in appearance.  HENT: AT. [place].  MMM.  Eyes:Pupils Equal Round Reactive to light, Extraocular movements intact,  Conjunctiva without redness, discharge or icterus. Chest: Cough or shortness of breath not present  Neuro: Normal gait. Alert. Oriented x3  Psych: Normal affect, dress and demeanor. Normal speech. Normal thought content and  judgment.  ASSESSMENT AND PLAN: Olivia [name] is a 59 y.o. female present for  COVID-19 exposure/cough/fever Rest, hydrate.  +/- flonase, mucinex (DM if cough), nettie pot or nasal saline.  Patient's husband is currently hospitalized secondary to dementia, also found to be exposed and positive for Covid. She was tested very early in her onset of symptoms on day 2.  Suspect she is positive given she is the sole caregiver for her husband.  Advised her to maintain self-isolation protocol until January 13.  She can then in self-isolation as long as she has  been fever free for 72 hours. Emergent precautions discussed with her.  Although by algorithm she is at low risk (1) for complications, she may be mildly immunocompromised given her history of breast cancer (twice) and removal of  multiple lymph nodes for her mastectomy, age > 12.  If positive, could consider antiviral infusion for this patient. Will forward to the infusion team so they consider infusion therapy if positive test.  In addition she will need to return to caring for her COVID + husband with severe dementia once he is discharged from hospital.      Howard Pouch, DO 02/08/2019

## 2019-02-08 NOTE — Telephone Encounter (Signed)
Headaches, low grade fever for a few days with sinus congestion. She is awaiting COVID test results. Explained she does meet criteria as long as her test results are positive. Will touch base with her tomorrow.

## 2019-02-08 NOTE — Patient Instructions (Signed)
COVID-19 COVID-19 is a respiratory infection that is caused by a virus called severe acute respiratory syndrome coronavirus 2 (SARS-CoV-2). The disease is also known as coronavirus disease or novel coronavirus. In some people, the virus may not cause any symptoms. In others, it may cause a serious infection. The infection can get worse quickly and can lead to complications, such as:  Pneumonia, or infection of the lungs.  Acute respiratory distress syndrome or ARDS. This is a condition in which fluid build-up in the lungs prevents the lungs from filling with air and passing oxygen into the blood.  Acute respiratory failure. This is a condition in which there is not enough oxygen passing from the lungs to the body or when carbon dioxide is not passing from the lungs out of the body.  Sepsis or septic shock. This is a serious bodily reaction to an infection.  Blood clotting problems.  Secondary infections due to bacteria or fungus.  Organ failure. This is when your body's organs stop working. The virus that causes COVID-19 is contagious. This means that it can spread from person to person through droplets from coughs and sneezes (respiratory secretions). What are the causes? This illness is caused by a virus. You may catch the virus by:  Breathing in droplets from an infected person. Droplets can be spread by a person breathing, speaking, singing, coughing, or sneezing.  Touching something, like a table or a doorknob, that was exposed to the virus (contaminated) and then touching your mouth, nose, or eyes. What increases the risk? Risk for infection You are more likely to be infected with this virus if you:  Are within 6 feet (2 meters) of a person with COVID-19.  Provide care for or live with a person who is infected with COVID-19.  Spend time in crowded indoor spaces or live in shared housing. Risk for serious illness You are more likely to become seriously ill from the virus if you:   Are 50 years of age or older. The higher your age, the more you are at risk for serious illness.  Live in a nursing home or long-term care facility.  Have cancer.  Have a long-term (chronic) disease such as: ? Chronic lung disease, including chronic obstructive pulmonary disease or asthma. ? A long-term disease that lowers your body's ability to fight infection (immunocompromised). ? Heart disease, including heart failure, a condition in which the arteries that lead to the heart become narrow or blocked (coronary artery disease), a disease which makes the heart muscle thick, weak, or stiff (cardiomyopathy). ? Diabetes. ? Chronic kidney disease. ? Sickle cell disease, a condition in which red blood cells have an abnormal "sickle" shape. ? Liver disease.  Are obese. What are the signs or symptoms? Symptoms of this condition can range from mild to severe. Symptoms may appear any time from 2 to 14 days after being exposed to the virus. They include:  A fever or chills.  A cough.  Difficulty breathing.  Headaches, body aches, or muscle aches.  Runny or stuffy (congested) nose.  A sore throat.  New loss of taste or smell. Some people may also have stomach problems, such as nausea, vomiting, or diarrhea. Other people may not have any symptoms of COVID-19. How is this diagnosed? This condition may be diagnosed based on:  Your signs and symptoms, especially if: ? You live in an area with a COVID-19 outbreak. ? You recently traveled to or from an area where the virus is common. ? You   provide care for or live with a person who was diagnosed with COVID-19. ? You were exposed to a person who was diagnosed with COVID-19.  A physical exam.  Lab tests, which may include: ? Taking a sample of fluid from the back of your nose and throat (nasopharyngeal fluid), your nose, or your throat using a swab. ? A sample of mucus from your lungs (sputum). ? Blood tests.  Imaging tests, which  may include, X-rays, CT scan, or ultrasound. How is this treated? At present, there is no medicine to treat COVID-19. Medicines that treat other diseases are being used on a trial basis to see if they are effective against COVID-19. Your health care provider will talk with you about ways to treat your symptoms. For most people, the infection is mild and can be managed at home with rest, fluids, and over-the-counter medicines. Treatment for a serious infection usually takes places in a hospital intensive care unit (ICU). It may include one or more of the following treatments. These treatments are given until your symptoms improve.  Receiving fluids and medicines through an IV.  Supplemental oxygen. Extra oxygen is given through a tube in the nose, a face mask, or a hood.  Positioning you to lie on your stomach (prone position). This makes it easier for oxygen to get into the lungs.  Continuous positive airway pressure (CPAP) or bi-level positive airway pressure (BPAP) machine. This treatment uses mild air pressure to keep the airways open. A tube that is connected to a motor delivers oxygen to the body.  Ventilator. This treatment moves air into and out of the lungs by using a tube that is placed in your windpipe.  Tracheostomy. This is a procedure to create a hole in the neck so that a breathing tube can be inserted.  Extracorporeal membrane oxygenation (ECMO). This procedure gives the lungs a chance to recover by taking over the functions of the heart and lungs. It supplies oxygen to the body and removes carbon dioxide. Follow these instructions at home: Lifestyle  If you are sick, stay home except to get medical care. Your health care provider will tell you how long to stay home. Call your health care provider before you go for medical care.  Rest at home as told by your health care provider.  Do not use any products that contain nicotine or tobacco, such as cigarettes, e-cigarettes, and  chewing tobacco. If you need help quitting, ask your health care provider.  Return to your normal activities as told by your health care provider. Ask your health care provider what activities are safe for you. General instructions  Take over-the-counter and prescription medicines only as told by your health care provider.  Drink enough fluid to keep your urine pale yellow.  Keep all follow-up visits as told by your health care provider. This is important. How is this prevented?  There is no vaccine to help prevent COVID-19 infection. However, there are steps you can take to protect yourself and others from this virus. To protect yourself:   Do not travel to areas where COVID-19 is a risk. The areas where COVID-19 is reported change often. To identify high-risk areas and travel restrictions, check the CDC travel website: wwwnc.cdc.gov/travel/notices  If you live in, or must travel to, an area where COVID-19 is a risk, take precautions to avoid infection. ? Stay away from people who are sick. ? Wash your hands often with soap and water for 20 seconds. If soap and water   are not available, use an alcohol-based hand sanitizer. ? Avoid touching your mouth, face, eyes, or nose. ? Avoid going out in public, follow guidance from your state and local health authorities. ? If you must go out in public, wear a cloth face covering or face mask. Make sure your mask covers your nose and mouth. ? Avoid crowded indoor spaces. Stay at least 6 feet (2 meters) away from others. ? Disinfect objects and surfaces that are frequently touched every day. This may include:  Counters and tables.  Doorknobs and light switches.  Sinks and faucets.  Electronics, such as phones, remote controls, keyboards, computers, and tablets. To protect others: If you have symptoms of COVID-19, take steps to prevent the virus from spreading to others.  If you think you have a COVID-19 infection, contact your health care  provider right away. Tell your health care team that you think you may have a COVID-19 infection.  Stay home. Leave your house only to seek medical care. Do not use public transport.  Do not travel while you are sick.  Wash your hands often with soap and water for 20 seconds. If soap and water are not available, use alcohol-based hand sanitizer.  Stay away from other members of your household. Let healthy household members care for children and pets, if possible. If you have to care for children or pets, wash your hands often and wear a mask. If possible, stay in your own room, separate from others. Use a different bathroom.  Make sure that all people in your household wash their hands well and often.  Cough or sneeze into a tissue or your sleeve or elbow. Do not cough or sneeze into your hand or into the air.  Wear a cloth face covering or face mask. Make sure your mask covers your nose and mouth. Where to find more information  Centers for Disease Control and Prevention: www.cdc.gov/coronavirus/2019-ncov/index.html  World Health Organization: www.who.int/health-topics/coronavirus Contact a health care provider if:  You live in or have traveled to an area where COVID-19 is a risk and you have symptoms of the infection.  You have had contact with someone who has COVID-19 and you have symptoms of the infection. Get help right away if:  You have trouble breathing.  You have pain or pressure in your chest.  You have confusion.  You have bluish lips and fingernails.  You have difficulty waking from sleep.  You have symptoms that get worse. These symptoms may represent a serious problem that is an emergency. Do not wait to see if the symptoms will go away. Get medical help right away. Call your local emergency services (911 in the [place] Do not drive yourself to the hospital. Let the emergency medical personnel know if you think you have COVID-19. Summary  COVID-19 is a  respiratory infection that is caused by a virus. It is also known as coronavirus disease or novel coronavirus. It can cause serious infections, such as pneumonia, acute respiratory distress syndrome, acute respiratory failure, or sepsis.  The virus that causes COVID-19 is contagious. This means that it can spread from person to person through droplets from breathing, speaking, singing, coughing, or sneezing.  You are more likely to develop a serious illness if you are 50 years of age or older, have a weak immune system, live in a nursing home, or have chronic disease.  There is no medicine to treat COVID-19. Your health care provider will talk with you about ways to treat your symptoms.    Take steps to protect yourself and others from infection. Wash your hands often and disinfect objects and surfaces that are frequently touched every day. Stay away from people who are sick and wear a mask if you are sick. This information is not intended to replace advice given to you by your health care provider. Make sure you discuss any questions you have with your health care provider. Document Revised: 11/19/2018 Document Reviewed: 02/25/2018 Elsevier Patient Education  2020 Else[employer] Inc.  

## 2019-02-09 ENCOUNTER — Other Ambulatory Visit: Payer: Self-pay | Admitting: Infectious Diseases

## 2019-02-09 ENCOUNTER — Ambulatory Visit: Payer: BC Managed Care – PPO | Admitting: Family Medicine

## 2019-02-09 DIAGNOSIS — U071 COVID-19: Secondary | ICD-10-CM

## 2019-02-09 LAB — NOVEL CORONAVIRUS, NAA: SARS-CoV-2, NAA: DETECTED — AB

## 2019-02-09 NOTE — Progress Notes (Signed)
  I connected by phone with Olivia [name] on 02/09/2019 at 9:56 AM to discuss the potential use of an new treatment for mild to moderate COVID-19 viral infection in non-hospitalized patients.  This patient is a 59 y.o. female that meets the FDA criteria for Emergency Use Authorization of bamlanivimab or casirivimab\imdevimab.  Has a (+) direct SARS-CoV-2 viral test result  Has mild or moderate COVID-19   Is ? 58 years of age and weighs ? 40 kg  Is NOT hospitalized due to COVID-19  Is NOT requiring oxygen therapy or requiring an increase in baseline oxygen flow rate due to COVID-19  Is within 10 days of symptom onset  Has at least one of the high risk factor(s) for progression to severe COVID-19 and/or hospitalization as defined in EUA.  Specific high risk criteria : Breast Cancer    I have spoken and communicated the following to the patient or parent/caregiver:  1. FDA has authorized the emergency use of bamlanivimab and casirivimab\imdevimab for the treatment of mild to moderate COVID-19 in adults and pediatric patients with positive results of direct SARS-CoV-2 viral testing who are 22 years of age and older weighing at least 40 kg, and who are at high risk for progressing to severe COVID-19 and/or hospitalization.  2. The significant known and potential risks and benefits of bamlanivimab and casirivimab\imdevimab, and the extent to which such potential risks and benefits are unknown.  3. Information on available alternative treatments and the risks and benefits of those alternatives, including clinical trials.  4. Patients treated with bamlanivimab and casirivimab\imdevimab should continue to self-isolate and use infection control measures (e.g., wear mask, isolate, social distance, avoid sharing personal items, clean and disinfect "high touch" surfaces, and frequent handwashing) according to CDC guidelines.   5. The patient or parent/caregiver has the option to accept or refuse  bamlanivimab or casirivimab\imdevimab .  After reviewing this information with the patient, The patient agreed to proceed with receiving the bamlanimivab infusion and will be provided a copy of the Fact sheet prior to receiving the infusion.Olivia [name] 02/09/2019 9:56 AM

## 2019-02-11 ENCOUNTER — Encounter (HOSPITAL_COMMUNITY): Payer: Self-pay

## 2019-02-11 ENCOUNTER — Ambulatory Visit (HOSPITAL_COMMUNITY)
Admission: RE | Admit: 2019-02-11 | Discharge: 2019-02-11 | Disposition: A | Discharge status: Home / Self Care | Payer: BC Managed Care – PPO | Source: Ambulatory Visit | Attending: Pulmonary Disease | Admitting: Pulmonary Disease

## 2019-02-11 DIAGNOSIS — U071 COVID-19: Secondary | ICD-10-CM | POA: Insufficient documentation

## 2019-02-11 DIAGNOSIS — Z23 Encounter for immunization: Secondary | ICD-10-CM | POA: Diagnosis not present

## 2019-02-11 MED ORDER — FAMOTIDINE IN NACL 20-0.9 MG/50ML-% IV SOLN: 20.0000 mg | Freq: Once | INTRAVENOUS | Status: DC | PRN

## 2019-02-11 MED ORDER — EPINEPHRINE 0.3 MG/0.3ML IJ SOAJ: 0.3000 mg | Freq: Once | INTRAMUSCULAR | Status: DC | PRN

## 2019-02-11 MED ORDER — METHYLPREDNISOLONE SODIUM SUCC 125 MG IJ SOLR: 125.0000 mg | Freq: Once | INTRAMUSCULAR | Status: DC | PRN

## 2019-02-11 MED ORDER — ALBUTEROL SULFATE HFA 108 (90 BASE) MCG/ACT IN AERS: 2.0000 | INHALATION_SPRAY | Freq: Once | RESPIRATORY_TRACT | Status: DC | PRN

## 2019-02-11 MED ORDER — SODIUM CHLORIDE 0.9 % IV SOLN
700.0000 mg | Freq: Once | INTRAVENOUS | Status: AC
  Administered 2019-02-11: 700 mg via INTRAVENOUS
  Filled 2019-02-11: qty 20

## 2019-02-11 MED ORDER — SODIUM CHLORIDE 0.9 % IV SOLN
INTRAVENOUS | Status: DC | PRN
  Administered 2019-02-11: 250 mL via INTRAVENOUS

## 2019-02-11 MED ORDER — DIPHENHYDRAMINE HCL 50 MG/ML IJ SOLN: 50.0000 mg | Freq: Once | INTRAMUSCULAR | Status: DC | PRN

## 2019-02-11 NOTE — Discharge Instructions (Signed)

## 2019-02-11 NOTE — Progress Notes (Signed)
  Diagnosis: COVID-19  Physician: Dr. Raoul Pitch  Procedure: [facility] Med: bamlanivimab infusion - Provided patient with bamlanimivab fact sheet for patients, parents and caregivers prior to infusion.  Complications: No immediate complications noted.  Discharge: Discharged home   Olivia [name] 02/11/2019

## 2019-02-14 ENCOUNTER — Ambulatory Visit (HOSPITAL_COMMUNITY): Payer: BC Managed Care – PPO

## 2019-02-22 ENCOUNTER — Encounter: Payer: Self-pay | Admitting: Family Medicine

## 2019-02-23 ENCOUNTER — Encounter: Payer: Self-pay | Admitting: Family Medicine

## 2019-02-23 ENCOUNTER — Ambulatory Visit (INDEPENDENT_AMBULATORY_CARE_PROVIDER_SITE_OTHER): Payer: BC Managed Care – PPO | Admitting: Family Medicine

## 2019-02-23 ENCOUNTER — Other Ambulatory Visit: Payer: Self-pay

## 2019-02-23 VITALS — BP 137/87 | Temp 97.7°F | Ht 64.0 in | Wt 141.2 lb

## 2019-02-23 DIAGNOSIS — F5102 Adjustment insomnia: Secondary | ICD-10-CM

## 2019-02-23 DIAGNOSIS — F4320 Adjustment disorder, unspecified: Secondary | ICD-10-CM

## 2019-02-23 DIAGNOSIS — F4321 Adjustment disorder with depressed mood: Secondary | ICD-10-CM

## 2019-02-23 HISTORY — DX: Adjustment insomnia: F51.02

## 2019-02-23 HISTORY — DX: Adjustment disorder with depressed mood: F43.21

## 2019-02-23 HISTORY — DX: Adjustment disorder, unspecified: F43.20

## 2019-02-23 MED ORDER — TRAZODONE HCL 50 MG PO TABS: 25.0000 mg | ORAL_TABLET | Freq: Every evening | ORAL | 5 refills | Status: DC | PRN

## 2019-02-23 NOTE — Progress Notes (Signed)
VIRTUAL VISIT VIA VIDEO  I connected with GENINE MCILVAIN on 02/23/19 at  8:00 AM EST by a video enabled telemedicine application and verified that I am speaking with the correct person using two identifiers. Location patient: Home Location provider: [facility], Office Persons participating in the virtual visit: Patient, Dr. Raoul Pitch and R.Baker, LPN  I discussed the limitations of evaluation and management by telemedicine and the availability of in person appointments. The patient expressed understanding and agreed to proceed.   SUBJECTIVE Chief Complaint  Patient presents with  . Insomnia    Pt has been having trouble sleeping and would like to discuss medication. Husband passed away 2019/02/15    HPI: Olivia [name] is a 59 y.o. female present for sleep disturbance after loss of her husband 02/15/2019 secondary to COVID-19.  Patient reports her husband was discharged on hospice on 02/10/2018.  He was kept comfortable in the home and had a peaceful passing.  She reports since that time she has had great difficulty falling asleep and maintaining sleep.  She reports she seems to be unable to stop thinking or turning off her brain when it is time to go to bed.  She also has noted she has gotten out of her routine.  She states she has great support with her family and friends.  She is hoping to get back to her exercise class very soon.  She believes she is overall coping and adjusting day-to-day.  ROS: See pertinent positives and negatives per HPI.  Patient Active Problem List   Diagnosis Date Noted  . Arthritis 04/22/2018  . Acquired absence of bilateral breasts and nipples 11/24/2016  . Family history of breast cancer   . Malignant neoplasm of upper-inner quadrant of right breast in female, estrogen receptor positive ([place]) 10/14/2016  . Mixed emotional features as adjustment reaction 11/09/2015  . Encounter for preventive health examination 12/26/2014  . Multiple thyroid nodules  12/26/2014  . Osteoporosis 12/26/2014  . Hypothyroidism 11/17/2006  . UNEQUAL LEG LENGTH, ACQUIRED 11/17/2006    Social History   Tobacco Use  . Smoking status: Never Smoker  . Smokeless tobacco: Never Used  Substance Use Topics  . Alcohol use: Yes    Alcohol/week: 0.0 standard drinks    Comment: Rare    Current Outpatient Medications:  .  atorvastatin (LIPITOR) 20 MG tablet, Take 1 tablet (20 mg total) by mouth daily., Disp: 90 tablet, Rfl: 3 .  Calcium 500 MG CHEW, Chew by mouth., Disp: , Rfl:  .  cetirizine (ZYRTEC) 10 MG tablet, Take 10 mg by mouth daily., Disp: , Rfl:  .  denosumab (PROLIA) 60 MG/ML SOLN injection, Inject 60 mg into the skin every 6 (six) months. Administer in upper arm, thigh, or abdomen, Disp: 1 Syringe, Rfl: 1 .  letrozole (FEMARA) 2.5 MG tablet, TAKE 1 TABLET BY MOUTH EVERY DAY, Disp: 90 tablet, Rfl: 3 .  meloxicam (MOBIC) 15 MG tablet, Take 1 tablet (15 mg total) by mouth daily., Disp: 90 tablet, Rfl: 3 .  psyllium (METAMUCIL) 58.6 % powder, Take 1 packet by mouth daily., Disp: , Rfl:  .  Vitamin D, Cholecalciferol, 25 MCG (1000 UT) CAPS, Take 1 capsule by mouth daily., Disp: , Rfl:   No Known Allergies  OBJECTIVE: BP 137/87   Temp 97.7 F (36.5 C) (Oral)   Ht 5\' 4"  (1.626 m)   Wt 141 lb 3 oz (64 kg)   BMI 24.23 kg/m  Gen: No acute distress. Nontoxic in  appearance.  HENT: AT. [place].  MMM.  Eyes:Pupils Equal Round Reactive to light, Extraocular movements intact,  Conjunctiva without redness, discharge or icterus. Neuro:  Alert. Oriented x3  Psych: Normal affect, dress and demeanor. Normal speech. Normal thought content and judgment.  ASSESSMENT AND PLAN: DAJONAE DIMSDALE is a 59 y.o. female present for  Adjustment insomnia/Grief reaction -Difficulty sleeping and maintaining sleep pattern since the death of her husband 03-Mar-2019 secondary to Covid.  -Discussed grief counseling.  She has good support with her family and friends. -Discussed keeping her  routine sleep/wake pattern.  No screen time in the bedroom. -Discussed medication options and she would like to try trazodone nightly.  Trazodone 25-75 mg to be taken approximately 1 hour before bed.  Tapering instructions were explained to her today. -Follow-up 6 months as needed, sooner if needed  No orders of the defined types were placed in this encounter.  Meds ordered this encounter  Medications  . traZODone (DESYREL) 50 MG tablet    Sig: Take 0.5-1.5 tablets (25-75 mg total) by mouth at bedtime as needed for sleep.    Dispense:  45 tablet    Refill:  [place], DO 02/23/2019

## 2019-02-23 NOTE — Patient Instructions (Signed)
Managing Loss, Adult People experience loss in many different ways throughout their lives. Events such as moving, changing jobs, and losing friends can create a sense of loss. The loss may be as serious as a major health change, divorce, death of a pet, or death of a loved one. All of these types of loss are likely to create a physical and emotional reaction known as grief. Grief is the result of a major change or an absence of something or someone that you count on. Grief is a normal reaction to loss. A variety of factors can affect your grieving experience, including:  The nature of your loss.  Your relationship to what or whom you lost.  Your understanding of grief and how to manage it.  Your support system. How to manage lifestyle changes Keep to your normal routine as much as possible.  If you have trouble focusing or doing normal activities, it is acceptable to take some time away from your normal routine.  Spend time with friends and loved ones.  Eat a healthy diet, get plenty of sleep, and rest when you feel tired. How to recognize changes  The way that you deal with your grief will affect your ability to function as you normally do. When grieving, you may experience these changes:  Numbness, shock, sadness, anxiety, anger, denial, and guilt.  Thoughts about death.  Unexpected crying.  A physical sensation of emptiness in your stomach.  Problems sleeping and eating.  Tiredness (fatigue).  Loss of interest in normal activities.  Dreaming about or imagining seeing the person who died.  A need to remember what or whom you lost.  Difficulty thinking about anything other than your loss for a period of time.  Relief. If you have been expecting the loss for a while, you may feel a sense of relief when it happens. Follow these instructions at home:  Activity Express your feelings in healthy ways, such as:  Talking with others about your loss. It may be helpful to find  others who have had a similar loss, such as a support group.  Writing down your feelings in a journal.  Doing physical activities to release stress and emotional energy.  Doing creative activities like painting, sculpting, or playing or listening to music.  Practicing resilience. This is the ability to recover and adjust after facing challenges. Reading some resources that encourage resilience may help you to learn ways to practice those behaviors. General instructions  Be patient with yourself and others. Allow the grieving process to happen, and remember that grieving takes time. ? It is likely that you may never feel completely done with some grief. You may find a way to move on while still cherishing memories and feelings about your loss. ? Accepting your loss is a process. It can take months or longer to adjust.  Keep all follow-up visits as told by your health care provider. This is important. Where to find support To get support for managing loss:  Ask your health care provider for help and recommendations, such as grief counseling or therapy.  Think about joining a support group for people who are managing a loss. Where to find more information You can find more information about managing loss from:  American Society of Clinical Oncology: www.cancer.net  American Psychological Association: www.apa.org Contact a health care provider if:  Your grief is extreme and keeps getting worse.  You have ongoing grief that does not improve.  Your body shows symptoms of grief, such   as illness.  You feel depressed, anxious, or lonely. Get help right away if:  You have thoughts about hurting yourself or others. If you ever feel like you may hurt yourself or others, or have thoughts about taking your own life, get help right away. You can go to your nearest emergency department or call:  Your local emergency services (911 in the [place]  A suicide crisis helpline, such as the  [place] at 7095855786. This is open 24 hours a day. Summary  Grief is the result of a major change or an absence of someone or something that you count on. Grief is a normal reaction to loss.  The depth of grief and the period of recovery depend on the type of loss and your ability to adjust to the change and process your feelings.  Processing grief requires patience and a willingness to accept your feelings and talk about your loss with people who are supportive.  It is important to find resources that work for you and to realize that people experience grief differently. There is not one grieving process that works for everyone in the same way.  Be aware that when grief becomes extreme, it can lead to more severe issues like isolation, depression, anxiety, or suicidal thoughts. Talk with your health care provider if you have any of these issues. This information is not intended to replace advice given to you by your health care provider. Make sure you discuss any questions you have with your health care provider. Document Revised: 03/26/2018 Document Reviewed: 06/05/2016 Elsevier Patient Education  [employer].   Insomnia Insomnia is a sleep disorder that makes it difficult to fall asleep or stay asleep. Insomnia can cause fatigue, low energy, difficulty concentrating, mood swings, and poor performance at work or school. There are three different ways to classify insomnia:  Difficulty falling asleep.  Difficulty staying asleep.  Waking up too early in the morning. Any type of insomnia can be long-term (chronic) or short-term (acute). Both are common. Short-term insomnia usually lasts for three months or less. Chronic insomnia occurs at least three times a week for longer than three months. What are the causes? Insomnia may be caused by another condition, situation, or substance, such as:  Anxiety.  Certain medicines.  Gastroesophageal reflux  disease (GERD) or other gastrointestinal conditions.  Asthma or other breathing conditions.  Restless legs syndrome, sleep apnea, or other sleep disorders.  Chronic pain.  Menopause.  Stroke.  Abuse of alcohol, tobacco, or illegal drugs.  Mental health conditions, such as depression.  Caffeine.  Neurological disorders, such as Alzheimer's disease.  An overactive thyroid (hyperthyroidism). Sometimes, the cause of insomnia may not be known. What increases the risk? Risk factors for insomnia include:  Gender. Women are affected more often than men.  Age. Insomnia is more common as you get older.  Stress.  Lack of exercise.  Irregular work schedule or working night shifts.  Traveling between different time zones.  Certain medical and mental health conditions. What are the signs or symptoms? If you have insomnia, the main symptom is having trouble falling asleep or having trouble staying asleep. This may lead to other symptoms, such as:  Feeling fatigued or having low energy.  Feeling nervous about going to sleep.  Not feeling rested in the morning.  Having trouble concentrating.  Feeling irritable, anxious, or depressed. How is this diagnosed? This condition may be diagnosed based on:  Your symptoms and medical history. Your health care  provider may ask about: ? Your sleep habits. ? Any medical conditions you have. ? Your mental health.  A physical exam. How is this treated? Treatment for insomnia depends on the cause. Treatment may focus on treating an underlying condition that is causing insomnia. Treatment may also include:  Medicines to help you sleep.  Counseling or therapy.  Lifestyle adjustments to help you sleep better. Follow these instructions at home: Eating and drinking   Limit or avoid alcohol, caffeinated beverages, and cigarettes, especially close to bedtime. These can disrupt your sleep.  Do not eat a large meal or eat spicy foods  right before bedtime. This can lead to digestive discomfort that can make it hard for you to sleep. Sleep habits   Keep a sleep diary to help you and your health care provider figure out what could be causing your insomnia. Write down: ? When you sleep. ? When you wake up during the night. ? How well you sleep. ? How rested you feel the next day. ? Any side effects of medicines you are taking. ? What you eat and drink.  Make your bedroom a dark, comfortable place where it is easy to fall asleep. ? Put up shades or blackout curtains to block light from outside. ? Use a white noise machine to block noise. ? Keep the temperature cool.  Limit screen use before bedtime. This includes: ? Watching TV. ? Using your smartphone, tablet, or computer.  Stick to a routine that includes going to bed and waking up at the same times every day and night. This can help you fall asleep faster. Consider making a quiet activity, such as reading, part of your nighttime routine.  Try to avoid taking naps during the day so that you sleep better at night.  Get out of bed if you are still awake after 15 minutes of trying to sleep. Keep the lights down, but try reading or doing a quiet activity. When you feel sleepy, go back to bed. General instructions  Take over-the-counter and prescription medicines only as told by your health care provider.  Exercise regularly, as told by your health care provider. Avoid exercise starting several hours before bedtime.  Use relaxation techniques to manage stress. Ask your health care provider to suggest some techniques that may work well for you. These may include: ? Breathing exercises. ? Routines to release muscle tension. ? Visualizing peaceful scenes.  Make sure that you drive carefully. Avoid driving if you feel very sleepy.  Keep all follow-up visits as told by your health care provider. This is important. Contact a health care provider if:  You are tired  throughout the day.  You have trouble in your daily routine due to sleepiness.  You continue to have sleep problems, or your sleep problems get worse. Get help right away if:  You have serious thoughts about hurting yourself or someone else. If you ever feel like you may hurt yourself or others, or have thoughts about taking your own life, get help right away. You can go to your nearest emergency department or call:  Your local emergency services (911 in the [place]  A suicide crisis helpline, such as the [facility] at (218) 029-0878. This is open 24 hours a day. Summary  Insomnia is a sleep disorder that makes it difficult to fall asleep or stay asleep.  Insomnia can be long-term (chronic) or short-term (acute).  Treatment for insomnia depends on the cause. Treatment may focus on treating an  underlying condition that is causing insomnia.  Keep a sleep diary to help you and your health care provider figure out what could be causing your insomnia. This information is not intended to replace advice given to you by your health care provider. Make sure you discuss any questions you have with your health care provider. Document Revised: 01/02/2017 Document Reviewed: 10/30/2016 Elsevier Patient Education  2020 [employer].

## 2019-03-14 ENCOUNTER — Ambulatory Visit: Payer: BC Managed Care – PPO

## 2019-03-16 ENCOUNTER — Telehealth: Payer: Self-pay | Admitting: Hematology

## 2019-03-16 NOTE — Telephone Encounter (Signed)
Rescheduled appt per MD email.  Spoke with pt and they are aware of her new appt date and time.

## 2019-04-04 ENCOUNTER — Other Ambulatory Visit: Payer: Self-pay

## 2019-04-04 ENCOUNTER — Ambulatory Visit (INDEPENDENT_AMBULATORY_CARE_PROVIDER_SITE_OTHER): Payer: BC Managed Care – PPO | Admitting: Family Medicine

## 2019-04-04 DIAGNOSIS — M810 Age-related osteoporosis without current pathological fracture: Secondary | ICD-10-CM | POA: Diagnosis not present

## 2019-04-04 MED ORDER — DENOSUMAB 60 MG/ML ~~LOC~~ SOSY
60.0000 mg | PREFILLED_SYRINGE | Freq: Once | SUBCUTANEOUS | Status: AC
  Administered 2019-04-04: 60 mg via SUBCUTANEOUS

## 2019-04-04 NOTE — Progress Notes (Addendum)
Olivia [name] is a 59 y.o. female presents to the office today for prolia injections, per physician's orders. Original order: 12.30.2016 Prolia 60mg  SQ was administered left arm today. Patient tolerated injection. Patient due for follow up labs/provider appt: Yes. Date due: 3.22.21, appt made Yes Patient next injection due: 9.2.21, appt made No  Lattie Haw, CMA  Electronically Signed by: Howard Pouch, DO [facility] [place] [facility] [place]

## 2019-04-14 ENCOUNTER — Other Ambulatory Visit: Payer: Self-pay | Admitting: Family Medicine

## 2019-04-14 NOTE — Telephone Encounter (Signed)
RF request for meloxicam LOV:02/23/19 Next ov: advised to f/u 6 months [facility] Last written:04/22/18(90,3)  Please advise. Medication pending

## 2019-04-25 ENCOUNTER — Encounter: Payer: BC Managed Care – PPO | Admitting: Family Medicine

## 2019-04-25 ENCOUNTER — Ambulatory Visit: Payer: BC Managed Care – PPO

## 2019-04-25 NOTE — Progress Notes (Signed)
[place]   [phone] [phone]   Clinic Follow up Note   Patient Care Team: Ma Hillock, DO as PCP - General (Family Medicine) Truitt Merle, MD as Consulting Physician (Hematology) Jovita Kussmaul, MD as Consulting Physician (General Surgery) Gery Pray, MD as Consulting Physician (Radiation Oncology) Sable Feil, MD as Consulting Physician (Gastroenterology) Molli Posey, MD as Consulting Physician (Obstetrics and Gynecology) Delice Bison Charlestine Massed, NP as Nurse Practitioner (Hematology and Oncology) Elayne Snare, MD as Consulting Physician (Endocrinology)   I connected with Olivia [name] on 05/05/2019 at  9:20 AM EDT by video enabled telemedicine visit and verified that I am speaking with the correct person using two identifiers.  I discussed the limitations, risks, security and privacy concerns of performing an evaluation and management service by telephone and the availability of in person appointments. I also discussed with the patient that there may be a patient responsible charge related to this service. The patient expressed understanding and agreed to proceed.   Patient's location:  Her home  Provider's location:  My Office   CHIEF COMPLAINT: F/u of right breast cancer  SUMMARY OF ONCOLOGIC HISTORY: Oncology History Overview Note  Cancer Staging Malignant neoplasm of upper-inner quadrant of right breast in female, estrogen receptor positive ([place]) Staging form: Breast, AJCC 8th Edition - Clinical stage from 10/09/2016: Stage IA (cT1c, cN0, cM0, G3, ER: Positive, PR: Positive, HER2: Negative) - Signed by Truitt Merle, MD on 10/14/2016 - Pathologic stage from 11/17/2016: Stage IB (pT2, pN0, cM0, G3, ER: Positive, PR: Positive, HER2: Negative, Oncotype DX score: 21) - Signed by Truitt Merle, MD on 12/11/2016     Malignant neoplasm of upper-inner quadrant of right breast in female, estrogen receptor positive ([place])  10/08/2016 Mammogram    Diagnostic mammogram and ultrasound showed an irregular mass in the right breast at 1 o'clock, 5 cm from the nipple measuring 1.6 x 1.4x 1.6 cm, axilla was negative for adenopathy.   10/09/2016 Receptors her2   Estrogen Receptor: 80%, POSITIVE, STRONG STAINING INTENSITY Progesterone Receptor: 30%, POSITIVE, STRONG STAINING INTENSITY Proliferation Marker Ki67: 35% HER2 NEGATIVE    10/09/2016 Initial Diagnosis   Malignant neoplasm of upper-inner quadrant of right breast in female, estrogen receptor positive ([place])   10/09/2016 Initial Biopsy   Diagnosis Breast, right, needle core biopsy, upper inner quadrant, 1:00 o'clock - INVASIVE DUCTAL CARCINOMA, G3   10/2016 Genetic Testing   Genetic testing was negative for The genes analyzed were the 23 genes on [facility]'s Breast/GYN panel (ATM, BARD1, BRCA1, BRCA2, BRIP1, CDH1, CHEK2, DICER1, EPCAM, MLH1,  MSH2, MSH6, NBN, NF1, PALB2, PMS2, PTEN, RAD50, RAD51C, RAD51D,SMARCA4, STK11, and TP53).     11/17/2016 Surgery   RIGHT MASTECTOMY AND LEFT PROPHYLATIC MASTECTOMY by Dr. Marlou Starks  IMEDITATE BREAST RECONSTRUCTION WITH PLACEMENT OF TISSUE EXPANDER AND FLEX HD (ACELLULAR HYDRATED DERMIS) by Dr. Marla Roe     11/17/2016 Pathology Results   Diagnosis 11/17/16 1. Breast, simple mastectomy, Left - BENIGN BREAST TISSUE WITH ECTATIC DUCTS. - ONE OF ONE LYMPH NODE NEGATIVE FOR CARCINOMA (0/1). 2. Breast, simple mastectomy, Right - INVASIVE DUCTAL CARCINOMA, GRADE 3, SPANNING 2.4 CM. - HIGH GRADE DUCTAL CARCINOMA IN SITU. - INVASIVE CARCINOMA IS <0.1 CM OF THE POSTERIOR MARGIN (BROADLY) AND ANTERIOR MARGIN (FOCALLY). - IN SITU CARCINOMA IS >0.2 CM OF BOTH MARGINS. - PRIOR LUMPECTOMY SITE, NEGATIVE FOR CARCINOMA. - SEE ONCOLOGY TABLE. 3. Breast, excision, Superior flap deep margin - BENIGN FIBROADIPOSE TISSUE.   11/17/2016 Miscellaneous   Oncotype on  11/17/16 Rcurence Risk Score of 21 10-Year risk of distant reucrrence with Tamoxifen alone is  13%    12/18/2016 -  Anti-estrogen oral therapy   Letrozole 2.5 mg daily; began 12/18/16   12/24/2016 Imaging   BONE SCAN IMPRESSION: No evidence of osseous metastatic disease.   12/24/2016 Imaging   CT CAP IMPRESSION: 1. Status post bilateral modified radical mastectomy and breast reconstruction, as above. No findings to suggest metastatic disease in the chest, abdomen or pelvis. 2. Multiple heterogeneous appearing thyroid nodules, as discussed above. These are nonspecific but further evaluation with nonemergent thyroid ultrasound is recommended in the near future to determine any potential need for further evaluation with fine-needle aspiration. 3. Incidental findings, as above.   02/11/2017 Surgery   REMOVAL OF BILATERAL TISSUE EXPANDERS WITH PLACEMENT OF BILATERAL SILICONE BREAST IMPLANTS by Dr. Marla Roe       CURRENT THERAPY:  1. Adjuvant Letrozole2.5 mg daily; began11/15/18 2. Prolia injections every 6 months since 2016 through her PCP  INTERVAL HISTORY:  Olivia [name] is here for a follow up of right breast cancer. They identified themselves by face to face video. She notes she lost her husband to [place] in 02/2019. He did have advanced dementia so he was not able to recover well. She is able to stay active at home and changed her diet to better control her weight. She has family locally to help support her. She notes she is still getting Prolia with her PCP which she recently had.    REVIEW OF SYSTEMS:   Constitutional: Denies fevers, chills or abnormal weight loss Eyes: Denies blurriness of vision Ears, nose, mouth, throat, and face: Denies mucositis or sore throat Respiratory: Denies cough, dyspnea or wheezes Cardiovascular: Denies palpitation, chest discomfort or lower extremity swelling Gastrointestinal:  Denies nausea, heartburn or change in bowel habits Skin: Denies abnormal skin rashes Lymphatics: Denies new lymphadenopathy or easy  bruising Neurological:Denies numbness, tingling or new weaknesses Behavioral/Psych: Mood is stable, no new changes  All other systems were reviewed with the patient and are negative.  MEDICAL HISTORY:  Past Medical History:  Diagnosis Date  . Breast cancer ([place]) 2018   Lumpectomy, chemotherapy and radiation with recurrence 2018. Now on Femara  . COVID-19 02/2019   Her husband also passed away at this time from COVID-19.  . Family history of breast cancer   . Genetic testing 10/30/2016   Breast/GYN panel (23 genes) @ [facility] - No pathogenic mutations detected  . GERD (gastroesophageal reflux disease)    OTC medication  . Heart murmur    as a child - Echo in early 20s "ok"  . History of echocardiogram 05/2015   Echo 4/17: EF 55-60%, GLS -19.6%, normal wall motion  . History of exercise stress test 05/2015   ETT 4/17 - No ST changes  . HLD (hyperlipidemia)   . Multiple thyroid nodules 2009   Multiple nodules: Normal ultrasound reported at diagnosis  . Osteoporosis   . Personal history of chemotherapy   . Personal history of radiation therapy     SURGICAL HISTORY: Past Surgical History:  Procedure Laterality Date  . BREAST LUMPECTOMY Right 2003  . BREAST RECONSTRUCTION WITH PLACEMENT OF TISSUE EXPANDER AND FLEX HD (ACELLULAR HYDRATED DERMIS) Bilateral 11/17/2016   Procedure: IMEDITATE BREAST RECONSTRUCTION WITH PLACEMENT OF TISSUE EXPANDER AND FLEX HD (ACELLULAR HYDRATED DERMIS);  Surgeon: Wallace Going, DO;  Location: [facility];  Service: Plastics;  Laterality: Bilateral;  . BREAST SURGERY  2003 /right   lumpectomy; chemotherapy  and radiation  . REMOVAL OF BILATERAL TISSUE EXPANDERS WITH PLACEMENT OF BILATERAL BREAST IMPLANTS Bilateral 02/11/2017   Procedure: REMOVAL OF BILATERAL TISSUE EXPANDERS WITH PLACEMENT OF BILATERAL SILICONE BREAST IMPLANTS;  Surgeon: Wallace Going, DO;  Location: [facility];  Service: Plastics;  Laterality: Bilateral;  . TOTAL  ABDOMINAL HYSTERECTOMY W/ BILATERAL SALPINGOOPHORECTOMY     for treatment of breast treatment  . TOTAL MASTECTOMY Bilateral 11/17/2016   Procedure: RIGHT MASTECTOMY AND LEFT PROPHYLATIC MASTECTOMY;  Surgeon: Jovita Kussmaul, MD;  Location: [facility];  Service: General;  Laterality: Bilateral;    I have reviewed the social history and family history with the patient and they are unchanged from previous note.  ALLERGIES:  has No Known Allergies.  MEDICATIONS:  Current Outpatient Medications  Medication Sig Dispense Refill  . atorvastatin (LIPITOR) 20 MG tablet Take 1 tablet (20 mg total) by mouth daily. 90 tablet 3  . Calcium 500 MG CHEW Chew by mouth.    . cetirizine (ZYRTEC) 10 MG tablet Take 10 mg by mouth daily.    Marland Kitchen denosumab (PROLIA) 60 MG/ML SOLN injection Inject 60 mg into the skin every 6 (six) months. Administer in upper arm, thigh, or abdomen 1 Syringe 1  . letrozole (FEMARA) 2.5 MG tablet TAKE 1 TABLET BY MOUTH EVERY DAY 90 tablet 3  . meloxicam (MOBIC) 15 MG tablet TAKE 1 TABLET BY MOUTH EVERY DAY 90 tablet 3  . psyllium (METAMUCIL) 58.6 % powder Take 1 packet by mouth daily.    . traZODone (DESYREL) 50 MG tablet Take 0.5-1.5 tablets (25-75 mg total) by mouth at bedtime as needed for sleep. 45 tablet 5  . Vitamin D, Cholecalciferol, 25 MCG (1000 UT) CAPS Take 1 capsule by mouth daily.     No current facility-administered medications for this visit.    PHYSICAL EXAMINATION: ECOG PERFORMANCE STATUS: 0 - Asymptomatic  No vitals taken today, Exam not performed today   LABORATORY DATA:  I have reviewed the data as listed CBC Latest Ref Rng & Units 07/29/2018 04/22/2018 11/26/2017  WBC 4.0 - 10.5 K/uL 4.7 6.1 4.9  Hemoglobin 12.0 - 15.0 g/dL 14.4 15.0 14.8  Hematocrit 36.0 - 46.0 % 43.7 44.7 44.2  Platelets 150.0 - 400.0 K/uL 257.0 287.0 250     CMP Latest Ref Rng & Units 07/29/2018 04/22/2018 11/26/2017  Glucose 70 - 99 mg/dL 97 100(H) 86  BUN 6 - 23 mg/dL '20 21 16   '$ Creatinine 0.40 - 1.20 mg/dL 0.85 0.80 1.04(H)  Sodium 135 - 145 mEq/L 142 139 140  Potassium 3.5 - 5.1 mEq/L 4.8 4.4 4.5  Chloride 96 - 112 mEq/L 106 102 104  CO2 19 - 32 mEq/L '27 28 27  '$ Calcium 8.4 - 10.5 mg/dL 9.4 9.8 9.5  Total Protein 6.0 - 8.3 g/dL 7.0 7.4 7.5  Total Bilirubin 0.2 - 1.2 mg/dL 0.7 0.6 0.5  Alkaline Phos 39 - 117 U/L 49 53 55  AST 0 - 37 U/L 25 22 32  ALT 0 - 35 U/L 33 25 37      RADIOGRAPHIC STUDIES: I have personally reviewed the radiological images as listed and agreed with the findings in the report. No results found.   ASSESSMENT & PLAN:  Olivia [name] is a 59 y.o. female with    1. Malignant neoplasm of upper-inner quadrant of right breast, invasive ductal carcinoma, pT2N0M0, Stage 1B, ER/PR: positive, HER2 negative, Grade 3, Oncotype RS 21 -She was diagnosed in 10/2016. She is s/p  b/l mastectomy and reconstruction. She did not need chemo based on Oncotype RS. She hadnode-negativemastectomy, therefore will not require adjuvant radiation -Had b/l mastectomy and does not need any mammograms. Will continue regular physical exams. -She started Letrozole on 12/18/16. Tolerating well withmild hot flashes and joint stiffness, which have improved lately. Plan to continue for 7 years. -She is clinically doing well. She has no current concerns. There is no clinical concern for recurrence. -Continue Surveillance and continue letrozole  -F/u in 6 months. She will f/u with her PCP in interim.    2. History of Right Breast Cancer, pT2N1M0, ER+/pr+/her2-, in 2003 -Treated by Dr. Eston Esters -s/p lumpectomy and axillary lymph node dissection (3/7 positive lymph nodes), chemo, radiation, tamoxifen for 2 years and Anastrozole for 3 years -s/p Total hysterectomy/BSO in 2006   3. Genetictestingwas negative  4. Osteopenia  -After being on Arimidex for 3 years she developed osteoporosis.  -She was previously on Fosamax for 5 years. Now she has been on Prolia  injection for over 2 years. She receives injections every 6 monthsthrough her PCP, will continue.  -02/02/17 DEXA showed significant improvement from osteoporosis to osteopenia. Lowest density at Right Femur Neck with a T-Score of -2 -She will continue Prolia Injections every 6 months through her PCP.Next DEXA in early 2021 which will be ordered this month.  -Continue calcium, vitamin D and exercise.    5. Thyroid Nodules -Seen on 12/24/16 CT Chest. Thyroid is prominent with R side nodularity on 01/22/17 exam.  -She was previously on thyroid replacement in the past for goiter.  -She will continue to f/u with her PCP Dr. Raoul Pitch.   6. H/o COVID19 (+)  -She rested positive for COVID in March 01, 2019. She was treated antibody injection and was able to recover well.  -Her husband who had it before her unfortunately died from [place] in 03-01-2019. He had advanced dementia was not able to recover.     PLAN: -She is clinically doing very well -Continue Letrozole daily -DEXA at PCP office this year  -Lab with PCP in 04/2019 -f/u and lab in 6 months.     No problem-specific Assessment & Plan notes found for this encounter.   No orders of the defined types were placed in this encounter.  I discussed the assessment and treatment plan with the patient. The patient was provided an opportunity to ask questions and all were answered. The patient agreed with the plan and demonstrated an understanding of the instructions.  The patient was advised to call back or seek an in-person evaluation if the symptoms worsen or if the condition fails to improve as anticipated.  The total time spent in the appointment was 20 minutes.    Truitt Merle, MD 05/05/2019   I, Joslyn Devon, am acting as scribe for Truitt Merle, MD.   I have reviewed the above documentation for accuracy and completeness, and I agree with the above.

## 2019-04-28 ENCOUNTER — Ambulatory Visit (INDEPENDENT_AMBULATORY_CARE_PROVIDER_SITE_OTHER): Payer: Self-pay

## 2019-04-28 ENCOUNTER — Other Ambulatory Visit: Payer: Self-pay

## 2019-04-28 VITALS — BP 114/76 | HR 73 | Temp 97.7°F | Wt 140.0 lb

## 2019-04-28 DIAGNOSIS — Z9013 Acquired absence of bilateral breasts and nipples: Secondary | ICD-10-CM

## 2019-04-28 DIAGNOSIS — Z9889 Other specified postprocedural states: Secondary | ICD-10-CM

## 2019-05-02 ENCOUNTER — Ambulatory Visit: Payer: BC Managed Care – PPO | Admitting: Hematology

## 2019-05-02 ENCOUNTER — Other Ambulatory Visit: Payer: BC Managed Care – PPO

## 2019-05-05 ENCOUNTER — Inpatient Hospital Stay: Payer: BC Managed Care – PPO | Attending: Hematology | Admitting: Hematology

## 2019-05-05 ENCOUNTER — Encounter: Payer: Self-pay | Admitting: Hematology

## 2019-05-05 DIAGNOSIS — C50211 Malignant neoplasm of upper-inner quadrant of right female breast: Secondary | ICD-10-CM | POA: Diagnosis not present

## 2019-05-05 DIAGNOSIS — Z170 Estrogen receptor positive status [ER+]: Secondary | ICD-10-CM

## 2019-05-10 ENCOUNTER — Ambulatory Visit (INDEPENDENT_AMBULATORY_CARE_PROVIDER_SITE_OTHER): Payer: BC Managed Care – PPO | Admitting: Family Medicine

## 2019-05-10 ENCOUNTER — Encounter: Payer: Self-pay | Admitting: Family Medicine

## 2019-05-10 ENCOUNTER — Other Ambulatory Visit: Payer: Self-pay

## 2019-05-10 VITALS — BP 127/82 | HR 67 | Temp 97.9°F | Resp 17 | Ht 64.0 in | Wt 141.1 lb

## 2019-05-10 DIAGNOSIS — E039 Hypothyroidism, unspecified: Secondary | ICD-10-CM | POA: Diagnosis not present

## 2019-05-10 DIAGNOSIS — M810 Age-related osteoporosis without current pathological fracture: Secondary | ICD-10-CM

## 2019-05-10 DIAGNOSIS — E782 Mixed hyperlipidemia: Secondary | ICD-10-CM | POA: Diagnosis not present

## 2019-05-10 DIAGNOSIS — Z79899 Other long term (current) drug therapy: Secondary | ICD-10-CM

## 2019-05-10 DIAGNOSIS — Z5181 Encounter for therapeutic drug level monitoring: Secondary | ICD-10-CM | POA: Diagnosis not present

## 2019-05-10 DIAGNOSIS — M1990 Unspecified osteoarthritis, unspecified site: Secondary | ICD-10-CM

## 2019-05-10 DIAGNOSIS — Z170 Estrogen receptor positive status [ER+]: Secondary | ICD-10-CM

## 2019-05-10 DIAGNOSIS — E042 Nontoxic multinodular goiter: Secondary | ICD-10-CM

## 2019-05-10 DIAGNOSIS — C50211 Malignant neoplasm of upper-inner quadrant of right female breast: Secondary | ICD-10-CM

## 2019-05-10 DIAGNOSIS — Z0000 Encounter for general adult medical examination without abnormal findings: Secondary | ICD-10-CM

## 2019-05-10 DIAGNOSIS — F4329 Adjustment disorder with other symptoms: Secondary | ICD-10-CM

## 2019-05-10 DIAGNOSIS — Z131 Encounter for screening for diabetes mellitus: Secondary | ICD-10-CM | POA: Diagnosis not present

## 2019-05-10 LAB — COMPREHENSIVE METABOLIC PANEL
ALT: 28 U/L (ref 0–35)
AST: 25 U/L (ref 0–37)
Albumin: 4.9 g/dL (ref 3.5–5.2)
Alkaline Phosphatase: 41 U/L (ref 39–117)
BUN: 17 mg/dL (ref 6–23)
CO2: 28 mEq/L (ref 19–32)
Calcium: 9.5 mg/dL (ref 8.4–10.5)
Chloride: 105 mEq/L (ref 96–112)
Creatinine, Ser: 0.87 mg/dL (ref 0.40–1.20)
GFR: 66.9 mL/min (ref >60.00)
Glucose, Bld: 88 mg/dL (ref 70–99)
Potassium: 4.1 mEq/L (ref 3.5–5.1)
Sodium: 141 mEq/L (ref 135–145)
Total Bilirubin: 0.6 mg/dL (ref 0.2–1.2)
Total Protein: 6.8 g/dL (ref 6.0–8.3)

## 2019-05-10 LAB — LIPID PANEL
Cholesterol: 122 mg/dL (ref 0–200)
HDL: 51.2 mg/dL (ref >39.00)
LDL Cholesterol: 55 mg/dL (ref 0–99)
NonHDL: 70.99
Total CHOL/HDL Ratio: 2
Triglycerides: 82 mg/dL (ref 0.0–149.0)
VLDL: 16.4 mg/dL (ref 0.0–40.0)

## 2019-05-10 LAB — CBC
HCT: 42 % (ref 36.0–46.0)
Hemoglobin: 14.3 g/dL (ref 12.0–15.0)
MCHC: 34.1 g/dL (ref 30.0–36.0)
MCV: 87.7 fl (ref 78.0–100.0)
Platelets: 223 10*3/uL (ref 150.0–400.0)
RBC: 4.79 Mil/uL (ref 3.87–5.11)
RDW: 13.8 % (ref 11.5–15.5)
WBC: 3.6 10*3/uL — ABNORMAL LOW (ref 4.0–10.5)

## 2019-05-10 LAB — VITAMIN D 25 HYDROXY (VIT D DEFICIENCY, FRACTURES): VITD: 44.28 ng/mL (ref 30.00–100.00)

## 2019-05-10 LAB — HEMOGLOBIN A1C: Hgb A1c MFr Bld: 5.7 % (ref 4.6–6.5)

## 2019-05-10 LAB — TSH: TSH: 2.02 u[IU]/mL (ref 0.35–4.50)

## 2019-05-10 MED ORDER — DICLOFENAC SODIUM 75 MG PO TBEC: 75.0000 mg | DELAYED_RELEASE_TABLET | Freq: Two times a day (BID) | ORAL | 5 refills | Status: DC

## 2019-05-10 NOTE — Progress Notes (Signed)
This visit occurred during the SARS-CoV-2 public health emergency.  Safety protocols were in place, including screening questions prior to the visit, additional usage of staff PPE, and extensive cleaning of exam room while observing appropriate contact time as indicated for disinfecting solutions.    Patient ID: SAIYA CRIST, female  DOB: Sep 09, 1960, 59 y.o.   MRN: [id number] Patient Care Team    Relationship Specialty Notifications Start End  Ma Hillock, DO PCP - General Family Medicine  12/26/14   Truitt Merle, MD Consulting Physician Hematology  10/15/16   Jovita Kussmaul, MD Consulting Physician General Surgery  10/15/16   Gery Pray, MD Consulting Physician Radiation Oncology  10/15/16   Sable Feil, MD Consulting Physician Gastroenterology  04/14/17   Molli Posey, MD Consulting Physician Obstetrics and Gynecology  04/14/17   Gardenia Phlegm, NP Nurse Practitioner Hematology and Oncology  07/08/17   Elayne Snare, MD Consulting Physician Endocrinology  04/22/18    Comment: thyroid nodules    Chief Complaint  Patient presents with  . Annual Exam    Not fasting. GYN 07/2018.     Subjective:  BRECKEN WALTH is a 59 y.o.  Female  present for CPE. All past medical history, surgical history, allergies, family history, immunizations, medications and social history were updated in the electronic medical record today. All recent labs, ED visits and hospitalizations within the last year were reviewed. Husband passed away early this year from [place] 45 and complications with his Dementia.   Health maintenance:  Colonoscopy: completedbyDr. Sharlett Iles January 2014. Ten-year follow-up. Mammogram: Double mastectomy 09/30/2016, patient being treated for recurrent breast cancer. Cervical cancer screening: last KZS:WFUXNAT of hysterectomy, pelvic exams completed by Dr. Grayling Congress. Immunizations: tdapUTD 2013, InfluenzaUTD 2020(encouraged yearly). Shingrix series  completed. covid she is scheduling.  Infectious disease screening: HIVandHep C completed DEXA:History of osteoporosis, on prolia injections every 6 months. Last DEXA scan 02/02/2017 improving -2.0 ([facility]- ordered by speciliast team).Follow-up every 2 years Assistive device: none Oxygen FTD:DUKG Patient has a Dental home. Hospitalizations/ED visits: reviewed Arthritis:  She does not feel her mobic is working as well, She would like to try another medication.   Adjustment d/o:  Doing much better now. Rarely using trazodone.   Prior note: THY GULLIKSON is a 59 y.o. female present for sleep disturbance after loss of her husband 02/14/2019 secondary to COVID-19.  Patient reports her husband was discharged on hospice on 02/10/2018.  He was kept comfortable in the home and had a peaceful passing.  She reports since that time she has had great difficulty falling asleep and maintaining sleep.  She reports she seems to be unable to stop thinking or turning off her brain when it is time to go to bed.  She also has noted she has gotten out of her routine.  She states she has great support with her family and friends.  She is hoping to get back to her exercise class very soon.  She believes she is overall coping and adjusting day-to-day  Depression screen [facility] 2/9 05/10/2019 04/22/2018 04/14/2017 01/29/2012  Decreased Interest 0 0 0 0  Down, Depressed, Hopeless 0 0 0 0  PHQ - 2 Score 0 0 0 0  Altered sleeping - 0 - -  Tired, decreased energy - 0 - -  Change in appetite - 0 - -  Feeling bad or failure about yourself  - 0 - -  Trouble concentrating - 0 - -  Moving slowly or fidgety/restless - 0 - -  Suicidal thoughts - 0 - -  PHQ-9 Score - 0 - -  Difficult doing work/chores - Not difficult at all - -   GAD 7 : Generalized Anxiety Score 04/22/2018  Nervous, Anxious, on Edge 1  Control/stop worrying 1  Worry too much - different things 1  Trouble relaxing 0  Restless 0  Easily annoyed or irritable 1   Afraid - awful might happen 0  Total GAD 7 Score 4  Anxiety Difficulty Not difficult at all    Immunization History  Administered Date(s) Administered  . Influenza Split 11/26/2011  . Influenza,inj,Quad PF,6+ Mos 11/20/2013, 10/20/2018  . Influenza-Unspecified 12/05/2014, 10/03/2015, 11/10/2016, 10/04/2017  . Tdap 01/29/2012  . Zoster Recombinat (Shingrix) 06/03/2017, 08/18/2017    Past Medical History:  Diagnosis Date  . Adjustment insomnia 02/23/2019  . Breast cancer ([place]) 2018   Lumpectomy, chemotherapy and radiation with recurrence 2018. Now on Femara  . COVID-19 02/2019   Her husband also passed away at this time from COVID-19.  . Family history of breast cancer   . Genetic testing 10/30/2016   Breast/GYN panel (23 genes) @ [facility] - No pathogenic mutations detected  . GERD (gastroesophageal reflux disease)    OTC medication  . Grief reaction 02/23/2019  . Heart murmur    as a child - Echo in early 20s "ok"  . History of echocardiogram 05/2015   Echo 4/17: EF 55-60%, GLS -19.6%, normal wall motion  . History of exercise stress test 05/2015   ETT 4/17 - No ST changes  . HLD (hyperlipidemia)   . Multiple thyroid nodules 2009   Multiple nodules: Normal ultrasound reported at diagnosis  . Osteoporosis   . Personal history of chemotherapy   . Personal history of radiation therapy    No Known Allergies Past Surgical History:  Procedure Laterality Date  . BREAST LUMPECTOMY Right 2003  . BREAST RECONSTRUCTION WITH PLACEMENT OF TISSUE EXPANDER AND FLEX HD (ACELLULAR HYDRATED DERMIS) Bilateral 11/17/2016   Procedure: IMEDITATE BREAST RECONSTRUCTION WITH PLACEMENT OF TISSUE EXPANDER AND FLEX HD (ACELLULAR HYDRATED DERMIS);  Surgeon: Wallace Going, DO;  Location: [facility];  Service: Plastics;  Laterality: Bilateral;  . BREAST SURGERY  2003 /right   lumpectomy; chemotherapy and radiation  . REMOVAL OF BILATERAL TISSUE EXPANDERS WITH PLACEMENT OF BILATERAL BREAST IMPLANTS  Bilateral 02/11/2017   Procedure: REMOVAL OF BILATERAL TISSUE EXPANDERS WITH PLACEMENT OF BILATERAL SILICONE BREAST IMPLANTS;  Surgeon: Wallace Going, DO;  Location: [facility];  Service: Plastics;  Laterality: Bilateral;  . TOTAL ABDOMINAL HYSTERECTOMY W/ BILATERAL SALPINGOOPHORECTOMY     for treatment of breast treatment  . TOTAL MASTECTOMY Bilateral 11/17/2016   Procedure: RIGHT MASTECTOMY AND LEFT PROPHYLATIC MASTECTOMY;  Surgeon: Jovita Kussmaul, MD;  Location[facility];  Service: General;  Laterality: Bilateral;   Family History  Problem Relation Age of Onset  . Diabetes Other        1ST DEGREE RELATIVE  . Coronary artery disease Mother        1ST DEGREE RELATIVE  . Heart attack Mother 28  . Skin cancer Mother   . Diabetes Father   . Kidney disease Father   . Prostate cancer Father 34       deceased 85  . Cancer Brother 71       multiple myeloma   . Lung cancer Maternal Uncle        in 2 mat uncles  . Breast cancer Paternal Aunt 59  . Breast cancer Cousin  dx 69s; daughter of unaffected paternal aunt  . Breast cancer Cousin        dx 80s; another daughter of unaffected paternal aunt  . Esophageal cancer Cousin        pat first cousin   Social History   Social History Narrative   Married. Bobby.   Children: none. No pets.    Stay at home.    Wears her seatbelt. Smoke alarm in the home.    Feels safe in her relationships.    Exercise 5x week.     Allergies as of 05/10/2019   No Known Allergies     Medication List       Accurate as of May 10, 2019 11:09 AM. If you have any questions, ask your nurse or doctor.        STOP taking these medications   meloxicam 15 MG tablet Commonly known as: MOBIC Stopped by: Howard Pouch, DO   traZODone 50 MG tablet Commonly known as: DESYREL Stopped by: Howard Pouch, DO     TAKE these medications   atorvastatin 20 MG tablet Commonly known as: LIPITOR Take 1 tablet (20 mg total) by mouth daily.    Calcium 500 MG Chew Chew by mouth.   cetirizine 10 MG tablet Commonly known as: ZYRTEC Take 10 mg by mouth as needed.   denosumab 60 MG/ML Soln injection Commonly known as: PROLIA Inject 60 mg into the skin every 6 (six) months. Administer in upper arm, thigh, or abdomen   diclofenac 75 MG EC tablet Commonly known as: VOLTAREN Take 1 tablet (75 mg total) by mouth 2 (two) times daily. Started by: Howard Pouch, DO   letrozole 2.5 MG tablet Commonly known as: FEMARA TAKE 1 TABLET BY MOUTH EVERY DAY   PROBIOTIC ADVANCED PO Take by mouth daily.   psyllium 58.6 % powder Commonly known as: METAMUCIL Take 1 packet by mouth daily.   Vitamin D (Cholecalciferol) 25 MCG (1000 UT) Caps Take 1 capsule by mouth daily.       All past medical history, surgical history, allergies, family history, immunizations andmedications were updated in the EMR today and reviewed under the history and medication portions of their EMR.     No results found for this or any previous visit (from the past 2160 hour(s)).  No results found.   ROS: 14 pt review of systems performed and negative (unless mentioned in an HPI)  Objective: BP 127/82 (BP Location: Left Arm, Patient Position: Sitting, Cuff Size: Normal)   Pulse 67   Temp 97.9 F (36.6 C) (Temporal)   Resp 17   Ht '5\' 4"'$  (1.626 m)   Wt 141 lb 2 oz (64 kg)   SpO2 95%   BMI 24.22 kg/m  Gen: Afebrile. No acute distress. Nontoxic in appearance, well-developed, well-nourished,  female HENT: AT. [place]. Bilateral TM visualized and normal in appearance, normal external auditory canal. MMM, no oral lesions, adequate dentition. Bilateral nares within normal limits. Throat without erythema, ulcerations or exudates. no Cough on exam, no hoarseness on exam. Eyes:Pupils Equal Round Reactive to light, Extraocular movements intact,  Conjunctiva without redness, discharge or icterus. Neck/lymp/endocrine: Supple,no lymphadenopathy, no thyromegaly CV: RRR no  murmur, no edema, +2/4 P posterior tibialis pulses. Chest: CTAB, no wheeze, rhonchi or crackles. normal Respiratory effort. good Air movement. Abd: Soft. flat. NTND. BS present. no Masses palpated. No hepatosplenomegaly. No rebound tenderness or guarding. Skin: no rashes, purpura or petechiae. Warm and well-perfused. Skin intact. Neuro/Msk:  Normal gait. PERLA.  EOMi. Alert. Oriented x3.  Cranial nerves II through XII intact. Muscle strength 5/5 upper/lower extremity. DTRs equal bilaterally. Very mild thickening and lifting of 2nd toenail bilaterally.  Psych: Normal affect, dress and demeanor. Normal speech. Normal thought content and judgment.   No exam data present  Assessment/plan: HENDEL GATLIFF is a 59 y.o. female present for CPE Multiple thyroid nodules Rpt [place] due 01/2022. Had been following w/ Dr. Dwyane Dee - TSH  Osteoporosis, unspecified osteoporosis type, unspecified pathological fracture presence Continue prolia q 6 months.  Continue vit d supplement - DG Bone Density; Future - Vitamin D (25 hydroxy)  Malignant neoplasm of upper-inner quadrant of right breast in female, estrogen receptor positive ([place]) Routinely follows with heme-onc and GYN- prescribed femara  Mixed hyperlipidemia/statin therapy Continue Lipitor 20 - CBC - Comprehensive metabolic panel - Lipid panel - TSH  Diabetes mellitus screening - Hemoglobin A1c  Arthritis DC mobic Trial of Voltaren BID  Encounter for preventive health examination Patient was encouraged to exercise greater than 150 minutes a week. Patient was encouraged to choose a diet filled with fresh fruits and vegetables, and lean meats. AVS provided to patient today for education/recommendation on gender specific health and safety maintenance. Colonoscopy: completedbyDr. Sharlett Iles January 2014. Ten-year follow-up. Mammogram: Double mastectomy 09/30/2016, patient being treated for recurrent breast cancer. Cervical cancer screening: last  VCB:SWHQPRF of hysterectomy, pelvic exams completed by Dr. Grayling Congress. Immunizations: tdapUTD 2013, InfluenzaUTD 2020(encouraged yearly). Shingrix series completed. covid she is scheduling.  Infectious disease screening: HIVandHep C completed DEXA:History of osteoporosis, > ordered today  Return in about 6 months (around 11/09/2019) for [place] (15 min).   Orders Placed This Encounter  Procedures  . DG Bone Density  . CBC  . Comprehensive metabolic panel  . Hemoglobin A1c  . Lipid panel  . TSH  . Vitamin D (25 hydroxy)   Meds ordered this encounter  Medications  . diclofenac (VOLTAREN) 75 MG EC tablet    Sig: Take 1 tablet (75 mg total) by mouth 2 (two) times daily.    Dispense:  60 tablet    Refill:  5    Please DC mobic.   Referral Orders  No referral(s) requested today     Electronically signed by: Howard Pouch, [place]

## 2019-05-10 NOTE — Patient Instructions (Signed)
COVID-19 Vaccine Information can be found at: [email or web address] For questions related to vaccine distribution or appointments, please email [email or web address] or call [phone].  Covid Vaccine appointment go to [email or web address].   Health Maintenance, Female Adopting a healthy lifestyle and getting preventive care are important in promoting health and wellness. Ask your health care provider about:  The right schedule for you to have regular tests and exams.  Things you can do on your own to prevent diseases and keep yourself healthy. What should I know about diet, weight, and exercise? Eat a healthy diet   Eat a diet that includes plenty of vegetables, fruits, low-fat dairy products, and lean protein.  Do not eat a lot of foods that are high in solid fats, added sugars, or sodium. Maintain a healthy weight Body mass index (BMI) is used to identify weight problems. It estimates body fat based on height and weight. Your health care provider can help determine your BMI and help you achieve or maintain a healthy weight. Get regular exercise Get regular exercise. This is one of the most important things you can do for your health. Most adults should:  Exercise for at least 150 minutes each week. The exercise should increase your heart rate and make you sweat (moderate-intensity exercise).  Do strengthening exercises at least twice a week. This is in addition to the moderate-intensity exercise.  Spend less time sitting. Even light physical activity can be beneficial. Watch cholesterol and blood lipids Have your blood tested for lipids and cholesterol at 58 years of age, then have this test every 5 years. Have your cholesterol levels checked more often if:  Your lipid or cholesterol levels are high.  You are older than 58 years of age.  You are at high risk for heart disease. What should I know about cancer  screening? Depending on your health history and family history, you may need to have cancer screening at various ages. This may include screening for:  Breast cancer.  Cervical cancer.  Colorectal cancer.  Skin cancer.  Lung cancer. What should I know about heart disease, diabetes, and high blood pressure? Blood pressure and heart disease  High blood pressure causes heart disease and increases the risk of stroke. This is more likely to develop in people who have high blood pressure readings, are of African descent, or are overweight.  Have your blood pressure checked: ? Every 3-5 years if you are 18-39 years of age. ? Every year if you are 40 years old or older. Diabetes Have regular diabetes screenings. This checks your fasting blood sugar level. Have the screening done:  Once every three years after age 40 if you are at a normal weight and have a low risk for diabetes.  More often and at a younger age if you are overweight or have a high risk for diabetes. What should I know about preventing infection? Hepatitis B If you have a higher risk for hepatitis B, you should be screened for this virus. Talk with your health care provider to find out if you are at risk for hepatitis B infection. Hepatitis C Testing is recommended for:  Everyone born from 1945 through 1965.  Anyone with known risk factors for hepatitis C. Sexually transmitted infections (STIs)  Get screened for STIs, including gonorrhea and chlamydia, if: ? You are sexually active and are younger than 58 years of age. ? You are older than 58 years of age and your health care provider tells you that you are at risk for   this type of infection. ? Your sexual activity has changed since you were last screened, and you are at increased risk for chlamydia or gonorrhea. Ask your health care provider if you are at risk.  Ask your health care provider about whether you are at high risk for HIV. Your health care provider may  recommend a prescription medicine to help prevent HIV infection. If you choose to take medicine to prevent HIV, you should first get tested for HIV. You should then be tested every 3 months for as long as you are taking the medicine. Pregnancy  If you are about to stop having your period (premenopausal) and you may become pregnant, seek counseling before you get pregnant.  Take 400 to 800 micrograms (mcg) of folic acid every day if you become pregnant.  Ask for birth control (contraception) if you want to prevent pregnancy. Osteoporosis and menopause Osteoporosis is a disease in which the bones lose minerals and strength with aging. This can result in bone fractures. If you are 65 years old or older, or if you are at risk for osteoporosis and fractures, ask your health care provider if you should:  Be screened for bone loss.  Take a calcium or vitamin D supplement to lower your risk of fractures.  Be given hormone replacement therapy (HRT) to treat symptoms of menopause. Follow these instructions at home: Lifestyle  Do not use any products that contain nicotine or tobacco, such as cigarettes, e-cigarettes, and chewing tobacco. If you need help quitting, ask your health care provider.  Do not use street drugs.  Do not share needles.  Ask your health care provider for help if you need support or information about quitting drugs. Alcohol use  Do not drink alcohol if: ? Your health care provider tells you not to drink. ? You are pregnant, may be pregnant, or are planning to become pregnant.  If you drink alcohol: ? Limit how much you use to 0-1 drink a day. ? Limit intake if you are breastfeeding.  Be aware of how much alcohol is in your drink. In the [place] one drink equals one 12 oz bottle of beer (355 mL), one 5 oz glass of wine (148 mL), or one 1 oz glass of hard liquor (44 mL). General instructions  Schedule regular health, dental, and eye exams.  Stay current with your  vaccines.  Tell your health care provider if: ? You often feel depressed. ? You have ever been abused or do not feel safe at home. Summary  Adopting a healthy lifestyle and getting preventive care are important in promoting health and wellness.  Follow your health care provider's instructions about healthy diet, exercising, and getting tested or screened for diseases.  Follow your health care provider's instructions on monitoring your cholesterol and blood pressure. This information is not intended to replace advice given to you by your health care provider. Make sure you discuss any questions you have with your health care provider. Document Revised: 01/13/2018 Document Reviewed: 01/13/2018 Elsevier Patient Education  2020 Elsevier Inc.   

## 2019-05-12 ENCOUNTER — Telehealth: Payer: Self-pay | Admitting: Hematology

## 2019-05-12 ENCOUNTER — Ambulatory Visit: Payer: BC Managed Care – PPO | Attending: Internal Medicine

## 2019-05-12 DIAGNOSIS — Z23 Encounter for immunization: Secondary | ICD-10-CM

## 2019-05-12 NOTE — Telephone Encounter (Signed)
Scheduled per 04/01 los, patient has been called and notified.

## 2019-05-12 NOTE — Progress Notes (Signed)
   Covid-19 Vaccination Clinic  Name:  ZEENA CEFALO    MRN: [id number] DOB: Sep 10, 1960  05/12/2019  Ms. Mogel was observed post Covid-19 immunization for 15 minutes without incident. She was provided with Vaccine Information Sheet and instruction to access the V-Safe system.   Ms. Cheah was instructed to call 911 with any severe reactions post vaccine: Marland Kitchen Difficulty breathing  . Swelling of face and throat  . A fast heartbeat  . A bad rash all over body  . Dizziness and weakness   Immunizations Administered    Name Date Dose VIS Date Route   Pfizer COVID-19 Vaccine 05/12/2019  8:41 AM 0.3 mL 01/14/2019 Intramuscular   Manufacturer: [place]   Lot: [id number]   [place]: [id number]

## 2019-05-26 NOTE — Progress Notes (Signed)
NIPPLE AREOLAR TATTOO PROCEDURE  PREOPERATIVE DIAGNOSIS:  Acquired absence of bilateral nipple areolar   POSTOPERATIVE DIAGNOSIS: Acquired absence of bilateral nipple areolar    PROCEDURES: bilateral nipple areolar tattoo   ATTENDING SURGEON: Dr. Lyndee Leo Sanger Dillingham  ANESTHESIA:  EMLA  COMPLICATIONS: None.  JUSTIFICATION FOR PROCEDURE:  Olivia [name] is a 59 y.o. female with a history of breast cancer status post breast reconstruction. The patient presents for bilateral  nipple areolar complex tattoo. Risks, benefits, indications, and alternatives of the above described procedures were discussed with the patient and all the patient's questions were answered.     DESCRIPTION OF PROCEDURE: After informed consent was obtained and proper identification of patient and surgical site was made, the patient was taken to the procedure room.  A time out was performed to confirm patient's identity and surgical site.  Pre-procedure photos were obtained & entered into chart   Areola/nipple placement, measurements, & shape were finalized with pt's approval. Color/shades chosen/approved by the pt  Pt was placed in supine position EMLA cream removed & area was prepped & draped in the usual sterile fashion.   Using a (7) round & a (9) medium/straight tattoo head on the [employer]/Digital Pop Deluxe tattoo handpiece/machine pigment was instilled to the designed nipple areolar complex, which was confirmed preoperatively with the patient.   Once adequate pigment had been applied to the nipple areolar complex, vaseline/gauze dressing was applied.   Post -procedure photos were obtained & entered into chart The patient tolerated the procedure well.  Post-care instructions were reviewed and given to pt Pt will call for any concerns F/u in 6 weeks for touch-up if needed  Duration- topical anesthesia liquid applied as needed during the procedure  Pigment used: [employer] Tattoo Ink Colors:  Christianne Dolin- lot# [id number] exp. 01/12/2022 Josph Macho Skin- lot# [id number] exp. 09/18/2022 Eartha Inch- lot# [id number]. 01/12/2022 Cool Honey-lot#[id number]/ exp. 11/02/2021 Chipper [place]- lot#[id number]/ exp. 01/12/2022  sterile eye wash solution used to dilute/mix colors as needed

## 2019-05-30 NOTE — Patient Instructions (Signed)
Keep area clean- apply vaseline & gauze csll for any concerns F/u in 6 weeks for touch-up tattoo

## 2019-06-06 ENCOUNTER — Ambulatory Visit: Payer: BC Managed Care – PPO | Attending: Internal Medicine

## 2019-06-06 DIAGNOSIS — Z23 Encounter for immunization: Secondary | ICD-10-CM

## 2019-06-06 NOTE — Progress Notes (Signed)
   Covid-19 Vaccination Clinic  Name:  Olivia [name]    MRN: [id number] DOB: [birth year]  06/06/2019  [name] was observed post Covid-19 immunization for 15 minutes without incident. She was provided with Vaccine Information Sheet and instruction to access the V-Safe system.   [name] was instructed to call 911 with any severe reactions post vaccine: Marland Kitchen Difficulty breathing  . Swelling of face and throat  . A fast heartbeat  . A bad rash all over body  . Dizziness and weakness   Immunizations Administered    Name Date Dose VIS Date Route   Pfizer COVID-19 Vaccine 06/06/2019  8:30 AM 0.3 mL 03/30/2018 Intramuscular   Manufacturer: [place]   Lot: [id number]   [place]: [id number]

## 2019-06-08 DIAGNOSIS — Z719 Counseling, unspecified: Secondary | ICD-10-CM

## 2019-06-10 ENCOUNTER — Ambulatory Visit (INDEPENDENT_AMBULATORY_CARE_PROVIDER_SITE_OTHER): Payer: BC Managed Care – PPO | Admitting: Plastic Surgery

## 2019-06-10 ENCOUNTER — Other Ambulatory Visit: Payer: Self-pay

## 2019-06-10 ENCOUNTER — Encounter: Payer: Self-pay | Admitting: Plastic Surgery

## 2019-06-10 VITALS — BP 120/83 | HR 67 | Temp 97.1°F | Ht 64.0 in | Wt 138.6 lb

## 2019-06-10 DIAGNOSIS — Z9013 Acquired absence of bilateral breasts and nipples: Secondary | ICD-10-CM

## 2019-06-10 DIAGNOSIS — Z9889 Other specified postprocedural states: Secondary | ICD-10-CM | POA: Diagnosis not present

## 2019-06-10 NOTE — Progress Notes (Signed)
   Subjective:    Patient ID: Olivia [name], female    DOB: 19-Feb-1960, 59 y.o.   MRN: [id number]  The patient is a 59 year old white female here for follow-up on her breast surgery reconstruction.  She had bilateral mastectomies with expanders.  She then had implants placed she has 350 cc Mentor smooth round implants.  Overall she is doing extremely well.  Considering that the right breast was radiated she is looking very nice.  She is noticing loss of volume in the upper pole of both breasts.  This is not a surprise and it is a comment occurrence after the implants have been placed.  She has done the first stage of her tattooing as well.     Review of Systems  Constitutional: Negative.   HENT: Negative.   Eyes: Negative.   Respiratory: Negative.   Cardiovascular: Negative.   Gastrointestinal: Negative.   Endocrine: Negative.   Genitourinary: Negative.   Musculoskeletal: Negative.   Hematological: Negative.        Objective:   Physical Exam Vitals and nursing note reviewed.  Constitutional:      Appearance: Normal appearance.  Cardiovascular:     Rate and Rhythm: Normal rate.     Pulses: Normal pulses.  Pulmonary:     Effort: Pulmonary effort is normal.  Neurological:     General: No focal deficit present.     Mental Status: She is alert and oriented to person, place, and time.  Psychiatric:        Mood and Affect: Mood normal.        Behavior: Behavior normal.        Thought Content: Thought content normal.        Assessment & Plan:     ICD-10-CM   1. Acquired absence of bilateral breasts and nipples  Z90.13   2. Status post bilateral breast reconstruction  Z98.890      Recommend lipoma filling for improved contour and breast asymmetry after reconstruction.  We will get her set up for her second set of tattoos for her nipple reconstruction.  She is also interested in laser for her neck area.  We also talked to her about the Sente neck firming cream which she got  today.  I think that will be a big help for her.  We did the NdYag laser on several hemangiomas.  These should clear without any further treatment.  Pictures were obtained of the patient and placed in the chart with the patient's or guardian's permission.

## 2019-06-20 DIAGNOSIS — M542 Cervicalgia: Secondary | ICD-10-CM | POA: Diagnosis not present

## 2019-07-05 DIAGNOSIS — M5412 Radiculopathy, cervical region: Secondary | ICD-10-CM | POA: Diagnosis not present

## 2019-07-05 HISTORY — DX: Radiculopathy, cervical region: M54.12

## 2019-07-08 ENCOUNTER — Ambulatory Visit: Payer: BC Managed Care – PPO | Admitting: Podiatry

## 2019-07-08 ENCOUNTER — Encounter: Payer: Self-pay | Admitting: Podiatry

## 2019-07-08 ENCOUNTER — Other Ambulatory Visit: Payer: Self-pay

## 2019-07-08 DIAGNOSIS — L600 Ingrowing nail: Secondary | ICD-10-CM | POA: Diagnosis not present

## 2019-07-08 MED ORDER — NEOMYCIN-POLYMYXIN-HC 1 % OT SOLN: OTIC | 1 refills | Status: DC

## 2019-07-08 NOTE — Patient Instructions (Signed)

## 2019-07-08 NOTE — Progress Notes (Signed)
She presents today chief complaint of ingrown toenail to the medial border of the left hallux.  Objective: Vitals are stable oriented x3 mild erythema edema.  No cellulitis drainage or odor.  No purulence no malodor pulses remain strong and palpable left.  Assessment: Ingrown toenail tibial border of the hallux left.  Plan: Chemical matricectomy was performed today after local anesthetic was administered tolerated procedure well without complications.  Provided both oral and written home-going structure for care and soaking of the toe as well as a prescription for Cortisporin Otic to be applied twice daily after soaking.  Follow-up with her in 2 weeks

## 2019-07-11 DIAGNOSIS — M5412 Radiculopathy, cervical region: Secondary | ICD-10-CM | POA: Diagnosis not present

## 2019-07-13 DIAGNOSIS — M5412 Radiculopathy, cervical region: Secondary | ICD-10-CM | POA: Diagnosis not present

## 2019-07-18 ENCOUNTER — Ambulatory Visit (INDEPENDENT_AMBULATORY_CARE_PROVIDER_SITE_OTHER): Payer: BC Managed Care – PPO

## 2019-07-18 ENCOUNTER — Other Ambulatory Visit: Payer: Self-pay

## 2019-07-18 VITALS — BP 124/78 | HR 68 | Temp 97.8°F | Wt 136.0 lb

## 2019-07-18 DIAGNOSIS — Z9013 Acquired absence of bilateral breasts and nipples: Secondary | ICD-10-CM | POA: Diagnosis not present

## 2019-07-18 DIAGNOSIS — Z9889 Other specified postprocedural states: Secondary | ICD-10-CM

## 2019-07-18 NOTE — Patient Instructions (Signed)
Pt will use vaseline/gauze dressing & will call for any concerns She will f/u in approx 6 weeks for touch-up if needed

## 2019-07-18 NOTE — Progress Notes (Signed)
NIPPLE AREOLAR TATTOO PROCEDURE  PREOPERATIVE DIAGNOSIS:  Acquired absence of BILATERAL nipple areolar   POSTOPERATIVE DIAGNOSIS: Acquired absence of BILATERAL nipple areolar    PROCEDURES: BILATERAL nipple areolar tattoo   ATTENDING SURGEON: Dr. Lyndee Leo Sanger   ANESTHESIA:  EMLA  COMPLICATIONS: None.  JUSTIFICATION FOR PROCEDURE:  Ms. Bunten is a 59 y.o. female with a history of breast cancer status post bilateral breast reconstruction.  The patient presents for nipple areolar complex tattoo touch-up Risks, benefits, indications, and alternatives of the above described procedures were discussed with the patient and all the patient's questions were answered.   DESCRIPTION OF PROCEDURE: After informed consent was obtained and proper identification of patient and surgical site was made, the patient was taken to the procedure room and pre-procedure photos were taken & entered into chart Pt was placed in supine position on the operating room table.  A time out was performed to confirm patient's identity and surgical site.  The patient was prepped and draped in the usual sterile fashion. Pt chose the  appropriate colors/shades for nipple areolar complex tattooing. Using a #7 tattoo head, on the [employer] Pop machine- pigment was instilled to the designed nipple areolar .  Once adequate pigment had been applied to the nipple areolar complex- a post procedure photo was obtained & entered into the chart  vaseline & gauze  dressing was applied. The patient tolerated the procedure well.  Post procedure instructions reviewed & pt will f/u in 6 weeks for touch-up if needed  Ink used: World Famous Tattoo Ink: Bright Peach- FKC#[id number] / exp 01/12/2022 Darcella Gasman- SWH#[id number] / exp 01/12/2022 Josph Macho Skin- LDJ#[id number] / exp. 09/18/2022 Cool Mink- lot# [id number] / exp. 01/12/2022 Warm Honey- lot# [id number] / exp. 01/12/2022 Chipper Oman- lot# [id number] / exp.  01/12/2022 Sterile eyewash was used as needed for blending & diluting colors Duration/topical anesthesia was used as needed

## 2019-07-19 DIAGNOSIS — M5412 Radiculopathy, cervical region: Secondary | ICD-10-CM | POA: Diagnosis not present

## 2019-07-20 DIAGNOSIS — Z6824 Body mass index (BMI) 24.0-24.9, adult: Secondary | ICD-10-CM | POA: Diagnosis not present

## 2019-07-20 DIAGNOSIS — Z01419 Encounter for gynecological examination (general) (routine) without abnormal findings: Secondary | ICD-10-CM | POA: Diagnosis not present

## 2019-07-25 ENCOUNTER — Ambulatory Visit
Admission: RE | Admit: 2019-07-25 | Discharge: 2019-07-25 | Disposition: A | Discharge status: Home / Self Care | Payer: BC Managed Care – PPO | Source: Ambulatory Visit | Attending: Family Medicine | Admitting: Family Medicine

## 2019-07-25 ENCOUNTER — Other Ambulatory Visit: Payer: Self-pay

## 2019-07-25 DIAGNOSIS — Z780 Asymptomatic menopausal state: Secondary | ICD-10-CM | POA: Diagnosis not present

## 2019-07-25 DIAGNOSIS — M5412 Radiculopathy, cervical region: Secondary | ICD-10-CM | POA: Diagnosis not present

## 2019-07-25 DIAGNOSIS — M810 Age-related osteoporosis without current pathological fracture: Secondary | ICD-10-CM

## 2019-07-25 DIAGNOSIS — M85851 Other specified disorders of bone density and structure, right thigh: Secondary | ICD-10-CM | POA: Diagnosis not present

## 2019-07-27 DIAGNOSIS — M5412 Radiculopathy, cervical region: Secondary | ICD-10-CM | POA: Diagnosis not present

## 2019-07-28 ENCOUNTER — Other Ambulatory Visit: Payer: Self-pay

## 2019-07-28 ENCOUNTER — Ambulatory Visit: Payer: BC Managed Care – PPO | Admitting: Podiatry

## 2019-07-28 VITALS — Temp 97.2°F

## 2019-07-28 DIAGNOSIS — L600 Ingrowing nail: Secondary | ICD-10-CM

## 2019-07-28 DIAGNOSIS — Z9889 Other specified postprocedural states: Secondary | ICD-10-CM

## 2019-07-28 MED ORDER — NEOMYCIN-POLYMYXIN-HC 1 % OT SOLN: OTIC | 1 refills | Status: DC

## 2019-07-31 NOTE — Progress Notes (Signed)
She presents today for follow-up of nail check hallux right.  Denies fever chills nausea vomiting muscle aches pains states she is doing just fine.  Objective: Vital signs are stable alert oriented x3.  There is no erythema edema cellulitis drainage or odor.  Assessment: Well-healing surgical toe hallux right.  Plan: Follow-up with me in 1 month if necessary.  Otherwise I recommended that she continue soaking Epson salts and warm water and applying a small amount of her neomycin and Band-Aids.

## 2019-08-01 ENCOUNTER — Ambulatory Visit: Payer: BC Managed Care – PPO | Admitting: Hematology

## 2019-08-01 ENCOUNTER — Other Ambulatory Visit: Payer: BC Managed Care – PPO

## 2019-08-01 DIAGNOSIS — M5412 Radiculopathy, cervical region: Secondary | ICD-10-CM | POA: Diagnosis not present

## 2019-08-03 DIAGNOSIS — M5412 Radiculopathy, cervical region: Secondary | ICD-10-CM | POA: Diagnosis not present

## 2019-08-09 ENCOUNTER — Other Ambulatory Visit: Payer: Self-pay | Admitting: Family Medicine

## 2019-08-09 DIAGNOSIS — E782 Mixed hyperlipidemia: Secondary | ICD-10-CM

## 2019-08-09 MED ORDER — ATORVASTATIN CALCIUM 20 MG PO TABS: 20.0000 mg | ORAL_TABLET | Freq: Every day | ORAL | 1 refills | Status: DC

## 2019-08-29 ENCOUNTER — Ambulatory Visit (INDEPENDENT_AMBULATORY_CARE_PROVIDER_SITE_OTHER): Payer: BC Managed Care – PPO

## 2019-08-29 ENCOUNTER — Other Ambulatory Visit: Payer: Self-pay

## 2019-08-29 VITALS — BP 126/81 | HR 75 | Temp 97.6°F | Wt 135.0 lb

## 2019-08-29 DIAGNOSIS — Z9013 Acquired absence of bilateral breasts and nipples: Secondary | ICD-10-CM

## 2019-09-27 DIAGNOSIS — Z719 Counseling, unspecified: Secondary | ICD-10-CM

## 2019-10-06 ENCOUNTER — Telehealth: Payer: Self-pay | Admitting: Family Medicine

## 2019-10-06 NOTE — Telephone Encounter (Signed)
Completed [employer] prior [place] for her and return to Jacklynn Ganong, [place] workstation.

## 2019-10-07 ENCOUNTER — Ambulatory Visit: Payer: BC Managed Care – PPO

## 2019-10-07 NOTE — Telephone Encounter (Addendum)
Attempted to call [facility] to discuss Prolia approval per form.  She states unless the medication is to treat cancer that it was not necessary for [place] to need to contact [facility].  Form faxed to [place] dept. Fax confirmation received.

## 2019-10-11 NOTE — Telephone Encounter (Signed)
Received insurance approval today.  Patient scheduled to come into office tomorrow for Prolia injection.

## 2019-10-14 ENCOUNTER — Ambulatory Visit (INDEPENDENT_AMBULATORY_CARE_PROVIDER_SITE_OTHER): Payer: BC Managed Care – PPO | Admitting: Family Medicine

## 2019-10-14 ENCOUNTER — Other Ambulatory Visit: Payer: Self-pay

## 2019-10-14 DIAGNOSIS — M810 Age-related osteoporosis without current pathological fracture: Secondary | ICD-10-CM | POA: Diagnosis not present

## 2019-10-14 MED ORDER — DENOSUMAB 60 MG/ML ~~LOC~~ SOSY
60.0000 mg | PREFILLED_SYRINGE | Freq: Once | SUBCUTANEOUS | Status: AC
  Administered 2019-10-14: 60 mg via SUBCUTANEOUS

## 2019-10-14 NOTE — Progress Notes (Signed)
Olivia [name] is a 59 y.o. female presents to the office today for prolia injections, per physician's orders. Original order: 12.30.2016 Prolia 60mg  SQ was administered left arm today. Patient tolerated injection. Patient due for follow up labs/provider appt: Yes. Date due: 10.21, appt made No Patient next injection due: 3.11.22, appt made No  Lattie Haw, CMA  Dr Anitra Lauth, please sign off in Dr Lucita Lora absence.

## 2019-10-17 ENCOUNTER — Ambulatory Visit: Payer: BC Managed Care – PPO

## 2019-11-01 NOTE — Progress Notes (Signed)
NIPPLE AREOLAR TATTOO PROCEDURE  PREOPERATIVE DIAGNOSIS:  Acquired absence of (BILATERAL) nipple areolar   POSTOPERATIVE DIAGNOSIS: Acquired absence of (BILATERAL) nipple areolar    PROCEDURES: (BILATERAL) nipple areolar tattoo   ATTENDING SURGEON: Dr. Lyndee Leo Dillingham  ANESTHESIA:  EMLA- applied by pt 30 mins prior to procedure  COMPLICATIONS: None.  JUSTIFICATION FOR PROCEDURE:  Olivia [name] is a 59 y.o. female with a history of breast cancer status post bilateral breast reconstruction. The patient presents for nipple areolar complex tattoo touch-up.  Risks, benefits, indications, and alternatives of the above described procedures were discussed with the patient and all the patient's questions were answered.   DESCRIPTION OF PROCEDURE: After informed consent was obtained and proper identification of patient and surgical site was made, the patient was taken to the procedure room and pre-procedure photos were taken & entered into chart. Pt was placed supine on the operating room table.  A time out was performed to confirm patient's identity and surgical site. The patient was prepped and draped in the usual sterile fashion. Using a # 7 tattoo head, pigment was instilled to the designed nipple areolar complex.  Once adequate pigment had been applied to the nipple areolar complex, post-procedure photos then vaseline & gauze dressing was applied.  The patient tolerated the procedure well. Post-procedure care reviewed with pt. World Famous Tattoo ink used: Tan [place]/Mona Lisa Skin/Cool Mink/Warm Honey/Equinox Duration topical anesthesia as needed. Lot #'s & expiration dates on file.

## 2019-11-01 NOTE — Patient Instructions (Signed)
Pt will use vaseline/gauze dressing for approx 2 weeks Monitor for any complications & call for any concerns F/U in 6 weeks for tattoo touch-up

## 2019-11-02 NOTE — Progress Notes (Signed)
[facility]   Telephone:([phone] Fax:([phone]) [phone]   Clinic Follow up Note   Patient Care Team: Ma Hillock, DO as PCP - General (Family Medicine) Truitt Merle, MD as Consulting Physician (Hematology) Jovita Kussmaul, MD as Consulting Physician (General Surgery) Gery Pray, MD as Consulting Physician (Radiation Oncology) Sable Feil, MD as Consulting Physician (Gastroenterology) Molli Posey, MD as Consulting Physician (Obstetrics and Gynecology) Gardenia Phlegm, NP as Nurse Practitioner (Hematology and Oncology) Elayne Snare, MD as Consulting Physician (Endocrinology)  Date of Service:  11/04/2019  CHIEF COMPLAINT: F/u of right breast cancer  SUMMARY OF ONCOLOGIC HISTORY: Oncology History Overview Note  Cancer Staging Malignant neoplasm of upper-inner quadrant of right breast in female, estrogen receptor positive ([place]) Staging form: Breast, AJCC 8th Edition - Clinical stage from 10/09/2016: Stage IA (cT1c, cN0, cM0, G3, ER: Positive, PR: Positive, HER2: Negative) - Signed by Truitt Merle, MD on 10/14/2016 - Pathologic stage from 11/17/2016: Stage IB (pT2, pN0, cM0, G3, ER: Positive, PR: Positive, HER2: Negative, Oncotype DX score: 21) - Signed by Truitt Merle, MD on 12/11/2016     Malignant neoplasm of upper-inner quadrant of right breast in female, estrogen receptor positive ([place])  10/08/2016 Mammogram   Diagnostic mammogram and ultrasound showed an irregular mass in the right breast at 1 o'clock, 5 cm from the nipple measuring 1.6 x 1.4x 1.6 cm, axilla was negative for adenopathy.   10/09/2016 Receptors her2   Estrogen Receptor: 80%, POSITIVE, STRONG STAINING INTENSITY Progesterone Receptor: 30%, POSITIVE, STRONG STAINING INTENSITY Proliferation Marker Ki67: 35% HER2 NEGATIVE    10/09/2016 Initial Diagnosis   Malignant neoplasm of upper-inner quadrant of right breast in female, estrogen receptor positive ([place])   10/09/2016 Initial Biopsy    Diagnosis Breast, right, needle core biopsy, upper inner quadrant, 1:00 o'clock - INVASIVE DUCTAL CARCINOMA, G3   10/2016 Genetic Testing   Genetic testing was negative for The genes analyzed were the 23 genes on [facility]'s Breast/GYN panel (ATM, BARD1, BRCA1, BRCA2, BRIP1, CDH1, CHEK2, DICER1, EPCAM, MLH1,  MSH2, MSH6, NBN, NF1, PALB2, PMS2, PTEN, RAD50, RAD51C, RAD51D,SMARCA4, STK11, and TP53).     11/17/2016 Surgery   RIGHT MASTECTOMY AND LEFT PROPHYLATIC MASTECTOMY by Dr. Marlou Starks  IMEDITATE BREAST RECONSTRUCTION WITH PLACEMENT OF TISSUE EXPANDER AND FLEX HD (ACELLULAR HYDRATED DERMIS) by Dr. Marla Roe     11/17/2016 Pathology Results   Diagnosis 11/17/16 1. Breast, simple mastectomy, Left - BENIGN BREAST TISSUE WITH ECTATIC DUCTS. - ONE OF ONE LYMPH NODE NEGATIVE FOR CARCINOMA (0/1). 2. Breast, simple mastectomy, Right - INVASIVE DUCTAL CARCINOMA, GRADE 3, SPANNING 2.4 CM. - HIGH GRADE DUCTAL CARCINOMA IN SITU. - INVASIVE CARCINOMA IS <0.1 CM OF THE POSTERIOR MARGIN (BROADLY) AND ANTERIOR MARGIN (FOCALLY). - IN SITU CARCINOMA IS >0.2 CM OF BOTH MARGINS. - PRIOR LUMPECTOMY SITE, NEGATIVE FOR CARCINOMA. - SEE ONCOLOGY TABLE. 3. Breast, excision, Superior flap deep margin - BENIGN FIBROADIPOSE TISSUE.   11/17/2016 Miscellaneous   Oncotype on 11/17/16 Rcurence Risk Score of 21 10-Year risk of distant reucrrence with Tamoxifen alone is 13%    12/18/2016 -  Anti-estrogen oral therapy   Letrozole 2.5 mg daily; began 12/18/16   12/24/2016 Imaging   BONE SCAN IMPRESSION: No evidence of osseous metastatic disease.   12/24/2016 Imaging   CT CAP IMPRESSION: 1. Status post bilateral modified radical mastectomy and breast reconstruction, as above. No findings to suggest metastatic disease in the chest, abdomen or pelvis. 2. Multiple heterogeneous appearing thyroid nodules, as discussed above. These are nonspecific  but further evaluation with nonemergent thyroid ultrasound is  recommended in the near future to determine any potential need for further evaluation with fine-needle aspiration. 3. Incidental findings, as above.   02/11/2017 Surgery   REMOVAL OF BILATERAL TISSUE EXPANDERS WITH PLACEMENT OF BILATERAL SILICONE BREAST IMPLANTS by Dr. Marla Roe       CURRENT THERAPY:  1. Adjuvant Letrozole2.5 mg daily; began11/15/18 2. Prolia injections every 6 months since 2016 through her PCP  INTERVAL HISTORY:  Olivia [name] is here for a follow up of right breast cancer. She presents to the clinic alone. She is doing well. I reviewed her medication list with her. She is on collagen and elderberry supplements. She notes b/l hip, knee and feet pain since January 2021. She notes she had COVID in January 2021 and wonders if this is lingering effects. She notes increased activity can help her pain so she walks and does yoga. She did not have this when she was on Letrozole before.     REVIEW OF SYSTEMS:   Constitutional: Denies fevers, chills or abnormal weight loss Eyes: Denies blurriness of vision Ears, nose, mouth, throat, and face: Denies mucositis or sore throat Respiratory: Denies cough, dyspnea or wheezes Cardiovascular: Denies palpitation, chest discomfort or lower extremity swelling Gastrointestinal:  Denies nausea, heartburn or change in bowel habits Skin: Denies abnormal skin rashes MSK:(+) B/l hip, knee and feet pain  Lymphatics: Denies new lymphadenopathy or easy bruising Neurological:Denies numbness, tingling or new weaknesses Behavioral/Psych: Mood is stable, no new changes  All other systems were reviewed with the patient and are negative.  MEDICAL HISTORY:  Past Medical History:  Diagnosis Date   Adjustment insomnia 02/23/2019   Breast cancer ([place]) 2018   Lumpectomy, chemotherapy and radiation with recurrence 2018. Now on Femara   COVID-19 02/2019   Her husband also passed away at this time from COVID-19.   Family history of breast  cancer    Genetic testing 10/30/2016   Breast/GYN panel (23 genes) @ [facility] - No pathogenic mutations detected   GERD (gastroesophageal reflux disease)    OTC medication   Grief reaction 02/23/2019   Heart murmur    as a child - Echo in early 20s "ok"   History of echocardiogram 05/2015   Echo 4/17: EF 55-60%, GLS -19.6%, normal wall motion   History of exercise stress test 05/2015   ETT 4/17 - No ST changes   HLD (hyperlipidemia)    Multiple thyroid nodules 2009   Multiple nodules: Normal ultrasound reported at diagnosis   Osteoporosis    Personal history of chemotherapy    Personal history of radiation therapy     SURGICAL HISTORY: Past Surgical History:  Procedure Laterality Date   BREAST LUMPECTOMY Right 2003   BREAST RECONSTRUCTION WITH PLACEMENT OF TISSUE EXPANDER AND FLEX HD (ACELLULAR HYDRATED DERMIS) Bilateral 11/17/2016   Procedure: IMEDITATE BREAST RECONSTRUCTION WITH PLACEMENT OF TISSUE EXPANDER AND FLEX HD (ACELLULAR HYDRATED DERMIS);  Surgeon: Wallace Going, DO;  Location: [facility];  Service: Plastics;  Laterality: Bilateral;   BREAST SURGERY  2003 /right   lumpectomy; chemotherapy and radiation   REMOVAL OF BILATERAL TISSUE EXPANDERS WITH PLACEMENT OF BILATERAL BREAST IMPLANTS Bilateral 02/11/2017   Procedure: REMOVAL OF BILATERAL TISSUE EXPANDERS WITH PLACEMENT OF BILATERAL SILICONE BREAST IMPLANTS;  Surgeon: Wallace Going, DO;  Location: [facility];  Service: Plastics;  Laterality: Bilateral;   TOTAL ABDOMINAL HYSTERECTOMY W/ BILATERAL SALPINGOOPHORECTOMY     for treatment of breast treatment  TOTAL MASTECTOMY Bilateral 11/17/2016   Procedure: RIGHT MASTECTOMY AND LEFT PROPHYLATIC MASTECTOMY;  Surgeon: Jovita Kussmaul, MD;  Location: [facility];  Service: General;  Laterality: Bilateral;    I have reviewed the social history and family history with the patient and they are unchanged from previous note.  ALLERGIES:  has No  Known Allergies.  MEDICATIONS:  Current Outpatient Medications  Medication Sig Dispense Refill   ELDERBERRY PO Take by mouth.     atorvastatin (LIPITOR) 20 MG tablet Take 1 tablet (20 mg total) by mouth daily. 90 tablet 1   Calcium 500 MG CHEW Chew by mouth.     cetirizine (ZYRTEC) 10 MG tablet Take 10 mg by mouth as needed.      denosumab (PROLIA) 60 MG/ML SOLN injection Inject 60 mg into the skin every 6 (six) months. Administer in upper arm, thigh, or abdomen 1 Syringe 1   diclofenac (VOLTAREN) 75 MG EC tablet Take 1 tablet (75 mg total) by mouth 2 (two) times daily. 60 tablet 5   exemestane (AROMASIN) 25 MG tablet Take 1 tablet (25 mg total) by mouth daily after breakfast. 30 tablet 3   letrozole (FEMARA) 2.5 MG tablet TAKE 1 TABLET BY MOUTH EVERY DAY 90 tablet 3   psyllium (METAMUCIL) 58.6 % powder Take 1 packet by mouth daily.     Vitamin D, Cholecalciferol, 25 MCG (1000 UT) CAPS Take 1 capsule by mouth daily.     No current facility-administered medications for this visit.    PHYSICAL EXAMINATION: ECOG PERFORMANCE STATUS: 1 - Symptomatic but completely ambulatory  Vitals:   11/04/19 1042  BP: (!) 136/91  Pulse: 63  Resp: 18  Temp: (!) 97.1 F (36.2 C)  SpO2: 100%   Filed Weights   11/04/19 1042  Weight: 148 lb (67.1 kg)    GENERAL:alert, no distress and comfortable SKIN: skin color, texture, turgor are normal, no rashes or significant lesions EYES: normal, Conjunctiva are pink and non-injected, sclera clear  NECK: supple, thyroid normal size, non-tender, without nodularity LYMPH:  no palpable lymphadenopathy in the cervical, axillary  LUNGS: clear to auscultation and percussion with normal breathing effort HEART: regular rate & rhythm and no murmurs and no lower extremity edema ABDOMEN:abdomen soft, non-tender and normal bowel sounds Musculoskeletal:no cyanosis of digits and no clubbing  NEURO: alert & oriented x 3 with fluent speech, no focal  motor/sensory deficits BREAST: S/p b/l mastectomy and reconstruction with implants: Surgical incisions healed well.   LABORATORY DATA:  I have reviewed the data as listed CBC Latest Ref Rng & Units 11/04/2019 05/10/2019 07/29/2018  WBC 4.0 - 10.5 K/uL 5.2 3.6(L) 4.7  Hemoglobin 12.0 - 15.0 g/dL 13.8 14.3 14.4  Hematocrit 36 - 46 % 41.6 42.0 43.7  Platelets 150 - 400 K/uL 259 223.0 257.0     CMP Latest Ref Rng & Units 11/04/2019 05/10/2019 07/29/2018  Glucose 70 - 99 mg/dL 98 88 97  BUN 6 - 20 mg/dL 23(H) 17 20  Creatinine 0.44 - 1.00 mg/dL 0.90 0.87 0.85  Sodium 135 - 145 mmol/L 139 141 142  Potassium 3.5 - 5.1 mmol/L 4.3 4.1 4.8  Chloride 98 - 111 mmol/L 106 105 106  CO2 22 - 32 mmol/L $RemoveB'25 28 27  'sEkfpnid$ Calcium 8.9 - 10.3 mg/dL 9.7 9.5 9.4  Total Protein 6.5 - 8.1 g/dL 7.2 6.8 7.0  Total Bilirubin 0.3 - 1.2 mg/dL 1.0 0.6 0.7  Alkaline Phos 38 - 126 U/L 55 41 49  AST 15 - 41  U/L 33 25 25  ALT 0 - 44 U/L 49(H) 28 33      RADIOGRAPHIC STUDIES: I have personally reviewed the radiological images as listed and agreed with the findings in the report. No results found.   ASSESSMENT & PLAN:  ALIVIYAH MALANGA is a 59 y.o. female with    1. Malignant neoplasm of upper-inner quadrant of right breast, invasive ductal carcinoma, pT2N0M0, Stage 1B, ER/PR: positive, HER2 negative, Grade 3, Oncotype RS 21 -She was diagnosed in 10/2016. She is s/p b/l mastectomy and reconstruction.She did not need chemo based on Oncotype RS.She hadnode-negativemastectomy, therefore will not require adjuvant radiation -Had b/l mastectomy and does not need any mammograms. Will continue regular physical exams. -She started Letrozole on 12/18/16. Plan to continue for 7 years.  -Since 2019/03/14 after having COVID that month she started having b/l hip, knee and feet pain. I discussed AI can contribute to this. I discussed switching to anastrozole, exemestane or Tamoxifen. She is willing to try exemestane.  -From a breast cancer  standpoint, she is clinically doing well. Lab reviewed, her CBC and CMP are within normal limits. Her physical exam unremarkable was unremarkable. There is no clinical concern for recurrence. -Phone call in 3 months and OV in 6 months    2. History of Right Breast Cancer, pT2N1M0, ER+/pr+/her2-, in 2003 -Treated by Dr. Pierce Crane -s/p lumpectomy and axillary lymph node dissection (3/7 positive lymph nodes), chemo, radiation, tamoxifen for 2 years and Anastrozole for 3 years -s/p Total hysterectomy/BSO in 2006   3. Genetictestingwas negative  4. Osteopenia  -She was previously on Fosamax for 5 years. Now shehas beenon Prolia injection for over 2years. She receives injections every 6 monthsthrough her PCP, will continue.  -01/2017 DEXA showed significant improvement from osteoporosis to osteopenia. Lowest density at Right Femur Neck with a T-Score of -2 -Her 07/2019 DEXA showed further improved osteopenia with lowest T-score -1.8 at right femur neck.  -Continue Prolia, calcium, vitamin D and exercise.   5. Thyroid Nodules -Seen on11/21/18 CT Chest. Thyroid is prominent with R side nodularity on 01/22/17 exam. -She waspreviouslyon thyroid replacement in the past for goiter.Continue to f/u withher PCP Dr. Claiborne Billings.  6. H/o COVID19 (+Mar 14, 2019. Treated antibody injection and was able to recover well.  -Her husband who had it before her unfortunately died from COVID19 in 03/14/19.   7. Joint pain  -Onset since 14-Mar-2019 after COVID19 with b/l hips, knees, feet  -I discussed letrozole can contribute to this and discussed switching to another AI or Tamoxifen. She agreed to switch to Exemestane  -I also encouraged her to remain active and suggest Tumeric supplement. She can continue collagen supplement.    PLAN: -Flu shot today  -I called in Exemestane to start in 11/2019 and she will stop letrozole. If copay is high, will change to anastrozole  -Phone visit in 3 months  -Lab  and F/u in 6 months    No problem-specific Assessment & Plan notes found for this encounter.   No orders of the defined types were placed in this encounter.  All questions were answered. The patient knows to call the clinic with any problems, questions or concerns. No barriers to learning was detected. The total time spent in the appointment was 30 minutes.     Malachy Mood, MD 11/04/2019   I, Delphina Cahill, am acting as scribe for Malachy Mood, MD.   I have reviewed the above documentation for accuracy and completeness, and I agree with the  above.

## 2019-11-04 ENCOUNTER — Encounter: Payer: Self-pay | Admitting: Hematology

## 2019-11-04 ENCOUNTER — Other Ambulatory Visit: Payer: Self-pay | Admitting: Hematology

## 2019-11-04 ENCOUNTER — Inpatient Hospital Stay: Payer: BC Managed Care – PPO | Attending: Hematology

## 2019-11-04 ENCOUNTER — Inpatient Hospital Stay (HOSPITAL_BASED_OUTPATIENT_CLINIC_OR_DEPARTMENT_OTHER): Payer: BC Managed Care – PPO | Admitting: Hematology

## 2019-11-04 ENCOUNTER — Other Ambulatory Visit: Payer: Self-pay

## 2019-11-04 VITALS — BP 136/91 | HR 63 | Temp 97.1°F | Resp 18 | Ht 64.0 in | Wt 148.0 lb

## 2019-11-04 DIAGNOSIS — Z9079 Acquired absence of other genital organ(s): Secondary | ICD-10-CM | POA: Diagnosis not present

## 2019-11-04 DIAGNOSIS — Z90722 Acquired absence of ovaries, bilateral: Secondary | ICD-10-CM | POA: Diagnosis not present

## 2019-11-04 DIAGNOSIS — Z170 Estrogen receptor positive status [ER+]: Secondary | ICD-10-CM

## 2019-11-04 DIAGNOSIS — Z9011 Acquired absence of right breast and nipple: Secondary | ICD-10-CM | POA: Diagnosis not present

## 2019-11-04 DIAGNOSIS — Z23 Encounter for immunization: Secondary | ICD-10-CM | POA: Insufficient documentation

## 2019-11-04 DIAGNOSIS — Z9221 Personal history of antineoplastic chemotherapy: Secondary | ICD-10-CM | POA: Diagnosis not present

## 2019-11-04 DIAGNOSIS — E042 Nontoxic multinodular goiter: Secondary | ICD-10-CM | POA: Insufficient documentation

## 2019-11-04 DIAGNOSIS — Z923 Personal history of irradiation: Secondary | ICD-10-CM | POA: Insufficient documentation

## 2019-11-04 DIAGNOSIS — Z79899 Other long term (current) drug therapy: Secondary | ICD-10-CM | POA: Insufficient documentation

## 2019-11-04 DIAGNOSIS — M85851 Other specified disorders of bone density and structure, right thigh: Secondary | ICD-10-CM | POA: Insufficient documentation

## 2019-11-04 DIAGNOSIS — C50211 Malignant neoplasm of upper-inner quadrant of right female breast: Secondary | ICD-10-CM | POA: Diagnosis not present

## 2019-11-04 DIAGNOSIS — Z79811 Long term (current) use of aromatase inhibitors: Secondary | ICD-10-CM | POA: Diagnosis not present

## 2019-11-04 DIAGNOSIS — Z90710 Acquired absence of both cervix and uterus: Secondary | ICD-10-CM | POA: Insufficient documentation

## 2019-11-04 LAB — CMP (CANCER CENTER ONLY)
ALT: 49 U/L — ABNORMAL HIGH (ref 0–44)
AST: 33 U/L (ref 15–41)
Albumin: 4.4 g/dL (ref 3.5–5.0)
Alkaline Phosphatase: 55 U/L (ref 38–126)
Anion gap: 8 (ref 5–15)
BUN: 23 mg/dL — ABNORMAL HIGH (ref 6–20)
CO2: 25 mmol/L (ref 22–32)
Calcium: 9.7 mg/dL (ref 8.9–10.3)
Chloride: 106 mmol/L (ref 98–111)
Creatinine: 0.9 mg/dL (ref 0.44–1.00)
GFR, Est AFR Am: >60 mL/min (ref >60)
GFR, Estimated: >60 mL/min (ref >60)
Glucose, Bld: 98 mg/dL (ref 70–99)
Potassium: 4.3 mmol/L (ref 3.5–5.1)
Sodium: 139 mmol/L (ref 135–145)
Total Bilirubin: 1 mg/dL (ref 0.3–1.2)
Total Protein: 7.2 g/dL (ref 6.5–8.1)

## 2019-11-04 LAB — CBC WITH DIFFERENTIAL/PLATELET
Abs Immature Granulocytes: 0.01 10*3/uL (ref 0.00–0.07)
Basophils Absolute: 0 10*3/uL (ref 0.0–0.1)
Basophils Relative: 1 %
Eosinophils Absolute: 0.1 10*3/uL (ref 0.0–0.5)
Eosinophils Relative: 2 %
HCT: 41.6 % (ref 36.0–46.0)
Hemoglobin: 13.8 g/dL (ref 12.0–15.0)
Immature Granulocytes: 0 %
Lymphocytes Relative: 32 %
Lymphs Abs: 1.7 10*3/uL (ref 0.7–4.0)
MCH: 29 pg (ref 26.0–34.0)
MCHC: 33.2 g/dL (ref 30.0–36.0)
MCV: 87.4 fL (ref 80.0–100.0)
Monocytes Absolute: 0.5 10*3/uL (ref 0.1–1.0)
Monocytes Relative: 10 %
Neutro Abs: 2.9 10*3/uL (ref 1.7–7.7)
Neutrophils Relative %: 55 %
Platelets: 259 10*3/uL (ref 150–400)
RBC: 4.76 MIL/uL (ref 3.87–5.11)
RDW: 13 % (ref 11.5–15.5)
WBC: 5.2 10*3/uL (ref 4.0–10.5)
nRBC: 0 % (ref 0.0–0.2)

## 2019-11-04 MED ORDER — EXEMESTANE 25 MG PO TABS: 25.0000 mg | ORAL_TABLET | Freq: Every day | ORAL | 3 refills | Status: DC

## 2019-11-04 MED ORDER — INFLUENZA VAC SPLIT QUAD 0.5 ML IM SUSY
0.5000 mL | PREFILLED_SYRINGE | Freq: Once | INTRAMUSCULAR | Status: AC
  Administered 2019-11-04: 0.5 mL via INTRAMUSCULAR

## 2019-11-04 MED ORDER — INFLUENZA VAC SPLIT QUAD 0.5 ML IM SUSY
PREFILLED_SYRINGE | INTRAMUSCULAR | Status: AC
  Filled 2019-11-04: qty 0.5

## 2019-11-07 ENCOUNTER — Telehealth: Payer: Self-pay | Admitting: Hematology

## 2019-11-07 NOTE — Telephone Encounter (Signed)
Scheduled per 10/1 los. Pt is aware of appt times and dates.

## 2019-11-15 ENCOUNTER — Other Ambulatory Visit: Payer: BC Managed Care – PPO | Admitting: Plastic Surgery

## 2019-11-22 ENCOUNTER — Other Ambulatory Visit: Payer: Self-pay

## 2019-11-22 ENCOUNTER — Encounter: Payer: Self-pay | Admitting: Plastic Surgery

## 2019-11-22 ENCOUNTER — Ambulatory Visit (INDEPENDENT_AMBULATORY_CARE_PROVIDER_SITE_OTHER): Payer: Self-pay | Admitting: Plastic Surgery

## 2019-11-22 DIAGNOSIS — Z719 Counseling, unspecified: Secondary | ICD-10-CM | POA: Insufficient documentation

## 2019-11-22 NOTE — Progress Notes (Signed)
Preoperative Dx: hyperpigmentation  Postoperative Dx:  same  Procedure: laser to neck  Anesthesia: none  Description of Procedure:  Risks and complications were explained to the patient. Consent was confirmed and signed. Time out was called and all information was confirmed to be correct. The area  area was prepped with alcohol and wiped dry. The IPL laser was set at 7.4 J/cm2. The neck was lasered. The patient tolerated the procedure well and there were no complications. The patient is to follow up in 4 weeks.

## 2019-11-28 ENCOUNTER — Ambulatory Visit (INDEPENDENT_AMBULATORY_CARE_PROVIDER_SITE_OTHER): Payer: BC Managed Care – PPO

## 2019-11-28 ENCOUNTER — Other Ambulatory Visit: Payer: Self-pay

## 2019-11-28 VITALS — BP 128/22 | Ht 64.0 in | Wt 145.0 lb

## 2019-11-28 DIAGNOSIS — Z9013 Acquired absence of bilateral breasts and nipples: Secondary | ICD-10-CM

## 2019-11-29 DIAGNOSIS — L821 Other seborrheic keratosis: Secondary | ICD-10-CM | POA: Diagnosis not present

## 2019-11-29 DIAGNOSIS — L814 Other melanin hyperpigmentation: Secondary | ICD-10-CM | POA: Diagnosis not present

## 2019-11-29 DIAGNOSIS — D225 Melanocytic nevi of trunk: Secondary | ICD-10-CM | POA: Diagnosis not present

## 2019-12-08 ENCOUNTER — Ambulatory Visit: Payer: BC Managed Care – PPO

## 2019-12-13 NOTE — Patient Instructions (Signed)
Post care instructions were reviewed with pt. She will call for any concerns- otherwise she will f/u in 6 weeks for touch-up if needed.

## 2019-12-13 NOTE — Progress Notes (Signed)
NIPPLE AREOLAR TATTOO PROCEDURE  PREOPERATIVE DIAGNOSIS:  Acquired absence of (BILATERAL) nipple areolar   POSTOPERATIVE DIAGNOSIS: Acquired absence of (BILATERAL) nipple areolar    PROCEDURES: (BILATERAL)nipple areolar tattoo touch-up  ATTENDING SURGEON: Dr. Lyndee Leo Dillingham   ANESTHESIA:  EMLA applied 30 mins prior to procedure  COMPLICATIONS: None.  JUSTIFICATION FOR PROCEDURE:  Olivia [name] is a 59 y.o. female with a history of breast cancer status post bilateral breast reconstruction. The patient presents for nipple areolar complex tattoo touch-up  Risks, benefits, indications, and alternatives of the above described procedures were discussed with the patient and all the patient's questions were answered.   DESCRIPTION OF PROCEDURE: After informed consent was obtained and proper identification of patient and surgical site was made, the patient was taken to the procedure room and pre-procedure photo was taken & entered into chart Pt was then placed supine on the operating room table.  A time out was performed to confirm patient's identity and surgical site. The patient was prepped and draped in the usual sterile fashion. Using the # 7 round needle on the Bomtec/Digital Pop machine  pigment was instilled to the designed nipple areolar complex. Once adequate pigment had been applied to the nipple areolar complex, a post-procedure photo was obtained/entered into chart vaseline/ a xeroform dressing & gauze pad was applied.  The patient tolerated the procedure well.   The following [employer]amous Ink was used: Christianne Dolin- lot# [id number] / exp. 01/12/2022 Warm Honey- lot# [id number] / exp. 10/27/2021 Fair Honey- JSC#[id number] / exp. 01/12/2022 Josph Macho Skin- lot# [id number] / exp. 09/18/2022 Duration topical anesthesia used as needed for pain -lot# [id number] UD/ exp. 04/2021

## 2020-01-02 ENCOUNTER — Encounter: Payer: Self-pay | Admitting: Family Medicine

## 2020-01-02 ENCOUNTER — Telehealth (INDEPENDENT_AMBULATORY_CARE_PROVIDER_SITE_OTHER): Payer: BC Managed Care – PPO | Admitting: Family Medicine

## 2020-01-02 DIAGNOSIS — E049 Nontoxic goiter, unspecified: Secondary | ICD-10-CM | POA: Insufficient documentation

## 2020-01-02 DIAGNOSIS — J329 Chronic sinusitis, unspecified: Secondary | ICD-10-CM | POA: Diagnosis not present

## 2020-01-02 DIAGNOSIS — R059 Cough, unspecified: Secondary | ICD-10-CM | POA: Diagnosis not present

## 2020-01-02 DIAGNOSIS — B9689 Other specified bacterial agents as the cause of diseases classified elsewhere: Secondary | ICD-10-CM

## 2020-01-02 DIAGNOSIS — E7800 Pure hypercholesterolemia, unspecified: Secondary | ICD-10-CM | POA: Insufficient documentation

## 2020-01-02 MED ORDER — DOXYCYCLINE HYCLATE 100 MG PO TABS: 100.0000 mg | ORAL_TABLET | Freq: Two times a day (BID) | ORAL | 0 refills | Status: DC

## 2020-01-02 MED ORDER — PREDNISONE 20 MG PO TABS: 40.0000 mg | ORAL_TABLET | Freq: Every day | ORAL | 0 refills | Status: DC

## 2020-01-02 MED ORDER — BENZONATATE 200 MG PO CAPS: 200.0000 mg | ORAL_CAPSULE | Freq: Two times a day (BID) | ORAL | 0 refills | Status: DC

## 2020-01-02 NOTE — Progress Notes (Signed)
VIRTUAL VISIT VIA VIDEO  I connected with Olivia [name] on 01/02/20 at 11:30 AM EST by elemedicine application and verified that I am speaking with the correct person using two identifiers. Location patient: Home Location provider: [facility], Office Persons participating in the virtual visit: Patient, Dr. Raoul Pitch and Samul Dada, CMA  I discussed the limitations of evaluation and management by telemedicine and the availability of in person appointments. The patient expressed understanding and agreed to proceed.   SUBJECTIVE Chief Complaint  Patient presents with  . Cough    pt c/o cough, body ache, and nasal congestion x 3 days    HPI: Olivia [name] is a 59 y.o. female present for cough, body aches and nasal congestion that started approximately 3 days ago on Friday.  Patient has had her Covid series and booster completed.  She also had Covid infection January 2021.  She reports no known sick contacts, however her family did get together on Thursday for Thanksgiving.  She reports the cough is sometimes productive but usually in the morning only and then dry.  He is having dizziness at times and feels like her ears are full.  She is taking Mucinex and cough drops which are helping but do not take away the cough.  She denies any fever, chills, nausea, vomit or loss of sense of taste or smell.  She does feel occasionally winded, but not short of breath.  She states this has been common for her on occasions since she has had Covid in January.  ROS: See pertinent positives and negatives per HPI.  Patient Active Problem List   Diagnosis Date Noted  . Goiter 01/02/2020  . Hypercholesterolemia 01/02/2020  . Encounter for counseling 11/22/2019  . Cervical radiculopathy 07/05/2019  . Status post bilateral breast reconstruction 06/10/2019  . Arthritis 04/22/2018  . Acquired absence of bilateral breasts and nipples 11/24/2016  . Family history of breast cancer   . Malignant neoplasm of  upper-inner quadrant of right breast in female, estrogen receptor positive ([place]) 10/14/2016  . Mixed emotional features as adjustment reaction 11/09/2015  . Encounter for preventive health examination 12/26/2014  . Multiple thyroid nodules 12/26/2014  . Osteoporosis 12/26/2014  . Hypothyroidism 11/17/2006  . UNEQUAL LEG LENGTH, ACQUIRED 11/17/2006    Social History   Tobacco Use  . Smoking status: Never Smoker  . Smokeless tobacco: Never Used  Substance Use Topics  . Alcohol use: Yes    Alcohol/week: 0.0 standard drinks    Comment: Rare    Current Outpatient Medications:  .  atorvastatin (LIPITOR) 20 MG tablet, Take 1 tablet (20 mg total) by mouth daily., Disp: 90 tablet, Rfl: 1 .  Calcium 500 MG CHEW, Chew by mouth., Disp: , Rfl:  .  Collagenase POWD, by Does not apply route., Disp: , Rfl:  .  denosumab (PROLIA) 60 MG/ML SOLN injection, Inject 60 mg into the skin every 6 (six) months. Administer in upper arm, thigh, or abdomen, Disp: 1 Syringe, Rfl: 1 .  diclofenac (VOLTAREN) 75 MG EC tablet, Take 1 tablet (75 mg total) by mouth 2 (two) times daily., Disp: 60 tablet, Rfl: 5 .  ELDERBERRY PO, Take by mouth., Disp: , Rfl:  .  exemestane (AROMASIN) 25 MG tablet, Take 1 tablet (25 mg total) by mouth daily after breakfast., Disp: 30 tablet, Rfl: 3 .  psyllium (METAMUCIL) 58.6 % powder, Take 1 packet by mouth daily., Disp: , Rfl:  .  Vitamin D, Cholecalciferol, 25 MCG (1000 UT)  CAPS, Take 1 capsule by mouth daily., Disp: , Rfl:  .  benzonatate (TESSALON) 200 MG capsule, Take 1 capsule (200 mg total) by mouth 2 (two) times daily., Disp: 20 capsule, Rfl: 0 .  cetirizine (ZYRTEC) 10 MG tablet, Take 10 mg by mouth as needed.  (Patient not taking: Reported on 01/02/2020), Disp: , Rfl:  .  doxycycline (VIBRA-TABS) 100 MG tablet, Take 1 tablet (100 mg total) by mouth 2 (two) times daily., Disp: 20 tablet, Rfl: 0 .  predniSONE (DELTASONE) 20 MG tablet, Take 2 tablets (40 mg total) by mouth daily  with breakfast., Disp: 10 tablet, Rfl: 0  No Known Allergies  OBJECTIVE: There were no vitals taken for this visit. Gen: No acute distress. Nontoxic in appearance.  HENT: AT. [place].  MMM.  Eyes:Pupils Equal Round Reactive to light, Extraocular movements intact,  Conjunctiva without redness, discharge or icterus. Chest: Cough not present on exam.  Hoarseness present on exam.  Appears mildly winded when speaking full sentences. Skin: No rashes, purpura or petechiae.  Neuro:  Normal gait. Alert. Oriented x3  Psych: Normal affect and demeanor. Normal speech. Normal thought content and judgment.  ASSESSMENT AND PLAN: Olivia [name] is a 59 y.o. female present for  Cough/bacterial sinusitis Rest, hydrate.  Start flonase Continue mucinex (DM if cough) Doxycycline prescribed, take until completed.  Start prednisone burst Tessalon Perles prescribed for cough If cough present it can last up to 6-8 weeks.  F/U 2 weeks of not improved.  - Novel Coronavirus, NAA ([facility]); Future> testing will be arranged to be complete.  Patient understands to self isolate until results are available.    Howard Pouch, DO 01/02/2020   Return in about 1 week (around 01/09/2020), or if symptoms worsen or fail to improve.  Orders Placed This Encounter  Procedures  . Novel Coronavirus, NAA ([facility])   Meds ordered this encounter  Medications  . benzonatate (TESSALON) 200 MG capsule    Sig: Take 1 capsule (200 mg total) by mouth 2 (two) times daily.    Dispense:  20 capsule    Refill:  0  . doxycycline (VIBRA-TABS) 100 MG tablet    Sig: Take 1 tablet (100 mg total) by mouth 2 (two) times daily.    Dispense:  20 tablet    Refill:  0  . predniSONE (DELTASONE) 20 MG tablet    Sig: Take 2 tablets (40 mg total) by mouth daily with breakfast.    Dispense:  10 tablet    Refill:  0   Referral Orders  No referral(s) requested today

## 2020-01-02 NOTE — Addendum Note (Signed)
Addended by: Octaviano Glow on: 01/02/2020 03:27 PM   Modules accepted: Orders

## 2020-01-03 ENCOUNTER — Other Ambulatory Visit: Payer: BC Managed Care – PPO | Admitting: Plastic Surgery

## 2020-01-04 LAB — NOVEL CORONAVIRUS, NAA: SARS-CoV-2, NAA: NOT DETECTED

## 2020-01-04 LAB — SARS-COV-2, NAA 2 DAY TAT

## 2020-01-16 ENCOUNTER — Other Ambulatory Visit: Payer: Self-pay

## 2020-01-16 ENCOUNTER — Ambulatory Visit (INDEPENDENT_AMBULATORY_CARE_PROVIDER_SITE_OTHER): Payer: BC Managed Care – PPO

## 2020-01-16 VITALS — BP 128/82 | HR 65 | Temp 97.8°F | Ht 64.0 in | Wt 145.0 lb

## 2020-01-16 DIAGNOSIS — Z9013 Acquired absence of bilateral breasts and nipples: Secondary | ICD-10-CM | POA: Diagnosis not present

## 2020-01-23 NOTE — Progress Notes (Signed)
Patient ID: Olivia [name], female   DOB: 07/12/1960, 59 y.o.   MRN: [id number]          Reason for Appointment: Goiter, follow-up    History of Present Illness:   Patient has been referred by her PCP Dr. Raoul Pitch  The patient's thyroid enlargement was first discovered around the year 1999 At that time she was referred to an endocrinologist also She was also told that she had mild hypothyroidism and was given levothyroxine for a couple of years She does not remember details of any further evaluation no previous ultrasound reports are available No previous biopsy has been done She had an ultrasound done by her PCP in 04/2017 showing multiple nodules and 2 of the nodules were relatively significant in characteristics.  Recent history:  She is here for her annual follow-up Has not noticed any change in her thyroid swelling  Does not report any difficulty with swallowing and no local pressure sensation or choking  Has labs done annually with her PCP  Lab Results  Component Value Date   FREET4 0.80 04/14/2017   FREET4 0.78 12/26/2014   TSH 2.02 05/10/2019   TSH 2.40 04/22/2018   TSH 1.77 04/14/2017    She has had periodic ultrasound exams as follows:  01/2019 ultrasound report:  1. Multinodular goiter  2. Thyroid nodules previously labeled #2 and #6 have been down graded to TR 3 thyroid nodules and an FNA is no longer recommended. 3. There are multiple thyroid nodules as detailed above that require further follow-up with ultrasound. Given the are all TR 3 thyroid nodules, and have been stable for 1 year, a 2 year follow-up ultrasound is recommended.   Previous recommendations: 2. Nodules #1 and #3 have been down graded from TR4 nodules to TR3 nodules, however both still meet imaging criteria to recommend a 1 year follow-up as clinically indicated. 3. Nodules #2, 4 and 6 are unchanged however again all meet imaging criteria to recommend a 1 year follow-up.     Allergies  as of 01/24/2020   No Known Allergies     Medication List       Accurate as of January 24, 2020 11:01 AM. If you have any questions, ask your nurse or doctor.        atorvastatin 20 MG tablet Commonly known as: LIPITOR Take 1 tablet (20 mg total) by mouth daily.   benzonatate 200 MG capsule Commonly known as: TESSALON Take 1 capsule (200 mg total) by mouth 2 (two) times daily.   Calcium 500 MG Chew Chew by mouth.   cetirizine 10 MG tablet Commonly known as: ZYRTEC Take 10 mg by mouth as needed.   Collagenase Powd by Does not apply route.   denosumab 60 MG/ML Soln injection Commonly known as: PROLIA Inject 60 mg into the skin every 6 (six) months. Administer in upper arm, thigh, or abdomen   diclofenac 75 MG EC tablet Commonly known as: VOLTAREN Take 1 tablet (75 mg total) by mouth 2 (two) times daily.   doxycycline 100 MG tablet Commonly known as: VIBRA-TABS Take 1 tablet (100 mg total) by mouth 2 (two) times daily.   ELDERBERRY PO Take by mouth.   exemestane 25 MG tablet Commonly known as: AROMASIN Take 1 tablet (25 mg total) by mouth daily after breakfast.   predniSONE 20 MG tablet Commonly known as: DELTASONE Take 2 tablets (40 mg total) by mouth daily with breakfast.   psyllium 58.6 % powder Commonly known as: METAMUCIL Take  1 packet by mouth daily.   Vitamin D (Cholecalciferol) 25 MCG (1000 UT) Caps Take 1 capsule by mouth daily.       Allergies: No Known Allergies  Past Medical History:  Diagnosis Date  . Adjustment insomnia 02/23/2019  . Breast cancer ([place]) 2018   Lumpectomy, chemotherapy and radiation with recurrence 2018. Now on Femara  . COVID-19 02/2019   Her husband also passed away at this time from COVID-19.  . Family history of breast cancer   . Genetic testing 10/30/2016   Breast/GYN panel (23 genes) @ [facility] - No pathogenic mutations detected  . GERD (gastroesophageal reflux disease)    OTC medication  . Grief reaction  02/23/2019  . Heart murmur    as a child - Echo in early 20s "ok"  . History of echocardiogram 05/2015   Echo 4/17: EF 55-60%, GLS -19.6%, normal wall motion  . History of exercise stress test 05/2015   ETT 4/17 - No ST changes  . HLD (hyperlipidemia)   . Multiple thyroid nodules 2009   Multiple nodules: Normal ultrasound reported at diagnosis  . Osteoporosis   . Personal history of chemotherapy   . Personal history of radiation therapy     Past Surgical History:  Procedure Laterality Date  . BREAST LUMPECTOMY Right 2003  . BREAST RECONSTRUCTION WITH PLACEMENT OF TISSUE EXPANDER AND FLEX HD (ACELLULAR HYDRATED DERMIS) Bilateral 11/17/2016   Procedure: IMEDITATE BREAST RECONSTRUCTION WITH PLACEMENT OF TISSUE EXPANDER AND FLEX HD (ACELLULAR HYDRATED DERMIS);  Surgeon: Wallace Going, DO;  Location: [facility];  Service: Plastics;  Laterality: Bilateral;  . BREAST SURGERY  2003 /right   lumpectomy; chemotherapy and radiation  . REMOVAL OF BILATERAL TISSUE EXPANDERS WITH PLACEMENT OF BILATERAL BREAST IMPLANTS Bilateral 02/11/2017   Procedure: REMOVAL OF BILATERAL TISSUE EXPANDERS WITH PLACEMENT OF BILATERAL SILICONE BREAST IMPLANTS;  Surgeon: Wallace Going, DO;  Location: [facility];  Service: Plastics;  Laterality: Bilateral;  . TOTAL ABDOMINAL HYSTERECTOMY W/ BILATERAL SALPINGOOPHORECTOMY     for treatment of breast treatment  . TOTAL MASTECTOMY Bilateral 11/17/2016   Procedure: RIGHT MASTECTOMY AND LEFT PROPHYLATIC MASTECTOMY;  Surgeon: Jovita Kussmaul, MD;  Location: [facility];  Service: General;  Laterality: Bilateral;    Family History  Problem Relation Age of Onset  . Diabetes Other        1ST DEGREE RELATIVE  . Coronary artery disease Mother        1ST DEGREE RELATIVE  . Heart attack Mother 31  . Skin cancer Mother   . Diabetes Father   . Kidney disease Father   . Prostate cancer Father 12       deceased 72  . Cancer Brother 29       multiple myeloma   .  Lung cancer Maternal Uncle        in 2 mat uncles  . Breast cancer Paternal Aunt 32  . Breast cancer Cousin        dx 52s; daughter of unaffected paternal aunt  . Breast cancer Cousin        dx 68s; another daughter of unaffected paternal aunt  . Esophageal cancer Cousin        pat first cousin    Social History:  reports that she has never smoked. She has never used smokeless tobacco. She reports current alcohol use. She reports that she does not use drugs.    Review of Systems  She has been treated with Prolia for osteoporosis  secondary to breast cancer chemotherapy    Examination:   BP (!) 142/98   Pulse 82   Ht 5' 4" (1.626 m)   Wt 155 lb (70.3 kg)   SpO2 95%   BMI 26.61 kg/m   THYROID exam:  Neck circumference 36.0 cm over the thyroid area Right side of the thyroid is palpable but mostly medially, firm and nodular extending to the [place]  Left lobe is mostly palpable on swallowing, enlarged about 1-1/2 times normal, soft to firm and no nodule palpated  Has no lymphadenopathy.         Repeat blood pressure was about the same  Assessment/Plan:  Multinodular goiter, long-standing with numerous nodules, mostly under 2 cm.   Has been euthyroid on annual thyroid function studies  Thyroid enlargement appears to be about the same as last year and also the lateral part of the right lobe is not as clearly palpable  Since she has had a longstanding stable goiter will continue to follow her annually but needs only clinical exam for now She will have TSH done through her PCP in March at her annual visit  Probable hypertension with family history of high blood pressure: Advised her to make an appointment for follow-up with her PCP within the next month  Elayne Snare 01/24/2020

## 2020-01-24 ENCOUNTER — Ambulatory Visit (INDEPENDENT_AMBULATORY_CARE_PROVIDER_SITE_OTHER): Payer: BC Managed Care – PPO | Admitting: Endocrinology

## 2020-01-24 ENCOUNTER — Other Ambulatory Visit: Payer: Self-pay

## 2020-01-24 VITALS — BP 142/98 | HR 82 | Ht 64.0 in | Wt 155.0 lb

## 2020-01-24 DIAGNOSIS — E042 Nontoxic multinodular goiter: Secondary | ICD-10-CM

## 2020-01-25 ENCOUNTER — Other Ambulatory Visit: Payer: Self-pay | Admitting: Family Medicine

## 2020-01-25 DIAGNOSIS — E782 Mixed hyperlipidemia: Secondary | ICD-10-CM

## 2020-01-26 ENCOUNTER — Other Ambulatory Visit: Payer: Self-pay | Admitting: Hematology

## 2020-02-05 NOTE — Progress Notes (Signed)
ICD-10-CM   1. Status post bilateral breast reconstruction  Z98.890       Patient ID: Olivia [name], female    DOB: 23-Jul-1960, 60 y.o.   MRN: [id number]   History of Present Illness: Olivia [name] is a 60 y.o.  female  with a history of postoperative asymmetry of breasts.  She presents for preoperative evaluation for upcoming procedure, lipofilling of bilateral breasts, scheduled for 02/23/2020 with Dr. Marla Roe.  Summary from previous visit: Patient had bilateral mastectomies followed by reconstruction.  She has 350 cc Mentor smooth round implants.  She had radiation on the right breast.  She has loss of volume in the upper pole of both breasts.  She has undergone nipple tattooing.  She would like lipofilling for improved contour and breast symmetry after reconstruction.  PMH Significant for: Previous right breast cancer with radiation and chemotherapy treatment, GERD, HLD  The patient has not had problems with anesthesia.   Past Medical History: Allergies: No Known Allergies  Current Medications:  Current Outpatient Medications:  .  atorvastatin (LIPITOR) 20 MG tablet, Take 1 tablet (20 mg total) by mouth daily., Disp: 45 tablet, Rfl: 3 .  Calcium 500 MG CHEW, Chew by mouth., Disp: , Rfl:  .  cephALEXin (KEFLEX) 500 MG capsule, Take 1 capsule (500 mg total) by mouth 4 (four) times daily for 3 days. For use AFTER Surgery, Disp: 12 capsule, Rfl: 0 .  cetirizine (ZYRTEC) 10 MG tablet, Take 10 mg by mouth as needed., Disp: , Rfl:  .  Collagenase POWD, by Does not apply route., Disp: , Rfl:  .  denosumab (PROLIA) 60 MG/ML SOLN injection, Inject 60 mg into the skin every 6 (six) months. Administer in upper arm, thigh, or abdomen, Disp: 1 Syringe, Rfl: 1 .  diclofenac (VOLTAREN) 75 MG EC tablet, Take 1 tablet (75 mg total) by mouth 2 (two) times daily., Disp: 60 tablet, Rfl: 5 .  ELDERBERRY PO, Take by mouth., Disp: , Rfl:  .  exemestane (AROMASIN) 25 MG tablet, TAKE 1 TABLET (25 MG  TOTAL) BY MOUTH DAILY AFTER BREAKFAST., Disp: 90 tablet, Rfl: 1 .  HYDROcodone-acetaminophen (NORCO) 5-325 MG tablet, Take 1 tablet by mouth every 8 (eight) hours as needed for up to 5 days for severe pain. For use AFTER Surgery, Disp: 15 tablet, Rfl: 0 .  ondansetron (ZOFRAN-ODT) 4 MG disintegrating tablet, Take 1 tablet (4 mg total) by mouth every 8 (eight) hours as needed for nausea or vomiting., Disp: 20 tablet, Rfl: 0 .  psyllium (METAMUCIL) 58.6 % powder, Take 1 packet by mouth daily., Disp: , Rfl:  .  Vitamin D, Cholecalciferol, 25 MCG (1000 UT) CAPS, Take 1 capsule by mouth daily., Disp: , Rfl:   Past Medical Problems: Past Medical History:  Diagnosis Date  . Adjustment insomnia 02/23/2019  . Breast cancer ([place]) 2018   Lumpectomy, chemotherapy and radiation with recurrence 2018. Now on Femara  . COVID-19 02/2019   Her husband also passed away at this time from COVID-19.  . Family history of breast cancer   . Genetic testing 10/30/2016   Breast/GYN panel (23 genes) @ [facility] - No pathogenic mutations detected  . GERD (gastroesophageal reflux disease)    OTC medication  . Grief reaction 02/23/2019  . Heart murmur    as a child - Echo in early 20s "ok"  . History of echocardiogram 05/2015   Echo 4/17: EF 55-60%, GLS -19.6%, normal wall motion  . History of exercise  stress test 05/2015   ETT 4/17 - No ST changes  . HLD (hyperlipidemia)   . Multiple thyroid nodules 2009   Multiple nodules: Normal ultrasound reported at diagnosis  . Osteoporosis   . Personal history of chemotherapy   . Personal history of radiation therapy     Past Surgical History: Past Surgical History:  Procedure Laterality Date  . BREAST LUMPECTOMY Right 2003  . BREAST RECONSTRUCTION WITH PLACEMENT OF TISSUE EXPANDER AND FLEX HD (ACELLULAR HYDRATED DERMIS) Bilateral 11/17/2016   Procedure: IMEDITATE BREAST RECONSTRUCTION WITH PLACEMENT OF TISSUE EXPANDER AND FLEX HD (ACELLULAR HYDRATED DERMIS);  Surgeon:  Wallace Going, DO;  Location: [facility];  Service: Plastics;  Laterality: Bilateral;  . BREAST SURGERY  2003 /right   lumpectomy; chemotherapy and radiation  . REMOVAL OF BILATERAL TISSUE EXPANDERS WITH PLACEMENT OF BILATERAL BREAST IMPLANTS Bilateral 02/11/2017   Procedure: REMOVAL OF BILATERAL TISSUE EXPANDERS WITH PLACEMENT OF BILATERAL SILICONE BREAST IMPLANTS;  Surgeon: Wallace Going, DO;  Location: [facility];  Service: Plastics;  Laterality: Bilateral;  . TOTAL ABDOMINAL HYSTERECTOMY W/ BILATERAL SALPINGOOPHORECTOMY     for treatment of breast treatment  . TOTAL MASTECTOMY Bilateral 11/17/2016   Procedure: RIGHT MASTECTOMY AND LEFT PROPHYLATIC MASTECTOMY;  Surgeon: Jovita Kussmaul, MD;  Location: [facility];  Service: General;  Laterality: Bilateral;    Social History: Social History   Socioeconomic History  . Marital status: Widowed    Spouse name: Not on file  . Number of children: Not on file  . Years of education: Not on file  . Highest education level: Not on file  Occupational History  . Not on file  Tobacco Use  . Smoking status: Never Smoker  . Smokeless tobacco: Never Used  Vaping Use  . Vaping Use: Never used  Substance and Sexual Activity  . Alcohol use: Yes    Alcohol/week: 0.0 standard drinks    Comment: Rare  . Drug use: No  . Sexual activity: Yes    Birth control/protection: Surgical  Other Topics Concern  . Not on file  Social History Narrative   Married. Bobby.   Children: none. No pets.    Stay at home.    Wears her seatbelt. Smoke alarm in the home.    Feels safe in her relationships.    Exercise 5x week.    Social Determinants of Health   Financial Resource Strain: Not on file  Food Insecurity: Not on file  Transportation Needs: Not on file  Physical Activity: Not on file  Stress: Not on file  Social Connections: Not on file  Intimate Partner Violence: Not on file    Family History: Family History  Problem Relation  Age of Onset  . Diabetes Other        1ST DEGREE RELATIVE  . Coronary artery disease Mother        1ST DEGREE RELATIVE  . Heart attack Mother 23  . Skin cancer Mother   . Diabetes Father   . Kidney disease Father   . Prostate cancer Father 67       deceased 74  . Cancer Brother 47       multiple myeloma   . Lung cancer Maternal Uncle        in 2 mat uncles  . Breast cancer Paternal Aunt 62  . Breast cancer Cousin        dx 68s; daughter of unaffected paternal aunt  . Breast cancer Cousin  dx 52s; another daughter of unaffected paternal aunt  . Esophageal cancer Cousin        pat first cousin    Review of Systems: Review of Systems  Constitutional: Negative for chills and fever.  HENT: Negative for congestion and sore throat.   Respiratory: Negative for cough and shortness of breath.   Cardiovascular: Negative for chest pain.  Gastrointestinal: Negative for abdominal pain, nausea and vomiting.  Musculoskeletal: Positive for joint pain (arthritis).  Skin: Negative for itching and rash.    Physical Exam: Vital Signs BP (!) 141/97 (BP Location: Left Arm, Patient Position: Sitting, Cuff Size: Normal)   Pulse 97   Temp 97.6 F (36.4 C) (Oral)   Ht _0  (1.626 m)   Wt 156 lb 12.8 oz (71.1 kg)   SpO2 99%   BMI 26.91 kg/m  Physical Exam Vitals and nursing note reviewed.  Constitutional:      General: She is not in acute distress.    Appearance: Normal appearance. She is normal weight. She is not ill-appearing.  HENT:     Head: Normocephalic and atraumatic.  Eyes:     Extraocular Movements: Extraocular movements intact.  Cardiovascular:     Rate and Rhythm: Normal rate and regular rhythm.     Pulses: Normal pulses.     Heart sounds: Normal heart sounds.  Pulmonary:     Effort: Pulmonary effort is normal.     Breath sounds: Normal breath sounds. No wheezing, rhonchi or rales.  Abdominal:     General: Bowel sounds are normal.     Palpations: Abdomen is soft.   Musculoskeletal:        General: No swelling. Normal range of motion.     Cervical back: Normal range of motion.  Skin:    General: Skin is warm and dry.     Coloration: Skin is not pale.     Findings: No erythema or rash.  Neurological:     General: No focal deficit present.     Mental Status: She is alert and oriented to person, place, and time.  Psychiatric:        Mood and Affect: Mood normal.        Behavior: Behavior normal.        Thought Content: Thought content normal.        Judgment: Judgment normal.     Assessment/Plan:  Ms. Teem scheduled for lipofilling of bilateral breasts with Dr. Marla Roe.  Risks, benefits, and alternatives of procedure discussed, questions answered and consent obtained.    Smoking Status: non-smoker; Counseling Given? N/A Last Mammogram: N/A-bilateral mastectomies  Caprini Score: High; Risk Factors include: 60 year old female, Hx cancer, BMI > 25, and length of planned surgery. Recommendation for mechanical and pharmacological prophylaxis during surgery. Encourage early ambulation.   Pictures obtained: 06/10/2019  Post-op Rx sent to pharmacy: Norco, Keflex, Zofran  Patient was provided with the General Surgical Risk consent document and Pain Medication Agreement prior to their appointment.  They had adequate time to read through the risk consent documents and Pain Medication Agreement. We also discussed them in person together during this preop appointment. All of their questions were answered to their satisfaction.  Recommended calling if they have any further questions.  Risk consent form and Pain Medication Agreement to be scanned into patient's chart.  The risks that can be encountered with and after liposuction were discussed and include the following but no limited to these:  Asymmetry, fluid accumulation, firmness of the area, fat  necrosis with death of fat tissue, bleeding, infection, delayed healing, anesthesia risks, skin sensation  changes, injury to structures including nerves, blood vessels, and muscles which may be temporary or permanent, allergies to tape, suture materials and glues, blood products, topical preparations or injected agents, skin and contour irregularities, skin discoloration and swelling, deep vein thrombosis, cardiac and pulmonary complications, pain, which may persist, persistent pain, recurrence of the lesion, poor healing of the incision, possible need for revisional surgery or staged procedures. Thiere can also be persistent swelling, poor wound healing, rippling or loose skin, worsening of cellulite, swelling, and thermal burn or heat injury from ultrasound with the ultrasound-assisted lipoplasty technique. Any change in weight fluctuations can alter the outcome.  Electronically signed by: Threasa Heads, PA-C 02/07/2020 2:06 PM

## 2020-02-05 NOTE — H&P (View-Only) (Signed)
   ICD-10-CM   1. Status post bilateral breast reconstruction  Z98.890       Patient ID: Olivia [name], female    DOB: 10/21/1960, 60 y.o.   MRN: [id number]   History of Present Illness: Olivia [name] is a 60 y.o.  female  with a history of postoperative asymmetry of breasts.  She presents for preoperative evaluation for upcoming procedure, lipofilling of bilateral breasts, scheduled for 02/23/2020 with Dr. Dillingham.  Summary from previous visit: Patient had bilateral mastectomies followed by reconstruction.  She has 350 cc Mentor smooth round implants.  She had radiation on the right breast.  She has loss of volume in the upper pole of both breasts.  She has undergone nipple tattooing.  She would like lipofilling for improved contour and breast symmetry after reconstruction.  PMH Significant for: Previous right breast cancer with radiation and chemotherapy treatment, GERD, HLD  The patient has not had problems with anesthesia.   Past Medical History: Allergies: No Known Allergies  Current Medications:  Current Outpatient Medications:  .  atorvastatin (LIPITOR) 20 MG tablet, Take 1 tablet (20 mg total) by mouth daily., Disp: 45 tablet, Rfl: 3 .  Calcium 500 MG CHEW, Chew by mouth., Disp: , Rfl:  .  cephALEXin (KEFLEX) 500 MG capsule, Take 1 capsule (500 mg total) by mouth 4 (four) times daily for 3 days. For use AFTER Surgery, Disp: 12 capsule, Rfl: 0 .  cetirizine (ZYRTEC) 10 MG tablet, Take 10 mg by mouth as needed., Disp: , Rfl:  .  Collagenase POWD, by Does not apply route., Disp: , Rfl:  .  denosumab (PROLIA) 60 MG/ML SOLN injection, Inject 60 mg into the skin every 6 (six) months. Administer in upper arm, thigh, or abdomen, Disp: 1 Syringe, Rfl: 1 .  diclofenac (VOLTAREN) 75 MG EC tablet, Take 1 tablet (75 mg total) by mouth 2 (two) times daily., Disp: 60 tablet, Rfl: 5 .  ELDERBERRY PO, Take by mouth., Disp: , Rfl:  .  exemestane (AROMASIN) 25 MG tablet, TAKE 1 TABLET (25 MG  TOTAL) BY MOUTH DAILY AFTER BREAKFAST., Disp: 90 tablet, Rfl: 1 .  HYDROcodone-acetaminophen (NORCO) 5-325 MG tablet, Take 1 tablet by mouth every 8 (eight) hours as needed for up to 5 days for severe pain. For use AFTER Surgery, Disp: 15 tablet, Rfl: 0 .  ondansetron (ZOFRAN-ODT) 4 MG disintegrating tablet, Take 1 tablet (4 mg total) by mouth every 8 (eight) hours as needed for nausea or vomiting., Disp: 20 tablet, Rfl: 0 .  psyllium (METAMUCIL) 58.6 % powder, Take 1 packet by mouth daily., Disp: , Rfl:  .  Vitamin D, Cholecalciferol, 25 MCG (1000 UT) CAPS, Take 1 capsule by mouth daily., Disp: , Rfl:   Past Medical Problems: Past Medical History:  Diagnosis Date  . Adjustment insomnia 02/23/2019  . Breast cancer ([facility]) 2018   Lumpectomy, chemotherapy and radiation with recurrence 2018. Now on Femara  . COVID-19 02/2019   Her husband also passed away at this time from COVID-19.  . Family history of breast cancer   . Genetic testing 10/30/2016   Breast/GYN panel (23 genes) @ [facility] - No pathogenic mutations detected  . GERD (gastroesophageal reflux disease)    OTC medication  . Grief reaction 02/23/2019  . Heart murmur    as a child - Echo in early 20s "ok"  . History of echocardiogram 05/2015   Echo 4/17: EF 55-60%, GLS -19.6%, normal wall motion  . History of exercise   stress test 05/2015   ETT 4/17 - No ST changes  . HLD (hyperlipidemia)   . Multiple thyroid nodules 2009   Multiple nodules: Normal ultrasound reported at diagnosis  . Osteoporosis   . Personal history of chemotherapy   . Personal history of radiation therapy     Past Surgical History: Past Surgical History:  Procedure Laterality Date  . BREAST LUMPECTOMY Right 2003  . BREAST RECONSTRUCTION WITH PLACEMENT OF TISSUE EXPANDER AND FLEX HD (ACELLULAR HYDRATED DERMIS) Bilateral 11/17/2016   Procedure: IMEDITATE BREAST RECONSTRUCTION WITH PLACEMENT OF TISSUE EXPANDER AND FLEX HD (ACELLULAR HYDRATED DERMIS);  Surgeon:  Dillingham, Claire S, DO;  Location: [facility];  Service: Plastics;  Laterality: Bilateral;  . BREAST SURGERY  2003 /right   lumpectomy; chemotherapy and radiation  . REMOVAL OF BILATERAL TISSUE EXPANDERS WITH PLACEMENT OF BILATERAL BREAST IMPLANTS Bilateral 02/11/2017   Procedure: REMOVAL OF BILATERAL TISSUE EXPANDERS WITH PLACEMENT OF BILATERAL SILICONE BREAST IMPLANTS;  Surgeon: Dillingham, Claire S, DO;  Location: [facility];  Service: Plastics;  Laterality: Bilateral;  . TOTAL ABDOMINAL HYSTERECTOMY W/ BILATERAL SALPINGOOPHORECTOMY     for treatment of breast treatment  . TOTAL MASTECTOMY Bilateral 11/17/2016   Procedure: RIGHT MASTECTOMY AND LEFT PROPHYLATIC MASTECTOMY;  Surgeon: Toth, Paul III, MD;  Location: [facility];  Service: General;  Laterality: Bilateral;    Social History: Social History   Socioeconomic History  . Marital status: Widowed    Spouse name: Not on file  . Number of children: Not on file  . Years of education: Not on file  . Highest education level: Not on file  Occupational History  . Not on file  Tobacco Use  . Smoking status: Never Smoker  . Smokeless tobacco: Never Used  Vaping Use  . Vaping Use: Never used  Substance and Sexual Activity  . Alcohol use: Yes    Alcohol/week: 0.0 standard drinks    Comment: Rare  . Drug use: No  . Sexual activity: Yes    Birth control/protection: Surgical  Other Topics Concern  . Not on file  Social History Narrative   Married. Bobby.   Children: none. No pets.    Stay at home.    Wears her seatbelt. Smoke alarm in the home.    Feels safe in her relationships.    Exercise 5x week.    Social Determinants of Health   Financial Resource Strain: Not on file  Food Insecurity: Not on file  Transportation Needs: Not on file  Physical Activity: Not on file  Stress: Not on file  Social Connections: Not on file  Intimate Partner Violence: Not on file    Family History: Family History  Problem Relation  Age of Onset  . Diabetes Other        1ST DEGREE RELATIVE  . Coronary artery disease Mother        1ST DEGREE RELATIVE  . Heart attack Mother 45  . Skin cancer Mother   . Diabetes Father   . Kidney disease Father   . Prostate cancer Father 70       deceased 84  . Cancer Brother 56       multiple myeloma   . Lung cancer Maternal Uncle        in 2 mat uncles  . Breast cancer Paternal Aunt 75  . Breast cancer Cousin        dx 70s; daughter of unaffected paternal aunt  . Breast cancer Cousin          dx 70s; another daughter of unaffected paternal aunt  . Esophageal cancer Cousin        pat first cousin    Review of Systems: Review of Systems  Constitutional: Negative for chills and fever.  HENT: Negative for congestion and sore throat.   Respiratory: Negative for cough and shortness of breath.   Cardiovascular: Negative for chest pain.  Gastrointestinal: Negative for abdominal pain, nausea and vomiting.  Musculoskeletal: Positive for joint pain (arthritis).  Skin: Negative for itching and rash.    Physical Exam: Vital Signs BP (!) 141/97 (BP Location: Left Arm, Patient Position: Sitting, Cuff Size: Normal)   Pulse 97   Temp 97.6 F (36.4 C) (Oral)   Ht 5' 4" (1.626 m)   Wt 156 lb 12.8 oz (71.1 kg)   SpO2 99%   BMI 26.91 kg/m  Physical Exam Vitals and nursing note reviewed.  Constitutional:      General: She is not in acute distress.    Appearance: Normal appearance. She is normal weight. She is not ill-appearing.  HENT:     Head: Normocephalic and atraumatic.  Eyes:     Extraocular Movements: Extraocular movements intact.  Cardiovascular:     Rate and Rhythm: Normal rate and regular rhythm.     Pulses: Normal pulses.     Heart sounds: Normal heart sounds.  Pulmonary:     Effort: Pulmonary effort is normal.     Breath sounds: Normal breath sounds. No wheezing, rhonchi or rales.  Abdominal:     General: Bowel sounds are normal.     Palpations: Abdomen is soft.   Musculoskeletal:        General: No swelling. Normal range of motion.     Cervical back: Normal range of motion.  Skin:    General: Skin is warm and dry.     Coloration: Skin is not pale.     Findings: No erythema or rash.  Neurological:     General: No focal deficit present.     Mental Status: She is alert and oriented to person, place, and time.  Psychiatric:        Mood and Affect: Mood normal.        Behavior: Behavior normal.        Thought Content: Thought content normal.        Judgment: Judgment normal.     Assessment/Plan:  Ms. Foss scheduled for lipofilling of bilateral breasts with Dr. Dillingham.  Risks, benefits, and alternatives of procedure discussed, questions answered and consent obtained.    Smoking Status: non-smoker; Counseling Given? N/A Last Mammogram: N/A-bilateral mastectomies  Caprini Score: High; Risk Factors include: 59-year-old female, Hx cancer, BMI > 25, and length of planned surgery. Recommendation for mechanical and pharmacological prophylaxis during surgery. Encourage early ambulation.   Pictures obtained: 06/10/2019  Post-op Rx sent to pharmacy: Norco, Keflex, Zofran  Patient was provided with the General Surgical Risk consent document and Pain Medication Agreement prior to their appointment.  They had adequate time to read through the risk consent documents and Pain Medication Agreement. We also discussed them in person together during this preop appointment. All of their questions were answered to their satisfaction.  Recommended calling if they have any further questions.  Risk consent form and Pain Medication Agreement to be scanned into patient's chart.  The risks that can be encountered with and after liposuction were discussed and include the following but no limited to these:  Asymmetry, fluid accumulation, firmness of the area, fat   necrosis with death of fat tissue, bleeding, infection, delayed healing, anesthesia risks, skin sensation  changes, injury to structures including nerves, blood vessels, and muscles which may be temporary or permanent, allergies to tape, suture materials and glues, blood products, topical preparations or injected agents, skin and contour irregularities, skin discoloration and swelling, deep vein thrombosis, cardiac and pulmonary complications, pain, which may persist, persistent pain, recurrence of the lesion, poor healing of the incision, possible need for revisional surgery or staged procedures. Thiere can also be persistent swelling, poor wound healing, rippling or loose skin, worsening of cellulite, swelling, and thermal burn or heat injury from ultrasound with the ultrasound-assisted lipoplasty technique. Any change in weight fluctuations can alter the outcome.  Electronically signed by: Hektor Huston C Ryo Klang, PA-C 02/07/2020 2:06 PM          

## 2020-02-06 ENCOUNTER — Other Ambulatory Visit: Payer: Self-pay

## 2020-02-06 ENCOUNTER — Ambulatory Visit (INDEPENDENT_AMBULATORY_CARE_PROVIDER_SITE_OTHER): Payer: BC Managed Care – PPO | Admitting: Family Medicine

## 2020-02-06 ENCOUNTER — Encounter: Payer: Self-pay | Admitting: Family Medicine

## 2020-02-06 VITALS — BP 125/79 | HR 75 | Temp 98.3°F | Resp 12 | Ht 60.0 in | Wt 157.0 lb

## 2020-02-06 DIAGNOSIS — I10 Essential (primary) hypertension: Secondary | ICD-10-CM

## 2020-02-06 DIAGNOSIS — M810 Age-related osteoporosis without current pathological fracture: Secondary | ICD-10-CM | POA: Diagnosis not present

## 2020-02-06 DIAGNOSIS — M1990 Unspecified osteoarthritis, unspecified site: Secondary | ICD-10-CM

## 2020-02-06 DIAGNOSIS — E782 Mixed hyperlipidemia: Secondary | ICD-10-CM | POA: Diagnosis not present

## 2020-02-06 DIAGNOSIS — E7800 Pure hypercholesterolemia, unspecified: Secondary | ICD-10-CM

## 2020-02-06 MED ORDER — ATORVASTATIN CALCIUM 20 MG PO TABS: 20.0000 mg | ORAL_TABLET | Freq: Every day | ORAL | 3 refills | Status: DC

## 2020-02-06 MED ORDER — DICLOFENAC SODIUM 75 MG PO TBEC: 75.0000 mg | DELAYED_RELEASE_TABLET | Freq: Two times a day (BID) | ORAL | 5 refills | Status: DC

## 2020-02-06 NOTE — Patient Instructions (Signed)
Physical after April 11th. We will collect all your labs that day also   Your BP is good today.  Low sodium. plenty of water.  Exercise.

## 2020-02-06 NOTE — Progress Notes (Signed)
This visit occurred during the SARS-CoV-2 public health emergency.  Safety protocols were in place, including screening questions prior to the visit, additional usage of staff PPE, and extensive cleaning of exam room while observing appropriate contact time as indicated for disinfecting solutions.    Patient ID: Olivia [name], female  DOB: 1960/02/06, 60 y.o.   MRN: [id number] Patient Care Team    Relationship Specialty Notifications Start End  Ma Hillock, DO PCP - General Family Medicine  12/26/14   Truitt Merle, MD Consulting Physician Hematology  10/15/16   Jovita Kussmaul, MD Consulting Physician General Surgery  10/15/16   Gery Pray, MD Consulting Physician Radiation Oncology  10/15/16   Sable Feil, MD Consulting Physician Gastroenterology  04/14/17   Molli Posey, MD Consulting Physician Obstetrics and Gynecology  04/14/17   Gardenia Phlegm, NP Nurse Practitioner Hematology and Oncology  07/08/17   Elayne Snare, MD Consulting Physician Endocrinology  04/22/18    Comment: thyroid nodules    Chief Complaint  Patient presents with  . blood pressure follow up    Subjective:  Olivia [name] is a 60 y.o.  Female  present for BP follow [name] Elevated BP/HLD: Pt was seen at her endocrinologist mid december with elevated BP reading 142/98. BP had been elevated on two other additional reading in October. She is compliant with statin. Labs UTD 05/2019. Patient denies chest pain, shortness of breath, dizziness or lower extremity edema.  She does not consume a salty diet.   Arthritis:  She reports diclofenac1-2 x daily and it  is working well. Mobic had not been working well for her.     Depression screen [facility] 2/9 05/10/2019 04/22/2018 04/14/2017 01/29/2012  Decreased Interest 0 0 0 0  Down, Depressed, Hopeless 0 0 0 0  PHQ - 2 Score 0 0 0 0  Altered sleeping - 0 - -  Tired, decreased energy - 0 - -  Change in appetite - 0 - -  Feeling bad or failure about yourself  -  0 - -  Trouble concentrating - 0 - -  Moving slowly or fidgety/restless - 0 - -  Suicidal thoughts - 0 - -  PHQ-9 Score - 0 - -  Difficult doing work/chores - Not difficult at all - -   GAD 7 : Generalized Anxiety Score 04/22/2018  Nervous, Anxious, on Edge 1  Control/stop worrying 1  Worry too much - different things 1  Trouble relaxing 0  Restless 0  Easily annoyed or irritable 1  Afraid - awful might happen 0  Total GAD 7 Score 4  Anxiety Difficulty Not difficult at all    Immunization History  Administered Date(s) Administered  . Influenza Split 11/26/2011  . Influenza,inj,Quad PF,6+ Mos 11/20/2013, 10/20/2018, 11/04/2019  . Influenza,inj,quad, With Preservative 11/18/2018  . Influenza-Unspecified 12/05/2014, 10/03/2015, 11/10/2016, 10/04/2017  . PFIZER SARS-COV-2 Vaccination 05/12/2019, 06/06/2019, 12/07/2019  . Tdap 01/29/2012  . Zoster Recombinat (Shingrix) 06/03/2017, 08/18/2017    Past Medical History:  Diagnosis Date  . Adjustment insomnia 02/23/2019  . Breast cancer ([place]) 2018   Lumpectomy, chemotherapy and radiation with recurrence 2018. Now on Femara  . COVID-19 02/2019   Her husband also passed away at this time from COVID-19.  . Family history of breast cancer   . Genetic testing 10/30/2016   Breast/GYN panel (23 genes) @ [facility] - No pathogenic mutations detected  . GERD (gastroesophageal reflux disease)    OTC medication  . Grief reaction 02/23/2019  .  Heart murmur    as a child - Echo in early 20s "ok"  . History of echocardiogram 05/2015   Echo 4/17: EF 55-60%, GLS -19.6%, normal wall motion  . History of exercise stress test 05/2015   ETT 4/17 - No ST changes  . HLD (hyperlipidemia)   . Multiple thyroid nodules 2009   Multiple nodules: Normal ultrasound reported at diagnosis  . Osteoporosis   . Personal history of chemotherapy   . Personal history of radiation therapy    No Known Allergies Past Surgical History:  Procedure Laterality Date  .  BREAST LUMPECTOMY Right 2003  . BREAST RECONSTRUCTION WITH PLACEMENT OF TISSUE EXPANDER AND FLEX HD (ACELLULAR HYDRATED DERMIS) Bilateral 11/17/2016   Procedure: IMEDITATE BREAST RECONSTRUCTION WITH PLACEMENT OF TISSUE EXPANDER AND FLEX HD (ACELLULAR HYDRATED DERMIS);  Surgeon: Wallace Going, DO;  Location: [place];  Service: Plastics;  Laterality: Bilateral;  . BREAST SURGERY  2003 /right   lumpectomy; chemotherapy and radiation  . REMOVAL OF BILATERAL TISSUE EXPANDERS WITH PLACEMENT OF BILATERAL BREAST IMPLANTS Bilateral 02/11/2017   Procedure: REMOVAL OF BILATERAL TISSUE EXPANDERS WITH PLACEMENT OF BILATERAL SILICONE BREAST IMPLANTS;  Surgeon: Wallace Going, DO;  Location: [place];  Service: Plastics;  Laterality: Bilateral;  . TOTAL ABDOMINAL HYSTERECTOMY W/ BILATERAL SALPINGOOPHORECTOMY     for treatment of breast treatment  . TOTAL MASTECTOMY Bilateral 11/17/2016   Procedure: RIGHT MASTECTOMY AND LEFT PROPHYLATIC MASTECTOMY;  Surgeon: Jovita Kussmaul, MD;  Location: [facility] OR;  Service: General;  Laterality: Bilateral;   Family History  Problem Relation Age of Onset  . Diabetes Other        1ST DEGREE RELATIVE  . Coronary artery disease Mother        1ST DEGREE RELATIVE  . Heart attack Mother 25  . Skin cancer Mother   . Diabetes Father   . Kidney disease Father   . Prostate cancer Father 67       deceased 3  . Cancer Brother 43       multiple myeloma   . Lung cancer Maternal Uncle        in 2 mat uncles  . Breast cancer Paternal Aunt 52  . Breast cancer Cousin        dx 57s; daughter of unaffected paternal aunt  . Breast cancer Cousin        dx 17s; another daughter of unaffected paternal aunt  . Esophageal cancer Cousin        pat first cousin   Social History   Social History Narrative   Married. Bobby.   Children: none. No pets.    Stay at home.    Wears her seatbelt. Smoke alarm in the home.    Feels safe in her relationships.    Exercise  5x week.     Allergies as of 02/06/2020   No Known Allergies     Medication List       Accurate as of February 06, 2020 10:46 AM. If you have any questions, ask your nurse or doctor.        STOP taking these medications   benzonatate 200 MG capsule Commonly known as: TESSALON Stopped by: Howard Pouch, DO   doxycycline 100 MG tablet Commonly known as: VIBRA-TABS Stopped by: Howard Pouch, DO   predniSONE 20 MG tablet Commonly known as: DELTASONE Stopped by: Howard Pouch, DO     TAKE these medications   atorvastatin 20 MG tablet Commonly known as: LIPITOR  Take 1 tablet (20 mg total) by mouth daily.   Calcium 500 MG Chew Chew by mouth.   cetirizine 10 MG tablet Commonly known as: ZYRTEC Take 10 mg by mouth as needed.   Collagenase Powd by Does not apply route.   denosumab 60 MG/ML Soln injection Commonly known as: PROLIA Inject 60 mg into the skin every 6 (six) months. Administer in upper arm, thigh, or abdomen   diclofenac 75 MG EC tablet Commonly known as: VOLTAREN Take 1 tablet (75 mg total) by mouth 2 (two) times daily.   ELDERBERRY PO Take by mouth.   exemestane 25 MG tablet Commonly known as: AROMASIN TAKE 1 TABLET (25 MG TOTAL) BY MOUTH DAILY AFTER BREAKFAST.   psyllium 58.6 % powder Commonly known as: METAMUCIL Take 1 packet by mouth daily.   Vitamin D (Cholecalciferol) 25 MCG (1000 UT) Caps Take 1 capsule by mouth daily.       All past medical history, surgical history, allergies, family history, immunizations andmedications were updated in the EMR today and reviewed under the history and medication portions of their EMR.      No results found.   ROS: 14 pt review of systems performed and negative (unless mentioned in an HPI)  Objective: BP 125/79 (BP Location: Left Arm, Cuff Size: Normal)   Pulse 75   Temp 98.3 F (36.8 C) (Oral)   Resp 12   Ht 5' (1.524 m)   Wt 157 lb (71.2 kg)   SpO2 96%   BMI 30.66 kg/m  Gen: Afebrile. No  acute distress. Nontoxic, pleasant female.  HENT: AT. [place].  Eyes:Pupils Equal Round Reactive to light, Extraocular movements intact,  Conjunctiva without redness, discharge or icterus. CV: RRR no murmur, no edema, +2/4 P posterior tibialis pulses Chest: CTAB, no wheeze or crackles Neuro:  Normal gait. PERLA. EOMi. Alert. Orientedx3 Psych: Normal affect, dress and demeanor. Normal speech. Normal thought content and judgment..   No exam data present  Assessment/plan: Olivia [name] is a 60 y.o. female present for  Elevated blood pressure reading in office with diagnosis of hypertension BP is normal here today.  Pt encouraged to continue to monitor BP if routinely above 135/85> she understands to make an appt to be seen.  Low sodium diet encouraged exercise encouraged  Multiple thyroid nodules Rpt [place] due 01/2022. Has been following w/ Dr. Dwyane Dee   Osteoporosis, unspecified osteoporosis type, unspecified pathological fracture presence Continue prolia q 6 months.  Continue vit d supplement  Malignant neoplasm of upper-inner quadrant of right breast in female, estrogen receptor positive ([place]) Routinely follows with heme-onc and GYN- prescribed femara  Mixed hyperlipidemia/statin therapy Continue Lipitor 20 Labs UTD due 05/2019  Arthritis Stable.  Continue  Voltaren BID F/u April with CPE   Return in about 14 weeks (around 05/14/2020) for CPE (30 min).   No orders of the defined types were placed in this encounter.  Meds ordered this encounter  Medications  . diclofenac (VOLTAREN) 75 MG EC tablet    Sig: Take 1 tablet (75 mg total) by mouth 2 (two) times daily.    Dispense:  60 tablet    Refill:  5  . atorvastatin (LIPITOR) 20 MG tablet    Sig: Take 1 tablet (20 mg total) by mouth daily.    Dispense:  45 tablet    Refill:  3   Referral Orders  No referral(s) requested today     Electronically signed by: Howard Pouch, [place]

## 2020-02-07 ENCOUNTER — Other Ambulatory Visit: Payer: Self-pay

## 2020-02-07 ENCOUNTER — Encounter: Payer: Self-pay | Admitting: Plastic Surgery

## 2020-02-07 ENCOUNTER — Encounter: Payer: Self-pay | Admitting: Nurse Practitioner

## 2020-02-07 ENCOUNTER — Inpatient Hospital Stay: Payer: BC Managed Care – PPO | Attending: Hematology | Admitting: Nurse Practitioner

## 2020-02-07 ENCOUNTER — Ambulatory Visit (INDEPENDENT_AMBULATORY_CARE_PROVIDER_SITE_OTHER): Payer: BC Managed Care – PPO | Admitting: Plastic Surgery

## 2020-02-07 VITALS — BP 141/97 | HR 97 | Temp 97.6°F | Ht 64.0 in | Wt 156.8 lb

## 2020-02-07 DIAGNOSIS — E042 Nontoxic multinodular goiter: Secondary | ICD-10-CM | POA: Insufficient documentation

## 2020-02-07 DIAGNOSIS — Z90722 Acquired absence of ovaries, bilateral: Secondary | ICD-10-CM | POA: Insufficient documentation

## 2020-02-07 DIAGNOSIS — M8580 Other specified disorders of bone density and structure, unspecified site: Secondary | ICD-10-CM | POA: Insufficient documentation

## 2020-02-07 DIAGNOSIS — Z9889 Other specified postprocedural states: Secondary | ICD-10-CM

## 2020-02-07 DIAGNOSIS — C50211 Malignant neoplasm of upper-inner quadrant of right female breast: Secondary | ICD-10-CM | POA: Insufficient documentation

## 2020-02-07 DIAGNOSIS — Z79899 Other long term (current) drug therapy: Secondary | ICD-10-CM | POA: Insufficient documentation

## 2020-02-07 DIAGNOSIS — Z170 Estrogen receptor positive status [ER+]: Secondary | ICD-10-CM | POA: Insufficient documentation

## 2020-02-07 DIAGNOSIS — Z923 Personal history of irradiation: Secondary | ICD-10-CM | POA: Insufficient documentation

## 2020-02-07 DIAGNOSIS — Z79811 Long term (current) use of aromatase inhibitors: Secondary | ICD-10-CM | POA: Insufficient documentation

## 2020-02-07 DIAGNOSIS — Z9013 Acquired absence of bilateral breasts and nipples: Secondary | ICD-10-CM | POA: Insufficient documentation

## 2020-02-07 MED ORDER — HYDROCODONE-ACETAMINOPHEN 5-325 MG PO TABS: 1.0000 | ORAL_TABLET | Freq: Three times a day (TID) | ORAL | 0 refills | Status: AC | PRN

## 2020-02-07 MED ORDER — ONDANSETRON 4 MG PO TBDP: 4.0000 mg | ORAL_TABLET | Freq: Three times a day (TID) | ORAL | 0 refills | Status: DC | PRN

## 2020-02-07 MED ORDER — CEPHALEXIN 500 MG PO CAPS: 500.0000 mg | ORAL_CAPSULE | Freq: Four times a day (QID) | ORAL | 0 refills | Status: AC

## 2020-02-07 NOTE — Progress Notes (Signed)
[place]   Telephone:([phone]) [phone] [phone]) [phone]   Clinic Follow up Note   Patient Care Team: Ma Hillock, DO as PCP - General (Family Medicine) Truitt Merle, MD as Consulting Physician (Hematology) Jovita Kussmaul, MD as Consulting Physician (General Surgery) Gery Pray, MD as Consulting Physician (Radiation Oncology) Sable Feil, MD as Consulting Physician (Gastroenterology) Molli Posey, MD as Consulting Physician (Obstetrics and Gynecology) Delice Bison Charlestine Massed, NP as Nurse Practitioner (Hematology and Oncology) Elayne Snare, MD as Consulting Physician (Endocrinology) 02/07/2020  I connected with Olivia [name] on 02/07/20 at  9:15 AM EST by telephone visit and verified that I am speaking with the correct person using two identifiers.   I discussed the limitations, risks, security and privacy concerns of performing an evaluation and management service by telemedicine and the availability of in-person appointments. I also discussed with the patient that there may be a patient responsible charge related to this service. The patient expressed understanding and agreed to proceed.   Other persons participating in the visit and their role in the encounter: None  Patient's location: Home  Provider's location: Home office   CHIEF COMPLAINT: Follow up right breast cancer, medication check    SUMMARY OF ONCOLOGIC HISTORY: Oncology History Overview Note  Cancer Staging Malignant neoplasm of upper-inner quadrant of right breast in female, estrogen receptor positive ([place]) Staging form: Breast, AJCC 8th Edition - Clinical stage from 10/09/2016: Stage IA (cT1c, cN0, cM0, G3, ER: Positive, PR: Positive, HER2: Negative) - Signed by Truitt Merle, MD on 10/14/2016 - Pathologic stage from 11/17/2016: Stage IB (pT2, pN0, cM0, G3, ER: Positive, PR: Positive, HER2: Negative, Oncotype DX score: 21) - Signed by Truitt Merle, MD on 12/11/2016     Malignant neoplasm of  upper-inner quadrant of right breast in female, estrogen receptor positive ([place])  10/08/2016 Mammogram   Diagnostic mammogram and ultrasound showed an irregular mass in the right breast at 1 o'clock, 5 cm from the nipple measuring 1.6 x 1.4x 1.6 cm, axilla was negative for adenopathy.   10/09/2016 Receptors her2   Estrogen Receptor: 80%, POSITIVE, STRONG STAINING INTENSITY Progesterone Receptor: 30%, POSITIVE, STRONG STAINING INTENSITY Proliferation Marker Ki67: 35% HER2 NEGATIVE    10/09/2016 Initial Diagnosis   Malignant neoplasm of upper-inner quadrant of right breast in female, estrogen receptor positive ([place])   10/09/2016 Initial Biopsy   Diagnosis Breast, right, needle core biopsy, upper inner quadrant, 1:00 o'clock - INVASIVE DUCTAL CARCINOMA, G3   10/2016 Genetic Testing   Genetic testing was negative for The genes analyzed were the 23 genes on [facility]'s Breast/GYN panel (ATM, BARD1, BRCA1, BRCA2, BRIP1, CDH1, CHEK2, DICER1, EPCAM, MLH1,  MSH2, MSH6, NBN, NF1, PALB2, PMS2, PTEN, RAD50, RAD51C, RAD51D,SMARCA4, STK11, and TP53).     11/17/2016 Surgery   RIGHT MASTECTOMY AND LEFT PROPHYLATIC MASTECTOMY by Dr. Marlou Starks  IMEDITATE BREAST RECONSTRUCTION WITH PLACEMENT OF TISSUE EXPANDER AND FLEX HD (ACELLULAR HYDRATED DERMIS) by Dr. Marla Roe     11/17/2016 Pathology Results   Diagnosis 11/17/16 1. Breast, simple mastectomy, Left - BENIGN BREAST TISSUE WITH ECTATIC DUCTS. - ONE OF ONE LYMPH NODE NEGATIVE FOR CARCINOMA (0/1). 2. Breast, simple mastectomy, Right - INVASIVE DUCTAL CARCINOMA, GRADE 3, SPANNING 2.4 CM. - HIGH GRADE DUCTAL CARCINOMA IN SITU. - INVASIVE CARCINOMA IS <0.1 CM OF THE POSTERIOR MARGIN (BROADLY) AND ANTERIOR MARGIN (FOCALLY). - IN SITU CARCINOMA IS >0.2 CM OF BOTH MARGINS. - PRIOR LUMPECTOMY SITE, NEGATIVE FOR CARCINOMA. - SEE ONCOLOGY TABLE. 3. Breast, excision, Superior flap deep  margin - BENIGN FIBROADIPOSE TISSUE.   11/17/2016 Miscellaneous   Oncotype on  11/17/16 Rcurence Risk Score of 21 10-Year risk of distant reucrrence with Tamoxifen alone is 13%    12/18/2016 -  Anti-estrogen oral therapy   Letrozole 2.5 mg daily; began 12/18/16   12/24/2016 Imaging   BONE SCAN IMPRESSION: No evidence of osseous metastatic disease.   12/24/2016 Imaging   CT CAP IMPRESSION: 1. Status post bilateral modified radical mastectomy and breast reconstruction, as above. No findings to suggest metastatic disease in the chest, abdomen or pelvis. 2. Multiple heterogeneous appearing thyroid nodules, as discussed above. These are nonspecific but further evaluation with nonemergent thyroid ultrasound is recommended in the near future to determine any potential need for further evaluation with fine-needle aspiration. 3. Incidental findings, as above.   02/11/2017 Surgery   REMOVAL OF BILATERAL TISSUE EXPANDERS WITH PLACEMENT OF BILATERAL SILICONE BREAST IMPLANTS by Dr. Marla Roe      CURRENT THERAPY:  1. Adjuvant Letrozole2.5 mg daily; began11/15/18, changed to exemestane 11/2019 due to joint pain  2. Prolia injections every 6 months since 2016 through her PCP  INTERVAL HISTORY: Olivia [name] presents by phone for scheduled visit. She identifies herself appropriately. She continues exemestane, but can't tell much difference in joint pain. She has stable pain and stiffness in hands, hips, knees, and feet. Denies new pain. Stiffness/pain improves as the day progresses and she is more active. She started jogging and is very sore next day. Takes diclofenac with some relief. She does not want to change meds at this point. Cost of exemestane is affordable for now. She is interested in speaking with dietician about anti-inflammatory foods and weight loss. Scheduled for reconstruction on 1/20 with Dr. Marla Roe. She is up to date on other screenings and vaccines. Denies fever, chills, cough, chest pain, dyspnea, bleeding, abdominal pain, hot flashes or other issues.     MEDICAL HISTORY:  Past Medical History:  Diagnosis Date  . Adjustment insomnia 02/23/2019  . Breast cancer ([place]) 2018   Lumpectomy, chemotherapy and radiation with recurrence 2018. Now on Femara  . COVID-19 02/2019   Her husband also passed away at this time from COVID-19.  . Family history of breast cancer   . Genetic testing 10/30/2016   Breast/GYN panel (23 genes) @ [facility] - No pathogenic mutations detected  . GERD (gastroesophageal reflux disease)    OTC medication  . Grief reaction 02/23/2019  . Heart murmur    as a child - Echo in early 20s "ok"  . History of echocardiogram 05/2015   Echo 4/17: EF 55-60%, GLS -19.6%, normal wall motion  . History of exercise stress test 05/2015   ETT 4/17 - No ST changes  . HLD (hyperlipidemia)   . Multiple thyroid nodules 2009   Multiple nodules: Normal ultrasound reported at diagnosis  . Osteoporosis   . Personal history of chemotherapy   . Personal history of radiation therapy     SURGICAL HISTORY: Past Surgical History:  Procedure Laterality Date  . BREAST LUMPECTOMY Right 2003  . BREAST RECONSTRUCTION WITH PLACEMENT OF TISSUE EXPANDER AND FLEX HD (ACELLULAR HYDRATED DERMIS) Bilateral 11/17/2016   Procedure: IMEDITATE BREAST RECONSTRUCTION WITH PLACEMENT OF TISSUE EXPANDER AND FLEX HD (ACELLULAR HYDRATED DERMIS);  Surgeon: Wallace Going, DO;  Location: [facility];  Service: Plastics;  Laterality: Bilateral;  . BREAST SURGERY  2003 /right   lumpectomy; chemotherapy and radiation  . REMOVAL OF BILATERAL TISSUE EXPANDERS WITH PLACEMENT OF BILATERAL BREAST IMPLANTS Bilateral 02/11/2017  Procedure: REMOVAL OF BILATERAL TISSUE EXPANDERS WITH PLACEMENT OF BILATERAL SILICONE BREAST IMPLANTS;  Surgeon: Wallace Going, DO;  Location: [place];  Service: Plastics;  Laterality: Bilateral;  . TOTAL ABDOMINAL HYSTERECTOMY W/ BILATERAL SALPINGOOPHORECTOMY     for treatment of breast treatment  . TOTAL MASTECTOMY  Bilateral 11/17/2016   Procedure: RIGHT MASTECTOMY AND LEFT PROPHYLATIC MASTECTOMY;  Surgeon: Jovita Kussmaul, MD;  Location: [place];  Service: General;  Laterality: Bilateral;    I have reviewed the social history and family history with the patient and they are unchanged from previous note.  ALLERGIES:  has No Known Allergies.  MEDICATIONS:  Current Outpatient Medications  Medication Sig Dispense Refill  . atorvastatin (LIPITOR) 20 MG tablet Take 1 tablet (20 mg total) by mouth daily. 45 tablet 3  . Calcium 500 MG CHEW Chew by mouth.    . cetirizine (ZYRTEC) 10 MG tablet Take 10 mg by mouth as needed.    . Collagenase POWD by Does not apply route.    Marland Kitchen denosumab (PROLIA) 60 MG/ML SOLN injection Inject 60 mg into the skin every 6 (six) months. Administer in upper arm, thigh, or abdomen 1 Syringe 1  . diclofenac (VOLTAREN) 75 MG EC tablet Take 1 tablet (75 mg total) by mouth 2 (two) times daily. 60 tablet 5  . ELDERBERRY PO Take by mouth.    Marland Kitchen exemestane (AROMASIN) 25 MG tablet TAKE 1 TABLET (25 MG TOTAL) BY MOUTH DAILY AFTER BREAKFAST. 90 tablet 1  . psyllium (METAMUCIL) 58.6 % powder Take 1 packet by mouth daily.    . Vitamin D, Cholecalciferol, 25 MCG (1000 UT) CAPS Take 1 capsule by mouth daily.     No current facility-administered medications for this visit.    PHYSICAL EXAMINATION: ECOG PERFORMANCE STATUS: 1 - Symptomatic but completely ambulatory  There were no vitals filed for this visit. There were no vitals filed for this visit.  Patient appears well over the phone. Speech is clear. Mood and affect appear appropriate. No cough or conversational dyspnea    LABORATORY DATA:  No labs for this visit     RADIOGRAPHIC STUDIES: I have personally reviewed the radiological images as listed and agreed with the findings in the report. No results found.   ASSESSMENT & PLAN: LYSHA SCHRADE a 60 y.o.postmenopausal Caucasian female with a history of Heart murmur, HLD, and  osteoporosis.  1. Malignant neoplasm of upper-inner quadrant of right breast, invasive ductal carcinoma,pT2N0, M0), Stage 1B, ER/PR: positive, HER2 negative, Grade 3, Oncotype RS 21 -diagnosed 10/2016, s/p bilateral mastectomy and reconstruction. Based on oncotype RS she did not need adjuvant chemotherapy. And given node-negative mastectomy she did not require adjuvant radiation -started adjuvant letrozole 12/2016, plan for total 7 years. She was changed to exemestane 11/2019 due to arthralgia  -continue surveillance, no role for mammogram given b/l mastectomy   2. H/o right breast cancer pT2N1M0 ER/PR+ HER2 - 2003 -s/p lumpectomy and ALND (3/7 + LNs), chemo, radiation, and tamoxifen for 2 years then anastrozole for 3 years -s/p TAH/BSO 2006 -treated by Dr. Eston Esters   3. Genetic testingnegative  4. Osteoporosis - improved to osteopenia 2018  -initially on fosamax x5 years, then on prolia q6 months   5. Thyroid nodules  -f/up PCP and endocrinology    Dispo:  Ms. Ragsdale appears to be doing well, with stable arthrlagia on exemestane, she is willing to continue for now. If pain worsens or becomes cost prohibitive, will change to anastrozole.  She is requesting dietician referral for anti-inflammatory diet and weight loss. I have referred her.   She will proceed with breast reconstruction on 02/23/20 by Dr. Marla Roe, she can hold exemestane perioperatively until she can resume activity given her pain is worse when less active.   F/up in person 05/08/19, or sooner if needed. She knows to call if any new or worsening issues arise.     Orders Placed This Encounter  Procedures  . Ambulatory referral to Nutrition and Diabetic E    Referral Priority:   Routine    Referral Type:   Consultation    Referral Reason:   Specialty Services Required    Number of Visits Requested:   1   All questions were answered. The patient knows to call the clinic with any problems, questions or  concerns. No barriers to learning was detected. I spent 9 minutes on today's phone call.      Alla Feeling, NP 02/07/20

## 2020-02-13 ENCOUNTER — Encounter: Payer: Self-pay | Admitting: Plastic Surgery

## 2020-02-13 ENCOUNTER — Other Ambulatory Visit: Payer: Self-pay

## 2020-02-13 ENCOUNTER — Ambulatory Visit (INDEPENDENT_AMBULATORY_CARE_PROVIDER_SITE_OTHER): Payer: Self-pay | Admitting: Plastic Surgery

## 2020-02-13 VITALS — BP 134/84 | HR 70

## 2020-02-13 DIAGNOSIS — Z719 Counseling, unspecified: Secondary | ICD-10-CM

## 2020-02-13 NOTE — Progress Notes (Signed)
Preoperative Dx: hyperpigmentation   Postoperative Dx:  same  Procedure: laser to neck and chest   Anesthesia: none  Description of Procedure:  Risks and complications were explained to the patient. Consent was confirmed and signed. Time out was called and all information was confirmed to be correct. The area  area was prepped with alcohol and wiped dry. The IPL PR laser was set at 7.6 J/cm2. The neck and chest were lasered. The patient tolerated the procedure well and there were no complications. The patient is to follow up in 6 weeks.

## 2020-02-15 ENCOUNTER — Inpatient Hospital Stay: Payer: BC Managed Care – PPO | Attending: Hematology | Admitting: Nutrition

## 2020-02-15 ENCOUNTER — Other Ambulatory Visit: Payer: Self-pay

## 2020-02-15 NOTE — Progress Notes (Signed)
Patient is requesting assistance with anti-inflammatory foods.  She also reports concern regarding weight gain.  I provided brief education on anti-inflammatory diet and encourage patient to use phone app to document food intake.  Reviewed basic strategies for increasing fruits, vegetables and whole grains.  Continue exercise as tolerated.  Consider weight training to improve muscle mass as tolerated.  Continue adequate fluid intake.  Fact sheets were given.

## 2020-02-17 ENCOUNTER — Other Ambulatory Visit: Payer: Self-pay

## 2020-02-17 ENCOUNTER — Encounter (HOSPITAL_BASED_OUTPATIENT_CLINIC_OR_DEPARTMENT_OTHER): Payer: Self-pay | Admitting: Plastic Surgery

## 2020-02-20 ENCOUNTER — Other Ambulatory Visit (HOSPITAL_COMMUNITY): Payer: BC Managed Care – PPO

## 2020-02-21 ENCOUNTER — Other Ambulatory Visit (HOSPITAL_COMMUNITY)
Admission: RE | Admit: 2020-02-21 | Discharge: 2020-02-21 | Disposition: A | Discharge status: Home / Self Care | Payer: BC Managed Care – PPO | Source: Ambulatory Visit | Attending: Plastic Surgery | Admitting: Plastic Surgery

## 2020-02-21 DIAGNOSIS — Z01812 Encounter for preprocedural laboratory examination: Secondary | ICD-10-CM | POA: Insufficient documentation

## 2020-02-21 DIAGNOSIS — Z923 Personal history of irradiation: Secondary | ICD-10-CM | POA: Diagnosis not present

## 2020-02-21 DIAGNOSIS — Z20822 Contact with and (suspected) exposure to covid-19: Secondary | ICD-10-CM | POA: Insufficient documentation

## 2020-02-21 DIAGNOSIS — Z8616 Personal history of COVID-19: Secondary | ICD-10-CM | POA: Diagnosis not present

## 2020-02-21 DIAGNOSIS — Z79811 Long term (current) use of aromatase inhibitors: Secondary | ICD-10-CM | POA: Diagnosis not present

## 2020-02-21 DIAGNOSIS — Z79899 Other long term (current) drug therapy: Secondary | ICD-10-CM | POA: Diagnosis not present

## 2020-02-21 DIAGNOSIS — Z791 Long term (current) use of non-steroidal anti-inflammatories (NSAID): Secondary | ICD-10-CM | POA: Diagnosis not present

## 2020-02-21 DIAGNOSIS — N650 Deformity of reconstructed breast: Secondary | ICD-10-CM | POA: Diagnosis not present

## 2020-02-21 DIAGNOSIS — C50911 Malignant neoplasm of unspecified site of right female breast: Secondary | ICD-10-CM | POA: Diagnosis not present

## 2020-02-21 DIAGNOSIS — Z9221 Personal history of antineoplastic chemotherapy: Secondary | ICD-10-CM | POA: Diagnosis not present

## 2020-02-21 DIAGNOSIS — Z9013 Acquired absence of bilateral breasts and nipples: Secondary | ICD-10-CM | POA: Diagnosis not present

## 2020-02-21 LAB — SARS CORONAVIRUS 2 (TAT 6-24 HRS): SARS Coronavirus 2: NEGATIVE

## 2020-02-22 ENCOUNTER — Inpatient Hospital Stay (HOSPITAL_COMMUNITY): Admission: RE | Admit: 2020-02-22 | Payer: BC Managed Care – PPO | Source: Ambulatory Visit

## 2020-02-23 ENCOUNTER — Other Ambulatory Visit: Payer: Self-pay

## 2020-02-23 ENCOUNTER — Ambulatory Visit (HOSPITAL_BASED_OUTPATIENT_CLINIC_OR_DEPARTMENT_OTHER): Payer: BC Managed Care – PPO | Admitting: Certified Registered Nurse Anesthetist

## 2020-02-23 ENCOUNTER — Encounter (HOSPITAL_BASED_OUTPATIENT_CLINIC_OR_DEPARTMENT_OTHER): Payer: Self-pay | Admitting: Plastic Surgery

## 2020-02-23 ENCOUNTER — Ambulatory Visit (HOSPITAL_BASED_OUTPATIENT_CLINIC_OR_DEPARTMENT_OTHER)
Admission: RE | Admit: 2020-02-23 | Discharge: 2020-02-23 | Disposition: A | Discharge status: Home / Self Care | Payer: BC Managed Care – PPO | Attending: Plastic Surgery | Admitting: Plastic Surgery

## 2020-02-23 ENCOUNTER — Encounter (HOSPITAL_BASED_OUTPATIENT_CLINIC_OR_DEPARTMENT_OTHER)
Admission: RE | Disposition: A | Discharge status: Home / Self Care | Payer: Self-pay | Source: Home / Self Care | Attending: Plastic Surgery

## 2020-02-23 DIAGNOSIS — Z9013 Acquired absence of bilateral breasts and nipples: Secondary | ICD-10-CM | POA: Insufficient documentation

## 2020-02-23 DIAGNOSIS — N650 Deformity of reconstructed breast: Secondary | ICD-10-CM | POA: Diagnosis not present

## 2020-02-23 DIAGNOSIS — Z923 Personal history of irradiation: Secondary | ICD-10-CM | POA: Insufficient documentation

## 2020-02-23 DIAGNOSIS — C50911 Malignant neoplasm of unspecified site of right female breast: Secondary | ICD-10-CM | POA: Insufficient documentation

## 2020-02-23 DIAGNOSIS — Z79899 Other long term (current) drug therapy: Secondary | ICD-10-CM | POA: Insufficient documentation

## 2020-02-23 DIAGNOSIS — Z01812 Encounter for preprocedural laboratory examination: Secondary | ICD-10-CM | POA: Diagnosis not present

## 2020-02-23 DIAGNOSIS — K219 Gastro-esophageal reflux disease without esophagitis: Secondary | ICD-10-CM | POA: Diagnosis not present

## 2020-02-23 DIAGNOSIS — I10 Essential (primary) hypertension: Secondary | ICD-10-CM | POA: Diagnosis not present

## 2020-02-23 DIAGNOSIS — N651 Disproportion of reconstructed breast: Secondary | ICD-10-CM | POA: Diagnosis not present

## 2020-02-23 DIAGNOSIS — Z9221 Personal history of antineoplastic chemotherapy: Secondary | ICD-10-CM | POA: Diagnosis not present

## 2020-02-23 DIAGNOSIS — Z79811 Long term (current) use of aromatase inhibitors: Secondary | ICD-10-CM | POA: Insufficient documentation

## 2020-02-23 DIAGNOSIS — Z8616 Personal history of COVID-19: Secondary | ICD-10-CM | POA: Insufficient documentation

## 2020-02-23 DIAGNOSIS — Z20822 Contact with and (suspected) exposure to covid-19: Secondary | ICD-10-CM | POA: Insufficient documentation

## 2020-02-23 DIAGNOSIS — E7800 Pure hypercholesterolemia, unspecified: Secondary | ICD-10-CM | POA: Diagnosis not present

## 2020-02-23 DIAGNOSIS — Z791 Long term (current) use of non-steroidal anti-inflammatories (NSAID): Secondary | ICD-10-CM | POA: Insufficient documentation

## 2020-02-23 HISTORY — PX: LIPOSUCTION WITH LIPOFILLING: SHX6436

## 2020-02-23 SURGERY — LIPOSUCTION, WITH FAT TRANSFER
Anesthesia: General | Site: Breast | Laterality: Bilateral

## 2020-02-23 MED ORDER — PROPOFOL 10 MG/ML IV BOLUS
INTRAVENOUS | Status: AC
  Filled 2020-02-23: qty 40

## 2020-02-23 MED ORDER — LIDOCAINE HCL (CARDIAC) PF 100 MG/5ML IV SOSY
PREFILLED_SYRINGE | INTRAVENOUS | Status: DC | PRN
  Administered 2020-02-23: 40 mg via INTRATRACHEAL

## 2020-02-23 MED ORDER — DEXAMETHASONE SODIUM PHOSPHATE 10 MG/ML IJ SOLN
INTRAMUSCULAR | Status: AC
  Filled 2020-02-23: qty 1

## 2020-02-23 MED ORDER — FENTANYL CITRATE (PF) 100 MCG/2ML IJ SOLN
INTRAMUSCULAR | Status: AC
  Filled 2020-02-23: qty 2

## 2020-02-23 MED ORDER — OXYCODONE HCL 5 MG PO TABS: 5.0000 mg | ORAL_TABLET | ORAL | Status: DC | PRN

## 2020-02-23 MED ORDER — CHLORHEXIDINE GLUCONATE CLOTH 2 % EX PADS: 6.0000 | MEDICATED_PAD | Freq: Once | CUTANEOUS | Status: DC

## 2020-02-23 MED ORDER — SODIUM CHLORIDE 0.9% FLUSH: 3.0000 mL | Freq: Two times a day (BID) | INTRAVENOUS | Status: DC

## 2020-02-23 MED ORDER — PROPOFOL 10 MG/ML IV BOLUS
INTRAVENOUS | Status: DC | PRN
  Administered 2020-02-23: 150 mg via INTRAVENOUS

## 2020-02-23 MED ORDER — CELECOXIB 200 MG PO CAPS
ORAL_CAPSULE | ORAL | Status: AC
  Filled 2020-02-23: qty 1

## 2020-02-23 MED ORDER — LIDOCAINE-EPINEPHRINE 1 %-1:100000 IJ SOLN
INTRAMUSCULAR | Status: DC | PRN
  Administered 2020-02-23: 3 mL

## 2020-02-23 MED ORDER — FENTANYL CITRATE (PF) 250 MCG/5ML IJ SOLN
INTRAMUSCULAR | Status: DC | PRN
  Administered 2020-02-23: 50 ug via INTRAVENOUS

## 2020-02-23 MED ORDER — CEFAZOLIN SODIUM-DEXTROSE 2-4 GM/100ML-% IV SOLN
INTRAVENOUS | Status: AC
  Filled 2020-02-23: qty 100

## 2020-02-23 MED ORDER — LACTATED RINGERS IV SOLN: INTRAVENOUS | Status: DC

## 2020-02-23 MED ORDER — CEFAZOLIN SODIUM-DEXTROSE 2-4 GM/100ML-% IV SOLN
2.0000 g | INTRAVENOUS | Status: AC
  Administered 2020-02-23: 2 g via INTRAVENOUS

## 2020-02-23 MED ORDER — ACETAMINOPHEN 500 MG PO TABS
ORAL_TABLET | ORAL | Status: AC
  Filled 2020-02-23: qty 2

## 2020-02-23 MED ORDER — FENTANYL CITRATE (PF) 100 MCG/2ML IJ SOLN
25.0000 ug | INTRAMUSCULAR | Status: DC | PRN
Start: 2020-02-23 — End: 2020-02-23

## 2020-02-23 MED ORDER — MIDAZOLAM HCL 2 MG/2ML IJ SOLN
INTRAMUSCULAR | Status: AC
  Filled 2020-02-23: qty 2

## 2020-02-23 MED ORDER — AMISULPRIDE (ANTIEMETIC) 5 MG/2ML IV SOLN: 10.0000 mg | Freq: Once | INTRAVENOUS | Status: DC | PRN

## 2020-02-23 MED ORDER — GLYCOPYRROLATE PF 0.2 MG/ML IJ SOSY
PREFILLED_SYRINGE | INTRAMUSCULAR | Status: AC
  Filled 2020-02-23: qty 1

## 2020-02-23 MED ORDER — ACETAMINOPHEN 325 MG RE SUPP: 650.0000 mg | RECTAL | Status: DC | PRN

## 2020-02-23 MED ORDER — MIDAZOLAM HCL 2 MG/2ML IJ SOLN
INTRAMUSCULAR | Status: DC | PRN
  Administered 2020-02-23: 2 mg via INTRAVENOUS

## 2020-02-23 MED ORDER — CELECOXIB 200 MG PO CAPS
200.0000 mg | ORAL_CAPSULE | Freq: Once | ORAL | Status: AC
  Administered 2020-02-23: 200 mg via ORAL

## 2020-02-23 MED ORDER — FENTANYL CITRATE (PF) 100 MCG/2ML IJ SOLN: 25.0000 ug | INTRAMUSCULAR | Status: DC | PRN

## 2020-02-23 MED ORDER — SODIUM CHLORIDE 0.9% FLUSH: 3.0000 mL | INTRAVENOUS | Status: DC | PRN

## 2020-02-23 MED ORDER — ACETAMINOPHEN 325 MG PO TABS: 650.0000 mg | ORAL_TABLET | ORAL | Status: DC | PRN

## 2020-02-23 MED ORDER — LIDOCAINE 2% (20 MG/ML) 5 ML SYRINGE
INTRAMUSCULAR | Status: AC
  Filled 2020-02-23: qty 5

## 2020-02-23 MED ORDER — ONDANSETRON HCL 4 MG/2ML IJ SOLN
INTRAMUSCULAR | Status: DC | PRN
  Administered 2020-02-23: 4 mg via INTRAVENOUS

## 2020-02-23 MED ORDER — ACETAMINOPHEN 500 MG PO TABS
1000.0000 mg | ORAL_TABLET | Freq: Once | ORAL | Status: AC
  Administered 2020-02-23: 1000 mg via ORAL

## 2020-02-23 MED ORDER — DEXAMETHASONE SODIUM PHOSPHATE 10 MG/ML IJ SOLN
INTRAMUSCULAR | Status: DC | PRN
  Administered 2020-02-23: 8 mg via INTRAVENOUS

## 2020-02-23 MED ORDER — ONDANSETRON HCL 4 MG/2ML IJ SOLN
INTRAMUSCULAR | Status: AC
  Filled 2020-02-23: qty 2

## 2020-02-23 MED ORDER — LIDOCAINE HCL 1 % IJ SOLN
INTRAVENOUS | Status: DC | PRN
  Administered 2020-02-23: 750 mL

## 2020-02-23 MED ORDER — GLYCOPYRROLATE 0.2 MG/ML IJ SOLN
INTRAMUSCULAR | Status: DC | PRN
  Administered 2020-02-23: .1 mg via INTRAVENOUS

## 2020-02-23 MED ORDER — SODIUM CHLORIDE 0.9 % IV SOLN: 250.0000 mL | INTRAVENOUS | Status: DC | PRN

## 2020-02-23 SURGICAL SUPPLY — 51 items
ADH SKN CLS APL DERMABOND .7 (GAUZE/BANDAGES/DRESSINGS) ×1
BINDER ABDOMINAL  9 SM 30-45 (SOFTGOODS)
BINDER ABDOMINAL 10 UNV 27-48 (MISCELLANEOUS) ×1 IMPLANT
BINDER ABDOMINAL 12 SM 30-45 (SOFTGOODS) IMPLANT
BINDER ABDOMINAL 9 SM 30-45 (SOFTGOODS) IMPLANT
BINDER BREAST LRG (GAUZE/BANDAGES/DRESSINGS) ×1 IMPLANT
BINDER BREAST MEDIUM (GAUZE/BANDAGES/DRESSINGS) IMPLANT
BINDER BREAST XLRG (GAUZE/BANDAGES/DRESSINGS) IMPLANT
BINDER BREAST XXLRG (GAUZE/BANDAGES/DRESSINGS) IMPLANT
BLADE HEX COATED 2.75 (ELECTRODE) IMPLANT
BLADE SURG 15 STRL LF DISP TIS (BLADE) ×1 IMPLANT
BLADE SURG 15 STRL SS (BLADE) ×2
COVER BACK TABLE 60X90IN (DRAPES) ×2 IMPLANT
COVER MAYO STAND STRL (DRAPES) ×2 IMPLANT
COVER WAND RF STERILE (DRAPES) IMPLANT
DECANTER SPIKE VIAL GLASS SM (MISCELLANEOUS) IMPLANT
DERMABOND ADVANCED (GAUZE/BANDAGES/DRESSINGS) ×1
DERMABOND ADVANCED .7 DNX12 (GAUZE/BANDAGES/DRESSINGS) ×1 IMPLANT
DRAPE LAPAROSCOPIC ABDOMINAL (DRAPES) ×2 IMPLANT
DRSG HYDROCOLLOID 4X4 (GAUZE/BANDAGES/DRESSINGS) ×1 IMPLANT
DRSG PAD ABDOMINAL 8X10 ST (GAUZE/BANDAGES/DRESSINGS) ×6 IMPLANT
ELECT REM PT RETURN 9FT ADLT (ELECTROSURGICAL) ×2
ELECTRODE REM PT RTRN 9FT ADLT (ELECTROSURGICAL) ×1 IMPLANT
EXTRACTOR CANIST REVOLVE STRL (CANNISTER) ×2 IMPLANT
GLOVE SURG ENC MOIS LTX SZ6.5 (GLOVE) ×6 IMPLANT
GLOVE SURG UNDER POLY LF SZ7 (GLOVE) ×1 IMPLANT
GOWN STRL REUS W/ TWL LRG LVL3 (GOWN DISPOSABLE) ×2 IMPLANT
GOWN STRL REUS W/TWL LRG LVL3 (GOWN DISPOSABLE) ×8
IV LACTATED RINGERS 1000ML (IV SOLUTION) ×5 IMPLANT
LINER CANISTER 1000CC FLEX (MISCELLANEOUS) ×4 IMPLANT
NDL HYPO 25X1 1.5 SAFETY (NEEDLE) IMPLANT
NDL SAFETY ECLIPSE 18X1.5 (NEEDLE) ×1 IMPLANT
NEEDLE HYPO 18GX1.5 SHARP (NEEDLE) ×2
NEEDLE HYPO 25X1 1.5 SAFETY (NEEDLE) ×2 IMPLANT
PACK BASIN DAY SURGERY FS (CUSTOM PROCEDURE TRAY) ×2 IMPLANT
PAD ALCOHOL SWAB (MISCELLANEOUS) ×2 IMPLANT
PENCIL SMOKE EVACUATOR (MISCELLANEOUS) IMPLANT
SLEEVE SCD COMPRESS KNEE MED (MISCELLANEOUS) ×2 IMPLANT
SPONGE LAP 18X18 RF (DISPOSABLE) ×3 IMPLANT
SUT MNCRL AB 4-0 PS2 18 (SUTURE) IMPLANT
SUT MON AB 5-0 PS2 18 (SUTURE) ×4 IMPLANT
SYR 10ML LL (SYRINGE) ×8 IMPLANT
SYR 3ML 18GX1 1/2 (SYRINGE) ×1 IMPLANT
SYR 50ML LL SCALE MARK (SYRINGE) ×5 IMPLANT
SYR CONTROL 10ML LL (SYRINGE) ×2 IMPLANT
SYR TOOMEY 50ML (SYRINGE) ×4 IMPLANT
TOWEL GREEN STERILE FF (TOWEL DISPOSABLE) ×4 IMPLANT
TRAY DSU PREP LF (CUSTOM PROCEDURE TRAY) ×2 IMPLANT
TUBING INFILTRATION IT-10001 (TUBING) ×1 IMPLANT
TUBING SET GRADUATE ASPIR 12FT (MISCELLANEOUS) ×2 IMPLANT
UNDERPAD 30X36 HEAVY ABSORB (UNDERPADS AND DIAPERS) ×4 IMPLANT

## 2020-02-23 NOTE — Discharge Instructions (Addendum)
Next dose of Tylenol can be given at 2:30pm if needed. Next dose of NSAID (Ibuprofen/Motrin/Aleve) 4:30pm if needed.   INSTRUCTIONS FOR AFTER SURGERY   You will likely have some questions about what to expect following your operation.  The following information will help you and your family understand what to expect when you are discharged from the hospital.  Following these guidelines will help ensure a smooth recovery and reduce risks of complications.  Postoperative instructions include information on: diet, wound care, medications and physical activity.  AFTER SURGERY Expect to go home after the procedure.  In some cases, you may need to spend one night in the hospital for observation.  DIET This surgery does not require a specific diet.  However, I have to mention that the healthier you eat the better your body can start healing. It is important to increasing your protein intake.  This means limiting the foods with added sugar.  Focus on fruits and vegetables and some meat. It is very important to drink water after your surgery.  If your urine is bright yellow, then it is concentrated, and you need to drink more water.  As a general rule after surgery, you should have 8 ounces of water every hour while awake.  If you find you are persistently nauseated or unable to take in liquids let us know.  NO TOBACCO USE or EXPOSURE.  This will slow your healing process and increase the risk of a wound.  WOUND CARE If you don't have a drain: You can shower the day after surgery.  Use fragrance free soap.  Dial, [place], [place] and Cetaphil are usually mild on the skin.  If you have steri-strips / tape directly attached to your skin leave them in place. It is OK to get these wet.  No baths, pools or hot tubs for two weeks. We close your incision to leave the smallest and best-looking scar. No ointment or creams on your incisions until given the go ahead.  Especially not Neosporin (Too many skin reactions with  this one).  A few weeks after surgery you can use Mederma and start massaging the scar. We ask you to wear your binder or sports bra for the first 6 weeks around the clock, including while sleeping. This provides added comfort and helps reduce the fluid accumulation at the surgery site.  ACTIVITY No heavy lifting until cleared by the doctor.  It is OK to walk and climb stairs. In fact, moving your legs is very important to decrease your risk of a blood clot.  It will also help keep you from getting deconditioned.  Every 1 to 2 hours get up and walk for 5 minutes. This will help with a quicker recovery back to normal.  Let pain be your guide so you don't do too much.  NO, you cannot do the spring cleaning and don't plan on taking care of anyone else.  This is your time for TLC.   WORK Everyone returns to work at different times. As a rough guide, most people take at least 1 - 2 weeks off prior to returning to work. If you need documentation for your job, bring the forms to your postoperative follow up visit.  DRIVING Arrange for someone to bring you home from the hospital.  You may be able to drive a few days after surgery but not while taking any narcotics or valium.  BOWEL MOVEMENTS Constipation can occur after anesthesia and while taking pain medication.  It is  important to stay ahead for your comfort.  We recommend taking Milk of Magnesia (2 tablespoons; twice a day) while taking the pain pills.  SEROMA This is fluid your body tried to put in the surgical site.  This is normal but if it creates excessive pain and swelling let us know.  It usually decreases in a few weeks.  MEDICATIONS and PAIN CONTROL At your preoperative visit for you history and physical you were given the following medications: 1. An antibiotic: Start this medication when you get home and take according to the instructions on the bottle. 2. Zofran 4 mg:  This is to treat nausea and vomiting.  You can take this every 6  hours as needed and only if needed. 3. Norco (hydrocodone/acetaminophen) 5/325 mg:  This is only to be used after you have taken the motrin or the tylenol. Every 8 hours as needed. Over the counter Medication to take: 4. Ibuprofen (Motrin) 600 mg:  Take this every 6 hours.  If you have additional pain then take 500 mg of the tylenol.  Only take the Norco after you have tried these two. 5. Miralax or stool softener of choice: Take this according to the bottle if you take the [place] Call your surgeon's office if any of the following occur:  Fever 101 degrees F or greater  Excessive bleeding or fluid from the incision site.  Pain that increases over time without aid from the medications  Redness, warmth, or pus draining from incision sites  Persistent nausea or inability to take in liquids  Severe misshapen area that underwent the operation.   Post Anesthesia Home Care Instructions  Activity: Get plenty of rest for the remainder of the day. A responsible individual must stay with you for 24 hours following the procedure.  For the next 24 hours, DO NOT: -Drive a car -Paediatric nurse -Drink alcoholic beverages -Take any medication unless instructed by your physician -Make any legal decisions or sign important papers.  Meals: Start with liquid foods such as gelatin or soup. Progress to regular foods as tolerated. Avoid greasy, spicy, heavy foods. If nausea and/or vomiting occur, drink only clear liquids until the nausea and/or vomiting subsides. Call your physician if vomiting continues.  Special Instructions/Symptoms: Your throat may feel dry or sore from the anesthesia or the breathing tube placed in your throat during surgery. If this causes discomfort, gargle with warm salt water. The discomfort should disappear within 24 hours.  If you had a scopolamine patch placed behind your ear for the management of post- operative nausea and/or vomiting:  1. The medication  in the patch is effective for 72 hours, after which it should be removed.  Wrap patch in a tissue and discard in the trash. Wash hands thoroughly with soap and water. 2. You may remove the patch earlier than 72 hours if you experience unpleasant side effects which may include dry mouth, dizziness or visual disturbances. 3. Avoid touching the patch. Wash your hands with soap and water after contact with the patch.

## 2020-02-23 NOTE — Transfer of Care (Signed)
Immediate Anesthesia Transfer of Care Note  Patient: Olivia [name]  Procedure(s) Performed: Fat filling of breasts for symmetry from abdomen (Bilateral Breast)  Patient Location: PACU  Anesthesia Type:General  Level of Consciousness: drowsy and patient cooperative  Airway & Oxygen Therapy: Patient Spontanous Breathing and Patient connected to face mask oxygen  Post-op Assessment: Report given to RN and Post -op Vital signs reviewed and stable  Post vital signs: Reviewed and stable  Last Vitals:  Vitals Value Taken Time  BP 126/93 02/23/20 1024  Temp    Pulse 93 02/23/20 1025  Resp 10 02/23/20 1025  SpO2 100 % 02/23/20 1025  Vitals shown include unvalidated device data.  Last Pain:  Vitals:   02/23/20 0754  TempSrc: Oral  PainSc: 0-No pain      Patients Stated Pain Goal: 3 (16/10/96 0454)  Complications: No complications documented.

## 2020-02-23 NOTE — Anesthesia Procedure Notes (Signed)
Procedure Name: LMA Insertion Date/Time: 02/23/2020 9:06 AM Performed by: Kathryne Hitch, CRNA Pre-anesthesia Checklist: Patient identified, Emergency Drugs available, Suction available and Patient being monitored Patient Re-evaluated:Patient Re-evaluated prior to induction Oxygen Delivery Method: Circle system utilized Preoxygenation: Pre-oxygenation with 100% oxygen Induction Type: IV induction Ventilation: Mask ventilation without difficulty LMA: LMA inserted LMA Size: 4.0 Number of attempts: 1 Placement Confirmation: positive ETCO2 Tube secured with: Tape Dental Injury: Teeth and Oropharynx as per pre-operative assessment

## 2020-02-23 NOTE — Op Note (Signed)
DATE OF OPERATION: 02/23/2020  LOCATION: [facility]  PREOPERATIVE DIAGNOSIS: Breast asymmetry  POSTOPERATIVE DIAGNOSIS: Same  PROCEDURE: Lipofilling of bilateral breasts for improved symmetry  SURGEON: Tashala Cumbo Sanger Von Inscoe, DO  ASSISTANT: Bonita Cox, RNFA  EBL: 1 cc  CONDITION: Stable  COMPLICATIONS: None  INDICATION: The patient, Olivia [name], is a 60 y.o. female born on [birth year], is here for treatment of bilateral breast asymmetry after treatment for breast cancer.  She had expanders and then implants placed.  She has loss of volume in the medial aspect of both breasts as we see often.   PROCEDURE DETAILS:  The patient was seen prior to surgery and marked.  The IV antibiotics were given. The patient was taken to the operating room and given a general anesthetic. A standard time out was performed and all information was confirmed by those in the room. SCDs were placed.   The patient was prepped and draped.  Local was injected in her umbilical area and medial breasts on both sides for an area of 1 cm.  A #15 blade was used to make a 5 mm incision on the medial aspect of the breasts and the umbilical area.   The tumescent was infused into the abdomen.  After waiting for several minutes the liposuction was done and collected in the [facility].  The manufacture guidelines were used to clean the adipose cells.  The adipose was then placed in the medial aspect of both breasts to improve contour.  A total of 100 cc was placed on each side.  The incisions were closed with the 5-0 Monocryl.  The patient was allowed to wake up and taken to recovery room in stable condition at the end of the case. The family was notified at the end of the case.   The RNFA assisted throughout the case.  The RNFA was essential in retraction and counter traction when needed to make the case progress smoothly.  This retraction and assistance made it possible to see the tissue plans for the procedure.   The assistance was needed for blood control, tissue re-approximation and assisted with closure of the incision site.

## 2020-02-23 NOTE — Interval H&P Note (Signed)
History and Physical Interval Note:  02/23/2020 8:41 AM  Olivia [name]  has presented today for surgery, with the diagnosis of history of breast cancer.  The various methods of treatment have been discussed with the patient and family. After consideration of risks, benefits and other options for treatment, the patient has consented to  Procedure(s) with comments: Fat filling of breasts for symmetry (Bilateral) - 90 min, please as a surgical intervention.  The patient's history has been reviewed, patient examined, no change in status, stable for surgery.  I have reviewed the patient's chart and labs.  Questions were answered to the patient's satisfaction.     Loel Lofty Detrice Cales

## 2020-02-23 NOTE — Anesthesia Postprocedure Evaluation (Signed)
Anesthesia Post Note  Patient: Olivia [name]  Procedure(s) Performed: Fat filling of breasts for symmetry from abdomen (Bilateral Breast)     Patient location during evaluation: PACU Anesthesia Type: General Level of consciousness: awake and alert Pain management: pain level controlled Vital Signs Assessment: post-procedure vital signs reviewed and stable Respiratory status: spontaneous breathing, nonlabored ventilation, respiratory function stable and patient connected to nasal cannula oxygen Cardiovascular status: blood pressure returned to baseline and stable Postop Assessment: no apparent nausea or vomiting Anesthetic complications: no   No complications documented.  Last Vitals:  Vitals:   02/23/20 1059 02/23/20 1115  BP: 131/87 136/89  Pulse: 86 88  Resp: 19 16  Temp:  (!) 36.1 C  SpO2: 98% 99%    Last Pain:  Vitals:   02/23/20 1115  TempSrc:   PainSc: 0-No pain                 Tiajuana Amass

## 2020-02-23 NOTE — Anesthesia Preprocedure Evaluation (Addendum)
Anesthesia Evaluation  Patient identified by MRN, date of birth, ID band Patient awake    Reviewed: Allergy & Precautions, NPO status , Patient's Chart, lab work & pertinent test results  Airway Mallampati: II  TM Distance: >3 FB Neck ROM: Full    Dental  (+) Dental Advisory Given   Pulmonary neg pulmonary ROS,    breath sounds clear to auscultation       Cardiovascular hypertension,  Rhythm:Regular Rate:Normal     Neuro/Psych  Neuromuscular disease    GI/Hepatic Neg liver ROS, GERD  ,  Endo/Other  Hypothyroidism   Renal/GU negative Renal ROS     Musculoskeletal  (+) Arthritis ,   Abdominal   Peds  Hematology negative hematology ROS (+)   Anesthesia Other Findings   Reproductive/Obstetrics                             Anesthesia Physical Anesthesia Plan  ASA: II  Anesthesia Plan: General   Post-op Pain Management:    Induction: Intravenous  PONV Risk Score and Plan: 3 and Ondansetron, Dexamethasone, Midazolam and Treatment may vary due to age or medical condition  Airway Management Planned: LMA and Oral ETT  Additional Equipment:   Intra-op Plan:   Post-operative Plan: Extubation in OR  Informed Consent: I have reviewed the patients History and Physical, chart, labs and discussed the procedure including the risks, benefits and alternatives for the proposed anesthesia with the patient or authorized representative who has indicated his/her understanding and acceptance.     Dental advisory given  Plan Discussed with: CRNA  Anesthesia Plan Comments:         Anesthesia Quick Evaluation

## 2020-02-24 ENCOUNTER — Encounter (HOSPITAL_BASED_OUTPATIENT_CLINIC_OR_DEPARTMENT_OTHER): Payer: Self-pay | Admitting: Plastic Surgery

## 2020-02-24 DIAGNOSIS — N651 Disproportion of reconstructed breast: Secondary | ICD-10-CM | POA: Diagnosis not present

## 2020-02-28 NOTE — Progress Notes (Signed)
NIPPLE AREOLAR TATTOO PROCEDURE  PREOPERATIVE DIAGNOSIS:  Acquired absence of (BILATERAL) nipple areolar   POSTOPERATIVE DIAGNOSIS: Acquired absence of (BILATERAL) nipple areolar    PROCEDURES: (BILATERAL) nipple areolar tattoo   ATTENDING SURGEON: Dr. Lyndee Leo Sanger   ANESTHESIA:  EMLA  COMPLICATIONS: None.  JUSTIFICATION FOR PROCEDURE:  Olivia [name] is a 60 y.o. female with a history of breast cancer status post bilateral breast reconstruction. The patient presents for bilateral nipple areolar complex tattoo touch-up.  Risks, benefits, indications, and alternatives of the above described procedures were discussed with the patient and all the patient's questions were answered.   DESCRIPTION OF PROCEDURE: After informed consent was obtained and proper identification of patient and surgical site was made, the patient was taken to the procedure room and pre-procedure photos were taken & entered into chart. Pt was then placed supine on the operating room table. A time out was performed to confirm patient's identity and surgical site. The patient was prepped and draped in the usual sterile fashion.  Using a #7 tattoo head, pigment was instilled to the designed nipple areolar complex.  Once adequate pigment had been applied to the nipple areolar complex, a post-procedure photo was taken. vaseline/ xeroform & 4x4 gauze dressing was applied.  The patient tolerated the procedure well.   World Famous Tattoo Ink used: Fair Peach, Warm Honey, Fair Honey, Equinox, & [employer] Skin Duration used as needed for pain Lot #'s & expiration dates on file.

## 2020-02-28 NOTE — Patient Instructions (Signed)
Pt will use vaseline/xeroform & gauze dressing for approx 10 days. Keep are moisturized and no harsh scrubs She will f/u in 6 weeks for touch-up as needed. She will call for any concerns.

## 2020-03-06 ENCOUNTER — Other Ambulatory Visit: Payer: Self-pay

## 2020-03-06 ENCOUNTER — Encounter: Payer: Self-pay | Admitting: Plastic Surgery

## 2020-03-06 ENCOUNTER — Ambulatory Visit (INDEPENDENT_AMBULATORY_CARE_PROVIDER_SITE_OTHER): Payer: BC Managed Care – PPO | Admitting: Plastic Surgery

## 2020-03-06 VITALS — BP 144/97 | HR 71 | Temp 97.7°F | Ht 64.0 in | Wt 154.0 lb

## 2020-03-06 DIAGNOSIS — Z9013 Acquired absence of bilateral breasts and nipples: Secondary | ICD-10-CM

## 2020-03-06 NOTE — Progress Notes (Signed)
The patient is a 60 year old female here for follow-up after undergoing lipophilic of her breast.  She is very pleased with the improvement.  She is got a lot of bruising on her left breast area.  She has a little bit of bruising on her abdomen as well.  There is no redness, cellulitis or sign of infection.  She is well underway for her nipple areola tattooing and is pleased with her results so far.  I would like to see her back in a month.

## 2020-03-22 ENCOUNTER — Encounter: Payer: BC Managed Care – PPO | Admitting: Plastic Surgery

## 2020-03-23 ENCOUNTER — Encounter: Payer: BC Managed Care – PPO | Admitting: Plastic Surgery

## 2020-03-26 ENCOUNTER — Ambulatory Visit (INDEPENDENT_AMBULATORY_CARE_PROVIDER_SITE_OTHER): Payer: BC Managed Care – PPO

## 2020-03-26 ENCOUNTER — Other Ambulatory Visit: Payer: Self-pay

## 2020-03-26 VITALS — BP 137/80 | HR 75 | Temp 97.9°F | Ht 64.0 in | Wt 150.0 lb

## 2020-03-26 DIAGNOSIS — Z719 Counseling, unspecified: Secondary | ICD-10-CM

## 2020-03-26 DIAGNOSIS — Z9013 Acquired absence of bilateral breasts and nipples: Secondary | ICD-10-CM

## 2020-03-27 NOTE — Patient Instructions (Signed)
Pt will use vaseline/xeroform & gauze dressing for approx 1 week She will call for any concerns. F/U in 6 weeks if needed.

## 2020-03-27 NOTE — Progress Notes (Unsigned)
NIPPLE AREOLAR TATTOO PROCEDURE  PREOPERATIVE DIAGNOSIS:  Acquired absence of (BILATERAL) nipple areolar   POSTOPERATIVE DIAGNOSIS: Acquired absence of (BILATERAL) nipple areolar    PROCEDURES: (BILATERAL) nipple areolar tattoo touch-up   ATTENDING SURGEON: Dr. Floreen Comber   ANESTHESIA:  EMLA  COMPLICATIONS: None.  JUSTIFICATION FOR PROCEDURE:  [name] is a 60 y.o. female with a history of breast cancer status post bilateral breast reconstruction. The patient presents for bilateral nipple areolar complex tattoo touch-up.  Risks, benefits, indications, and alternatives of the above described procedures were discussed with the patient and all the patient's questions were answered.   DESCRIPTION OF PROCEDURE: After informed consent was obtained and proper identification of patient and surgical site was made, the patient was taken to the procedure room and pre-procedure photos taken.  Patient was then placed supine on the operating room table. A time out was performed to confirm patient's identity and surgical site. The patient was prepped and draped in the usual sterile fashion.   Using a # 7  tattoo head, pigment was instilled to the designed nipple areolar complexes.  Once adequate pigment had been applied  A post-procedure photo was taken. vaseline/ xeroform & gauze dressing was applied. The patient tolerated the procedure well.   Ink used: World Famous Tattoo Ink: Fair Peach/Fair Honey/Equinox/[place] Skin Duration used for anesthesia Lot #'s & ex[p dates on file

## 2020-04-02 DIAGNOSIS — C50911 Malignant neoplasm of unspecified site of right female breast: Secondary | ICD-10-CM | POA: Diagnosis not present

## 2020-04-02 DIAGNOSIS — C50912 Malignant neoplasm of unspecified site of left female breast: Secondary | ICD-10-CM | POA: Diagnosis not present

## 2020-04-03 DIAGNOSIS — C50912 Malignant neoplasm of unspecified site of left female breast: Secondary | ICD-10-CM | POA: Diagnosis not present

## 2020-04-03 DIAGNOSIS — C50911 Malignant neoplasm of unspecified site of right female breast: Secondary | ICD-10-CM | POA: Diagnosis not present

## 2020-04-06 ENCOUNTER — Telehealth: Payer: Self-pay | Admitting: *Deleted

## 2020-04-06 NOTE — Telephone Encounter (Signed)
Received DME Standard Written Order from [facility] [place].  Requesting signature and return.    Order signed and faxed back to [facility].  Confirmation received and copy scanned into the chart.//AB/CMA

## 2020-04-13 ENCOUNTER — Ambulatory Visit (INDEPENDENT_AMBULATORY_CARE_PROVIDER_SITE_OTHER): Payer: Self-pay | Admitting: Plastic Surgery

## 2020-04-13 ENCOUNTER — Encounter: Payer: Self-pay | Admitting: Plastic Surgery

## 2020-04-13 ENCOUNTER — Other Ambulatory Visit: Payer: Self-pay

## 2020-04-13 VITALS — BP 133/80

## 2020-04-13 DIAGNOSIS — Z719 Counseling, unspecified: Secondary | ICD-10-CM

## 2020-04-13 NOTE — Progress Notes (Signed)
Preoperative Dx: Hyperpigmentation of chest  Postoperative Dx:  same  Procedure: laser to neck and chest  Anesthesia: none  Description of Procedure:  Risks and complications were explained to the patient. Consent was confirmed and signed. Time out was called and all information was confirmed to be correct. The area  area was prepped with alcohol and wiped dry. The IPL laser was set at 7.6 J/cm2. The neck and chest was lasered. The patient tolerated the procedure well and there were no complications. The patient is to follow up in 8 weeks.

## 2020-05-01 ENCOUNTER — Other Ambulatory Visit: Payer: Self-pay | Admitting: Nurse Practitioner

## 2020-05-04 NOTE — Progress Notes (Signed)
[place]   [phone] Fax:([phone]) [phone]   Clinic Follow up Note   Patient Care Team: Olivia Hillock, DO as PCP - General (Family Medicine) Olivia Merle, MD as Consulting Physician (Hematology) Olivia Kussmaul, MD as Consulting Physician (General Surgery) Olivia Pray, MD as Consulting Physician (Radiation Oncology) Olivia Feil, MD as Consulting Physician (Gastroenterology) Olivia Posey, MD as Consulting Physician (Obstetrics and Gynecology) Olivia Phlegm, Olivia as Nurse Practitioner (Hematology and Oncology) Olivia Snare, MD as Consulting Physician (Endocrinology)  Date of Service:  05/07/2020  CHIEF COMPLAINT: F/u of right breast cancer  SUMMARY OF ONCOLOGIC HISTORY: Oncology History Overview Note  Cancer Staging Malignant neoplasm of upper-inner quadrant of right breast in female, estrogen receptor positive ([place]) Staging form: Breast, AJCC 8th Edition - Clinical stage from 10/09/2016: Stage IA (cT1c, cN0, cM0, G3, ER: Positive, PR: Positive, HER2: Negative) - Signed by Olivia Merle, MD on 10/14/2016 - Pathologic stage from 11/17/2016: Stage IB (pT2, pN0, cM0, G3, ER: Positive, PR: Positive, HER2: Negative, Oncotype DX score: 21) - Signed by Olivia Merle, MD on 12/11/2016     Malignant neoplasm of upper-inner quadrant of right breast in female, estrogen receptor positive ([place])  10/08/2016 Mammogram   Diagnostic mammogram and ultrasound showed an irregular mass in the right breast at 1 o'clock, 5 cm from the nipple measuring 1.6 x 1.4x 1.6 cm, axilla was negative for adenopathy.   10/09/2016 Receptors her2   Estrogen Receptor: 80%, POSITIVE, STRONG STAINING INTENSITY Progesterone Receptor: 30%, POSITIVE, STRONG STAINING INTENSITY Proliferation Marker Ki67: 35% HER2 NEGATIVE    10/09/2016 Initial Diagnosis   Malignant neoplasm of upper-inner quadrant of right breast in female, estrogen receptor positive ([place])   10/09/2016 Initial Biopsy    Diagnosis Breast, right, needle core biopsy, upper inner quadrant, 1:00 o'clock - INVASIVE DUCTAL CARCINOMA, G3   10/2016 Genetic Testing   Genetic testing was negative for The genes analyzed were the 23 genes on [facility]'s Breast/GYN panel (ATM, BARD1, BRCA1, BRCA2, BRIP1, CDH1, CHEK2, DICER1, EPCAM, MLH1,  MSH2, MSH6, NBN, NF1, PALB2, PMS2, PTEN, RAD50, RAD51C, RAD51D,SMARCA4, STK11, and TP53).     11/17/2016 Surgery   RIGHT MASTECTOMY AND LEFT PROPHYLATIC MASTECTOMY by Dr. Marlou [name]  IMEDITATE BREAST RECONSTRUCTION WITH PLACEMENT OF TISSUE EXPANDER AND FLEX HD (ACELLULAR HYDRATED DERMIS) by Dr. Marla [name]     11/17/2016 Pathology Results   Diagnosis 11/17/16 1. Breast, simple mastectomy, Left - BENIGN BREAST TISSUE WITH ECTATIC DUCTS. - ONE OF ONE LYMPH NODE NEGATIVE FOR CARCINOMA (0/1). 2. Breast, simple mastectomy, Right - INVASIVE DUCTAL CARCINOMA, GRADE 3, SPANNING 2.4 CM. - HIGH GRADE DUCTAL CARCINOMA IN SITU. - INVASIVE CARCINOMA IS <0.1 CM OF THE POSTERIOR MARGIN (BROADLY) AND ANTERIOR MARGIN (FOCALLY). - IN SITU CARCINOMA IS >0.2 CM OF BOTH MARGINS. - PRIOR LUMPECTOMY SITE, NEGATIVE FOR CARCINOMA. - SEE ONCOLOGY TABLE. 3. Breast, excision, Superior flap deep margin - BENIGN FIBROADIPOSE TISSUE.   11/17/2016 Miscellaneous   Oncotype on 11/17/16 Rcurence Risk Score of 21 10-Year risk of distant reucrrence with Tamoxifen alone is 13%    12/18/2016 -  Anti-estrogen oral therapy   Adjuvant Letrozole 2.5 mg daily; began 12/18/16. Due to arthritis I switched her to Exemestane 75m daily in 11/2019.    12/24/2016 Imaging   BONE SCAN IMPRESSION: No evidence of osseous metastatic disease.   12/24/2016 Imaging   CT CAP IMPRESSION: 1. Status post bilateral modified radical mastectomy and breast reconstruction, as above. No findings to suggest metastatic disease in the chest, abdomen  or pelvis. 2. Multiple heterogeneous appearing thyroid nodules, as discussed above. These are  nonspecific but further evaluation with nonemergent thyroid ultrasound is recommended in the near future to determine any potential need for further evaluation with fine-needle aspiration. 3. Incidental findings, as above.   02/11/2017 Surgery   REMOVAL OF BILATERAL TISSUE EXPANDERS WITH PLACEMENT OF BILATERAL SILICONE BREAST IMPLANTS by Dr. Marla [name]       CURRENT THERAPY:  1. Adjuvant Letrozole2.5 mg daily; began11/15/18. Due to arthritis I switched her to Exemestane 52m daily in 11/2019.  2. Prolia injections every 6 months since 2016 through her PCP  INTERVAL HISTORY:  Olivia PETERKAis here for a follow up of right breast cancer. She was last seen by me 6 months ago and seen by Olivia [name] 3 months ago. She presents to the clinic alone. She notes joint pain in fingers, knees and hips. She is able to tolerate this. She notes she is doing well. She note she had fat transfer surgery on 02/23/20 which was not successful. She was told she can do it 2-3 more times. She notes she did have nipple tattooing at Dr DMarla Roeoffice. She will think about it. I reviewed her medication list. She is tolerating Letrozole well. She only has minimal hot flashes.  She notes recent gut issues because she bloats often. She has BM every other day but has to strain and will have pencil shaped stool at times. She notes this has been going on for 1 year. She notes her last colonoscopy was in 2014 with Dr PSharlett Ileswhich was completely negative.    REVIEW OF SYSTEMS:   Constitutional: Denies fevers, chills or abnormal weight loss Eyes: Denies blurriness of vision Ears, nose, mouth, throat, and face: Denies mucositis or sore throat Respiratory: Denies cough, dyspnea or wheezes Cardiovascular: Denies palpitation, chest discomfort or lower extremity swelling Gastrointestinal:  Denies nausea, heartburn (+) Constipation, indigestion, bloating Skin: Denies abnormal skin rashes MSK: (+) joint pain  Lymphatics:  Denies new lymphadenopathy or easy bruising Neurological:Denies numbness, tingling or new weaknesses Behavioral/Psych: Mood is stable, no new changes  All other systems were reviewed with the patient and are negative.  MEDICAL HISTORY:  Past Medical History:  Diagnosis Date  . Adjustment insomnia 02/23/2019  . Breast cancer [place]   Lumpectomy, chemotherapy and radiation with recurrence 2018. Now on Femara  . COVID-19 02/2019   Her husband also passed away at this time from COVID-19.  . Family history of breast cancer   . Genetic testing 10/30/2016   Breast/GYN panel (23 genes) @ [facility] - No pathogenic mutations detected  . GERD (gastroesophageal reflux disease)    OTC medication  . Grief reaction 02/23/2019  . Heart murmur    as a child - Echo in early 20s "ok"  . History of echocardiogram 05/2015   Echo 4/17: EF 55-60%, GLS -19.6%, normal wall motion  . History of exercise stress test 05/2015   ETT 4/17 - No ST changes  . HLD (hyperlipidemia)   . Multiple thyroid nodules 2009   Multiple nodules: Normal ultrasound reported at diagnosis  . Osteoporosis   . Personal history of chemotherapy   . Personal history of radiation therapy     SURGICAL HISTORY: Past Surgical History:  Procedure Laterality Date  . ABDOMINAL HYSTERECTOMY    . BREAST LUMPECTOMY Right 2003  . BREAST RECONSTRUCTION WITH PLACEMENT OF TISSUE EXPANDER AND FLEX HD (ACELLULAR HYDRATED DERMIS) Bilateral 11/17/2016   Procedure: IMEDITATE BREAST RECONSTRUCTION WITH PLACEMENT  OF TISSUE EXPANDER AND FLEX HD (ACELLULAR HYDRATED DERMIS);  Surgeon: Wallace Going, DO;  Location: [place];  Service: Plastics;  Laterality: Bilateral;  . BREAST SURGERY  2003 /right   lumpectomy; chemotherapy and radiation  . LIPOSUCTION WITH LIPOFILLING Bilateral 02/23/2020   Procedure: Fat filling of breasts for symmetry from abdomen;  Surgeon: Wallace Going, DO;  Location: [place];  Service: Plastics;   Laterality: Bilateral;  90 min, please  . REMOVAL OF BILATERAL TISSUE EXPANDERS WITH PLACEMENT OF BILATERAL BREAST IMPLANTS Bilateral 02/11/2017   Procedure: REMOVAL OF BILATERAL TISSUE EXPANDERS WITH PLACEMENT OF BILATERAL SILICONE BREAST IMPLANTS;  Surgeon: Wallace Going, DO;  Location: [place];  Service: Plastics;  Laterality: Bilateral;  . TOTAL ABDOMINAL HYSTERECTOMY W/ BILATERAL SALPINGOOPHORECTOMY     for treatment of breast treatment  . TOTAL MASTECTOMY Bilateral 11/17/2016   Procedure: RIGHT MASTECTOMY AND LEFT PROPHYLATIC MASTECTOMY;  Surgeon: Olivia Kussmaul, MD;  Location: [place];  Service: General;  Laterality: Bilateral;    I have reviewed the social history and family history with the patient and they are unchanged from previous note.  ALLERGIES:  has No Known Allergies.  MEDICATIONS:  Current Outpatient Medications  Medication Sig Dispense Refill  . atorvastatin (LIPITOR) 20 MG tablet Take 1 tablet (20 mg total) by mouth daily. 45 tablet 3  . Calcium 500 MG CHEW Chew by mouth.    . cetirizine (ZYRTEC) 10 MG tablet Take 10 mg by mouth as needed.    . Collagenase POWD by Does not apply route.    Marland Kitchen denosumab (PROLIA) 60 MG/ML SOLN injection Inject 60 mg into the skin every 6 (six) months. Administer in upper arm, thigh, or abdomen 1 Syringe 1  . diclofenac (VOLTAREN) 75 MG EC tablet Take 1 tablet (75 mg total) by mouth 2 (two) times daily. 60 tablet 5  . ELDERBERRY PO Take by mouth.    Marland Kitchen exemestane (AROMASIN) 25 MG tablet TAKE 1 TABLET (25 MG TOTAL) BY MOUTH DAILY AFTER BREAKFAST. 90 tablet 1  . psyllium (METAMUCIL) 58.6 % powder Take 1 packet by mouth daily.    . Vitamin D, Cholecalciferol, 25 MCG (1000 UT) CAPS Take 1 capsule by mouth daily.     No current facility-administered medications for this visit.    PHYSICAL EXAMINATION: ECOG PERFORMANCE STATUS: 0 - Asymptomatic  Vitals:   05/07/20 0926  BP: (!) 139/95  Pulse: 79  Resp: 20  Temp: 98  F (36.7 C)  SpO2: 97%   Filed Weights   05/07/20 0926  Weight: 159 lb 4.8 oz (72.3 kg)    GENERAL:alert, no distress and comfortable SKIN: skin color, texture, turgor are normal, no rashes or significant lesions EYES: normal, Conjunctiva are pink and non-injected, sclera clear  NECK: supple, thyroid normal size, non-tender, without nodularity LYMPH:  no palpable lymphadenopathy in the cervical, axillary  LUNGS: clear to auscultation and percussion with normal breathing effort HEART: regular rate & rhythm and no murmurs and no lower extremity edema ABDOMEN:abdomen soft, non-tender and normal bowel sounds Musculoskeletal:no cyanosis of digits and no clubbing  NEURO: alert & oriented x 3 with fluent speech, no focal motor/sensory deficits BREAST: S/p B/l mastectomy and reconstruction. Surgical incisions healed well. No palpable mass, nodules or adenopathy bilaterally. Breast exam benign.   LABORATORY DATA:  I have reviewed the data as listed CBC Latest Ref Rng & Units 05/07/2020 11/04/2019 05/10/2019  WBC 4.0 - 10.5 K/uL 5.7 5.2 3.6(L)  Hemoglobin 12.0 -  15.0 g/dL 14.8 13.8 14.3  Hematocrit 36.0 - 46.0 % 44.6 41.6 42.0  Platelets 150 - 400 K/uL 254 259 223.0     CMP Latest Ref Rng & Units 05/07/2020 11/04/2019 05/10/2019  Glucose 70 - 99 mg/dL 105(H) 98 88  BUN 6 - 20 mg/dL 21(H) 23(H) 17  Creatinine 0.44 - 1.00 mg/dL 0.99 0.90 0.87  Sodium 135 - 145 mmol/L 143 139 141  Potassium 3.5 - 5.1 mmol/L 4.2 4.3 4.1  Chloride 98 - 111 mmol/L 107 106 105  CO2 22 - 32 mmol/L _0 Calcium 8.9 - 10.3 mg/dL 9.3 9.7 9.5  Total Protein 6.5 - 8.1 g/dL 7.3 7.2 6.8  Total Bilirubin 0.3 - 1.2 mg/dL 0.5 1.0 0.6  Alkaline Phos 38 - 126 U/L 60 55 41  AST 15 - 41 U/L 29 33 25  ALT 0 - 44 U/L 38 49(H) 28      RADIOGRAPHIC STUDIES: I have personally reviewed the radiological images as listed and agreed with the findings in the report. No results found.   ASSESSMENT & PLAN:  Olivia [name] is a  60 y.o. female with    1. Malignant neoplasm of upper-inner quadrant of right breast, invasive ductal carcinoma, pT2N0M0, Stage 1B, ER/PR: positive, HER2 negative, Grade 3, Oncotype RS 21 -She was diagnosed in 10/2016. She is s/p b/l mastectomy and reconstruction.She did not need chemo based on Oncotype RS.She hadnode-negativemastectomy, therefore will not require adjuvant radiation -Had b/l mastectomy and does not need any mammograms. S/p plastic surgery on 02/11/2017, b/l fat transfer on 02/23/20. and nipple tattooing with Dr Olivia [name].  -She started Letrozole on 12/18/16. Due to arthritis I switched her to Exemestane 22m daily in 11/2019 due to arthralgia. Tolerating better. Plan for 7 years treatment.   -She is clinically doing well. Lab reviewed, her CBC and CMP are within normal limits except BG 105, BUN 21. Her physical exam were unremarkable. There is no clinical concern for recurrence. -Continue surveillance and Exemestane  -F/u in 6 months   2. History of Right Breast Cancer, pT2N1M0, ER+/pr+/her2-, in 2003, Genetictestingwas negative -Treated by Dr. PEston Esters-s/p lumpectomy and axillary lymph node dissection (3/7 positive lymph nodes), chemo, radiation, tamoxifen for 2 years and Anastrozole for 3 years -s/p Total hysterectomy/BSO in 2006   3. Osteopenia  -She was previously on Fosamax for 5 years. Now on Prolia injections every 6 monthsthrough her PCP.  -01/2017 DEXA showed significant improvement from osteoporosis to osteopenia (T-Score of -2). Her 07/2019 DEXA improved with T-score -1.8 at right femur neck.  -ContinueProlia, calcium, vitamin D and exercise.   5. Thyroid Nodules -Seen on11/21/18 CT Chest. Continue to f/u withher PCP Dr. KRaoul Pitch  6. H/o COVID19 (+)02-20-19 Treated antibody injection and was able to recover well.  -Her [place] who had it before her [place]17   8. GI symptoms  -She notes for the past year, having  gut issues with bloating. She has BM every other day but has to strain and will have pencil shaped stool at times. She notes her last colonoscopy was in 2014 with Dr PSharlett Ileswhich was completely negative.  -I recommend she use Miralax to start to help her BM. I also recommend she f/u with Dr PSharlett Ilesfor repeat colonoscopy sooner.    PLAN: -Continue Exemestane  -Lab and F/u in 6 months  -she will call [facility] for follow up     No problem-specific Assessment & Plan notes  found for this encounter.   No orders of the defined types were placed in this encounter.  All questions were answered. The patient knows to call the clinic with any problems, questions or concerns. No barriers to learning was detected. The total time spent in the appointment was 30 minutes.     Olivia Merle, MD 05/07/2020   I, Joslyn Devon, am acting as scribe for Olivia Merle, MD.   I have reviewed the above documentation for accuracy and completeness, and I agree with the above.

## 2020-05-07 ENCOUNTER — Other Ambulatory Visit: Payer: Self-pay

## 2020-05-07 ENCOUNTER — Encounter: Payer: Self-pay | Admitting: Hematology

## 2020-05-07 ENCOUNTER — Telehealth: Payer: Self-pay | Admitting: Hematology

## 2020-05-07 ENCOUNTER — Inpatient Hospital Stay: Payer: BC Managed Care – PPO

## 2020-05-07 ENCOUNTER — Inpatient Hospital Stay: Payer: BC Managed Care – PPO | Attending: Hematology | Admitting: Hematology

## 2020-05-07 VITALS — BP 139/95 | HR 79 | Temp 98.0°F | Resp 20 | Ht 64.0 in | Wt 159.3 lb

## 2020-05-07 DIAGNOSIS — C50211 Malignant neoplasm of upper-inner quadrant of right female breast: Secondary | ICD-10-CM | POA: Diagnosis not present

## 2020-05-07 DIAGNOSIS — Z9011 Acquired absence of right breast and nipple: Secondary | ICD-10-CM | POA: Diagnosis not present

## 2020-05-07 DIAGNOSIS — Z170 Estrogen receptor positive status [ER+]: Secondary | ICD-10-CM | POA: Diagnosis not present

## 2020-05-07 DIAGNOSIS — Z923 Personal history of irradiation: Secondary | ICD-10-CM | POA: Insufficient documentation

## 2020-05-07 DIAGNOSIS — Z853 Personal history of malignant neoplasm of breast: Secondary | ICD-10-CM | POA: Insufficient documentation

## 2020-05-07 DIAGNOSIS — M8580 Other specified disorders of bone density and structure, unspecified site: Secondary | ICD-10-CM | POA: Diagnosis not present

## 2020-05-07 DIAGNOSIS — Z79811 Long term (current) use of aromatase inhibitors: Secondary | ICD-10-CM | POA: Insufficient documentation

## 2020-05-07 DIAGNOSIS — Z79899 Other long term (current) drug therapy: Secondary | ICD-10-CM | POA: Diagnosis not present

## 2020-05-07 DIAGNOSIS — Z90722 Acquired absence of ovaries, bilateral: Secondary | ICD-10-CM | POA: Insufficient documentation

## 2020-05-07 DIAGNOSIS — Z8616 Personal history of COVID-19: Secondary | ICD-10-CM | POA: Diagnosis not present

## 2020-05-07 LAB — CBC WITH DIFFERENTIAL/PLATELET
Abs Immature Granulocytes: 0.01 10*3/uL (ref 0.00–0.07)
Basophils Absolute: 0 10*3/uL (ref 0.0–0.1)
Basophils Relative: 0 %
Eosinophils Absolute: 0.1 10*3/uL (ref 0.0–0.5)
Eosinophils Relative: 1 %
HCT: 44.6 % (ref 36.0–46.0)
Hemoglobin: 14.8 g/dL (ref 12.0–15.0)
Immature Granulocytes: 0 %
Lymphocytes Relative: 27 %
Lymphs Abs: 1.5 10*3/uL (ref 0.7–4.0)
MCH: 29.2 pg (ref 26.0–34.0)
MCHC: 33.2 g/dL (ref 30.0–36.0)
MCV: 88.1 fL (ref 80.0–100.0)
Monocytes Absolute: 0.4 10*3/uL (ref 0.1–1.0)
Monocytes Relative: 7 %
Neutro Abs: 3.7 10*3/uL (ref 1.7–7.7)
Neutrophils Relative %: 65 %
Platelets: 254 10*3/uL (ref 150–400)
RBC: 5.06 MIL/uL (ref 3.87–5.11)
RDW: 13 % (ref 11.5–15.5)
WBC: 5.7 10*3/uL (ref 4.0–10.5)
nRBC: 0 % (ref 0.0–0.2)

## 2020-05-07 LAB — CMP (CANCER CENTER ONLY)
ALT: 38 U/L (ref 0–44)
AST: 29 U/L (ref 15–41)
Albumin: 4.6 g/dL (ref 3.5–5.0)
Alkaline Phosphatase: 60 U/L (ref 38–126)
Anion gap: 13 (ref 5–15)
BUN: 21 mg/dL — ABNORMAL HIGH (ref 6–20)
CO2: 23 mmol/L (ref 22–32)
Calcium: 9.3 mg/dL (ref 8.9–10.3)
Chloride: 107 mmol/L (ref 98–111)
Creatinine: 0.99 mg/dL (ref 0.44–1.00)
GFR, Estimated: >60 mL/min (ref >60)
Glucose, Bld: 105 mg/dL — ABNORMAL HIGH (ref 70–99)
Potassium: 4.2 mmol/L (ref 3.5–5.1)
Sodium: 143 mmol/L (ref 135–145)
Total Bilirubin: 0.5 mg/dL (ref 0.3–1.2)
Total Protein: 7.3 g/dL (ref 6.5–8.1)

## 2020-05-07 NOTE — Telephone Encounter (Signed)
Scheduled follow-up appointment per 4/4 los. Patient is aware. 

## 2020-05-14 ENCOUNTER — Other Ambulatory Visit: Payer: Self-pay

## 2020-05-14 ENCOUNTER — Ambulatory Visit (INDEPENDENT_AMBULATORY_CARE_PROVIDER_SITE_OTHER): Payer: BC Managed Care – PPO | Admitting: Family Medicine

## 2020-05-14 ENCOUNTER — Encounter: Payer: Self-pay | Admitting: Family Medicine

## 2020-05-14 VITALS — BP 115/79 | HR 73 | Temp 97.9°F | Ht 63.78 in | Wt 156.0 lb

## 2020-05-14 DIAGNOSIS — Z131 Encounter for screening for diabetes mellitus: Secondary | ICD-10-CM

## 2020-05-14 DIAGNOSIS — E782 Mixed hyperlipidemia: Secondary | ICD-10-CM

## 2020-05-14 DIAGNOSIS — E7800 Pure hypercholesterolemia, unspecified: Secondary | ICD-10-CM

## 2020-05-14 DIAGNOSIS — Z0000 Encounter for general adult medical examination without abnormal findings: Secondary | ICD-10-CM | POA: Diagnosis not present

## 2020-05-14 DIAGNOSIS — M810 Age-related osteoporosis without current pathological fracture: Secondary | ICD-10-CM

## 2020-05-14 DIAGNOSIS — E039 Hypothyroidism, unspecified: Secondary | ICD-10-CM | POA: Diagnosis not present

## 2020-05-14 DIAGNOSIS — C50211 Malignant neoplasm of upper-inner quadrant of right female breast: Secondary | ICD-10-CM

## 2020-05-14 DIAGNOSIS — E042 Nontoxic multinodular goiter: Secondary | ICD-10-CM

## 2020-05-14 DIAGNOSIS — M1990 Unspecified osteoarthritis, unspecified site: Secondary | ICD-10-CM

## 2020-05-14 DIAGNOSIS — Z170 Estrogen receptor positive status [ER+]: Secondary | ICD-10-CM

## 2020-05-14 LAB — VITAMIN D 25 HYDROXY (VIT D DEFICIENCY, FRACTURES): VITD: 39.3 ng/mL (ref 30.00–100.00)

## 2020-05-14 LAB — LIPID PANEL
Cholesterol: 159 mg/dL (ref 0–200)
HDL: 49.6 mg/dL (ref >39.00)
LDL Cholesterol: 76 mg/dL (ref 0–99)
NonHDL: 109.78
Total CHOL/HDL Ratio: 3
Triglycerides: 168 mg/dL — ABNORMAL HIGH (ref 0.0–149.0)
VLDL: 33.6 mg/dL (ref 0.0–40.0)

## 2020-05-14 LAB — HEMOGLOBIN A1C: Hgb A1c MFr Bld: 6 % (ref 4.6–6.5)

## 2020-05-14 LAB — TSH: TSH: 2.97 u[IU]/mL (ref 0.35–4.50)

## 2020-05-14 MED ORDER — ATORVASTATIN CALCIUM 20 MG PO TABS: 20.0000 mg | ORAL_TABLET | Freq: Every day | ORAL | 3 refills | Status: DC

## 2020-05-14 MED ORDER — DENOSUMAB 60 MG/ML ~~LOC~~ SOSY
60.0000 mg | PREFILLED_SYRINGE | Freq: Once | SUBCUTANEOUS | Status: AC
  Administered 2020-05-14: 60 mg via SUBCUTANEOUS

## 2020-05-14 MED ORDER — DICLOFENAC SODIUM 75 MG PO TBEC: 75.0000 mg | DELAYED_RELEASE_TABLET | Freq: Two times a day (BID) | ORAL | 1 refills | Status: DC

## 2020-05-14 NOTE — Progress Notes (Signed)
This visit occurred during the SARS-CoV-2 public health emergency.  Safety protocols were in place, including screening questions prior to the visit, additional usage of staff PPE, and extensive cleaning of exam room while observing appropriate contact time as indicated for disinfecting solutions.    Patient ID: Olivia [name], female  DOB: 1960/12/27, 60 y.o.   MRN: [id number] Patient Care Team    Relationship Specialty Notifications Start End  Ma Hillock, DO PCP - General Family Medicine  12/26/14   Truitt Merle, MD Consulting Physician Hematology  10/15/16   Jovita Kussmaul, MD Consulting Physician General Surgery  10/15/16   Gery Pray, MD Consulting Physician Radiation Oncology  10/15/16   Sable Feil, MD Consulting Physician Gastroenterology  04/14/17   Molli Posey, MD Consulting Physician Obstetrics and Gynecology  04/14/17   Gardenia Phlegm, NP Nurse Practitioner Hematology and Oncology  07/08/17   Elayne Snare, MD Consulting Physician Endocrinology  04/22/18    Comment: thyroid nodules    Chief Complaint  Patient presents with  . Annual Exam    Pt is fasting     Subjective: Olivia [name] is a 60 y.o.  Female  present for CPE. All past medical history, surgical history, allergies, family history, immunizations, medications and social history were updated in the electronic medical record today. All recent labs, ED visits and hospitalizations within the last year were reviewed.  Health maintenance:  Colonoscopy: completedbyDr. Sharlett Iles January 2014. Ten-year follow-up. Mammogram:Double mastectomy08/28/2018, patient being treated for recurrent breast cancer. Cervical cancer screening: last EHU:DJSHFWY of hysterectomy, pelvic exams completed by Dr. Grayling Congress. Immunizations: tdapUTD 2013, InfluenzaUTD 2021(encouraged yearly). Shingrixseries completed. covid completed Infectious disease screening: HIVandHep C completed DEXA:History of  osteoporosis, on proliainjections every 6 months. Last DEXA scan 2021([facility]- ordered by speciliast team).Follow-up every 2 years Assistive device: none Oxygen OVZ:CHYI Patient has a Dental home. Hospitalizations/ED visits: reviewed  Arthritis:  She reports diclofenac1-2 x daily and it  is working well for her. Mobic had not been working well for   Depression screen [facility] 2/9 05/14/2020 05/10/2019 04/22/2018 04/14/2017 01/29/2012  Decreased Interest 0 0 0 0 0  Down, Depressed, Hopeless 0 0 0 0 0  PHQ - 2 Score 0 0 0 0 0  Altered sleeping - - 0 - -  Tired, decreased energy - - 0 - -  Change in appetite - - 0 - -  Feeling bad or failure about yourself  - - 0 - -  Trouble concentrating - - 0 - -  Moving slowly or fidgety/restless - - 0 - -  Suicidal thoughts - - 0 - -  PHQ-9 Score - - 0 - -  Difficult doing work/chores - - Not difficult at all - -  Some recent data might be hidden   GAD 7 : Generalized Anxiety Score 04/22/2018  Nervous, Anxious, on Edge 1  Control/stop worrying 1  Worry too much - different things 1  Trouble relaxing 0  Restless 0  Easily annoyed or irritable 1  Afraid - awful might happen 0  Total GAD 7 Score 4  Anxiety Difficulty Not difficult at all    Immunization History  Administered Date(s) Administered  . Influenza Split 11/26/2011  . Influenza,inj,Quad PF,6+ Mos 11/20/2013, 10/20/2018, 11/04/2019  . Influenza,inj,quad, With Preservative 11/18/2018  . Influenza-Unspecified 12/05/2014, 10/03/2015, 11/10/2016, 10/04/2017  . PFIZER(Purple Top)SARS-COV-2 Vaccination 05/12/2019, 06/06/2019, 12/07/2019  . Tdap 01/29/2012  . Zoster Recombinat (Shingrix) 06/03/2017, 08/18/2017    Past Medical History:  Diagnosis Date  .  Adjustment insomnia 02/23/2019  . Breast cancer ([place]) 2018   Lumpectomy, chemotherapy and radiation with recurrence 2018. Now on Femara  . Cervical radiculopathy 07/05/2019  . COVID-19 02/2019   Her husband also passed away at this time from  COVID-19.  . Family history of breast cancer   . Genetic testing 10/30/2016   Breast/GYN panel (23 genes) @ [facility] - No pathogenic mutations detected  . GERD (gastroesophageal reflux disease)    OTC medication  . Grief reaction 02/23/2019  . Heart murmur    as a child - Echo in early 20s "ok"  . History of echocardiogram 05/2015   Echo 4/17: EF 55-60%, GLS -19.6%, normal wall motion  . History of exercise stress test 05/2015   ETT 4/17 - No ST changes  . HLD (hyperlipidemia)   . Multiple thyroid nodules 2009   Multiple nodules: Normal ultrasound reported at diagnosis  . Osteoporosis   . Personal history of chemotherapy   . Personal history of radiation therapy   . UNEQUAL LEG LENGTH, ACQUIRED 11/17/2006   Qualifier: Diagnosis of  By: Oneida Alar MD, KARL     No Known Allergies Past Surgical History:  Procedure Laterality Date  . ABDOMINAL HYSTERECTOMY    . BREAST LUMPECTOMY Right 2003  . BREAST RECONSTRUCTION WITH PLACEMENT OF TISSUE EXPANDER AND FLEX HD (ACELLULAR HYDRATED DERMIS) Bilateral 11/17/2016   Procedure: IMEDITATE BREAST RECONSTRUCTION WITH PLACEMENT OF TISSUE EXPANDER AND FLEX HD (ACELLULAR HYDRATED DERMIS);  Surgeon: Wallace Going, DO;  Location: [facility];  Service: Plastics;  Laterality: Bilateral;  . BREAST SURGERY  2003 /right   lumpectomy; chemotherapy and radiation  . LIPOSUCTION WITH LIPOFILLING Bilateral 02/23/2020   Procedure: Fat filling of breasts for symmetry from abdomen;  Surgeon: Wallace Going, DO;  Location: [facility];  Service: Plastics;  Laterality: Bilateral;  90 min, please  . REMOVAL OF BILATERAL TISSUE EXPANDERS WITH PLACEMENT OF BILATERAL BREAST IMPLANTS Bilateral 02/11/2017   Procedure: REMOVAL OF BILATERAL TISSUE EXPANDERS WITH PLACEMENT OF BILATERAL SILICONE BREAST IMPLANTS;  Surgeon: Wallace Going, DO;  Location: [facility];  Service: Plastics;  Laterality: Bilateral;  . TOTAL ABDOMINAL HYSTERECTOMY W/  BILATERAL SALPINGOOPHORECTOMY     for treatment of breast treatment  . TOTAL MASTECTOMY Bilateral 11/17/2016   Procedure: RIGHT MASTECTOMY AND LEFT PROPHYLATIC MASTECTOMY;  Surgeon: Jovita Kussmaul, MD;  Location: [facility];  Service: General;  Laterality: Bilateral;   Family History  Problem Relation Age of Onset  . Diabetes Other        1ST DEGREE RELATIVE  . Coronary artery disease Mother        1ST DEGREE RELATIVE  . Heart attack Mother 59  . Skin cancer Mother   . Diabetes Father   . Kidney disease Father   . Prostate cancer Father 48       deceased 82  . Cancer Brother 76       multiple myeloma   . Lung cancer Maternal Uncle        in 2 mat uncles  . Breast cancer Paternal Aunt 23  . Breast cancer Cousin        dx 38s; daughter of unaffected paternal aunt  . Breast cancer Cousin        dx 39s; another daughter of unaffected paternal aunt  . Esophageal cancer Cousin        pat first cousin   Social History   Social History Narrative   Married. Bobby.  Children: none. No pets.    Stay at home.    Wears her seatbelt. Smoke alarm in the home.    Feels safe in her relationships.    Exercise 5x week.     Allergies as of 05/14/2020   No Known Allergies     Medication List       Accurate as of May 14, 2020  8:40 AM. If you have any questions, ask your nurse or doctor.        atorvastatin 20 MG tablet Commonly known as: LIPITOR Take 1 tablet (20 mg total) by mouth daily.   Calcium 500 MG Chew Chew by mouth.   cetirizine 10 MG tablet Commonly known as: ZYRTEC Take 10 mg by mouth as needed.   Collagenase Powd by Does not apply route.   denosumab 60 MG/ML Soln injection Commonly known as: PROLIA Inject 60 mg into the skin every 6 (six) months. Administer in upper arm, thigh, or abdomen   diclofenac 75 MG EC tablet Commonly known as: VOLTAREN Take 1 tablet (75 mg total) by mouth 2 (two) times daily.   ELDERBERRY PO Take by mouth.   exemestane 25 MG  tablet Commonly known as: AROMASIN TAKE 1 TABLET (25 MG TOTAL) BY MOUTH DAILY AFTER BREAKFAST.   Fish Oil 1200 MG Caps   psyllium 58.6 % powder Commonly known as: METAMUCIL Take 1 packet by mouth daily.   Vitamin D (Cholecalciferol) 25 MCG (1000 UT) Caps Take 1 capsule by mouth daily.       All past medical history, surgical history, allergies, family history, immunizations andmedications were updated in the EMR today and reviewed under the history and medication portions of their EMR.      No results found.   ROS: 14 pt review of systems performed and negative (unless mentioned in an HPI)  Objective: BP 115/79   Pulse 73   Temp 97.9 F (36.6 C) (Oral)   Ht 5' 3.78" (1.62 m)   Wt 156 lb (70.8 kg)   SpO2 96%   BMI 26.96 kg/m  Gen: Afebrile. No acute distress. Nontoxic in appearance, well-developed, well-nourished,  Pleasant female.  HENT: AT. [place]. Bilateral TM visualized and normal in appearance, normal external auditory canal. MMM, no oral lesions, adequate dentition. Bilateral nares within normal limits. Throat without erythema, ulcerations or exudates. no Cough on exam, no hoarseness on exam. Eyes:Pupils Equal Round Reactive to light, Extraocular movements intact,  Conjunctiva without redness, discharge or icterus. Neck/lymp/endocrine: Supple,no lymphadenopathy, RIGHT thyromegaly CV: RRR no murmur, no edema, +2/4 P posterior tibialis pulses.  Chest: CTAB, no wheeze, rhonchi or crackles. Normal Respiratory effort. good Air movement. Abd: Soft. falt. NTND. BS present. no Masses palpated. No hepatosplenomegaly. No rebound tenderness or guarding. Skin: no rashes, purpura or petechiae. Warm and well-perfused. Skin intact. Neuro/Msk:  Normal gait. PERLA. EOMi. Alert. Oriented x3.  Cranial nerves II through XII intact. Muscle strength 5/5 upper/lower extremity. DTRs equal bilaterally. Psych: Normal affect, dress and demeanor. Normal speech. Normal thought content and  judgment.   No exam data present  Assessment/plan: JILLANN CHARETTE is a 60 y.o. female present for CPE  Osteoprosis, unspecified osteoporosis type, unspecified pathological fracture presence Continue prolia q 6 months. > administered today Continue vit d supplement  Malignant neoplasm of upper-inner quadrant of right breast in female, estrogen receptor positive ([place]) Routinely follows with heme-onc and GYN- prescribed femara  Mixed hyperlipidemia/statin therapy continue Lipitor 20 QD Lipids collected today Reviewed recent cmp and cbc  Arthritis  stable  Continue  Voltaren BID   Multiple thyroid nodules/Hypothyroidism, unspecified type Enlarged thyroid on right . - TSH Rpt [place] due 01/2022. Has been following w/ Dr. Dwyane Dee  Diabetes mellitus screening - Hemoglobin A1c Mixed hyperlipidemia - atorvastatin (LIPITOR) 20 MG tablet; Take 1 tablet (20 mg total) by mouth daily.  Dispense: 90 tablet; Refill: 3 Encounter for preventive health examination Patient was encouraged to exercise greater than 150 minutes a week. Patient was encouraged to choose a diet filled with fresh fruits and vegetables, and lean meats. AVS provided to patient today for education/recommendation on gender specific health and safety maintenance. Colonoscopy: completedbyDr. Sharlett Iles January 2014. Ten-year follow-up. Mammogram:Double mastectomy08/28/2018, patient being treated for recurrent breast cancer. Cervical cancer screening: last JQZ:ESPQZRA of hysterectomy, pelvic exams completed by Dr. Grayling Congress. Immunizations: tdapUTD 2013, InfluenzaUTD 2021(encouraged yearly). Shingrixseries completed. covid completed Infectious disease screening: HIVandHep C completed DEXA:History of osteoporosis, on proliainjections every 6 months. Last DEXA scan 2021([facility]- ordered by speciliast team).Follow-up every 2 years   Return in about 1 year (around 05/14/2021) for CPE (30 min).   Orders Placed This  Encounter  Procedures  . Hemoglobin A1c  . Lipid panel  . TSH  . Vitamin D (25 hydroxy)   Meds ordered this encounter  Medications  . diclofenac (VOLTAREN) 75 MG EC tablet    Sig: Take 1 tablet (75 mg total) by mouth 2 (two) times daily.    Dispense:  90 tablet    Refill:  1  . atorvastatin (LIPITOR) 20 MG tablet    Sig: Take 1 tablet (20 mg total) by mouth daily.    Dispense:  90 tablet    Refill:  3   Referral Orders  No referral(s) requested today     Electronically signed by: Howard Pouch, [place]

## 2020-05-14 NOTE — Addendum Note (Signed)
Addended by: Octaviano Glow on: 05/14/2020 04:24 PM   Modules accepted: Orders

## 2020-05-14 NOTE — Patient Instructions (Signed)
Health Maintenance, Female Adopting a healthy lifestyle and getting preventive care are important in promoting health and wellness. Ask your health care provider about:  The right schedule for you to have regular tests and exams.  Things you can do on your own to prevent diseases and keep yourself healthy. What should I know about diet, weight, and exercise? Eat a healthy diet  Eat a diet that includes plenty of vegetables, fruits, low-fat dairy products, and lean protein.  Do not eat a lot of foods that are high in solid fats, added sugars, or sodium.   Maintain a healthy weight Body mass index (BMI) is used to identify weight problems. It estimates body fat based on height and weight. Your health care provider can help determine your BMI and help you achieve or maintain a healthy weight. Get regular exercise Get regular exercise. This is one of the most important things you can do for your health. Most adults should:  Exercise for at least 150 minutes each week. The exercise should increase your heart rate and make you sweat (moderate-intensity exercise).  Do strengthening exercises at least twice a week. This is in addition to the moderate-intensity exercise.  Spend less time sitting. Even light physical activity can be beneficial. Watch cholesterol and blood lipids Have your blood tested for lipids and cholesterol at 59 years of age, then have this test every 5 years. Have your cholesterol levels checked more often if:  Your lipid or cholesterol levels are high.  You are older than 59 years of age.  You are at high risk for heart disease. What should I know about cancer screening? Depending on your health history and family history, you may need to have cancer screening at various ages. This may include screening for:  Breast cancer.  Cervical cancer.  Colorectal cancer.  Skin cancer.  Lung cancer. What should I know about heart disease, diabetes, and high blood  pressure? Blood pressure and heart disease  High blood pressure causes heart disease and increases the risk of stroke. This is more likely to develop in people who have high blood pressure readings, are of African descent, or are overweight.  Have your blood pressure checked: ? Every 3-5 years if you are 18-39 years of age. ? Every year if you are 40 years old or older. Diabetes Have regular diabetes screenings. This checks your fasting blood sugar level. Have the screening done:  Once every three years after age 40 if you are at a normal weight and have a low risk for diabetes.  More often and at a younger age if you are overweight or have a high risk for diabetes. What should I know about preventing infection? Hepatitis B If you have a higher risk for hepatitis B, you should be screened for this virus. Talk with your health care provider to find out if you are at risk for hepatitis B infection. Hepatitis C Testing is recommended for:  Everyone born from 1945 through 1965.  Anyone with known risk factors for hepatitis C. Sexually transmitted infections (STIs)  Get screened for STIs, including gonorrhea and chlamydia, if: ? You are sexually active and are younger than 59 years of age. ? You are older than 59 years of age and your health care provider tells you that you are at risk for this type of infection. ? Your sexual activity has changed since you were last screened, and you are at increased risk for chlamydia or gonorrhea. Ask your health care provider   if you are at risk.  Ask your health care provider about whether you are at high risk for HIV. Your health care provider may recommend a prescription medicine to help prevent HIV infection. If you choose to take medicine to prevent HIV, you should first get tested for HIV. You should then be tested every 3 months for as long as you are taking the medicine. Pregnancy  If you are about to stop having your period (premenopausal) and  you may become pregnant, seek counseling before you get pregnant.  Take 400 to 800 micrograms (mcg) of folic acid every day if you become pregnant.  Ask for birth control (contraception) if you want to prevent pregnancy. Osteoporosis and menopause Osteoporosis is a disease in which the bones lose minerals and strength with aging. This can result in bone fractures. If you are 65 years old or older, or if you are at risk for osteoporosis and fractures, ask your health care provider if you should:  Be screened for bone loss.  Take a calcium or vitamin D supplement to lower your risk of fractures.  Be given hormone replacement therapy (HRT) to treat symptoms of menopause. Follow these instructions at home: Lifestyle  Do not use any products that contain nicotine or tobacco, such as cigarettes, e-cigarettes, and chewing tobacco. If you need help quitting, ask your health care provider.  Do not use street drugs.  Do not share needles.  Ask your health care provider for help if you need support or information about quitting drugs. Alcohol use  Do not drink alcohol if: ? Your health care provider tells you not to drink. ? You are pregnant, may be pregnant, or are planning to become pregnant.  If you drink alcohol: ? Limit how much you use to 0-1 drink a day. ? Limit intake if you are breastfeeding.  Be aware of how much alcohol is in your drink. In the [place] one drink equals one 12 oz bottle of beer (355 mL), one 5 oz glass of wine (148 mL), or one 1 oz glass of hard liquor (44 mL). General instructions  Schedule regular health, dental, and eye exams.  Stay current with your vaccines.  Tell your health care provider if: ? You often feel depressed. ? You have ever been abused or do not feel safe at home. Summary  Adopting a healthy lifestyle and getting preventive care are important in promoting health and wellness.  Follow your health care provider's instructions about healthy  diet, exercising, and getting tested or screened for diseases.  Follow your health care provider's instructions on monitoring your cholesterol and blood pressure. This information is not intended to replace advice given to you by your health care provider. Make sure you discuss any questions you have with your health care provider. Document Revised: 01/13/2018 Document Reviewed: 01/13/2018 Elsevier Patient Education  2021 Elsevier Inc.  

## 2020-07-03 ENCOUNTER — Encounter: Payer: Self-pay | Admitting: Plastic Surgery

## 2020-07-03 ENCOUNTER — Ambulatory Visit (INDEPENDENT_AMBULATORY_CARE_PROVIDER_SITE_OTHER): Payer: Self-pay | Admitting: Plastic Surgery

## 2020-07-03 ENCOUNTER — Other Ambulatory Visit: Payer: Self-pay

## 2020-07-03 DIAGNOSIS — Z719 Counseling, unspecified: Secondary | ICD-10-CM

## 2020-07-03 NOTE — Progress Notes (Signed)
Preoperative Dx: Hyperpigmentation of neck and chest  Postoperative Dx:  same  Procedure: laser to face   Anesthesia: none  Description of Procedure:  Risks and complications were explained to the patient. Consent was confirmed and signed. Time out was called and all information was confirmed to be correct. The area  area was prepped with alcohol and wiped dry. The BBL laser was set at 6 J/cm2. The neck and chest was lasered. The patient tolerated the procedure well and there were no complications. The patient is to follow up in 4 weeks.

## 2020-07-20 ENCOUNTER — Ambulatory Visit: Payer: BC Managed Care – PPO

## 2020-07-20 NOTE — Progress Notes (Signed)
   Covid-19 Vaccination Clinic  Name:  Olivia [name]    MRN: [id number] DOB: [birth year]  07/20/2020  [name] was observed post Covid-19 immunization for 15 minutes without incident. She was provided with Vaccine Information Sheet and instruction to access the V-Safe system.   [name] was instructed to call 911 with any severe reactions post vaccine: Difficulty breathing  Swelling of face and throat  A fast heartbeat  A bad rash all over body  Dizziness and weakness

## 2020-07-23 ENCOUNTER — Other Ambulatory Visit (HOSPITAL_BASED_OUTPATIENT_CLINIC_OR_DEPARTMENT_OTHER): Payer: Self-pay

## 2020-07-23 MED ORDER — COVID-19 MRNA VAC-TRIS(PFIZER) 30 MCG/0.3ML IM SUSP
INTRAMUSCULAR | 0 refills | Status: DC
  Filled 2020-07-23: qty 0.3, 1d supply, fill #0

## 2020-08-08 DIAGNOSIS — Z6827 Body mass index (BMI) 27.0-27.9, adult: Secondary | ICD-10-CM | POA: Diagnosis not present

## 2020-08-08 DIAGNOSIS — Z01419 Encounter for gynecological examination (general) (routine) without abnormal findings: Secondary | ICD-10-CM | POA: Diagnosis not present

## 2020-08-09 ENCOUNTER — Encounter: Payer: Self-pay | Admitting: Family Medicine

## 2020-08-10 ENCOUNTER — Encounter: Payer: Self-pay | Admitting: Family Medicine

## 2020-08-10 ENCOUNTER — Other Ambulatory Visit: Payer: Self-pay

## 2020-08-10 ENCOUNTER — Ambulatory Visit (INDEPENDENT_AMBULATORY_CARE_PROVIDER_SITE_OTHER): Payer: BC Managed Care – PPO | Admitting: Family Medicine

## 2020-08-10 VITALS — BP 126/83 | HR 59 | Temp 98.2°F | Ht 64.0 in | Wt 155.0 lb

## 2020-08-10 DIAGNOSIS — I10 Essential (primary) hypertension: Secondary | ICD-10-CM | POA: Insufficient documentation

## 2020-08-10 HISTORY — DX: Essential (primary) hypertension: I10

## 2020-08-10 MED ORDER — HYDROCHLOROTHIAZIDE 12.5 MG PO TABS: 12.5000 mg | ORAL_TABLET | Freq: Every day | ORAL | 1 refills | Status: DC

## 2020-08-10 NOTE — Patient Instructions (Signed)
Start HCTZ one tab daily in the morning for your blood pressure.   Low-Sodium Eating Plan Sodium, which is an element that makes up salt, helps you maintain a healthy balance of fluids in your body. Too much sodium can increase your bloodpressure and cause fluid and waste to be held in your body. Your health care provider or dietitian may recommend following this plan if you have high blood pressure (hypertension), kidney disease, liver disease, or heart failure. Eating less sodium can help lower your blood pressure, reduce swelling, and protect your heart, liver, andkidneys. What are tips for following this plan? Reading food labels The Nutrition Facts label lists the amount of sodium in one serving of the food. If you eat more than one serving, you must multiply the listed amount of sodium by the number of servings. Choose foods with less than 140 mg of sodium per serving. Avoid foods with 300 mg of sodium or more per serving. Shopping  Look for lower-sodium products, often labeled as "low-sodium" or "no salt added." Always check the sodium content, even if foods are labeled as "unsalted" or "no salt added." Buy fresh foods. Avoid canned foods and pre-made or frozen meals. Avoid canned, cured, or processed meats. Buy breads that have less than 80 mg of sodium per slice.  Cooking  Eat more home-cooked food and less restaurant, buffet, and fast food. Avoid adding salt when cooking. Use salt-free seasonings or herbs instead of table salt or sea salt. Check with your health care provider or pharmacist before using salt substitutes. Cook with plant-based oils, such as canola, sunflower, or olive oil.  Meal planning When eating at a restaurant, ask that your food be prepared with less salt or no salt, if possible. Avoid dishes labeled as brined, pickled, cured, smoked, or made with soy sauce, miso, or teriyaki sauce. Avoid foods that contain MSG (monosodium glutamate). MSG is sometimes added  to [place] food, bouillon, and some canned foods. Make meals that can be grilled, baked, poached, roasted, or steamed. These are generally made with less sodium. General information Most people on this plan should limit their sodium intake to 1,500-2,000 mg (milligrams) of sodium each day. What foods should I eat? Fruits Fresh, frozen, or canned fruit. Fruit juice. Vegetables Fresh or frozen vegetables. "No salt added" canned vegetables. "No salt added"tomato sauce and paste. Low-sodium or reduced-sodium tomato and vegetable juice. Grains Low-sodium cereals, including oats, puffed wheat and rice, and shredded wheat. Low-sodium crackers. Unsalted rice. Unsalted pasta. Low-sodium bread.Whole-grain breads and whole-grain pasta. Meats and other proteins Fresh or frozen (no salt added) meat, poultry, seafood, and fish. Low-sodium canned tuna and salmon. Unsalted nuts. Dried peas, beans, and lentils withoutadded salt. Unsalted canned beans. Eggs. Unsalted nut butters. Dairy Milk. Soy milk. Cheese that is naturally low in sodium, such as ricotta cheese, fresh mozzarella, or Swiss cheese. Low-sodium or reduced-sodium cheese. Creamcheese. Yogurt. Seasonings and condiments Fresh and dried herbs and spices. Salt-free seasonings. Low-sodium mustard and ketchup. Sodium-free salad dressing. Sodium-free light mayonnaise. Fresh orrefrigerated horseradish. Lemon juice. Vinegar. Other foods Homemade, reduced-sodium, or low-sodium soups. Unsalted popcorn and pretzels.Low-salt or salt-free chips. The items listed above may not be a complete list of foods and beverages you can eat. Contact a dietitian for more information. What foods should I avoid? Vegetables Sauerkraut, pickled vegetables, and relishes. Olives. [place] fries. Onion rings. Regular canned vegetables (not low-sodium or reduced-sodium). Regular canned tomato sauce and paste (not low-sodium or reduced-sodium). Regular tomato and vegetable juice (not  low-sodium or reduced-sodium). Frozenvegetables in sauces. Grains Instant hot cereals. Bread stuffing, pancake, and biscuit mixes. Croutons. Seasoned rice or pasta mixes. Noodle soup cups. Boxed or frozen macaroni andcheese. Regular salted crackers. Self-rising flour. Meats and other proteins Meat or fish that is salted, canned, smoked, spiced, or pickled. Precooked or cured meat, such as sausages or meat loaves. Berniece Salines. Ham. Pepperoni. Hot dogs. Corned beef. Chipped beef. Salt pork. Jerky. Pickled herring. Anchovies andsardines. Regular canned tuna. Salted nuts. Dairy Processed cheese and cheese spreads. Hard cheeses. Cheese curds. Blue cheese.Feta cheese. String cheese. Regular cottage cheese. Buttermilk. Canned milk. Fats and oils Salted butter. Regular margarine. Ghee. Bacon fat. Seasonings and condiments Onion salt, garlic salt, seasoned salt, table salt, and sea salt. Canned and packaged gravies. Worcestershire sauce. Tartar sauce. Barbecue sauce. Teriyaki sauce. Soy sauce, including reduced-sodium. Steak sauce. Fish sauce. Oyster sauce. Cocktail sauce. Horseradish that you find on the shelf. Regular ketchup and mustard. Meat flavorings and tenderizers. Bouillon cubes. Hot sauce. Pre-made or packaged marinades. Pre-made or packaged taco seasonings. Relishes.Regular salad dressings. Salsa. Other foods Salted popcorn and pretzels. Corn chips and puffs. Potato and tortilla chips.Canned or dried soups. Pizza. Frozen entrees and pot pies. The items listed above may not be a complete list of foods and beverages you should avoid. Contact a dietitian for more information. Summary Eating less sodium can help lower your blood pressure, reduce swelling, and protect your heart, liver, and kidneys. Most people on this plan should limit their sodium intake to 1,500-2,000 mg (milligrams) of sodium each day. Canned, boxed, and frozen foods are high in sodium. Restaurant foods, fast foods, and pizza are also  very high in sodium. You also get sodium by adding salt to food. Try to cook at home, eat more fresh fruits and vegetables, and eat less fast food and canned, processed, or prepared foods. This information is not intended to replace advice given to you by your health care provider. Make sure you discuss any questions you have with your healthcare provider. Document Revised: 02/25/2019 Document Reviewed: 12/22/2018 Elsevier Patient Education  2022 [employer].

## 2020-08-10 NOTE — Progress Notes (Signed)
This visit occurred during the SARS-CoV-2 public health emergency.  Safety protocols were in place, including screening questions prior to the visit, additional usage of staff PPE, and extensive cleaning of exam room while observing appropriate contact time as indicated for disinfecting solutions.    Olivia [name] , May 18, 1960, 60 y.o., female MRN: [id number] Patient Care Team    Relationship Specialty Notifications Start End  Ma Hillock, DO PCP - General Family Medicine  12/26/14   Truitt Merle, MD Consulting Physician Hematology  10/15/16   Jovita Kussmaul, MD Consulting Physician General Surgery  10/15/16   Gery Pray, MD Consulting Physician Radiation Oncology  10/15/16   Sable Feil, MD Consulting Physician Gastroenterology  04/14/17   Molli Posey, MD Consulting Physician Obstetrics and Gynecology  04/14/17   Gardenia Phlegm, NP Nurse Practitioner Hematology and Oncology  07/08/17   Elayne Snare, MD Consulting Physician Endocrinology  04/22/18    Comment: thyroid nodules    Chief Complaint  Patient presents with   Hypertension    See mychart message      Subjective: Pt presents for an OV with complaints of elevated blood pressures over the last few weeks.  Her blood pressures are ranging in the high [id number] systolic and always greater than 80 diastolic.  She does endorse feeling like she is retaining fluid in her hands and feet.  She exercises routinely.  She eats a healthy diet, but does not watch sodium content.Patient denies chest pain, shortness of breath, dizziness.    Depression screen [facility] 2/9 05/14/2020 05/10/2019 04/22/2018 04/14/2017 01/29/2012  Decreased Interest 0 0 0 0 0  Down, Depressed, Hopeless 0 0 0 0 0  PHQ - 2 Score 0 0 0 0 0  Altered sleeping - - 0 - -  Tired, decreased energy - - 0 - -  Change in appetite - - 0 - -  Feeling bad or failure about yourself  - - 0 - -  Trouble concentrating - - 0 - -  Moving slowly or fidgety/restless - - 0  - -  Suicidal thoughts - - 0 - -  PHQ-9 Score - - 0 - -  Difficult doing work/chores - - Not difficult at all - -  Some recent data might be hidden    No Known Allergies Social History   Social History Narrative   Married. Bobby.   Children: none. No pets.    Stay at home.    Wears her seatbelt. Smoke alarm in the home.    Feels safe in her relationships.    Exercise 5x week.    Past Medical History:  Diagnosis Date   Adjustment insomnia 02/23/2019   Breast cancer ([place]) 2018   Lumpectomy, chemotherapy and radiation with recurrence 2018. Now on Femara   Cervical radiculopathy 07/05/2019   COVID-19 02/2019   Her husband also passed away at this time from COVID-19.   Family history of breast cancer    Genetic testing 10/30/2016   Breast/GYN panel (23 genes) @ [facility] - No pathogenic mutations detected   GERD (gastroesophageal reflux disease)    OTC medication   Grief reaction 02/23/2019   Heart murmur    as a child - Echo in early 20s "ok"   History of echocardiogram 05/2015   Echo 4/17: EF 55-60%, GLS -19.6%, normal wall motion   History of exercise stress test 05/2015   ETT 4/17 - No ST changes   HLD (hyperlipidemia)    Multiple  thyroid nodules 2009   Multiple nodules: Normal ultrasound reported at diagnosis   Osteoporosis    Personal history of chemotherapy    Personal history of radiation therapy    UNEQUAL LEG LENGTH, ACQUIRED 11/17/2006   Qualifier: Diagnosis of  By: Oneida Alar MD, KARL     Past Surgical History:  Procedure Laterality Date   ABDOMINAL HYSTERECTOMY     BREAST LUMPECTOMY Right 2003   BREAST RECONSTRUCTION WITH PLACEMENT OF TISSUE EXPANDER AND FLEX HD (ACELLULAR HYDRATED DERMIS) Bilateral 11/17/2016   Procedure: IMEDITATE BREAST RECONSTRUCTION WITH PLACEMENT OF TISSUE EXPANDER AND FLEX HD (ACELLULAR HYDRATED DERMIS);  Surgeon: Wallace Going, DO;  Location: [place];  Service: Plastics;  Laterality: Bilateral;   BREAST SURGERY  2003 /right    lumpectomy; chemotherapy and radiation   LIPOSUCTION WITH LIPOFILLING Bilateral 02/23/2020   Procedure: Fat filling of breasts for symmetry from abdomen;  Surgeon: Wallace Going, DO;  Location: [place];  Service: Plastics;  Laterality: Bilateral;  90 min, please   REMOVAL OF BILATERAL TISSUE EXPANDERS WITH PLACEMENT OF BILATERAL BREAST IMPLANTS Bilateral 02/11/2017   Procedure: REMOVAL OF BILATERAL TISSUE EXPANDERS WITH PLACEMENT OF BILATERAL SILICONE BREAST IMPLANTS;  Surgeon: Wallace Going, DO;  Location: [place];  Service: Plastics;  Laterality: Bilateral;   TOTAL ABDOMINAL HYSTERECTOMY W/ BILATERAL SALPINGOOPHORECTOMY     for treatment of breast treatment   TOTAL MASTECTOMY Bilateral 11/17/2016   Procedure: RIGHT MASTECTOMY AND LEFT PROPHYLATIC MASTECTOMY;  Surgeon: Autumn Messing III, MD;  Location: [place];  Service: General;  Laterality: Bilateral;   Family History  Problem Relation Age of Onset   Diabetes Other        1ST DEGREE RELATIVE   Coronary artery disease Mother        1ST DEGREE RELATIVE   Heart attack Mother 48   Skin cancer Mother    Diabetes Father    Kidney disease Father    Prostate cancer Father 48       deceased 50   Cancer Brother 46       multiple myeloma    Lung cancer Maternal Uncle        in 2 mat uncles   Breast cancer Paternal Aunt 74   Breast cancer Cousin        dx 19s; daughter of unaffected paternal aunt   Breast cancer Cousin        dx 59s; another daughter of unaffected paternal aunt   Esophageal cancer Cousin        pat first cousin   Allergies as of 08/10/2020   No Known Allergies      Medication List        Accurate as of August 10, 2020  1:09 PM. If you have any questions, ask your nurse or doctor.          STOP taking these medications    Pfizer-BioNT COVID-19 Vac-TriS Susp injection Generic drug: COVID-19 mRNA Vac-TriS Therapist, music) Stopped by: Howard Pouch, DO       TAKE these  medications    atorvastatin 20 MG tablet Commonly known as: LIPITOR Take 1 tablet (20 mg total) by mouth daily.   Calcium 500 MG Chew Chew by mouth.   cetirizine 10 MG tablet Commonly known as: ZYRTEC Take 10 mg by mouth as needed.   Collagenase Powd by Does not apply route.   denosumab 60 MG/ML Soln injection Commonly known as: PROLIA Inject 60 mg into the skin every 6 (  six) months. Administer in upper arm, thigh, or abdomen   diclofenac 75 MG EC tablet Commonly known as: VOLTAREN Take 1 tablet (75 mg total) by mouth 2 (two) times daily.   ELDERBERRY PO Take by mouth.   exemestane 25 MG tablet Commonly known as: AROMASIN TAKE 1 TABLET (25 MG TOTAL) BY MOUTH DAILY AFTER BREAKFAST.   Fish Oil 1200 MG Caps   hydrochlorothiazide 12.5 MG tablet Commonly known as: HYDRODIURIL Take 1 tablet (12.5 mg total) by mouth daily. Started by: Howard Pouch, DO   psyllium 58.6 % powder Commonly known as: METAMUCIL Take 1 packet by mouth daily.   Vitamin D (Cholecalciferol) 25 MCG (1000 UT) Caps Take 1 capsule by mouth daily.        All past medical history, surgical history, allergies, family history, immunizations andmedications were updated in the EMR today and reviewed under the history and medication portions of their EMR.     ROS: Negative, with the exception of above mentioned in HPI   Objective:  BP 126/83   Pulse (!) 59   Temp 98.2 F (36.8 C) (Oral)   Ht $R'5\' 4"'EQ$  (1.626 m)   Wt 155 lb (70.3 kg)   SpO2 99%   BMI 26.61 kg/m  Body mass index is 26.61 kg/m. Gen: Afebrile. No acute distress. Nontoxic in appearance, well developed, well nourished.  Very pleasant female HENT: AT. [place]. B Eyes:Pupils Equal Round Reactive to light, Extraocular movements intact,  Conjunctiva without redness, discharge or icterus. CV: RRR no murmur, no edema Chest: CTAB, no wheeze or crackles. Good air movement, normal resp effort.  Neuro:  Normal gait. PERLA. EOMi. Alert. Oriented x3   Psych: Normal affect, dress and demeanor. Normal speech. Normal thought content and judgment.  No results found. No results found. No results found for this or any previous visit (from the past 24 hour(s)).  Assessment/Plan: BRITTNIE LEWEY is a 60 y.o. female present for OV for  Essential hypertension Start HCTZ 12.5 mg daily. Continue exercise regimen. Low-sodium diet encouraged with AVS information provided. Patient is make an appointment for weight loss counseling.  We will recheck her blood pressure at that visit.  Reviewed expectations re: course of current medical issues. Discussed self-management of symptoms. Outlined signs and symptoms indicating need for more acute intervention. Patient verbalized understanding and all questions were answered. Patient received an After-Visit Summary.    No orders of the defined types were placed in this encounter.  Meds ordered this encounter  Medications   hydrochlorothiazide (HYDRODIURIL) 12.5 MG tablet    Sig: Take 1 tablet (12.5 mg total) by mouth daily.    Dispense:  90 tablet    Refill:  1   Referral Orders  No referral(s) requested today     Note is dictated utilizing voice recognition software. Although note has been proof read prior to signing, occasional typographical errors still can be missed. If any questions arise, please do not hesitate to call for verification.   electronically signed by:  Howard Pouch, DO  [place]

## 2020-09-12 DIAGNOSIS — Z719 Counseling, unspecified: Secondary | ICD-10-CM

## 2020-10-14 ENCOUNTER — Other Ambulatory Visit: Payer: Self-pay | Admitting: Hematology

## 2020-11-01 ENCOUNTER — Telehealth (INDEPENDENT_AMBULATORY_CARE_PROVIDER_SITE_OTHER): Payer: BC Managed Care – PPO | Admitting: Family Medicine

## 2020-11-01 ENCOUNTER — Telehealth: Payer: Self-pay | Admitting: Hematology

## 2020-11-01 ENCOUNTER — Encounter: Payer: Self-pay | Admitting: Family Medicine

## 2020-11-01 DIAGNOSIS — U071 COVID-19: Secondary | ICD-10-CM | POA: Diagnosis not present

## 2020-11-01 MED ORDER — BENZONATATE 100 MG PO CAPS: 100.0000 mg | ORAL_CAPSULE | Freq: Three times a day (TID) | ORAL | 0 refills | Status: DC | PRN

## 2020-11-01 MED ORDER — NIRMATRELVIR/RITONAVIR (PAXLOVID)TABLET: 3.0000 | ORAL_TABLET | Freq: Two times a day (BID) | ORAL | 0 refills | Status: AC

## 2020-11-01 NOTE — Patient Instructions (Addendum)
HOME CARE TIPS:   -I sent the medication(s) we discussed to your pharmacy: Meds ordered this encounter  Medications   benzonatate (TESSALON PERLES) 100 MG capsule    Sig: Take 1 capsule (100 mg total) by mouth 3 (three) times daily as needed.    Dispense:  20 capsule    Refill:  0   nirmatrelvir/ritonavir EUA (PAXLOVID) 20 x 150 MG & 10 x 100MG TABS    Sig: Take 3 tablets by mouth 2 (two) times daily for 5 days. (Take nirmatrelvir 150 mg two tablets twice daily for 5 days and ritonavir 100 mg one tablet twice daily for 5 days) Patient GFR was > 60 in April    Dispense:  30 tablet    Refill:  0     -I sent in the Covid19 treatment or referral you requested per our discussion. Please see the information provided below and discuss further with the pharmacist/treatment team.  -If taking Paxlovid, please review all medications, supplement and over the counter drugs with your pharmacist and ask them to check for any interactions. Please make the following changes to your regular medications while taking Paxlovid: *STOP your cholesterol medication (lipitor) and restart 3 days after finishing treatment  -If taking Paxlovid, there is a chance of rebound illness after finishing your treatment. If you become sick again please isolate for an additional 5 days.   -can use nasal saline a few times per day if you have nasal congestion; sometimes  a short course of Afrin nasal spray for 3 days can help with symptoms as well  -stay hydrated, drink plenty of fluids and eat small healthy meals - avoid dairy  -If the Covid test is positive, check out the [facility] website for more information on home care, transmission and treatment for COVID19  -follow up with your doctor in 2-3 days unless improving and feeling better  -stay home while sick, except to seek medical care. If you have COVID19, ideally it would be best to stay home for a full 10 days since the onset of symptoms PLUS one day of no fever and  feeling better. Wear a good mask that fits snugly (such as N95 or KN95) if around others to reduce the risk of transmission.  It was nice to meet you today, and I really hope you are feeling better soon. I help [facility] out with telemedicine visits on Tuesdays and Thursdays and am available for visits on those days. If you have any concerns or questions following this visit please schedule a follow up visit with your Primary Care doctor or seek care at a local urgent care clinic to avoid delays in care.    Seek in person care or schedule a follow up video visit promptly if your symptoms worsen, new concerns arise or you are not improving with treatment. Call 911 and/or seek emergency care if your symptoms are severe or life threatening.  FACT SHEET FOR PATIENTS, PARENTS, AND CAREGIVERS EMERGENCY USE AUTHORIZATION (EUA) OF PAXLOVID FOR CORONAVIRUS DISEASE 2019 (COVID-19) You are being given this Fact Sheet because your healthcare provider believes it is necessary to provide you with PAXLOVID for the treatment of mild-to-moderate coronavirus disease (COVID-19) caused by the SARS-CoV-2 virus. This Fact Sheet contains information to help you understand the risks and benefits of taking the PAXLOVID you have received or may receive. The U.S. Food and Drug Administration (FDA) has issued an Emergency Use Authorization (EUA) to make PAXLOVID available during the COVID-19 pandemic (for more details  about an EUA please see "What is an Emergency Use Authorization?" at the end of this document). PAXLOVID is not an FDA-approved medicine in the [place]. Read this Fact Sheet for information about PAXLOVID. Talk to your healthcare provider about your options or if you have any questions. It is your choice to take PAXLOVID.  What is COVID-19? COVID-19 is caused by a virus called a coronavirus. You can get COVID-19 through close contact with another person who has the virus. COVID-19 illnesses have  ranged from very mild-to-severe, including illness resulting in death. While information so far suggests that most COVID-19 illness is mild, serious illness can happen and may cause some of your other medical conditions to become worse. Older people and people of all ages with severe, long lasting (chronic) medical conditions like heart disease, lung disease, and diabetes, for example seem to be at higher risk of being hospitalized for COVID-19.  What is PAXLOVID? PAXLOVID is an investigational medicine used to treat mild-to-moderate COVID-19 in adults and children [59 years of age and older weighing at least 42 pounds (65 kg)] with positive results of direct SARS-CoV-2 viral testing, and who are at high risk for progression to severe COVID-19, including hospitalization or death. PAXLOVID is investigational because it is still being studied. There is limited information about the safety and effectiveness of using PAXLOVID to treat people with mild-to-moderate COVID-19.  The FDA has authorized the emergency use of PAXLOVID for the treatment of mild-tomoderate COVID-19 in adults and children [59 years of age and older weighing at least 44 pounds (41 kg)] with a positive test for the virus that causes COVID-19, and who are at high risk for progression to severe COVID-19, including hospitalization or death, under an EUA. 1 Revised: 20 April 2020   What should I tell my healthcare provider before I take PAXLOVID? Tell your healthcare provider if you: ? Have any allergies ? Have liver or kidney disease ? Are pregnant or plan to become pregnant ? Are breastfeeding a child ? Have any serious illnesses  Tell your healthcare provider about all the medicines you take, including prescription and over-the-counter medicines, vitamins, and herbal supplements. Some medicines may interact with PAXLOVID and may cause serious side effects. Keep a list of your medicines to show your healthcare provider  and pharmacist when you get a new medicine.  You can ask your healthcare provider or pharmacist for a list of medicines that interact with PAXLOVID. Do not start taking a new medicine without telling your healthcare provider. Your healthcare provider can tell you if it is safe to take PAXLOVID with other medicines.  Tell your healthcare provider if you are taking combined hormonal contraceptive. PAXLOVID may affect how your birth control pills work. Females who are able to become pregnant should use another effective alternative form of contraception or an additional barrier method of contraception. Talk to your healthcare provider if you have any questions about contraceptive methods that might be right for you.  How do I take PAXLOVID? ? PAXLOVID consists of 2 medicines: nirmatrelvir and ritonavir. o Take 2 pink tablets of nirmatrelvir with 1 white tablet of ritonavir by mouth 2 times each day (in the morning and in the evening) for 5 days. For each dose, take all 3 tablets at the same time. o If you have kidney disease, talk to your healthcare provider. You may need a different dose. ? Swallow the tablets whole. Do not chew, break, or crush the tablets. ? Take PAXLOVID with  or without food. ? Do not stop taking PAXLOVID without talking to your healthcare provider, even if you feel better. ? If you miss a dose of PAXLOVID within 8 hours of the time it is usually taken, take it as soon as you remember. If you miss a dose by more than 8 hours, skip the missed dose and take the next dose at your regular time. Do not take 2 doses of PAXLOVID at the same time. ? If you take too much PAXLOVID, call your healthcare provider or go to the nearest hospital emergency room right away. ? If you are taking a ritonavir- or cobicistat-containing medicine to treat hepatitis C or Human Immunodeficiency Virus (HIV), you should continue to take your medicine as prescribed by your healthcare  provider. 2 Revised: 20 April 2020    Talk to your healthcare provider if you do not feel better or if you feel worse after 5 days.  Who should generally not take PAXLOVID? Do not take PAXLOVID if: ? You are allergic to nirmatrelvir, ritonavir, or any of the ingredients in PAXLOVID. ? You are taking any of the following medicines: o Alfuzosin o Pethidine, propoxyphene o Ranolazine o Amiodarone, dronedarone, flecainide, propafenone, quinidine o Colchicine o Lurasidone, pimozide, clozapine o Dihydroergotamine, ergotamine, methylergonovine o Lovastatin, simvastatin o Sildenafil (Revatio) for pulmonary arterial hypertension (PAH) o Triazolam, oral midazolam o Apalutamide o Carbamazepine, phenobarbital, phenytoin o Rifampin o St. John's Wort (hypericum perforatum) Taking PAXLOVID with these medicines may cause serious or life-threatening side effects or affect how PAXLOVID works.  These are not the only medicines that may cause serious side effects if taken with PAXLOVID. PAXLOVID may increase or decrease the levels of multiple other medicines. It is very important to tell your healthcare provider about all of the medicines you are taking because additional laboratory tests or changes in the dose of your other medicines may be necessary while you are taking PAXLOVID. Your healthcare provider may also tell you about specific symptoms to watch out for that may indicate that you need to stop or decrease the dose of some of your other medicines.  What are the important possible side effects of PAXLOVID? Possible side effects of PAXLOVID are: ? Allergic Reactions. Allergic reactions can happen in people taking PAXLOVID, even after only 1 dose. Stop taking PAXLOVID and call your healthcare provider right away if you get any of the following symptoms of an allergic reaction: o hives o trouble swallowing or breathing o swelling of the mouth, lips, or face o throat tightness o  hoarseness 3 Revised: 20 April 2020  o skin rash ? Liver Problems. Tell your healthcare provider right away if you have any of these signs and symptoms of liver problems: loss of appetite, yellowing of your skin and the whites of eyes (jaundice), dark-colored urine, pale colored stools and itchy skin, stomach area (abdominal) pain. ? Resistance to HIV Medicines. If you have untreated HIV infection, PAXLOVID may lead to some HIV medicines not working as well in the future. ? Other possible side effects include: o altered sense of taste o diarrhea o high blood pressure o muscle aches These are not all the possible side effects of PAXLOVID. Not many people have taken PAXLOVID. Serious and unexpected side effects may happen. PAXLOVID is still being studied, so it is possible that all of the risks are not known at this time.  What other treatment choices are there? Veklury (remdesivir) is FDA-approved for the treatment of mild-to-moderate [id number] in certain adults  and children. Talk with your doctor to see if Marijean Heath is appropriate for you. Like PAXLOVID, FDA may also allow for the emergency use of other medicines to treat people with COVID-19. Go to https://price.info/ for information on the emergency use of other medicines that are authorized by FDA to treat people with COVID-19. Your healthcare provider may talk with you about clinical trials for which you may be eligible. It is your choice to be treated or not to be treated with PAXLOVID. Should you decide not to receive it or for your child not to receive it, it will not change your standard medical care.  What if I am pregnant or breastfeeding? There is no experience treating pregnant women or breastfeeding mothers with PAXLOVID. For a mother and unborn baby, the benefit of taking PAXLOVID may be greater than the risk from the  treatment. If you are pregnant, discuss your options and specific situation with your healthcare provider. It is recommended that you use effective barrier contraception or do not have sexual activity while taking PAXLOVID. If you are breastfeeding, discuss your options and specific situation with your healthcare provider. 4 Revised: 20 April 2020   How do I report side effects with PAXLOVID? Contact your healthcare provider if you have any side effects that bother you or do not go away. Report side effects to FDA MedWatch at SmoothHits.hu or call 1-800-FDA1088 or you can report side effects to [employer]. at the contact information provided below. Website Fax number Telephone number www.pfizersafetyreporting.com [phone] How should I store [place]? Store PAXLOVID tablets at room temperature, between 68?F to 77?F (20?C to 25?C). How can I learn more about COVID-19? ? Ask your healthcare provider. ? Visit https://jacobson-johnson.com/. ? Contact your local or state public health department. What is an Emergency Use Authorization (EUA)? The [place] FDA has made PAXLOVID available under an emergency access mechanism called an Emergency Use Authorization (EUA). The EUA is supported by a Education officer, museum and Human Service (HHS) declaration that circumstances exist to justify the emergency use of drugs and biological products during the COVID-19 pandemic. PAXLOVID for the treatment of mild-to-moderate COVID-19 in adults and children [53 years of age and older weighing at least 56 pounds (16 kg)] with positive results of direct SARS-CoV-2 viral testing, and who are at high risk for progression to severe COVID-19, including hospitalization or death, has not undergone the same type of review as an FDA-approved product. In issuing an EUA under the HKVQQ-59 public health emergency, the FDA has determined, among other things, that based on the total amount of  scientific evidence available including data from adequate and well-controlled clinical trials, if available, it is reasonable to believe that the product may be effective for diagnosing, treating, or preventing COVID-19, or a serious or life-threatening disease or condition caused by COVID-19; that the known and potential benefits of the product, when used to diagnose, treat, or prevent such disease or condition, outweigh the known and potential risks of such product; and that there are no adequate, approved, and available alternatives. All of these criteria must be met to allow for the product to be used in the treatment of patients during the COVID-19 pandemic. The EUA for PAXLOVID is in effect for the duration of the COVID-19 declaration justifying emergency use of this product, unless terminated or revoked (after which the products may no longer be used under the EUA). 5 Revised: 20 April 2020     Additional Information For general questions, visit the  website or call the telephone number provided below. Website Telephone number www.COVID19oralRx.com [phone] (1-877-C19-PACK) You can also go to www.pfizermedinfo.com or call [phone] for more information. [id number] Revised: 20 April 2020

## 2020-11-01 NOTE — Progress Notes (Signed)
Virtual Visit via Video Note  I connected with Olivia [name]  on 11/01/20 at  4:40 PM EDT by a video enabled telemedicine application and verified that I am speaking with the correct person using two identifiers.  Location patient: home, [place] Location provider:work or home office Persons participating in the virtual visit: patient, provider  I discussed the limitations of evaluation and management by telemedicine and the availability of in person appointments. The patient expressed understanding and agreed to proceed.   HPI:  Acute telemedicine visit for Covid19: -Onset: yesterday; positive test today -Symptoms include: nasal congestion, cough, ears fell full, body aches -Denies:fever, CP, SOB, NVD, inability to eat, drink or get out of bed -Pertinent past medical history:see below -Pertinent medication allergies: No Known Allergies -COVID-19 vaccine status: 2 doses and 2 boosters -GFR > 60 in April -she would like to take paxlovid  ROS: See pertinent positives and negatives per HPI.  Past Medical History:  Diagnosis Date   Adjustment insomnia 02/23/2019   Breast cancer ([place]) 2018   Lumpectomy, chemotherapy and radiation with recurrence 2018. Now on Femara   Cervical radiculopathy 07/05/2019   COVID-19 02/2019   Her husband also passed away at this time from COVID-19.   Family history of breast cancer    Genetic testing 10/30/2016   Breast/GYN panel (23 genes) @ [facility] - No pathogenic mutations detected   GERD (gastroesophageal reflux disease)    OTC medication   Grief reaction 02/23/2019   Heart murmur    as a child - Echo in early 20s "ok"   History of echocardiogram 05/2015   Echo 4/17: EF 55-60%, GLS -19.6%, normal wall motion   History of exercise stress test 05/2015   ETT 4/17 - No ST changes   HLD (hyperlipidemia)    Multiple thyroid nodules 2009   Multiple nodules: Normal ultrasound reported at diagnosis   Osteoporosis    Personal history of chemotherapy    Personal  history of radiation therapy    UNEQUAL LEG LENGTH, ACQUIRED 11/17/2006   Qualifier: Diagnosis of  By: Oneida Alar MD, KARL      Past Surgical History:  Procedure Laterality Date   ABDOMINAL HYSTERECTOMY     BREAST LUMPECTOMY Right 2003   BREAST RECONSTRUCTION WITH PLACEMENT OF TISSUE EXPANDER AND FLEX HD (ACELLULAR HYDRATED DERMIS) Bilateral 11/17/2016   Procedure: IMEDITATE BREAST RECONSTRUCTION WITH PLACEMENT OF TISSUE EXPANDER AND FLEX HD (ACELLULAR HYDRATED DERMIS);  Surgeon: Wallace Going, DO;  Location: [facility];  Service: Plastics;  Laterality: Bilateral;   BREAST SURGERY  2003 /right   lumpectomy; chemotherapy and radiation   LIPOSUCTION WITH LIPOFILLING Bilateral 02/23/2020   Procedure: Fat filling of breasts for symmetry from abdomen;  Surgeon: Wallace Going, DO;  Location: [facility];  Service: Plastics;  Laterality: Bilateral;  90 min, please   REMOVAL OF BILATERAL TISSUE EXPANDERS WITH PLACEMENT OF BILATERAL BREAST IMPLANTS Bilateral 02/11/2017   Procedure: REMOVAL OF BILATERAL TISSUE EXPANDERS WITH PLACEMENT OF BILATERAL SILICONE BREAST IMPLANTS;  Surgeon: Wallace Going, DO;  Location: [facility];  Service: Plastics;  Laterality: Bilateral;   TOTAL ABDOMINAL HYSTERECTOMY W/ BILATERAL SALPINGOOPHORECTOMY     for treatment of breast treatment   TOTAL MASTECTOMY Bilateral 11/17/2016   Procedure: RIGHT MASTECTOMY AND LEFT PROPHYLATIC MASTECTOMY;  Surgeon: Jovita Kussmaul, MD;  Location: [facility];  Service: General;  Laterality: Bilateral;     Current Outpatient Medications:    atorvastatin (LIPITOR) 20 MG tablet, Take 1 tablet (20 mg total)  by mouth daily., Disp: 90 tablet, Rfl: 3   benzonatate (TESSALON PERLES) 100 MG capsule, Take 1 capsule (100 mg total) by mouth 3 (three) times daily as needed., Disp: 20 capsule, Rfl: 0   Calcium 500 MG CHEW, Chew by mouth., Disp: , Rfl:    cetirizine (ZYRTEC) 10 MG tablet, Take 10 mg by mouth as needed.,  Disp: , Rfl:    Collagenase POWD, by Does not apply route., Disp: , Rfl:    denosumab (PROLIA) 60 MG/ML SOLN injection, Inject 60 mg into the skin every 6 (six) months. Administer in upper arm, thigh, or abdomen, Disp: 1 Syringe, Rfl: 1   diclofenac (VOLTAREN) 75 MG EC tablet, Take 1 tablet (75 mg total) by mouth 2 (two) times daily., Disp: 90 tablet, Rfl: 1   ELDERBERRY PO, Take by mouth., Disp: , Rfl:    exemestane (AROMASIN) 25 MG tablet, TAKE 1 TABLET (25 MG TOTAL) BY MOUTH DAILY AFTER BREAKFAST., Disp: 90 tablet, Rfl: 1   hydrochlorothiazide (HYDRODIURIL) 12.5 MG tablet, Take 1 tablet (12.5 mg total) by mouth daily., Disp: 90 tablet, Rfl: 1   nirmatrelvir/ritonavir EUA (PAXLOVID) 20 x 150 MG & 10 x 100MG  TABS, Take 3 tablets by mouth 2 (two) times daily for 5 days. (Take nirmatrelvir 150 mg two tablets twice daily for 5 days and ritonavir 100 mg one tablet twice daily for 5 days) Patient GFR was > 60 in April, Disp: 30 tablet, Rfl: 0   Omega-3 Fatty Acids (FISH OIL) 1200 MG CAPS, , Disp: , Rfl:    psyllium (METAMUCIL) 58.6 % powder, Take 1 packet by mouth daily., Disp: , Rfl:    Vitamin D, Cholecalciferol, 25 MCG (1000 UT) CAPS, Take 1 capsule by mouth daily., Disp: , Rfl:   EXAM:  VITALS per patient if applicable:  GENERAL: alert, oriented, appears well and in no acute distress  HEENT: atraumatic, conjunttiva clear, no obvious abnormalities on inspection of external nose and ears  NECK: normal movements of the head and neck  LUNGS: on inspection no signs of respiratory distress, breathing rate appears normal, no obvious gross SOB, gasping or wheezing  CV: no obvious cyanosis  MS: moves all visible extremities without noticeable abnormality  PSYCH/NEURO: pleasant and cooperative, no obvious depression or anxiety, speech and thought processing grossly intact  ASSESSMENT AND PLAN:  Discussed the following assessment and plan:  COVID-19   Discussed treatment options (infusions  and oral options and risk of drug interactions), ideal treatment window, potential complications, isolation and precautions for COVID-19.  Discussed possibility of rebound with antivirals and the need to reisolate if it should occur for 5 days. Checked for/reviewed any labs. She had labs with stable kidney function and gfr > 60. She wanted to go ahead with paxlovid without repeating labs. After lengthy discussion, the patient opted for treatment with PAxlovid due to being higher risk for complications of covid or severe disease and other factors. Discussed EUA status of this drug and the fact that there is preliminary limited knowledge of risks/interactions/side effects per EUA document vs possible benefits and precautions. Advised to hold Statin. This information was shared with patient during the visit and also was provided in patient instructions. Also, advised that patient discuss risks/interactions and use with pharmacist/treatment team as well. The patient did want a prescription for cough, Tessalon Rx sent.  Other symptomatic care measures summarized in patient instructions.  Advised to seek prompt in person care if worsening, new symptoms arise, or if is not improving with  treatment. Discussed options for inperson care if PCP office not available. Did let this patient know that I only do telemedicine on Tuesdays and Thursdays for [place]. Advised to schedule follow up visit with PCP or [facility] if any further questions or concerns to avoid delays in care.   I discussed the assessment and treatment plan with the patient. The patient was provided an opportunity to ask questions and all were answered. The patient agreed with the plan and demonstrated an understanding of the instructions.     Lucretia Kern, DO

## 2020-11-01 NOTE — Telephone Encounter (Signed)
R/s per patient. Covid positive. Called and spoke with patient. Confirmed new date and time

## 2020-11-05 ENCOUNTER — Inpatient Hospital Stay: Payer: BC Managed Care – PPO

## 2020-11-05 ENCOUNTER — Inpatient Hospital Stay: Payer: BC Managed Care – PPO | Admitting: Hematology

## 2020-11-11 ENCOUNTER — Encounter: Payer: Self-pay | Admitting: Family Medicine

## 2020-11-15 ENCOUNTER — Other Ambulatory Visit: Payer: Self-pay

## 2020-11-15 ENCOUNTER — Ambulatory Visit (INDEPENDENT_AMBULATORY_CARE_PROVIDER_SITE_OTHER): Payer: BC Managed Care – PPO

## 2020-11-15 DIAGNOSIS — M810 Age-related osteoporosis without current pathological fracture: Secondary | ICD-10-CM

## 2020-11-15 MED ORDER — DENOSUMAB 60 MG/ML ~~LOC~~ SOSY
60.0000 mg | PREFILLED_SYRINGE | Freq: Once | SUBCUTANEOUS | Status: AC
Start: 2020-11-15 — End: 2020-11-15
  Administered 2020-11-15: 60 mg via SUBCUTANEOUS

## 2020-11-15 NOTE — Progress Notes (Signed)
Olivia [name] is a 60 y.o. female presents to the office today for prolia injections, per physician's orders. Original order: 12.30.2016 Prolia 60mg  SQ was administered left arm today. Patient tolerated injection.

## 2020-11-23 ENCOUNTER — Other Ambulatory Visit: Payer: Self-pay | Admitting: *Deleted

## 2020-11-23 DIAGNOSIS — Z170 Estrogen receptor positive status [ER+]: Secondary | ICD-10-CM

## 2020-11-23 DIAGNOSIS — C50211 Malignant neoplasm of upper-inner quadrant of right female breast: Secondary | ICD-10-CM

## 2020-11-26 ENCOUNTER — Encounter: Payer: Self-pay | Admitting: Hematology

## 2020-11-26 ENCOUNTER — Inpatient Hospital Stay: Payer: BC Managed Care – PPO

## 2020-11-26 ENCOUNTER — Inpatient Hospital Stay (HOSPITAL_BASED_OUTPATIENT_CLINIC_OR_DEPARTMENT_OTHER): Payer: BC Managed Care – PPO | Admitting: Hematology

## 2020-11-26 ENCOUNTER — Inpatient Hospital Stay: Payer: BC Managed Care – PPO | Attending: Hematology

## 2020-11-26 ENCOUNTER — Other Ambulatory Visit: Payer: Self-pay

## 2020-11-26 VITALS — BP 141/92 | HR 72 | Temp 98.5°F | Resp 16 | Ht 64.0 in | Wt 154.6 lb

## 2020-11-26 DIAGNOSIS — Z23 Encounter for immunization: Secondary | ICD-10-CM

## 2020-11-26 DIAGNOSIS — Z170 Estrogen receptor positive status [ER+]: Secondary | ICD-10-CM | POA: Insufficient documentation

## 2020-11-26 DIAGNOSIS — Z90722 Acquired absence of ovaries, bilateral: Secondary | ICD-10-CM | POA: Insufficient documentation

## 2020-11-26 DIAGNOSIS — C50211 Malignant neoplasm of upper-inner quadrant of right female breast: Secondary | ICD-10-CM | POA: Insufficient documentation

## 2020-11-26 DIAGNOSIS — Z9013 Acquired absence of bilateral breasts and nipples: Secondary | ICD-10-CM | POA: Diagnosis not present

## 2020-11-26 DIAGNOSIS — M8580 Other specified disorders of bone density and structure, unspecified site: Secondary | ICD-10-CM | POA: Diagnosis not present

## 2020-11-26 LAB — CMP (CANCER CENTER ONLY)
ALT: 39 U/L (ref 0–44)
AST: 31 U/L (ref 15–41)
Albumin: 4.9 g/dL (ref 3.5–5.0)
Alkaline Phosphatase: 54 U/L (ref 38–126)
Anion gap: 8 (ref 5–15)
BUN: 18 mg/dL (ref 6–20)
CO2: 27 mmol/L (ref 22–32)
Calcium: 9.7 mg/dL (ref 8.9–10.3)
Chloride: 105 mmol/L (ref 98–111)
Creatinine: 0.86 mg/dL (ref 0.44–1.00)
GFR, Estimated: >60 mL/min (ref >60)
Glucose, Bld: 103 mg/dL — ABNORMAL HIGH (ref 70–99)
Potassium: 4.1 mmol/L (ref 3.5–5.1)
Sodium: 140 mmol/L (ref 135–145)
Total Bilirubin: 0.9 mg/dL (ref 0.3–1.2)
Total Protein: 7.6 g/dL (ref 6.5–8.1)

## 2020-11-26 LAB — CBC WITH DIFFERENTIAL (CANCER CENTER ONLY)
Abs Immature Granulocytes: 0.01 10*3/uL (ref 0.00–0.07)
Basophils Absolute: 0 10*3/uL (ref 0.0–0.1)
Basophils Relative: 0 %
Eosinophils Absolute: 0.1 10*3/uL (ref 0.0–0.5)
Eosinophils Relative: 1 %
HCT: 42.2 % (ref 36.0–46.0)
Hemoglobin: 14.5 g/dL (ref 12.0–15.0)
Immature Granulocytes: 0 %
Lymphocytes Relative: 30 %
Lymphs Abs: 1.7 10*3/uL (ref 0.7–4.0)
MCH: 29.5 pg (ref 26.0–34.0)
MCHC: 34.4 g/dL (ref 30.0–36.0)
MCV: 85.8 fL (ref 80.0–100.0)
Monocytes Absolute: 0.4 10*3/uL (ref 0.1–1.0)
Monocytes Relative: 8 %
Neutro Abs: 3.4 10*3/uL (ref 1.7–7.7)
Neutrophils Relative %: 61 %
Platelet Count: 272 10*3/uL (ref 150–400)
RBC: 4.92 MIL/uL (ref 3.87–5.11)
RDW: 13.2 % (ref 11.5–15.5)
WBC Count: 5.6 10*3/uL (ref 4.0–10.5)
nRBC: 0 % (ref 0.0–0.2)

## 2020-11-26 MED ORDER — INFLUENZA VAC SPLIT QUAD 0.5 ML IM SUSY
0.5000 mL | PREFILLED_SYRINGE | Freq: Once | INTRAMUSCULAR | Status: AC
  Administered 2020-11-26: 0.5 mL via INTRAMUSCULAR
  Filled 2020-11-26: qty 0.5

## 2020-11-26 NOTE — Progress Notes (Signed)
Flu shot administered in the left deltoid.  Pressure and bandaid applied to site.  Pt tolerated procedure well.

## 2020-11-26 NOTE — Progress Notes (Signed)
[place]   [phone] [phone]   Clinic Follow up Note   Patient Care Team: Ma Hillock, DO as PCP - General (Family Medicine) Truitt Merle, MD as Consulting Physician (Hematology) Jovita Kussmaul, MD as Consulting Physician (General Surgery) Gery Pray, MD as Consulting Physician (Radiation Oncology) Sable Feil, MD as Consulting Physician (Gastroenterology) Molli Posey, MD as Consulting Physician (Obstetrics and Gynecology) Delice Bison Charlestine Massed, NP as Nurse Practitioner (Hematology and Oncology) Elayne Snare, MD as Consulting Physician (Endocrinology)  Date of Service:  11/26/2020  CHIEF COMPLAINT: f/u of right breast cancer  CURRENT THERAPY:  Adjuvant Letrozole 2.5 mg daily; began 12/18/16. Due to arthritis I switched her to Exemestane $RemoveBefor'25mg'yBEoWgyAwBDO$  daily in 11/2019.   ASSESSMENT & PLAN:  Olivia [name] is a 60 y.o. female with   1. Malignant neoplasm of upper-inner quadrant of right breast, invasive ductal carcinoma, pT2N0M0, Stage 1B, ER/PR: positive, HER2 negative, Grade 3, Oncotype RS 21 -She was diagnosed in 10/2016. She is s/p b/l mastectomy and reconstruction. She did not need chemo based on Oncotype RS. She had node-negative mastectomy, therefore will not require adjuvant radiation -Had b/l mastectomy and does not need any mammograms. S/p plastic surgery on 06-Mar-2017, b/l fat transfer on 02/23/20. and nipple tattooing with Dr Marla Roe.  -She started Letrozole on 12/18/16. Due to arthritis I switched her to Exemestane $RemoveBefor'25mg'ZJyksAeQbLOO$  daily in 11/2019 due to arthralgia. Tolerating better. Plan for 7 years total treatment.   -She is clinically doing well. Lab reviewed, her CBC WNL, CMP pending. Her physical exam was unremarkable. There is no clinical concern for recurrence. -Continue surveillance and Exemestane  -F/u in 6 months    2. History of Right Breast Cancer, pT2N1M0, ER+/pr+/her2-, in 2003, Genetic testing was negative -Treated  by Dr. Eston Esters -s/p lumpectomy and axillary lymph node dissection (3/7 positive lymph nodes), chemo, radiation, tamoxifen for 2 years and Anastrozole for 3 years -s/p Total hysterectomy/BSO in 2006    3. Osteopenia  -She was previously on Fosamax for 5 years, then Prolia injections every 6 months through her PCP since 2016, I recommend her to stop since it's been 5-6 years.  -01/2017 DEXA showed significant improvement from osteoporosis to osteopenia (T-Score of -2). Her 07/2019 DEXA improved with T-score -1.8 at right femur neck.  -Continue calcium, vitamin D and exercise. She will discuss stopping the Prolia injections with her PCP.   5. Thyroid Nodules -Seen on 12/24/16 CT Chest. Continue to f/u with her PCP Dr. Raoul Pitch.    6. H/o COVID19 (+March 07, 2019, 10/2020.  -Treated antibody injection in 03/07/19 and was able to recover well.  -Her husband who had it before her unfortunately died from [place] in 2019/03/07.  -she has received 4 doses of the Covid vaccine, last 07/20/20 -tested positive again on 11/01/20   8. GI symptoms  -She notes for the past year, having gut issues with bloating. She has BM every other day but has to strain and will have pencil shaped stool at times. She notes her last colonoscopy was in 2014 with Dr Sharlett Iles which was completely negative.  -she has tried senokot, but from her description, it sounds like this caused diarrhea. She will f/u with GI.     PLAN: -Continue Exemestane  -Lab and F/u with NP Lacie in 6 months  -OK to stop Prolia, she will discuss with her PCP    No problem-specific Assessment & Plan notes found for this encounter.   SUMMARY OF ONCOLOGIC HISTORY:  Oncology History Overview Note  Cancer Staging Malignant neoplasm of upper-inner quadrant of right breast in female, estrogen receptor positive ([place]) Staging form: Breast, AJCC 8th Edition - Clinical stage from 10/09/2016: Stage IA (cT1c, cN0, cM0, G3, ER: Positive, PR: Positive, HER2: Negative)  - Signed by Truitt Merle, MD on 10/14/2016 - Pathologic stage from 11/17/2016: Stage IB (pT2, pN0, cM0, G3, ER: Positive, PR: Positive, HER2: Negative, Oncotype DX score: 21) - Signed by Truitt Merle, MD on 12/11/2016     Malignant neoplasm of upper-inner quadrant of right breast in female, estrogen receptor positive ([place])  10/08/2016 Mammogram   Diagnostic mammogram and ultrasound showed an irregular mass in the right breast at 1 o'clock, 5 cm from the nipple measuring 1.6 x 1.4x 1.6 cm, axilla was negative for adenopathy.   10/09/2016 Receptors her2   Estrogen Receptor: 80%, POSITIVE, STRONG STAINING INTENSITY Progesterone Receptor: 30%, POSITIVE, STRONG STAINING INTENSITY Proliferation Marker Ki67: 35% HER2 NEGATIVE    10/09/2016 Initial Diagnosis   Malignant neoplasm of upper-inner quadrant of right breast in female, estrogen receptor positive ([place])   10/09/2016 Initial Biopsy   Diagnosis Breast, right, needle core biopsy, upper inner quadrant, 1:00 o'clock - INVASIVE DUCTAL CARCINOMA, G3   10/2016 Genetic Testing   Genetic testing was negative for The genes analyzed were the 23 genes on [facility]'s Breast/GYN panel (ATM, BARD1, BRCA1, BRCA2, BRIP1, CDH1, CHEK2, DICER1, EPCAM, MLH1,  MSH2, MSH6, NBN, NF1, PALB2, PMS2, PTEN, RAD50, RAD51C, RAD51D,SMARCA4, STK11, and TP53).     11/17/2016 Surgery   RIGHT MASTECTOMY AND LEFT PROPHYLATIC MASTECTOMY by Dr. Marlou Starks  IMEDITATE BREAST RECONSTRUCTION WITH PLACEMENT OF TISSUE EXPANDER AND FLEX HD (ACELLULAR HYDRATED DERMIS) by Dr. Marla Roe     11/17/2016 Pathology Results   Diagnosis 11/17/16 1. Breast, simple mastectomy, Left - BENIGN BREAST TISSUE WITH ECTATIC DUCTS. - ONE OF ONE LYMPH NODE NEGATIVE FOR CARCINOMA (0/1). 2. Breast, simple mastectomy, Right - INVASIVE DUCTAL CARCINOMA, GRADE 3, SPANNING 2.4 CM. - HIGH GRADE DUCTAL CARCINOMA IN SITU. - INVASIVE CARCINOMA IS <0.1 CM OF THE POSTERIOR MARGIN (BROADLY) AND ANTERIOR MARGIN (FOCALLY). -  IN SITU CARCINOMA IS >0.2 CM OF BOTH MARGINS. - PRIOR LUMPECTOMY SITE, NEGATIVE FOR CARCINOMA. - SEE ONCOLOGY TABLE. 3. Breast, excision, Superior flap deep margin - BENIGN FIBROADIPOSE TISSUE.   11/17/2016 Miscellaneous   Oncotype on 11/17/16 Rcurence Risk Score of 21 10-Year risk of distant reucrrence with Tamoxifen alone is 13%    12/18/2016 -  Anti-estrogen oral therapy   Adjuvant Letrozole 2.5 mg daily; began 12/18/16. Due to arthritis I switched her to Exemestane $RemoveBefor'25mg'RtxQayKwRcLn$  daily in 11/2019.    12/24/2016 Imaging   BONE SCAN IMPRESSION: No evidence of osseous metastatic disease.   12/24/2016 Imaging   CT CAP IMPRESSION: 1. Status post bilateral modified radical mastectomy and breast reconstruction, as above. No findings to suggest metastatic disease in the chest, abdomen or pelvis. 2. Multiple heterogeneous appearing thyroid nodules, as discussed above. These are nonspecific but further evaluation with nonemergent thyroid ultrasound is recommended in the near future to determine any potential need for further evaluation with fine-needle aspiration. 3. Incidental findings, as above.   02/11/2017 Surgery   REMOVAL OF BILATERAL TISSUE EXPANDERS WITH PLACEMENT OF BILATERAL SILICONE BREAST IMPLANTS by Dr. Marla Roe       INTERVAL HISTORY:  Olivia [name] is here for a follow up of breast cancer. She was last seen by me on 05/07/20. She presents to the clinic alone. She reports she is having a great fall.  She notes she visited [place], which she greatly enjoyed. She did, however, test positive for Covid on her return. She reports she received paxlovid and overall did not feel unwell. She reports she has some joint pain when she wakes up in the morning, but it resolves as she moves around. She notes continued bloating, which is not as bad. She notes she tried senokot, but she goes swimming in the mornings and the timing was not good for her.   All other systems were reviewed with the  patient and are negative.  MEDICAL HISTORY:  Past Medical History:  Diagnosis Date   Adjustment insomnia 02/23/2019   Breast cancer ([place]) 2018   Lumpectomy, chemotherapy and radiation with recurrence 2018. Now on Femara   Cervical radiculopathy 07/05/2019   COVID-19 02/2019   Her husband also passed away at this time from COVID-19.   Family history of breast cancer    Genetic testing 10/30/2016   Breast/GYN panel (23 genes) @ [facility] - No pathogenic mutations detected   GERD (gastroesophageal reflux disease)    OTC medication   Grief reaction 02/23/2019   Heart murmur    as a child - Echo in early 20s "ok"   History of echocardiogram 05/2015   Echo 4/17: EF 55-60%, GLS -19.6%, normal wall motion   History of exercise stress test 05/2015   ETT 4/17 - No ST changes   HLD (hyperlipidemia)    Multiple thyroid nodules 2009   Multiple nodules: Normal ultrasound reported at diagnosis   Osteoporosis    Personal history of chemotherapy    Personal history of radiation therapy    UNEQUAL LEG LENGTH, ACQUIRED 11/17/2006   Qualifier: Diagnosis of  By: Oneida Alar MD, KARL      SURGICAL HISTORY: Past Surgical History:  Procedure Laterality Date   ABDOMINAL HYSTERECTOMY     BREAST LUMPECTOMY Right 2003   BREAST RECONSTRUCTION WITH PLACEMENT OF TISSUE EXPANDER AND FLEX HD (ACELLULAR HYDRATED DERMIS) Bilateral 11/17/2016   Procedure: IMEDITATE BREAST RECONSTRUCTION WITH PLACEMENT OF TISSUE EXPANDER AND FLEX HD (ACELLULAR HYDRATED DERMIS);  Surgeon: Wallace Going, DO;  Location: [facility];  Service: Plastics;  Laterality: Bilateral;   BREAST SURGERY  2003 /right   lumpectomy; chemotherapy and radiation   LIPOSUCTION WITH LIPOFILLING Bilateral 02/23/2020   Procedure: Fat filling of breasts for symmetry from abdomen;  Surgeon: Wallace Going, DO;  Location: [facility];  Service: Plastics;  Laterality: Bilateral;  90 min, please   REMOVAL OF BILATERAL TISSUE EXPANDERS WITH  PLACEMENT OF BILATERAL BREAST IMPLANTS Bilateral 02/11/2017   Procedure: REMOVAL OF BILATERAL TISSUE EXPANDERS WITH PLACEMENT OF BILATERAL SILICONE BREAST IMPLANTS;  Surgeon: Wallace Going, DO;  Location: [facility];  Service: Plastics;  Laterality: Bilateral;   TOTAL ABDOMINAL HYSTERECTOMY W/ BILATERAL SALPINGOOPHORECTOMY     for treatment of breast treatment   TOTAL MASTECTOMY Bilateral 11/17/2016   Procedure: RIGHT MASTECTOMY AND LEFT PROPHYLATIC MASTECTOMY;  Surgeon: Jovita Kussmaul, MD;  Location: [facility];  Service: General;  Laterality: Bilateral;    I have reviewed the social history and family history with the patient and they are unchanged from previous note.  ALLERGIES:  has No Known Allergies.  MEDICATIONS:  Current Outpatient Medications  Medication Sig Dispense Refill   atorvastatin (LIPITOR) 20 MG tablet Take 1 tablet (20 mg total) by mouth daily. 90 tablet 3   benzonatate (TESSALON PERLES) 100 MG capsule Take 1 capsule (100 mg total) by mouth 3 (three) times daily  as needed. 20 capsule 0   Calcium 500 MG CHEW Chew by mouth.     cetirizine (ZYRTEC) 10 MG tablet Take 10 mg by mouth as needed.     Collagenase POWD by Does not apply route.     denosumab (PROLIA) 60 MG/ML SOLN injection Inject 60 mg into the skin every 6 (six) months. Administer in upper arm, thigh, or abdomen 1 Syringe 1   diclofenac (VOLTAREN) 75 MG EC tablet Take 1 tablet (75 mg total) by mouth 2 (two) times daily. 90 tablet 1   ELDERBERRY PO Take by mouth.     exemestane (AROMASIN) 25 MG tablet TAKE 1 TABLET (25 MG TOTAL) BY MOUTH DAILY AFTER BREAKFAST. 90 tablet 1   hydrochlorothiazide (HYDRODIURIL) 12.5 MG tablet Take 1 tablet (12.5 mg total) by mouth daily. 90 tablet 1   Omega-3 Fatty Acids (FISH OIL) 1200 MG CAPS      psyllium (METAMUCIL) 58.6 % powder Take 1 packet by mouth daily.     Vitamin D, Cholecalciferol, 25 MCG (1000 UT) CAPS Take 1 capsule by mouth daily.     No current  facility-administered medications for this visit.    PHYSICAL EXAMINATION: ECOG PERFORMANCE STATUS: 0 - Asymptomatic  Vitals:   11/26/20 1150  BP: (!) 141/92  Pulse: 72  Resp: 16  Temp: 98.5 F (36.9 C)  SpO2: 100%   Wt Readings from Last 3 Encounters:  11/26/20 154 lb 9.6 oz (70.1 kg)  08/10/20 155 lb (70.3 kg)  05/14/20 156 lb (70.8 kg)     GENERAL:alert, no distress and comfortable SKIN: skin color, texture, turgor are normal, no rashes or significant lesions EYES: normal, Conjunctiva are pink and non-injected, sclera clear  NECK: supple, thyroid normal size, non-tender, without nodularity LYMPH:  no palpable lymphadenopathy in the cervical, axillary  LUNGS: clear to auscultation and percussion with normal breathing effort HEART: regular rate & rhythm and no murmurs and no lower extremity edema ABDOMEN:abdomen soft, non-tender and normal bowel sounds Musculoskeletal:no cyanosis of digits and no clubbing  NEURO: alert & oriented x 3 with fluent speech, no focal motor/sensory deficits BREAST: No palpable mass, nodules or adenopathy bilaterally. Breast exam benign.   LABORATORY DATA:  I have reviewed the data as listed CBC Latest Ref Rng & Units 11/26/2020 05/07/2020 11/04/2019  WBC 4.0 - 10.5 K/uL 5.6 5.7 5.2  Hemoglobin 12.0 - 15.0 g/dL 14.5 14.8 13.8  Hematocrit 36.0 - 46.0 % 42.2 44.6 41.6  Platelets 150 - 400 K/uL 272 254 259     CMP Latest Ref Rng & Units 11/26/2020 05/07/2020 11/04/2019  Glucose 70 - 99 mg/dL 103(H) 105(H) 98  BUN 6 - 20 mg/dL 18 21(H) 23(H)  Creatinine 0.44 - 1.00 mg/dL 0.86 0.99 0.90  Sodium 135 - 145 mmol/L 140 143 139  Potassium 3.5 - 5.1 mmol/L 4.1 4.2 4.3  Chloride 98 - 111 mmol/L 105 107 106  CO2 22 - 32 mmol/L $RemoveB'27 23 25  'yspHaARh$ Calcium 8.9 - 10.3 mg/dL 9.7 9.3 9.7  Total Protein 6.5 - 8.1 g/dL 7.6 7.3 7.2  Total Bilirubin 0.3 - 1.2 mg/dL 0.9 0.5 1.0  Alkaline Phos 38 - 126 U/L 54 60 55  AST 15 - 41 U/L 31 29 33  ALT 0 - 44 U/L 39 38 49(H)       RADIOGRAPHIC STUDIES: I have personally reviewed the radiological images as listed and agreed with the findings in the report. No results found.    No orders of the defined  types were placed in this encounter.  All questions were answered. The patient knows to call the clinic with any problems, questions or concerns. No barriers to learning was detected. The total time spent in the appointment was 30 minutes.     Truitt Merle, MD 11/26/2020   I, Wilburn Mylar, am acting as scribe for Truitt Merle, MD.   I have reviewed the above documentation for accuracy and completeness, and I agree with the above.

## 2020-12-04 DIAGNOSIS — L821 Other seborrheic keratosis: Secondary | ICD-10-CM | POA: Diagnosis not present

## 2020-12-04 DIAGNOSIS — L814 Other melanin hyperpigmentation: Secondary | ICD-10-CM | POA: Diagnosis not present

## 2020-12-04 DIAGNOSIS — D225 Melanocytic nevi of trunk: Secondary | ICD-10-CM | POA: Diagnosis not present

## 2020-12-04 DIAGNOSIS — L578 Other skin changes due to chronic exposure to nonionizing radiation: Secondary | ICD-10-CM | POA: Diagnosis not present

## 2021-01-14 ENCOUNTER — Encounter: Payer: Self-pay | Admitting: Endocrinology

## 2021-01-14 ENCOUNTER — Other Ambulatory Visit: Payer: Self-pay

## 2021-01-14 ENCOUNTER — Ambulatory Visit (INDEPENDENT_AMBULATORY_CARE_PROVIDER_SITE_OTHER): Payer: BC Managed Care – PPO | Admitting: Endocrinology

## 2021-01-14 VITALS — BP 104/86 | HR 74 | Ht 64.0 in | Wt 146.6 lb

## 2021-01-14 DIAGNOSIS — E042 Nontoxic multinodular goiter: Secondary | ICD-10-CM | POA: Diagnosis not present

## 2021-01-14 NOTE — Progress Notes (Signed)
Patient ID: Olivia [name], female   DOB: 1960/07/06, 60 y.o.   MRN: [id number]          Reason for Appointment: Goiter, follow-up    History of Present Illness:   The patient's thyroid enlargement was first discovered around the year 1999  At that time she was referred to an endocrinologist  She was also told that she had mild hypothyroidism and was given levothyroxine for a couple of years She does not remember details of any further evaluation no previous ultrasound reports are available No previous biopsy has been done She had an ultrasound done by her PCP in 04/2017 showing multiple nodules and 2 of the nodules were relatively significant in characteristics.  Recent history:  She is here for her annual follow-up  Does not report any difficulty with swallowing also no neck pressure sensation or tightness Has not noticed any more swelling in her neck than before  Has labs done annually with her PCP TSH has been consistently normal  Lab Results  Component Value Date   FREET4 0.80 04/14/2017   FREET4 0.78 12/26/2014   TSH 2.97 05/14/2020   TSH 2.02 05/10/2019   TSH 2.40 04/22/2018    She has had periodic ultrasound exams as follows:  01/2019 ultrasound report:  1. Multinodular goiter  2. Thyroid nodules previously labeled #2 and #6 have been down graded to TR 3 thyroid nodules and an FNA is no longer recommended. 3. There are multiple thyroid nodules as detailed above that require further follow-up with ultrasound.  Given the are all TR 3 thyroid nodules, and have been stable for 1 year, a 2 year follow-up ultrasound is recommended.   Previous recommendations: 2. Nodules #1 and #3 have been down graded from TR4 nodules to TR3 nodules, however both still meet imaging criteria to recommend a 1 year follow-up as clinically indicated. 3. Nodules #2, 4 and 6 are unchanged however again all meet imaging criteria to recommend a 1 year follow-up.      Allergies as of  01/14/2021   No Known Allergies      Medication List        Accurate as of January 14, 2021  1:15 PM. If you have any questions, ask your nurse or doctor.          atorvastatin 20 MG tablet Commonly known as: LIPITOR Take 1 tablet (20 mg total) by mouth daily.   benzonatate 100 MG capsule Commonly known as: Tessalon Perles Take 1 capsule (100 mg total) by mouth 3 (three) times daily as needed.   Calcium 500 MG Chew Chew by mouth.   cetirizine 10 MG tablet Commonly known as: ZYRTEC Take 10 mg by mouth as needed.   Collagenase Powd by Does not apply route.   denosumab 60 MG/ML Soln injection Commonly known as: PROLIA Inject 60 mg into the skin every 6 (six) months. Administer in upper arm, thigh, or abdomen   diclofenac 75 MG EC tablet Commonly known as: VOLTAREN Take 1 tablet (75 mg total) by mouth 2 (two) times daily.   ELDERBERRY PO Take by mouth.   exemestane 25 MG tablet Commonly known as: AROMASIN TAKE 1 TABLET (25 MG TOTAL) BY MOUTH DAILY AFTER BREAKFAST.   Fish Oil 1200 MG Caps   hydrochlorothiazide 12.5 MG tablet Commonly known as: HYDRODIURIL Take 1 tablet (12.5 mg total) by mouth daily.   psyllium 58.6 % powder Commonly known as: METAMUCIL Take 1 packet by mouth daily.   Vitamin D (  Cholecalciferol) 25 MCG (1000 UT) Caps Take 1 capsule by mouth daily.        Allergies: No Known Allergies  Past Medical History:  Diagnosis Date   Adjustment insomnia 02/23/2019   Breast cancer ([place]) 2018   Lumpectomy, chemotherapy and radiation with recurrence 2018. Now on Femara   Cervical radiculopathy 07/05/2019   COVID-19 02/2019   Her husband also passed away at this time from COVID-19.   Family history of breast cancer    Genetic testing 10/30/2016   Breast/GYN panel (23 genes) @ [facility] - No pathogenic mutations detected   GERD (gastroesophageal reflux disease)    OTC medication   Grief reaction 02/23/2019   Heart murmur    as a child - Echo  in early 20s "ok"   History of echocardiogram 05/2015   Echo 4/17: EF 55-60%, GLS -19.6%, normal wall motion   History of exercise stress test 05/2015   ETT 4/17 - No ST changes   HLD (hyperlipidemia)    Multiple thyroid nodules 2009   Multiple nodules: Normal ultrasound reported at diagnosis   Osteoporosis    Personal history of chemotherapy    Personal history of radiation therapy    UNEQUAL LEG LENGTH, ACQUIRED 11/17/2006   Qualifier: Diagnosis of  By: Oneida Alar MD, KARL      Past Surgical History:  Procedure Laterality Date   ABDOMINAL HYSTERECTOMY     BREAST LUMPECTOMY Right 2003   BREAST RECONSTRUCTION WITH PLACEMENT OF TISSUE EXPANDER AND FLEX HD (ACELLULAR HYDRATED DERMIS) Bilateral 11/17/2016   Procedure: IMEDITATE BREAST RECONSTRUCTION WITH PLACEMENT OF TISSUE EXPANDER AND FLEX HD (ACELLULAR HYDRATED DERMIS);  Surgeon: Wallace Going, DO;  Location: [facility];  Service: Plastics;  Laterality: Bilateral;   BREAST SURGERY  2003 /right   lumpectomy; chemotherapy and radiation   LIPOSUCTION WITH LIPOFILLING Bilateral 02/23/2020   Procedure: Fat filling of breasts for symmetry from abdomen;  Surgeon: Wallace Going, DO;  Location: [facility];  Service: Plastics;  Laterality: Bilateral;  90 min, please   REMOVAL OF BILATERAL TISSUE EXPANDERS WITH PLACEMENT OF BILATERAL BREAST IMPLANTS Bilateral 02/11/2017   Procedure: REMOVAL OF BILATERAL TISSUE EXPANDERS WITH PLACEMENT OF BILATERAL SILICONE BREAST IMPLANTS;  Surgeon: Wallace Going, DO;  Location: [facility];  Service: Plastics;  Laterality: Bilateral;   TOTAL ABDOMINAL HYSTERECTOMY W/ BILATERAL SALPINGOOPHORECTOMY     for treatment of breast treatment   TOTAL MASTECTOMY Bilateral 11/17/2016   Procedure: RIGHT MASTECTOMY AND LEFT PROPHYLATIC MASTECTOMY;  Surgeon: Jovita Kussmaul, MD;  Location: [facility];  Service: General;  Laterality: Bilateral;    Family History  Problem Relation Age of Onset    Diabetes Other        1ST DEGREE RELATIVE   Coronary artery disease Mother        1ST DEGREE RELATIVE   Heart attack Mother 40   Skin cancer Mother    Diabetes Father    Kidney disease Father    Prostate cancer Father 73       deceased 53   Cancer Brother 37       multiple myeloma    Lung cancer Maternal Uncle        in 2 mat uncles   Breast cancer Paternal Aunt 62   Breast cancer Cousin        dx 57s; daughter of unaffected paternal aunt   Breast cancer Cousin        dx 28s; another daughter of unaffected paternal  aunt   Esophageal cancer Cousin        pat first cousin    Social History:  reports that she has never smoked. She has never used smokeless tobacco. She reports current alcohol use. She reports that she does not use drugs.    Review of Systems  Taking HCTZ for mild hypertension    Examination:   BP 104/86 (BP Location: Left Arm, Patient Position: Sitting, Cuff Size: Normal)   Pulse 74   Ht $R'5\' 4"'ug$  (1.626 m)   Wt 146 lb 9.6 oz (66.5 kg)   SpO2 98%   BMI 25.16 kg/m   She looks well  THYROID exam:  Neck circumference 35 cm over the thyroid area Right side of the thyroid is enlarged medially, firm and nodular and about 2 to 2.5 times normal  Left lobe is just palpable on swallowing, significantly enlarged, soft to firm and no nodule palpated  Has no lymphadenopathy in the neck area.          Assessment/Plan:  Multinodular goiter, long-standing with numerous nodules, mostly under 2 cm.   Has been euthyroid on annual thyroid function tests  Thyroid enlargement on the right side is unchanged with nodularity mostly in the medial part Left side is barely palpable now Neck circumference is smaller  Again has had a longstanding stable goiter  Clinically no increase in the size of the right lobe and the left lobe appears smaller With her overall clinical picture no indication for repeat ultrasound  She will have TSH done through her PCP annually  although do not expect this to be abnormal  She can follow-up every other year now  Elayne Snare 01/14/2021  Note: This office note was prepared with Dragon voice recognition system technology. Any transcriptional errors that result from this process are unintentional.

## 2021-01-31 ENCOUNTER — Other Ambulatory Visit: Payer: Self-pay | Admitting: Family Medicine

## 2021-02-02 DIAGNOSIS — C50912 Malignant neoplasm of unspecified site of left female breast: Secondary | ICD-10-CM | POA: Diagnosis not present

## 2021-02-02 DIAGNOSIS — C50911 Malignant neoplasm of unspecified site of right female breast: Secondary | ICD-10-CM | POA: Diagnosis not present

## 2021-02-27 ENCOUNTER — Other Ambulatory Visit: Payer: Self-pay

## 2021-02-28 ENCOUNTER — Encounter: Payer: Self-pay | Admitting: Family Medicine

## 2021-02-28 ENCOUNTER — Ambulatory Visit (INDEPENDENT_AMBULATORY_CARE_PROVIDER_SITE_OTHER): Payer: BC Managed Care – PPO | Admitting: Family Medicine

## 2021-02-28 VITALS — BP 118/70 | HR 67 | Temp 97.7°F | Ht 64.0 in | Wt 139.0 lb

## 2021-02-28 DIAGNOSIS — R194 Change in bowel habit: Secondary | ICD-10-CM

## 2021-02-28 DIAGNOSIS — K641 Second degree hemorrhoids: Secondary | ICD-10-CM | POA: Diagnosis not present

## 2021-02-28 NOTE — Progress Notes (Signed)
This visit occurred during the SARS-CoV-2 public health emergency.  Safety protocols were in place, including screening questions prior to the visit, additional usage of staff PPE, and extensive cleaning of exam room while observing appropriate contact time as indicated for disinfecting solutions.    Olivia [name] , 06-10-1960, 61 y.o., female MRN: [id number] Patient Care Team    Relationship Specialty Notifications Start End  Ma Hillock, DO PCP - General Family Medicine  12/26/14   Truitt Merle, MD Consulting Physician Hematology  10/15/16   Jovita Kussmaul, MD Consulting Physician General Surgery  10/15/16   Gery Pray, MD Consulting Physician Radiation Oncology  10/15/16   Sable Feil, MD Consulting Physician Gastroenterology  04/14/17   Molli Posey, MD Consulting Physician Obstetrics and Gynecology  04/14/17   Gardenia Phlegm, NP Nurse Practitioner Hematology and Oncology  07/08/17   Elayne Snare, MD Consulting Physician Endocrinology  04/22/18    Comment: thyroid nodules    Chief Complaint  Patient presents with   Hemorrhoids    Worsening in the last 2 weeks;      Subjective: Pt presents for an OV with complaints of worsening hemorrhoids of 2 weeks duration. She denies bleeding, drainage, fever or chills. She has used anusol products in the past, but feels coconut oil works better for her. She also reports she has had trouble with bowel changes, bloating and constipation. She would like referral to GI today. She I established with [facility], but her doctor is no longer there (Dr. Sharlett Iles). Colonoscopy 2014 normal.   Depression screen [facility] 2/9 02/28/2021 05/14/2020 05/10/2019 04/22/2018 04/14/2017  Decreased Interest 0 0 0 0 0  Down, Depressed, Hopeless 0 0 0 0 0  PHQ - 2 Score 0 0 0 0 0  Altered sleeping - - - 0 -  Tired, decreased energy - - - 0 -  Change in appetite - - - 0 -  Feeling bad or failure about yourself  - - - 0 -  Trouble concentrating - - - 0 -   Moving slowly or fidgety/restless - - - 0 -  Suicidal thoughts - - - 0 -  PHQ-9 Score - - - 0 -  Difficult doing work/chores - - - Not difficult at all -  Some recent data might be hidden    No Known Allergies Social History   Social History Narrative   Married. Bobby.   Children: none. No pets.    Stay at home.    Wears her seatbelt. Smoke alarm in the home.    Feels safe in her relationships.    Exercise 5x week.    Past Medical History:  Diagnosis Date   Adjustment insomnia 02/23/2019   Breast cancer ([place]) 2018   Lumpectomy, chemotherapy and radiation with recurrence 2018. Now on Femara   Cervical radiculopathy 07/05/2019   COVID-19 02/2019   Her husband also passed away at this time from COVID-19.   Family history of breast cancer    Genetic testing 10/30/2016   Breast/GYN panel (23 genes) @ [facility] - No pathogenic mutations detected   GERD (gastroesophageal reflux disease)    OTC medication   Grief reaction 02/23/2019   Heart murmur    as a child - Echo in early 20s "ok"   History of echocardiogram 05/2015   Echo 4/17: EF 55-60%, GLS -19.6%, normal wall motion   History of exercise stress test 05/2015   ETT 4/17 - No ST changes  HLD (hyperlipidemia)    Multiple thyroid nodules 2009   Multiple nodules: Normal ultrasound reported at diagnosis   Osteoporosis    Personal history of chemotherapy    Personal history of radiation therapy    UNEQUAL LEG LENGTH, ACQUIRED 11/17/2006   Qualifier: Diagnosis of  By: Oneida Alar MD, KARL     Past Surgical History:  Procedure Laterality Date   ABDOMINAL HYSTERECTOMY     BREAST LUMPECTOMY Right 2003   BREAST RECONSTRUCTION WITH PLACEMENT OF TISSUE EXPANDER AND FLEX HD (ACELLULAR HYDRATED DERMIS) Bilateral 11/17/2016   Procedure: IMEDITATE BREAST RECONSTRUCTION WITH PLACEMENT OF TISSUE EXPANDER AND FLEX HD (ACELLULAR HYDRATED DERMIS);  Surgeon: Wallace Going, DO;  Location: [facility];  Service: Plastics;  Laterality: Bilateral;    BREAST SURGERY  2003 /right   lumpectomy; chemotherapy and radiation   LIPOSUCTION WITH LIPOFILLING Bilateral 02/23/2020   Procedure: Fat filling of breasts for symmetry from abdomen;  Surgeon: Wallace Going, DO;  Location: [facility];  Service: Plastics;  Laterality: Bilateral;  90 min, please   REMOVAL OF BILATERAL TISSUE EXPANDERS WITH PLACEMENT OF BILATERAL BREAST IMPLANTS Bilateral 02/11/2017   Procedure: REMOVAL OF BILATERAL TISSUE EXPANDERS WITH PLACEMENT OF BILATERAL SILICONE BREAST IMPLANTS;  Surgeon: Wallace Going, DO;  Location: [facility];  Service: Plastics;  Laterality: Bilateral;   TOTAL ABDOMINAL HYSTERECTOMY W/ BILATERAL SALPINGOOPHORECTOMY     for treatment of breast treatment   TOTAL MASTECTOMY Bilateral 11/17/2016   Procedure: RIGHT MASTECTOMY AND LEFT PROPHYLATIC MASTECTOMY;  Surgeon: Autumn Messing III, MD;  Location: [facility];  Service: General;  Laterality: Bilateral;   Family History  Problem Relation Age of Onset   Diabetes Other        1ST DEGREE RELATIVE   Coronary artery disease Mother        1ST DEGREE RELATIVE   Heart attack Mother 64   Skin cancer Mother    Diabetes Father    Kidney disease Father    Prostate cancer Father 33       deceased 53   Cancer Brother 106       multiple myeloma    Lung cancer Maternal Uncle        in 2 mat uncles   Breast cancer Paternal Aunt 22   Breast cancer Cousin        dx 3s; daughter of unaffected paternal aunt   Breast cancer Cousin        dx 87s; another daughter of unaffected paternal aunt   Esophageal cancer Cousin        pat first cousin   Allergies as of 02/28/2021   No Known Allergies      Medication List        Accurate as of February 28, 2021  1:12 PM. If you have any questions, ask your nurse or doctor.          atorvastatin 20 MG tablet Commonly known as: LIPITOR Take 1 tablet (20 mg total) by mouth daily.   Calcium 500 MG Chew Chew by mouth.    Collagenase Powd by Does not apply route.   denosumab 60 MG/ML Soln injection Commonly known as: PROLIA Inject 60 mg into the skin every 6 (six) months. Administer in upper arm, thigh, or abdomen   diclofenac 75 MG EC tablet Commonly known as: VOLTAREN Take 1 tablet (75 mg total) by mouth 2 (two) times daily.   ELDERBERRY PO Take by mouth.   exemestane 25 MG tablet Commonly  known as: AROMASIN TAKE 1 TABLET (25 MG TOTAL) BY MOUTH DAILY AFTER BREAKFAST.   Fish Oil 1200 MG Caps   hydrochlorothiazide 12.5 MG tablet Commonly known as: HYDRODIURIL TAKE 1 TABLET BY MOUTH EVERY DAY   psyllium 58.6 % powder Commonly known as: METAMUCIL Take 1 packet by mouth daily.   Vitamin D (Cholecalciferol) 25 MCG (1000 UT) Caps Take 1 capsule by mouth daily.        All past medical history, surgical history, allergies, family history, immunizations andmedications were updated in the EMR today and reviewed under the history and medication portions of their EMR.     ROS Negative, with the exception of above mentioned in HPI   Objective:  BP 118/70    Pulse 67    Temp 97.7 F (36.5 C) (Oral)    Ht $R'5\' 4"'Gn$  (1.626 m)    Wt 139 lb (63 kg)    SpO2 99%    BMI 23.86 kg/m  Body mass index is 23.86 kg/m. Physical Exam Gen: Afebrile. No acute distress. Nontoxic in appearance, well developed, well nourished.  HENT: AT. [place].  Neuro: Normal gait. PERLA. EOMi. Alert. Oriented x3 . Psych: Normal affect, dress and demeanor. Normal speech. Normal thought content and judgment.  No results found. No results found. No results found for this or any previous visit (from the past 24 hour(s)).  Assessment/Plan: MYKAYLAH BALLMAN is a 61 y.o. female present for OV for  Grade II hemorrhoids/Bowel habit changes - sitz bath.  - monitor for infection or bleeding> be seen urgently if occurs.  - Ambulatory referral to Gastroenterology  Reviewed expectations re: course of current medical issues. Discussed  self-management of symptoms. Outlined signs and symptoms indicating need for more acute intervention. Patient verbalized understanding and all questions were answered. Patient received an After-Visit Summary.   Orders Placed This Encounter  Procedures   Ambulatory referral to Gastroenterology   No orders of the defined types were placed in this encounter.  Referral Orders         Ambulatory referral to Gastroenterology       Note is dictated utilizing voice recognition software. Although note has been proof read prior to signing, occasional typographical errors still can be missed. If any questions arise, please do not hesitate to call for verification.   electronically signed by:  Howard Pouch, DO  [place]

## 2021-02-28 NOTE — Patient Instructions (Addendum)
[facility]  Address:  [address], [place], [place] [address]  Phone: [phone]     Nonsurgical Procedures for Hemorrhoids Nonsurgical procedures can be used to treat hemorrhoids. Hemorrhoids are swollen veins that are inside the rectum (internal hemorrhoids) or around the anus (external hemorrhoids). They are caused by increased pressure in the anal area. This pressure may result from straining to have a bowel movement (constipation), diarrhea, pregnancy, obesity, anal sex, or sitting for long periods of time. Hemorrhoids can cause symptoms such as pain and bleeding. Various procedures may be done if diet changes, lifestyle changes, and other home treatments do not help your symptoms. Some of these procedures do not involve surgery. Tell a health care provider about: Any allergies you have. All medicines you are taking, including vitamins, herbs, eye drops, creams, and over-the-counter medicines. Any problems you or family members have had with anesthetic medicines. Any blood disorders you have. Any surgeries you have had. Any medical conditions you have. Whether you are pregnant or may be pregnant. What are the risks? Generally, this is a safe procedure. However, problems may occur, including: Infection. Bleeding. Pain. What happens before the procedure? Ask your health care provider about: Changing or stopping your regular medicines. This is especially important if you are taking diabetes medicines or blood thinners. Taking medicines such as aspirin and ibuprofen. These medicines can thin your blood. Do not take these medicines unless your health care provider tells you to take them. Taking over-the-counter medicines, vitamins, herbs, and supplements. Follow instructions from your health care provider about eating or drinking restrictions. You may need to have a procedure to examine the inside of your colon with a scope (colonoscopy). Your health care  provider may do this to make sure that there are no other causes for your bleeding or pain. What happens during the procedure?  Your health care provider will clean your rectal area with a rinsing solution. A lubricating jelly may be placed into your rectum. The jelly may contain a medicine to numb the area (local anesthetic). Your health care provider will insert a short scope (anoscope) into your rectum to examine the hemorrhoids. One of the following techniques will be used: Rubber band ligation. Your health care provider will place medical instruments through the scope to put rubber bands around the base of your hemorrhoids. The bands will cut off the blood supply to the hemorrhoids. The hemorrhoids will fall off after several days. Sclerotherapy. Your health care provider will inject medicine through the scope into your hemorrhoids. This will cause them to shrink and dry up. Infrared coagulation. Your health care provider will shine a type of light through the scope onto your hemorrhoids. This light will generate energy (infrared radiation). It will cause the hemorrhoids to scar and then fall off. Each of these procedures may vary among health care providers and hospitals. What happens after the procedure? You will be monitored to make sure that you have no bleeding. Return to your normal activities as told by your health care provider. Summary Hemorrhoids are swollen veins that are inside the rectum (internal hemorrhoids) or around the anus (external hemorrhoids). Nonsurgical procedures can be used to treat hemorrhoids. Rubber band ligation, sclerotherapy, or infrared coagulation may be used if dietary and lifestyle changes do not cause your hemorrhoids to go away. Before the procedure, ask your health care provider about changing or stopping your regular medicines. This information is not intended to replace advice given to you by your health care provider.  Make sure you discuss any  questions you have with your health care provider. Document Revised: 08/01/2020 Document Reviewed: 08/01/2020 Elsevier Patient Education  [place].

## 2021-03-01 ENCOUNTER — Other Ambulatory Visit: Payer: Self-pay | Admitting: Family Medicine

## 2021-03-05 DIAGNOSIS — C50912 Malignant neoplasm of unspecified site of left female breast: Secondary | ICD-10-CM | POA: Diagnosis not present

## 2021-03-18 ENCOUNTER — Ambulatory Visit: Payer: BC Managed Care – PPO | Admitting: Nurse Practitioner

## 2021-03-18 ENCOUNTER — Encounter: Payer: Self-pay | Admitting: Nurse Practitioner

## 2021-03-18 VITALS — BP 120/90 | HR 68 | Ht 63.39 in | Wt 139.2 lb

## 2021-03-18 DIAGNOSIS — K5909 Other constipation: Secondary | ICD-10-CM | POA: Diagnosis not present

## 2021-03-18 DIAGNOSIS — K644 Residual hemorrhoidal skin tags: Secondary | ICD-10-CM | POA: Diagnosis not present

## 2021-03-18 DIAGNOSIS — K648 Other hemorrhoids: Secondary | ICD-10-CM

## 2021-03-18 MED ORDER — HYDROCORTISONE (PERIANAL) 2.5 % EX CREA: 1.0000 "application " | TOPICAL_CREAM | Freq: Two times a day (BID) | CUTANEOUS | 0 refills | Status: AC

## 2021-03-18 NOTE — Patient Instructions (Signed)
RECOMMENDATION(S):  Anusol rectal cream, use twice a day for 10 days, this has been sent to the pharmacy. Recticare over the counter as needed. Glycerin rectal suppository at night as needed for constipation. This is over the counter. Contact us in 2 weeks with an update.    BMI:  If you are age 60 or older, your body mass index should be between 23-30. Your Body mass index is 24.37 kg/m. If this is out of the aforementioned range listed, please consider follow up with your Primary Care Provider.  If you are age 54 or younger, your body mass index should be between 19-25. Your Body mass index is 24.37 kg/m. If this is out of the aformentioned range listed, please consider follow up with your Primary Care Provider.   MY CHART:  The [facility] providers would like to encourage you to use [facility] to communicate with providers for non-urgent requests or questions.  Due to long hold times on the telephone, sending your provider a message by [facility] may be a faster and more efficient way to get a response.  Please allow 48 business hours for a response.  Please remember that this is for non-urgent requests.   Thank you for trusting me with your gastrointestinal care!    Tye Savoy, NP

## 2021-03-18 NOTE — Progress Notes (Deleted)
ASSESSMENT AND PLAN    # Colon cancer screening. Ten year screening colonoscopy due Jan 2024  HISTORY OF PRESENT ILLNESS     Chief Complaint :  Olivia [name] is a 61 y.o. female with a past medical history significant for GERD. See PMH below for any additional history.   [name] is known to Dr. Loletha Carrow, last seen in 2017  Data Reviewed:  CBC Latest Ref Rng & Units 11/26/2020 05/07/2020 11/04/2019  WBC 4.0 - 10.5 K/uL 5.6 5.7 5.2  Hemoglobin 12.0 - 15.0 g/dL 14.5 14.8 13.8  Hematocrit 36.0 - 46.0 % 42.2 44.6 41.6  Platelets 150 - 400 K/uL 272 254 259    Lab Results  Component Value Date   LIPASE 36.0 11/24/2011   CMP Latest Ref Rng & Units 11/26/2020 05/07/2020 11/04/2019  Glucose 70 - 99 mg/dL 103(H) 105(H) 98  BUN 6 - 20 mg/dL 18 21(H) 23(H)  Creatinine 0.44 - 1.00 mg/dL 0.86 0.99 0.90  Sodium 135 - 145 mmol/L 140 143 139  Potassium 3.5 - 5.1 mmol/L 4.1 4.2 4.3  Chloride 98 - 111 mmol/L 105 107 106  CO2 22 - 32 mmol/L $RemoveB'27 23 25  '[id number]$ Calcium 8.9 - 10.3 mg/dL 9.7 9.3 9.7  Total Protein 6.5 - 8.1 g/dL 7.6 7.3 7.2  Total Bilirubin 0.3 - 1.2 mg/dL 0.9 0.5 1.0  Alkaline Phos 38 - 126 U/L 54 60 55  AST 15 - 41 U/L 31 29 33  ALT 0 - 44 U/L 39 38 49(H)     PREVIOUS GI EVALUATIONS:   Colonoscopy 2014 --normal.     Past Medical History:  Diagnosis Date   Adjustment insomnia 02/23/2019   Breast cancer ([place]) 2018   Lumpectomy, chemotherapy and radiation with recurrence 2018. Now on Femara   Cervical radiculopathy 07/05/2019   COVID-19 02/2019   Her husband also passed away at this time from COVID-19.   Family history of breast cancer    Genetic testing 10/30/2016   Breast/GYN panel (23 genes) @ [facility] - No pathogenic mutations detected   GERD (gastroesophageal reflux disease)    OTC medication   Grief reaction 02/23/2019   Heart murmur    as a child - Echo in early 20s "ok"   History of echocardiogram 05/2015   Echo 4/17: EF 55-60%, GLS -19.6%, normal wall motion    History of exercise stress test 05/2015   ETT 4/17 - No ST changes   HLD (hyperlipidemia)    Multiple thyroid nodules 2009   Multiple nodules: Normal ultrasound reported at diagnosis   Osteoporosis    Personal history of chemotherapy    Personal history of radiation therapy    UNEQUAL LEG LENGTH, ACQUIRED 11/17/2006   Qualifier: Diagnosis of  By: Oneida Alar MD, KARL       Past Surgical History:  Procedure Laterality Date   ABDOMINAL HYSTERECTOMY     BREAST LUMPECTOMY Right 2003   BREAST RECONSTRUCTION WITH PLACEMENT OF TISSUE EXPANDER AND FLEX HD (ACELLULAR HYDRATED DERMIS) Bilateral 11/17/2016   Procedure: IMEDITATE BREAST RECONSTRUCTION WITH PLACEMENT OF TISSUE EXPANDER AND FLEX HD (ACELLULAR HYDRATED DERMIS);  Surgeon: Wallace Going, DO;  Location: [facility];  Service: Plastics;  Laterality: Bilateral;   BREAST SURGERY  2003 /right   lumpectomy; chemotherapy and radiation   LIPOSUCTION WITH LIPOFILLING Bilateral 02/23/2020   Procedure: Fat filling of breasts for symmetry from abdomen;  Surgeon: Wallace Going, DO;  Location: [facility];  Service: Plastics;  Laterality: Bilateral;  90 min, please   REMOVAL OF BILATERAL TISSUE EXPANDERS WITH PLACEMENT OF BILATERAL BREAST IMPLANTS Bilateral 02/11/2017   Procedure: REMOVAL OF BILATERAL TISSUE EXPANDERS WITH PLACEMENT OF BILATERAL SILICONE BREAST IMPLANTS;  Surgeon: Wallace Going, DO;  Location: [facility];  Service: Plastics;  Laterality: Bilateral;   TOTAL ABDOMINAL HYSTERECTOMY W/ BILATERAL SALPINGOOPHORECTOMY     for treatment of breast treatment   TOTAL MASTECTOMY Bilateral 11/17/2016   Procedure: RIGHT MASTECTOMY AND LEFT PROPHYLATIC MASTECTOMY;  Surgeon: Jovita Kussmaul, MD;  Location: [facility];  Service: General;  Laterality: Bilateral;   Family History  Problem Relation Age of Onset   Diabetes Other        1ST DEGREE RELATIVE   Coronary artery disease Mother        1ST DEGREE RELATIVE    Heart attack Mother 4   Skin cancer Mother    Diabetes Father    Kidney disease Father    Prostate cancer Father 3       deceased 43   Cancer Brother 20       multiple myeloma    Lung cancer Maternal Uncle        in 2 mat uncles   Breast cancer Paternal Aunt 21   Breast cancer Cousin        dx 56s; daughter of unaffected paternal aunt   Breast cancer Cousin        dx 20s; another daughter of unaffected paternal aunt   Esophageal cancer Cousin        pat first cousin   Social History   Tobacco Use   Smoking status: Never   Smokeless tobacco: Never  Vaping Use   Vaping Use: Never used  Substance Use Topics   Alcohol use: Yes    Alcohol/week: 0.0 standard drinks    Comment: Rare   Drug use: No   Current Outpatient Medications  Medication Sig Dispense Refill   atorvastatin (LIPITOR) 20 MG tablet Take 1 tablet (20 mg total) by mouth daily. 90 tablet 3   Calcium 500 MG CHEW Chew by mouth.     Collagenase POWD by Does not apply route.     denosumab (PROLIA) 60 MG/ML SOLN injection Inject 60 mg into the skin every 6 (six) months. Administer in upper arm, thigh, or abdomen 1 Syringe 1   diclofenac (VOLTAREN) 75 MG EC tablet Take 1 tablet (75 mg total) by mouth 2 (two) times daily. 90 tablet 1   ELDERBERRY PO Take by mouth.     exemestane (AROMASIN) 25 MG tablet TAKE 1 TABLET (25 MG TOTAL) BY MOUTH DAILY AFTER BREAKFAST. 90 tablet 1   hydrochlorothiazide (HYDRODIURIL) 12.5 MG tablet TAKE 1 TABLET BY MOUTH EVERY DAY 30 tablet 0   Omega-3 Fatty Acids (FISH OIL) 1200 MG CAPS      psyllium (METAMUCIL) 58.6 % powder Take 1 packet by mouth daily.     Vitamin D, Cholecalciferol, 25 MCG (1000 UT) CAPS Take 1 capsule by mouth daily.     No current facility-administered medications for this visit.   No Known Allergies   Review of Systems: All other systems reviewed and negative except where noted in HPI.    PHYSICAL EXAM :    Wt Readings from Last 3 Encounters:  03/18/21 139 lb  4 oz (63.2 kg)  02/28/21 139 lb (63 kg)  01/14/21 146 lb 9.6 oz (66.5 kg)    BP 120/90 (BP Location: Left Arm, Patient Position: Sitting, Cuff Size: Normal)  Pulse 68    Ht 5' 3.39" (1.61 m) Comment: height measured without shoes   Wt 139 lb 4 oz (63.2 kg)    BMI 24.37 kg/m  Constitutional:  Generally well appearing female in no acute distress. Psychiatric: Pleasant. Normal mood and affect. Behavior is normal. EENT: Pupils normal.  Conjunctivae are normal. No scleral icterus. Neck supple.  Cardiovascular: Normal rate, regular rhythm. No edema Pulmonary/chest: Effort normal and breath sounds normal. No wheezing, rales or rhonchi. Abdominal: Soft, nondistended, nontender. Bowel sounds active throughout. There are no masses palpable. No hepatomegaly. Rectal. Swollen , tender, external hemorrhoid anterior midline. Swollen internal hemorrhoids on anoscopy Neurological: Alert and oriented to person place and time. Skin: Skin is warm and dry. No rashes noted.  Tye Savoy, NP  03/18/2021, 10:04 AM  Cc:  Referring Provider Raoul Pitch, Renee A, DO

## 2021-03-18 NOTE — Progress Notes (Signed)
ASSESSMENT AND PLAN    # 61 yo female with longstanding hemorrhoid symptoms here with worsening perianal discomfort related to an external  hemorrhoid.  On anoscopy she was internal hemorrhoids as well.   --She inquires about banding.  I explained that banding is done only on internal hemorrhoids since they do not have pain causing nerve endings. Her perianal discomfort is coming from the swollen external hemorrhoid --Warm tub soaks 2-3 times daily --Trial of Anusol cream PR and perianally BID x 10 days.  --Recticare 2-3 times daily  to perianal area as needed for discomfort  --Manage constipation, see below --Call us in 2 weeks with an update  # Chronic constipation which she manages with fiber plus either senna or laxative tea.  --Can add glycerin suppositories as needed to avoid straining   # Colon cancer screening. Ten year screening colonoscopy due Jan 2024  HISTORY OF PRESENT ILLNESS     Chief Complaint : hemorrhoid discomfort  Olivia [name] is a 61 y.o. female with a past medical history significant for GERD, multinodular goiter, right breast cancer diagnosed in 2018 s/p bil mastectomy and reconstruction did not need chemo.  See PMH below for any additional history.   Olivia [name] is known to Dr. Loletha Carrow, last seen in 2017.   Referred by PCP for hemorrhoids, consideration of banding. Patient gives a history of chronic external hemorrhoid problems with discomfort and very occasional bleeding. Lately hemorrhoids have really been causing discomfort, worse with siting and also running. Coconut oil helps. She takes fiber, senna and / or laxative tea QOD. With this regimen she has a soft BM about every other day without significant straining. She mentions that stools have been more narrow over the last 2-3 years she .  She doesn't strain excessively  Data Reviewed:  CBC Latest Ref Rng & Units 11/26/2020 05/07/2020 11/04/2019  WBC 4.0 - 10.5 K/uL 5.6 5.7 5.2  Hemoglobin 12.0 - 15.0 g/dL  14.5 14.8 13.8  Hematocrit 36.0 - 46.0 % 42.2 44.6 41.6  Platelets 150 - 400 K/uL 272 254 259    Lab Results  Component Value Date   LIPASE 36.0 11/24/2011   CMP Latest Ref Rng & Units 11/26/2020 05/07/2020 11/04/2019  Glucose 70 - 99 mg/dL 103(H) 105(H) 98  BUN 6 - 20 mg/dL 18 21(H) 23(H)  Creatinine 0.44 - 1.00 mg/dL 0.86 0.99 0.90  Sodium 135 - 145 mmol/L 140 143 139  Potassium 3.5 - 5.1 mmol/L 4.1 4.2 4.3  Chloride 98 - 111 mmol/L 105 107 106  CO2 22 - 32 mmol/L $RemoveB'27 23 25  'LYxPQqcJ$ Calcium 8.9 - 10.3 mg/dL 9.7 9.3 9.7  Total Protein 6.5 - 8.1 g/dL 7.6 7.3 7.2  Total Bilirubin 0.3 - 1.2 mg/dL 0.9 0.5 1.0  Alkaline Phos 38 - 126 U/L 54 60 55  AST 15 - 41 U/L 31 29 33  ALT 0 - 44 U/L 39 38 49(H)     PREVIOUS GI EVALUATIONS:   Colonoscopy 2014 --normal.     Past Medical History:  Diagnosis Date   Adjustment insomnia 02/23/2019   Breast cancer ([place]) 2018   Lumpectomy, chemotherapy and radiation with recurrence 2018. Now on Femara   Cervical radiculopathy 07/05/2019   COVID-19 02/2019   Her husband also passed away at this time from COVID-19.   Family history of breast cancer    Genetic testing 10/30/2016   Breast/GYN panel (23 genes) @ [facility] - No pathogenic mutations detected   GERD (gastroesophageal reflux  disease)    OTC medication   Grief reaction 02/23/2019   Heart murmur    as a child - Echo in early 20s "ok"   History of echocardiogram 05/2015   Echo 4/17: EF 55-60%, GLS -19.6%, normal wall motion   History of exercise stress test 05/2015   ETT 4/17 - No ST changes   HLD (hyperlipidemia)    Multiple thyroid nodules 2009   Multiple nodules: Normal ultrasound reported at diagnosis   Osteoporosis    Personal history of chemotherapy    Personal history of radiation therapy    UNEQUAL LEG LENGTH, ACQUIRED 11/17/2006   Qualifier: Diagnosis of  By: Oneida Alar MD, KARL       Past Surgical History:  Procedure Laterality Date   ABDOMINAL HYSTERECTOMY     BREAST LUMPECTOMY  Right 2003   BREAST RECONSTRUCTION WITH PLACEMENT OF TISSUE EXPANDER AND FLEX HD (ACELLULAR HYDRATED DERMIS) Bilateral 11/17/2016   Procedure: IMEDITATE BREAST RECONSTRUCTION WITH PLACEMENT OF TISSUE EXPANDER AND FLEX HD (ACELLULAR HYDRATED DERMIS);  Surgeon: Wallace Going, DO;  Location: [place];  Service: Plastics;  Laterality: Bilateral;   BREAST SURGERY  2003 /right   lumpectomy; chemotherapy and radiation   LIPOSUCTION WITH LIPOFILLING Bilateral 02/23/2020   Procedure: Fat filling of breasts for symmetry from abdomen;  Surgeon: Wallace Going, DO;  Location: [place];  Service: Plastics;  Laterality: Bilateral;  90 min, please   REMOVAL OF BILATERAL TISSUE EXPANDERS WITH PLACEMENT OF BILATERAL BREAST IMPLANTS Bilateral 02/11/2017   Procedure: REMOVAL OF BILATERAL TISSUE EXPANDERS WITH PLACEMENT OF BILATERAL SILICONE BREAST IMPLANTS;  Surgeon: Wallace Going, DO;  Location: [place];  Service: Plastics;  Laterality: Bilateral;   TOTAL ABDOMINAL HYSTERECTOMY W/ BILATERAL SALPINGOOPHORECTOMY     for treatment of breast treatment   TOTAL MASTECTOMY Bilateral 11/17/2016   Procedure: RIGHT MASTECTOMY AND LEFT PROPHYLATIC MASTECTOMY;  Surgeon: Jovita Kussmaul, MD;  Location: [place];  Service: General;  Laterality: Bilateral;   Family History  Problem Relation Age of Onset   Diabetes Other        1ST DEGREE RELATIVE   Coronary artery disease Mother        1ST DEGREE RELATIVE   Heart attack Mother 18   Skin cancer Mother    Diabetes Father    Kidney disease Father    Prostate cancer Father 50       deceased 10   Cancer Brother 45       multiple myeloma    Lung cancer Maternal Uncle        in 2 mat uncles   Breast cancer Paternal Aunt 93   Breast cancer Cousin        dx 75s; daughter of unaffected paternal aunt   Breast cancer Cousin        dx 2s; another daughter of unaffected paternal aunt   Esophageal cancer Cousin        pat first  cousin   Social History   Tobacco Use   Smoking status: Never   Smokeless tobacco: Never  Vaping Use   Vaping Use: Never used  Substance Use Topics   Alcohol use: Yes    Alcohol/week: 0.0 standard drinks    Comment: Rare   Drug use: No   Current Outpatient Medications  Medication Sig Dispense Refill   atorvastatin (LIPITOR) 20 MG tablet Take 1 tablet (20 mg total) by mouth daily. 90 tablet 3   Calcium 500 MG CHEW Chew  by mouth.     Collagenase POWD by Does not apply route.     denosumab (PROLIA) 60 MG/ML SOLN injection Inject 60 mg into the skin every 6 (six) months. Administer in upper arm, thigh, or abdomen 1 Syringe 1   diclofenac (VOLTAREN) 75 MG EC tablet Take 1 tablet (75 mg total) by mouth 2 (two) times daily. 90 tablet 1   ELDERBERRY PO Take by mouth.     exemestane (AROMASIN) 25 MG tablet TAKE 1 TABLET (25 MG TOTAL) BY MOUTH DAILY AFTER BREAKFAST. 90 tablet 1   hydrochlorothiazide (HYDRODIURIL) 12.5 MG tablet TAKE 1 TABLET BY MOUTH EVERY DAY 30 tablet 0   Omega-3 Fatty Acids (FISH OIL) 1200 MG CAPS      psyllium (METAMUCIL) 58.6 % powder Take 1 packet by mouth daily.     Vitamin D, Cholecalciferol, 25 MCG (1000 UT) CAPS Take 1 capsule by mouth daily.     No current facility-administered medications for this visit.   No Known Allergies   Review of Systems: All systems reviewed and negative except where noted in HPI.    PHYSICAL EXAM :    Wt Readings from Last 3 Encounters:  03/18/21 139 lb 4 oz (63.2 kg)  02/28/21 139 lb (63 kg)  01/14/21 146 lb 9.6 oz (66.5 kg)    BP 120/90 (BP Location: Left Arm, Patient Position: Sitting, Cuff Size: Normal)    Pulse 68    Ht 5' 3.39" (1.61 m) Comment: height measured without shoes   Wt 139 lb 4 oz (63.2 kg)    BMI 24.37 kg/m  Constitutional:  Generally well appearing female in no acute distress. Psychiatric: Pleasant. Normal mood and affect. Behavior is normal. EENT: Pupils normal.  Conjunctivae are normal. No scleral  icterus. Neck supple.  Cardiovascular: Normal rate, regular rhythm. No edema Pulmonary/chest: Effort normal and breath sounds normal. No wheezing, rales or rhonchi. Abdominal: Soft, nondistended, nontender. Bowel sounds active throughout. There are no masses palpable. No hepatomegaly. Rectal: swollen, tender external hemorrhoid at anterior midline. On anoscopy there are swollen internal hemorrhoids.  Neurological: Alert and oriented to person place and time. Skin: Skin is warm and dry. No rashes noted.  Tye Savoy, NP  03/18/2021, 10:04 AM  Cc:  Referring Provider Howard Pouch, MD

## 2021-03-19 NOTE — Progress Notes (Signed)
____________________________________________________________  Attending physician addendum:  Thank you for sending this case to me. I have reviewed the entire note and agree with the plan.  If this does not respond to treatment plan as outlined, colorectal surgery evaluation warranted.  Wilfrid Lund, MD  ____________________________________________________________

## 2021-03-26 ENCOUNTER — Ambulatory Visit (INDEPENDENT_AMBULATORY_CARE_PROVIDER_SITE_OTHER): Payer: Self-pay | Admitting: Surgical

## 2021-03-26 ENCOUNTER — Ambulatory Visit: Payer: BC Managed Care – PPO | Admitting: Plastic Surgery

## 2021-03-26 ENCOUNTER — Encounter: Payer: Self-pay | Admitting: Plastic Surgery

## 2021-03-26 ENCOUNTER — Other Ambulatory Visit: Payer: Self-pay

## 2021-03-26 DIAGNOSIS — Z719 Counseling, unspecified: Secondary | ICD-10-CM

## 2021-03-26 DIAGNOSIS — Z170 Estrogen receptor positive status [ER+]: Secondary | ICD-10-CM

## 2021-03-26 DIAGNOSIS — C50912 Malignant neoplasm of unspecified site of left female breast: Secondary | ICD-10-CM | POA: Diagnosis not present

## 2021-03-26 DIAGNOSIS — C50211 Malignant neoplasm of upper-inner quadrant of right female breast: Secondary | ICD-10-CM | POA: Diagnosis not present

## 2021-03-26 NOTE — Progress Notes (Signed)
Preoperative Dx: Hyperpigmentation/sun damage  Postoperative Dx:  same  Procedure: laser to chest and neck  Anesthesia: none  Description of Procedure:  Risks and complications were explained to the patient. Consent was confirmed and signed. Time out was called and all information was confirmed to be correct. The area  area was prepped with alcohol and wiped dry. The BBL 516 mm laser was set at 8 J/cm and the neck and chest were lasered with 2 passes.  The BBL 560 nm laser was set at 7 J/cm and the neck and chest were lasered with 2 passes.  The patient tolerated the procedure well and there were no complications. The patient is to follow up in 6 weeks.

## 2021-03-26 NOTE — Progress Notes (Signed)
° °  Subjective:    Patient ID: Olivia [name], female    DOB: Jan 18, 1961, 61 y.o.   MRN: [id number]  The patient is a 61 year old female here for evaluation of her breasts.  She had a invasive ductal carcinoma of the right breast and underwent bilateral mastectomies 2018.  This was in 2018.  This was a recurrence from 2003 when she had a right lumpectomy and radiation.  In January 2019 she had the expanders removed and implants placed.  She has Mentor round high-profile gel 350 cc in each breast.  In January 2022 we did fat filling to try and get some better symmetry.  She has noticed a little bit of asymmetry where the fat has absorbed.  She is interested in improving the symmetry with fat grafting again.  The left breast is a little bit lower which could be raised.  This will likely go down again but we will talk with her further about whether or not she wants to try and tack it up.     Review of Systems  Constitutional: Negative.   HENT: Negative.    Eyes: Negative.   Respiratory: Negative.    Cardiovascular: Negative.   Gastrointestinal: Negative.   Endocrine: Negative.   Genitourinary: Negative.   Musculoskeletal: Negative.   Neurological: Negative.   Hematological: Negative.   Psychiatric/Behavioral: Negative.        Objective:   Physical Exam HENT:     Head: Normocephalic and atraumatic.  Cardiovascular:     Rate and Rhythm: Normal rate.     Pulses: Normal pulses.  Pulmonary:     Effort: Pulmonary effort is normal.  Skin:    General: Skin is warm.     Capillary Refill: Capillary refill takes less than 2 seconds.  Neurological:     Mental Status: She is alert and oriented to person, place, and time.  Psychiatric:        Mood and Affect: Mood normal.        Behavior: Behavior normal.        Thought Content: Thought content normal.        Judgment: Judgment normal.       Assessment & Plan:     ICD-10-CM   1. Malignant neoplasm of upper-inner quadrant of right breast  in female, estrogen receptor positive ([place])  C50.211    Z17.0       Plan for filling bilateral breasts and possible capsular repositioning of the left lateral breast.

## 2021-04-17 DIAGNOSIS — Z719 Counseling, unspecified: Secondary | ICD-10-CM

## 2021-04-29 NOTE — Progress Notes (Signed)
? ?  Patient ID: Olivia [name], female    DOB: November 03, 1960, 61 y.o.   MRN: [id number] ? ?Chief Complaint  ?Patient presents with  ? Pre-op Exam  ? ? ?  ICD-10-CM   ?1. Malignant neoplasm of upper-inner quadrant of right breast in female, estrogen receptor positive ([place])  C50.211   ? Z17.0   ?  ?2. Acquired absence of bilateral breasts and nipples  Z90.13   ?  ?3. Status post bilateral breast reconstruction  Z98.890   ?  ? ? ?History of Present Illness: ?Olivia [name] is a 61 y.o.  female  with a history of invasive ductal carcinoma of the right breast, status post bilateral mastectomies with bilateral breast reconstruction.  She presents for preoperative evaluation for upcoming procedure, liposuction for lipo filling of bilateral breasts and repositioning of the left breast capsule, scheduled for 05/16/2021 with Dr. Marla Roe. ? ?The patient has not had problems with anesthesia. No history of DVT/PE.  No family history of DVT/PE.  No family or personal history of bleeding or clotting disorders.  Patient is not currently taking any blood thinners.  No history of CVA/MI.  ? ?Summary of Previous Visit: She has Mentor round high profile gel 350 cc in each breast.  She previously had fat grafting to bilateral breast, however had some fat that absorbed resulting in some asymmetry.  Left breast is also slightly lower. ? ?PMH Significant for: Hypertension, hyperlipidemia, ?History of radiation to right breast. ? ?Patient is doing well today.  She reports she is hopeful that she will have better results from the surgery, her last fat grafting procedure resulted in poor take of the grafted fat.  She has not noticed any changes with her breast since her last appointment. ? ?Past Medical History: ?Allergies: ?No Known Allergies ? ?Current Medications: ? ?Current Outpatient Medications:  ?  atorvastatin (LIPITOR) 20 MG tablet, Take 1 tablet (20 mg total) by mouth daily., Disp: 90 tablet, Rfl: 3 ?  Calcium 500 MG CHEW, Chew by  mouth., Disp: , Rfl:  ?  Collagenase POWD, by Does not apply route., Disp: , Rfl:  ?  diclofenac (VOLTAREN) 75 MG EC tablet, Take 1 tablet (75 mg total) by mouth 2 (two) times daily., Disp: 90 tablet, Rfl: 1 ?  ELDERBERRY PO, Take by mouth., Disp: , Rfl:  ?  exemestane (AROMASIN) 25 MG tablet, TAKE 1 TABLET (25 MG TOTAL) BY MOUTH DAILY AFTER BREAKFAST., Disp: 90 tablet, Rfl: 1 ?  hydrochlorothiazide (HYDRODIURIL) 12.5 MG tablet, TAKE 1 TABLET BY MOUTH EVERY DAY, Disp: 30 tablet, Rfl: 0 ?  psyllium (METAMUCIL) 58.6 % powder, Take 1 packet by mouth daily., Disp: , Rfl:  ?  Vitamin D, Cholecalciferol, 25 MCG (1000 UT) CAPS, Take 1 capsule by mouth daily., Disp: , Rfl:  ? ?Past Medical Problems: ?Past Medical History:  ?Diagnosis Date  ? Adjustment insomnia 02/23/2019  ? Breast cancer ([place]) 2018  ? Lumpectomy, chemotherapy and radiation with recurrence 2018. Now on Femara  ? Cervical radiculopathy 07/05/2019  ? COVID-19 02/2019  ? Her husband also passed away at this time from COVID-12.  ? Family history of breast cancer   ? Genetic testing 10/30/2016  ? Breast/GYN panel (23 genes) @ [facility] - No pathogenic mutations detected  ? GERD (gastroesophageal reflux disease)   ? OTC medication  ? Grief reaction 02/23/2019  ? Heart murmur   ? as a child - Echo in early 20s "ok"  ? History of echocardiogram 05/2015  ?  Echo 4/17: EF 55-60%, GLS -19.6%, normal wall motion  ? History of exercise stress test 05/2015  ? ETT 4/17 - No ST changes  ? HLD (hyperlipidemia)   ? Multiple thyroid nodules 2009  ? Multiple nodules: Normal ultrasound reported at diagnosis  ? Osteoporosis   ? Personal history of chemotherapy   ? Personal history of radiation therapy   ? UNEQUAL LEG LENGTH, ACQUIRED 11/17/2006  ? Qualifier: Diagnosis of  By: Oneida Alar MD, KARL    ? ? ?Past Surgical History: ?Past Surgical History:  ?Procedure Laterality Date  ? ABDOMINAL HYSTERECTOMY    ? BREAST LUMPECTOMY Right 2003  ? BREAST RECONSTRUCTION WITH PLACEMENT OF TISSUE  EXPANDER AND FLEX HD (ACELLULAR HYDRATED DERMIS) Bilateral 11/17/2016  ? Procedure: IMEDITATE BREAST RECONSTRUCTION WITH PLACEMENT OF TISSUE EXPANDER AND FLEX HD (ACELLULAR HYDRATED DERMIS);  Surgeon: Wallace Going, DO;  Location: [facility];  Service: Plastics;  Laterality: Bilateral;  ? BREAST SURGERY  2003 /right  ? lumpectomy; chemotherapy and radiation  ? LIPOSUCTION WITH LIPOFILLING Bilateral 02/23/2020  ? Procedure: Fat filling of breasts for symmetry from abdomen;  Surgeon: Wallace Going, DO;  Location: [place];  Service: Plastics;  Laterality: Bilateral;  90 min, please  ? REMOVAL OF BILATERAL TISSUE EXPANDERS WITH PLACEMENT OF BILATERAL BREAST IMPLANTS Bilateral 02/11/2017  ? Procedure: REMOVAL OF BILATERAL TISSUE EXPANDERS WITH PLACEMENT OF BILATERAL SILICONE BREAST IMPLANTS;  Surgeon: Wallace Going, DO;  Location: [facility];  Service: Plastics;  Laterality: Bilateral;  ? TOTAL ABDOMINAL HYSTERECTOMY W/ BILATERAL SALPINGOOPHORECTOMY    ? for treatment of breast treatment  ? TOTAL MASTECTOMY Bilateral 11/17/2016  ? Procedure: RIGHT MASTECTOMY AND LEFT PROPHYLATIC MASTECTOMY;  Surgeon: Jovita Kussmaul, MD;  Location: [facility];  Service: General;  Laterality: Bilateral;  ? ? ?Social History: ?Social History  ? ?Socioeconomic History  ? Marital status: Widowed  ?  Spouse name: Not on file  ? Number of children: Not on file  ? Years of education: Not on file  ? Highest education level: Not on file  ?Occupational History  ? Not on file  ?Tobacco Use  ? Smoking status: Never  ? Smokeless tobacco: Never  ?Vaping Use  ? Vaping Use: Never used  ?Substance and Sexual Activity  ? Alcohol use: Yes  ?  Alcohol/week: 0.0 standard drinks  ?  Comment: Rare  ? Drug use: No  ? Sexual activity: Yes  ?  Birth control/protection: Surgical  ?Other Topics Concern  ? Not on file  ?Social History Narrative  ? Married. Bobby.  ? Children: none. No pets.   ? Stay at home.   ? Wears her  seatbelt. Smoke alarm in the home.   ? Feels safe in her relationships.   ? Exercise 5x week.   ? ?Social Determinants of Health  ? ?Financial Resource Strain: Not on file  ?Food Insecurity: Not on file  ?Transportation Needs: Not on file  ?Physical Activity: Not on file  ?Stress: Not on file  ?Social Connections: Not on file  ?Intimate Partner Violence: Not on file  ? ? ?Family History: ?Family History  ?Problem Relation Age of Onset  ? Diabetes Other   ?     1ST DEGREE RELATIVE  ? Coronary artery disease Mother   ?     1ST DEGREE RELATIVE  ? Heart attack Mother 22  ? Skin cancer Mother   ? Diabetes Father   ? Kidney disease Father   ? Prostate cancer  Father 41  ?     deceased 29  ? Cancer Brother 60  ?     multiple myeloma   ? Lung cancer Maternal Uncle   ?     in 2 mat uncles  ? Breast cancer Paternal Aunt 33  ? Breast cancer Cousin   ?     dx 7s; daughter of unaffected paternal aunt  ? Breast cancer Cousin   ?     dx 48s; another daughter of unaffected paternal aunt  ? Esophageal cancer Cousin   ?     pat first cousin  ? ? ?Review of Systems: ?Review of Systems  ?Constitutional: Negative.   ?Respiratory: Negative.    ?Cardiovascular: Negative.   ?Gastrointestinal: Negative.   ?Neurological: Negative.   ? ?Physical Exam: ?Vital Signs ?BP 129/86 (BP Location: Left Arm, Patient Position: Sitting, Cuff Size: Small)   Pulse 64   Ht $R'5\' 4"'UD$  (1.626 m)   Wt 137 lb 9.6 oz (62.4 kg)   SpO2 99%   BMI 23.62 kg/m?  ? ?Physical Exam  ?Constitutional:   ?   General: Not in acute distress. ?   Appearance: Normal appearance. Not ill-appearing.  ?HENT:  ?   Head: Normocephalic and atraumatic.  ?Eyes:  ?   Pupils: Pupils are equal, round ?Neck:  ?   Musculoskeletal: Normal range of motion.  ?Cardiovascular:  ?   Rate and Rhythm: Normal rate ?   Pulses: Normal pulses.  ?Pulmonary:  ?   Effort: Pulmonary effort is normal. No respiratory distress.  ?Musculoskeletal: Normal range of motion.  ?Skin: ?   General: Skin is warm and  dry.  ?   Findings: No erythema or rash.  ?Neurological:  ?   General: No focal deficit present.  ?   Mental Status: Alert and oriented to person, place, and time. Mental status is at baseline.  ?   Motor: No weakness

## 2021-04-29 NOTE — H&P (View-Only) (Signed)
? ?  Patient ID: Olivia [name], female    DOB: 01-19-1961, 61 y.o.   MRN: [id number] ? ?Chief Complaint  ?Patient presents with  ? Pre-op Exam  ? ? ?  ICD-10-CM   ?1. Malignant neoplasm of upper-inner quadrant of right breast in female, estrogen receptor positive ([place])  C50.211   ? Z17.0   ?  ?2. Acquired absence of bilateral breasts and nipples  Z90.13   ?  ?3. Status post bilateral breast reconstruction  Z98.890   ?  ? ? ?History of Present Illness: ?Olivia [name] is a 61 y.o.  female  with a history of invasive ductal carcinoma of the right breast, status post bilateral mastectomies with bilateral breast reconstruction.  She presents for preoperative evaluation for upcoming procedure, liposuction for lipo filling of bilateral breasts and repositioning of the left breast capsule, scheduled for 05/16/2021 with Dr. Marla Roe. ? ?The patient has not had problems with anesthesia. No history of DVT/PE.  No family history of DVT/PE.  No family or personal history of bleeding or clotting disorders.  Patient is not currently taking any blood thinners.  No history of CVA/MI.  ? ?Summary of Previous Visit: She has [place] round high profile gel 350 cc in each breast.  She previously had fat grafting to bilateral breast, however had some fat that absorbed resulting in some asymmetry.  Left breast is also slightly lower. ? ?PMH Significant for: Hypertension, hyperlipidemia, ?History of radiation to right breast. ? ?Patient is doing well today.  She reports she is hopeful that she will have better results from the surgery, her last fat grafting procedure resulted in poor take of the grafted fat.  She has not noticed any changes with her breast since her last appointment. ? ?Past Medical History: ?Allergies: ?No Known Allergies ? ?Current Medications: ? ?Current Outpatient Medications:  ?  atorvastatin (LIPITOR) 20 MG tablet, Take 1 tablet (20 mg total) by mouth daily., Disp: 90 tablet, Rfl: 3 ?  Calcium 500 MG CHEW, Chew by  mouth., Disp: , Rfl:  ?  Collagenase POWD, by Does not apply route., Disp: , Rfl:  ?  diclofenac (VOLTAREN) 75 MG EC tablet, Take 1 tablet (75 mg total) by mouth 2 (two) times daily., Disp: 90 tablet, Rfl: 1 ?  ELDERBERRY PO, Take by mouth., Disp: , Rfl:  ?  exemestane (AROMASIN) 25 MG tablet, TAKE 1 TABLET (25 MG TOTAL) BY MOUTH DAILY AFTER BREAKFAST., Disp: 90 tablet, Rfl: 1 ?  hydrochlorothiazide (HYDRODIURIL) 12.5 MG tablet, TAKE 1 TABLET BY MOUTH EVERY DAY, Disp: 30 tablet, Rfl: 0 ?  psyllium (METAMUCIL) 58.6 % powder, Take 1 packet by mouth daily., Disp: , Rfl:  ?  Vitamin D, Cholecalciferol, 25 MCG (1000 UT) CAPS, Take 1 capsule by mouth daily., Disp: , Rfl:  ? ?Past Medical Problems: ?Past Medical History:  ?Diagnosis Date  ? Adjustment insomnia 02/23/2019  ? Breast cancer ([place]) 2018  ? Lumpectomy, chemotherapy and radiation with recurrence 2018. Now on Femara  ? Cervical radiculopathy 07/05/2019  ? COVID-19 02/2019  ? Her husband also passed away at this time from COVID-54.  ? Family history of breast cancer   ? Genetic testing 10/30/2016  ? Breast/GYN panel (23 genes) @ [facility] - No pathogenic mutations detected  ? GERD (gastroesophageal reflux disease)   ? OTC medication  ? Grief reaction 02/23/2019  ? Heart murmur   ? as a child - Echo in early 20s "ok"  ? History of echocardiogram 05/2015  ?  Echo 4/17: EF 55-60%, GLS -19.6%, normal wall motion  ? History of exercise stress test 05/2015  ? ETT 4/17 - No ST changes  ? HLD (hyperlipidemia)   ? Multiple thyroid nodules 2009  ? Multiple nodules: Normal ultrasound reported at diagnosis  ? Osteoporosis   ? Personal history of chemotherapy   ? Personal history of radiation therapy   ? UNEQUAL LEG LENGTH, ACQUIRED 11/17/2006  ? Qualifier: Diagnosis of  By: Oneida Alar MD, KARL    ? ? ?Past Surgical History: ?Past Surgical History:  ?Procedure Laterality Date  ? ABDOMINAL HYSTERECTOMY    ? BREAST LUMPECTOMY Right 2003  ? BREAST RECONSTRUCTION WITH PLACEMENT OF TISSUE  EXPANDER AND FLEX HD (ACELLULAR HYDRATED DERMIS) Bilateral 11/17/2016  ? Procedure: IMEDITATE BREAST RECONSTRUCTION WITH PLACEMENT OF TISSUE EXPANDER AND FLEX HD (ACELLULAR HYDRATED DERMIS);  Surgeon: Wallace Going, DO;  Location: [facility];  Service: Plastics;  Laterality: Bilateral;  ? BREAST SURGERY  2003 /right  ? lumpectomy; chemotherapy and radiation  ? LIPOSUCTION WITH LIPOFILLING Bilateral 02/23/2020  ? Procedure: Fat filling of breasts for symmetry from abdomen;  Surgeon: Wallace Going, DO;  Location: [facility];  Service: Plastics;  Laterality: Bilateral;  90 min, please  ? REMOVAL OF BILATERAL TISSUE EXPANDERS WITH PLACEMENT OF BILATERAL BREAST IMPLANTS Bilateral 02/11/2017  ? Procedure: REMOVAL OF BILATERAL TISSUE EXPANDERS WITH PLACEMENT OF BILATERAL SILICONE BREAST IMPLANTS;  Surgeon: Wallace Going, DO;  Location: [facility];  Service: Plastics;  Laterality: Bilateral;  ? TOTAL ABDOMINAL HYSTERECTOMY W/ BILATERAL SALPINGOOPHORECTOMY    ? for treatment of breast treatment  ? TOTAL MASTECTOMY Bilateral 11/17/2016  ? Procedure: RIGHT MASTECTOMY AND LEFT PROPHYLATIC MASTECTOMY;  Surgeon: Jovita Kussmaul, MD;  Location: [facility];  Service: General;  Laterality: Bilateral;  ? ? ?Social History: ?Social History  ? ?Socioeconomic History  ? Marital status: Widowed  ?  Spouse name: Not on file  ? Number of children: Not on file  ? Years of education: Not on file  ? Highest education level: Not on file  ?Occupational History  ? Not on file  ?Tobacco Use  ? Smoking status: Never  ? Smokeless tobacco: Never  ?Vaping Use  ? Vaping Use: Never used  ?Substance and Sexual Activity  ? Alcohol use: Yes  ?  Alcohol/week: 0.0 standard drinks  ?  Comment: Rare  ? Drug use: No  ? Sexual activity: Yes  ?  Birth control/protection: Surgical  ?Other Topics Concern  ? Not on file  ?Social History Narrative  ? Married. Bobby.  ? Children: none. No pets.   ? Stay at home.   ? Wears her  seatbelt. Smoke alarm in the home.   ? Feels safe in her relationships.   ? Exercise 5x week.   ? ?Social Determinants of Health  ? ?Financial Resource Strain: Not on file  ?Food Insecurity: Not on file  ?Transportation Needs: Not on file  ?Physical Activity: Not on file  ?Stress: Not on file  ?Social Connections: Not on file  ?Intimate Partner Violence: Not on file  ? ? ?Family History: ?Family History  ?Problem Relation Age of Onset  ? Diabetes Other   ?     1ST DEGREE RELATIVE  ? Coronary artery disease Mother   ?     1ST DEGREE RELATIVE  ? Heart attack Mother 63  ? Skin cancer Mother   ? Diabetes Father   ? Kidney disease Father   ? Prostate cancer  Father 12  ?     deceased 41  ? Cancer Brother 38  ?     multiple myeloma   ? Lung cancer Maternal Uncle   ?     in 2 mat uncles  ? Breast cancer Paternal Aunt 27  ? Breast cancer Cousin   ?     dx 69s; daughter of unaffected paternal aunt  ? Breast cancer Cousin   ?     dx 50s; another daughter of unaffected paternal aunt  ? Esophageal cancer Cousin   ?     pat first cousin  ? ? ?Review of Systems: ?Review of Systems  ?Constitutional: Negative.   ?Respiratory: Negative.    ?Cardiovascular: Negative.   ?Gastrointestinal: Negative.   ?Neurological: Negative.   ? ?Physical Exam: ?Vital Signs ?BP 129/86 (BP Location: Left Arm, Patient Position: Sitting, Cuff Size: Small)   Pulse 64   Ht $R'5\' 4"'Of$  (1.626 m)   Wt 137 lb 9.6 oz (62.4 kg)   SpO2 99%   BMI 23.62 kg/m?  ? ?Physical Exam  ?Constitutional:   ?   General: Not in acute distress. ?   Appearance: Normal appearance. Not ill-appearing.  ?HENT:  ?   Head: Normocephalic and atraumatic.  ?Eyes:  ?   Pupils: Pupils are equal, round ?Neck:  ?   Musculoskeletal: Normal range of motion.  ?Cardiovascular:  ?   Rate and Rhythm: Normal rate ?   Pulses: Normal pulses.  ?Pulmonary:  ?   Effort: Pulmonary effort is normal. No respiratory distress.  ?Musculoskeletal: Normal range of motion.  ?Skin: ?   General: Skin is warm and  dry.  ?   Findings: No erythema or rash.  ?Neurological:  ?   General: No focal deficit present.  ?   Mental Status: Alert and oriented to person, place, and time. Mental status is at baseline.  ?   Motor: No weakness

## 2021-04-30 ENCOUNTER — Other Ambulatory Visit: Payer: Self-pay

## 2021-04-30 ENCOUNTER — Ambulatory Visit (INDEPENDENT_AMBULATORY_CARE_PROVIDER_SITE_OTHER): Payer: BC Managed Care – PPO | Admitting: Surgical

## 2021-04-30 ENCOUNTER — Encounter: Payer: Self-pay | Admitting: Surgical

## 2021-04-30 VITALS — BP 129/86 | HR 64 | Ht 64.0 in | Wt 137.6 lb

## 2021-04-30 DIAGNOSIS — C50211 Malignant neoplasm of upper-inner quadrant of right female breast: Secondary | ICD-10-CM

## 2021-04-30 DIAGNOSIS — Z9013 Acquired absence of bilateral breasts and nipples: Secondary | ICD-10-CM

## 2021-04-30 DIAGNOSIS — Z9889 Other specified postprocedural states: Secondary | ICD-10-CM

## 2021-04-30 DIAGNOSIS — Z170 Estrogen receptor positive status [ER+]: Secondary | ICD-10-CM

## 2021-05-03 DIAGNOSIS — Z853 Personal history of malignant neoplasm of breast: Secondary | ICD-10-CM | POA: Diagnosis not present

## 2021-05-03 DIAGNOSIS — R519 Headache, unspecified: Secondary | ICD-10-CM | POA: Diagnosis not present

## 2021-05-03 DIAGNOSIS — R079 Chest pain, unspecified: Secondary | ICD-10-CM | POA: Diagnosis not present

## 2021-05-03 DIAGNOSIS — R55 Syncope and collapse: Secondary | ICD-10-CM | POA: Diagnosis not present

## 2021-05-03 DIAGNOSIS — Z20822 Contact with and (suspected) exposure to covid-19: Secondary | ICD-10-CM | POA: Diagnosis not present

## 2021-05-03 DIAGNOSIS — R0789 Other chest pain: Secondary | ICD-10-CM | POA: Diagnosis not present

## 2021-05-03 DIAGNOSIS — R0600 Dyspnea, unspecified: Secondary | ICD-10-CM | POA: Diagnosis not present

## 2021-05-03 DIAGNOSIS — K219 Gastro-esophageal reflux disease without esophagitis: Secondary | ICD-10-CM | POA: Diagnosis not present

## 2021-05-06 ENCOUNTER — Encounter (HOSPITAL_BASED_OUTPATIENT_CLINIC_OR_DEPARTMENT_OTHER): Payer: Self-pay | Admitting: Plastic Surgery

## 2021-05-06 ENCOUNTER — Other Ambulatory Visit: Payer: Self-pay

## 2021-05-06 DIAGNOSIS — R55 Syncope and collapse: Secondary | ICD-10-CM | POA: Diagnosis not present

## 2021-05-06 DIAGNOSIS — Z20822 Contact with and (suspected) exposure to covid-19: Secondary | ICD-10-CM | POA: Diagnosis not present

## 2021-05-06 DIAGNOSIS — K219 Gastro-esophageal reflux disease without esophagitis: Secondary | ICD-10-CM | POA: Diagnosis not present

## 2021-05-06 DIAGNOSIS — R079 Chest pain, unspecified: Secondary | ICD-10-CM | POA: Diagnosis not present

## 2021-05-07 ENCOUNTER — Other Ambulatory Visit: Payer: BC Managed Care – PPO | Admitting: Surgical

## 2021-05-13 MED ORDER — ONDANSETRON HCL 4 MG PO TABS: 4.0000 mg | ORAL_TABLET | Freq: Three times a day (TID) | ORAL | 0 refills | Status: DC | PRN

## 2021-05-13 MED ORDER — CEPHALEXIN 500 MG PO CAPS: 500.0000 mg | ORAL_CAPSULE | Freq: Four times a day (QID) | ORAL | 0 refills | Status: AC

## 2021-05-15 ENCOUNTER — Encounter (HOSPITAL_BASED_OUTPATIENT_CLINIC_OR_DEPARTMENT_OTHER)
Admission: RE | Admit: 2021-05-15 | Discharge: 2021-05-15 | Disposition: A | Discharge status: Home / Self Care | Payer: BC Managed Care – PPO | Source: Ambulatory Visit | Attending: Plastic Surgery | Admitting: Plastic Surgery

## 2021-05-15 DIAGNOSIS — Z923 Personal history of irradiation: Secondary | ICD-10-CM | POA: Diagnosis not present

## 2021-05-15 DIAGNOSIS — N6489 Other specified disorders of breast: Secondary | ICD-10-CM | POA: Diagnosis not present

## 2021-05-15 DIAGNOSIS — Z9013 Acquired absence of bilateral breasts and nipples: Secondary | ICD-10-CM | POA: Diagnosis not present

## 2021-05-15 DIAGNOSIS — C50211 Malignant neoplasm of upper-inner quadrant of right female breast: Secondary | ICD-10-CM | POA: Diagnosis not present

## 2021-05-15 DIAGNOSIS — Z170 Estrogen receptor positive status [ER+]: Secondary | ICD-10-CM | POA: Diagnosis not present

## 2021-05-15 DIAGNOSIS — Z9889 Other specified postprocedural states: Secondary | ICD-10-CM | POA: Diagnosis not present

## 2021-05-15 DIAGNOSIS — I10 Essential (primary) hypertension: Secondary | ICD-10-CM | POA: Diagnosis not present

## 2021-05-15 LAB — BASIC METABOLIC PANEL
Anion gap: 6 (ref 5–15)
BUN: 20 mg/dL (ref 6–20)
CO2: 29 mmol/L (ref 22–32)
Calcium: 9.6 mg/dL (ref 8.9–10.3)
Chloride: 104 mmol/L (ref 98–111)
Creatinine, Ser: 1.01 mg/dL — ABNORMAL HIGH (ref 0.44–1.00)
GFR, Estimated: >60 mL/min (ref >60)
Glucose, Bld: 106 mg/dL — ABNORMAL HIGH (ref 70–99)
Potassium: 4.3 mmol/L (ref 3.5–5.1)
Sodium: 139 mmol/L (ref 135–145)

## 2021-05-16 ENCOUNTER — Encounter (HOSPITAL_BASED_OUTPATIENT_CLINIC_OR_DEPARTMENT_OTHER)
Admission: RE | Disposition: A | Discharge status: Home / Self Care | Payer: Self-pay | Source: Home / Self Care | Attending: Plastic Surgery

## 2021-05-16 ENCOUNTER — Other Ambulatory Visit: Payer: Self-pay

## 2021-05-16 ENCOUNTER — Encounter (HOSPITAL_BASED_OUTPATIENT_CLINIC_OR_DEPARTMENT_OTHER): Payer: Self-pay | Admitting: Plastic Surgery

## 2021-05-16 ENCOUNTER — Ambulatory Visit (HOSPITAL_BASED_OUTPATIENT_CLINIC_OR_DEPARTMENT_OTHER): Payer: BC Managed Care – PPO | Admitting: Anesthesiology

## 2021-05-16 ENCOUNTER — Ambulatory Visit (HOSPITAL_BASED_OUTPATIENT_CLINIC_OR_DEPARTMENT_OTHER)
Admission: RE | Admit: 2021-05-16 | Discharge: 2021-05-16 | Disposition: A | Discharge status: Home / Self Care | Payer: BC Managed Care – PPO | Attending: Plastic Surgery | Admitting: Plastic Surgery

## 2021-05-16 DIAGNOSIS — N6489 Other specified disorders of breast: Secondary | ICD-10-CM | POA: Insufficient documentation

## 2021-05-16 DIAGNOSIS — I10 Essential (primary) hypertension: Secondary | ICD-10-CM | POA: Diagnosis not present

## 2021-05-16 DIAGNOSIS — Z923 Personal history of irradiation: Secondary | ICD-10-CM | POA: Insufficient documentation

## 2021-05-16 DIAGNOSIS — Z170 Estrogen receptor positive status [ER+]: Secondary | ICD-10-CM | POA: Insufficient documentation

## 2021-05-16 DIAGNOSIS — Z9013 Acquired absence of bilateral breasts and nipples: Secondary | ICD-10-CM | POA: Diagnosis not present

## 2021-05-16 DIAGNOSIS — N651 Disproportion of reconstructed breast: Secondary | ICD-10-CM | POA: Diagnosis not present

## 2021-05-16 DIAGNOSIS — Z9889 Other specified postprocedural states: Secondary | ICD-10-CM | POA: Insufficient documentation

## 2021-05-16 DIAGNOSIS — C50211 Malignant neoplasm of upper-inner quadrant of right female breast: Secondary | ICD-10-CM | POA: Diagnosis not present

## 2021-05-16 HISTORY — PX: LIPOSUCTION WITH LIPOFILLING: SHX6436

## 2021-05-16 SURGERY — LIPOSUCTION, WITH FAT TRANSFER
Anesthesia: General | Site: Breast | Laterality: Bilateral

## 2021-05-16 MED ORDER — FENTANYL CITRATE (PF) 100 MCG/2ML IJ SOLN: 25.0000 ug | INTRAMUSCULAR | Status: DC | PRN

## 2021-05-16 MED ORDER — CEFAZOLIN SODIUM-DEXTROSE 2-4 GM/100ML-% IV SOLN
INTRAVENOUS | Status: AC
  Filled 2021-05-16: qty 100

## 2021-05-16 MED ORDER — ACETAMINOPHEN 325 MG RE SUPP: 650.0000 mg | RECTAL | Status: DC | PRN

## 2021-05-16 MED ORDER — ACETAMINOPHEN 500 MG PO TABS
ORAL_TABLET | ORAL | Status: AC
  Filled 2021-05-16: qty 2

## 2021-05-16 MED ORDER — LIDOCAINE HCL 1 % IJ SOLN
INTRAVENOUS | Status: DC | PRN
  Administered 2021-05-16: 700 mL

## 2021-05-16 MED ORDER — ONDANSETRON HCL 4 MG/2ML IJ SOLN
INTRAMUSCULAR | Status: AC
  Filled 2021-05-16: qty 2

## 2021-05-16 MED ORDER — FENTANYL CITRATE (PF) 100 MCG/2ML IJ SOLN
INTRAMUSCULAR | Status: DC | PRN
  Administered 2021-05-16 (×2): 50 ug via INTRAVENOUS

## 2021-05-16 MED ORDER — OXYCODONE HCL 5 MG/5ML PO SOLN: 5.0000 mg | Freq: Once | ORAL | Status: DC | PRN

## 2021-05-16 MED ORDER — LIDOCAINE HCL (CARDIAC) PF 100 MG/5ML IV SOSY
PREFILLED_SYRINGE | INTRAVENOUS | Status: DC | PRN
  Administered 2021-05-16: 60 mg via INTRAVENOUS

## 2021-05-16 MED ORDER — CEFAZOLIN SODIUM-DEXTROSE 2-4 GM/100ML-% IV SOLN
2.0000 g | INTRAVENOUS | Status: AC
  Administered 2021-05-16: 2 g via INTRAVENOUS

## 2021-05-16 MED ORDER — EPHEDRINE 5 MG/ML INJ
INTRAVENOUS | Status: AC
  Filled 2021-05-16: qty 5

## 2021-05-16 MED ORDER — CHLORHEXIDINE GLUCONATE CLOTH 2 % EX PADS: 6.0000 | MEDICATED_PAD | Freq: Once | CUTANEOUS | Status: DC

## 2021-05-16 MED ORDER — DEXAMETHASONE SODIUM PHOSPHATE 10 MG/ML IJ SOLN
INTRAMUSCULAR | Status: AC
  Filled 2021-05-16: qty 1

## 2021-05-16 MED ORDER — FENTANYL CITRATE (PF) 100 MCG/2ML IJ SOLN
INTRAMUSCULAR | Status: AC
Start: 2021-05-16 — End: ?
  Filled 2021-05-16: qty 2

## 2021-05-16 MED ORDER — MIDAZOLAM HCL 2 MG/2ML IJ SOLN
INTRAMUSCULAR | Status: AC
  Filled 2021-05-16: qty 2

## 2021-05-16 MED ORDER — MIDAZOLAM HCL 5 MG/5ML IJ SOLN
INTRAMUSCULAR | Status: DC | PRN
  Administered 2021-05-16: 2 mg via INTRAVENOUS

## 2021-05-16 MED ORDER — ACETAMINOPHEN 325 MG PO TABS: 650.0000 mg | ORAL_TABLET | ORAL | Status: DC | PRN

## 2021-05-16 MED ORDER — LACTATED RINGERS IV SOLN
INTRAVENOUS | Status: DC
Start: 2021-05-16 — End: 2021-05-16

## 2021-05-16 MED ORDER — DEXAMETHASONE SODIUM PHOSPHATE 4 MG/ML IJ SOLN
INTRAMUSCULAR | Status: DC | PRN
  Administered 2021-05-16: 10 mg via INTRAVENOUS

## 2021-05-16 MED ORDER — LIDOCAINE 2% (20 MG/ML) 5 ML SYRINGE
INTRAMUSCULAR | Status: AC
  Filled 2021-05-16: qty 5

## 2021-05-16 MED ORDER — OXYCODONE HCL 5 MG PO TABS: 5.0000 mg | ORAL_TABLET | Freq: Once | ORAL | Status: DC | PRN

## 2021-05-16 MED ORDER — OXYCODONE HCL 5 MG PO TABS: 5.0000 mg | ORAL_TABLET | ORAL | Status: DC | PRN

## 2021-05-16 MED ORDER — PHENYLEPHRINE 40 MCG/ML (10ML) SYRINGE FOR IV PUSH (FOR BLOOD PRESSURE SUPPORT)
PREFILLED_SYRINGE | INTRAVENOUS | Status: AC
  Filled 2021-05-16: qty 10

## 2021-05-16 MED ORDER — ACETAMINOPHEN 500 MG PO TABS
1000.0000 mg | ORAL_TABLET | Freq: Once | ORAL | Status: AC
  Administered 2021-05-16: 1000 mg via ORAL

## 2021-05-16 MED ORDER — PROPOFOL 10 MG/ML IV BOLUS
INTRAVENOUS | Status: DC | PRN
  Administered 2021-05-16: 150 mg via INTRAVENOUS

## 2021-05-16 MED ORDER — SUCCINYLCHOLINE CHLORIDE 200 MG/10ML IV SOSY
PREFILLED_SYRINGE | INTRAVENOUS | Status: AC
  Filled 2021-05-16: qty 10

## 2021-05-16 MED ORDER — SODIUM CHLORIDE 0.9% FLUSH: 3.0000 mL | INTRAVENOUS | Status: DC | PRN

## 2021-05-16 MED ORDER — ONDANSETRON HCL 4 MG/2ML IJ SOLN
INTRAMUSCULAR | Status: DC | PRN
  Administered 2021-05-16: 4 mg via INTRAVENOUS

## 2021-05-16 MED ORDER — SODIUM CHLORIDE 0.9% FLUSH: 3.0000 mL | Freq: Two times a day (BID) | INTRAVENOUS | Status: DC

## 2021-05-16 MED ORDER — SODIUM CHLORIDE 0.9 % IV SOLN: 250.0000 mL | INTRAVENOUS | Status: DC | PRN

## 2021-05-16 SURGICAL SUPPLY — 68 items
ADH SKN CLS APL DERMABOND .7 (GAUZE/BANDAGES/DRESSINGS) ×4
BAG DECANTER FOR FLEXI CONT (MISCELLANEOUS) ×4 IMPLANT
BINDER ABDOMINAL  9 SM 30-45 (SOFTGOODS) ×3
BINDER ABDOMINAL 10 UNV 27-48 (MISCELLANEOUS) IMPLANT
BINDER ABDOMINAL 12 SM 30-45 (SOFTGOODS) IMPLANT
BINDER ABDOMINAL 9 SM 30-45 (SOFTGOODS) ×1 IMPLANT
BINDER BREAST LRG (GAUZE/BANDAGES/DRESSINGS) ×2 IMPLANT
BINDER BREAST MEDIUM (GAUZE/BANDAGES/DRESSINGS) IMPLANT
BINDER BREAST XLRG (GAUZE/BANDAGES/DRESSINGS) IMPLANT
BINDER BREAST XXLRG (GAUZE/BANDAGES/DRESSINGS) IMPLANT
BLADE HEX COATED 2.75 (ELECTRODE) ×4 IMPLANT
BLADE SURG 15 STRL LF DISP TIS (BLADE) ×6 IMPLANT
BLADE SURG 15 STRL SS (BLADE) ×6
BNDG GAUZE ELAST 4 BULKY (GAUZE/BANDAGES/DRESSINGS) ×4 IMPLANT
CANISTER SUCT 1200ML W/VALVE (MISCELLANEOUS) ×4 IMPLANT
COVER BACK TABLE 60X90IN (DRAPES) ×4 IMPLANT
COVER MAYO STAND STRL (DRAPES) ×4 IMPLANT
DERMABOND ADVANCED (GAUZE/BANDAGES/DRESSINGS) ×2
DERMABOND ADVANCED .7 DNX12 (GAUZE/BANDAGES/DRESSINGS) ×4 IMPLANT
DRAIN CHANNEL 19F RND (DRAIN) IMPLANT
DRAPE LAPAROSCOPIC ABDOMINAL (DRAPES) ×4 IMPLANT
DRSG PAD ABDOMINAL 8X10 ST (GAUZE/BANDAGES/DRESSINGS) ×8 IMPLANT
ELECT BLADE 4.0 EZ CLEAN MEGAD (MISCELLANEOUS)
ELECT BLADE 6.5 EXT (BLADE) IMPLANT
ELECT REM PT RETURN 9FT ADLT (ELECTROSURGICAL) ×3
ELECTRODE BLDE 4.0 EZ CLN MEGD (MISCELLANEOUS) IMPLANT
ELECTRODE REM PT RTRN 9FT ADLT (ELECTROSURGICAL) ×3 IMPLANT
EVACUATOR SILICONE 100CC (DRAIN) IMPLANT
EXTRACTOR CANIST REVOLVE STRL (CANNISTER) ×4 IMPLANT
GLOVE BIO SURGEON STRL SZ 6.5 (GLOVE) ×14 IMPLANT
GOWN STRL REUS W/ TWL LRG LVL3 (GOWN DISPOSABLE) ×8 IMPLANT
GOWN STRL REUS W/TWL LRG LVL3 (GOWN DISPOSABLE) ×12
IV LACTATED RINGERS 1000ML (IV SOLUTION) ×8 IMPLANT
KIT FILL ASEPTIC TRANSFER (MISCELLANEOUS) IMPLANT
LINER CANISTER 1000CC FLEX (MISCELLANEOUS) ×6 IMPLANT
NDL HYPO 25X1 1.5 SAFETY (NEEDLE) ×2 IMPLANT
NEEDLE HYPO 25X1 1.5 SAFETY (NEEDLE) ×3 IMPLANT
NS IRRIG 1000ML POUR BTL (IV SOLUTION) ×4 IMPLANT
PACK BASIN DAY SURGERY FS (CUSTOM PROCEDURE TRAY) ×4 IMPLANT
PAD ALCOHOL SWAB (MISCELLANEOUS) ×4 IMPLANT
PENCIL SMOKE EVACUATOR (MISCELLANEOUS) ×2 IMPLANT
PIN SAFETY STERILE (MISCELLANEOUS) IMPLANT
SLEEVE SCD COMPRESS KNEE MED (STOCKING) ×4 IMPLANT
SPIKE FLUID TRANSFER (MISCELLANEOUS) IMPLANT
SPONGE T-LAP 18X18 ~~LOC~~+RFID (SPONGE) ×8 IMPLANT
STAPLER VISISTAT 35W (STAPLE) IMPLANT
STRIP CLOSURE SKIN 1/2X4 (GAUZE/BANDAGES/DRESSINGS) IMPLANT
STRIP SUTURE WOUND CLOSURE 1/2 (MISCELLANEOUS) IMPLANT
SUT MNCRL AB 4-0 PS2 18 (SUTURE) ×6 IMPLANT
SUT MON AB 3-0 SH 27 (SUTURE) ×6
SUT MON AB 3-0 SH27 (SUTURE) ×4 IMPLANT
SUT MON AB 5-0 PS2 18 (SUTURE) ×6 IMPLANT
SUT VIC AB 3-0 SH 27 (SUTURE)
SUT VIC AB 3-0 SH 27X BRD (SUTURE) IMPLANT
SUT VICRYL 4-0 PS2 18IN ABS (SUTURE) IMPLANT
SYR 10ML LL (SYRINGE) ×20 IMPLANT
SYR 3ML 18GX1 1/2 (SYRINGE) IMPLANT
SYR 50ML LL SCALE MARK (SYRINGE) ×4 IMPLANT
SYR BULB IRRIG 60ML STRL (SYRINGE) ×4 IMPLANT
SYR CONTROL 10ML LL (SYRINGE) ×4 IMPLANT
SYR TOOMEY 50ML (SYRINGE) ×2 IMPLANT
TOWEL GREEN STERILE FF (TOWEL DISPOSABLE) ×8 IMPLANT
TRAY DSU PREP LF (CUSTOM PROCEDURE TRAY) ×4 IMPLANT
TUBE CONNECTING 20X1/4 (TUBING) ×4 IMPLANT
TUBING INFILTRATION IT-10001 (TUBING) ×4 IMPLANT
TUBING SET GRADUATE ASPIR 12FT (MISCELLANEOUS) ×4 IMPLANT
UNDERPAD 30X36 HEAVY ABSORB (UNDERPADS AND DIAPERS) ×8 IMPLANT
YANKAUER SUCT BULB TIP NO VENT (SUCTIONS) ×2 IMPLANT

## 2021-05-16 NOTE — Interval H&P Note (Signed)
History and Physical Interval Note: ? ?05/16/2021 ?12:41 PM ? ?Olivia [name]  has presented today for surgery, with the diagnosis of Malignant neoplasm of upper inner quadrant of right breast.  The various methods of treatment have been discussed with the patient and family. After consideration of risks, benefits and other options for treatment, the patient has consented to  Procedure(s) with comments: ?LIPOSUCTION WITH LIPOFILLING (Bilateral) - 1.5 hours ?CAPSULE REPOSITIONING (Left) as a surgical intervention.  The patient's history has been reviewed, patient examined, no change in status, stable for surgery.  I have reviewed the patient's chart and labs.  Questions were answered to the patient's satisfaction.   ? ? ?[name] ? ? ?

## 2021-05-16 NOTE — Anesthesia Procedure Notes (Signed)
Procedure Name: LMA Insertion ?Date/Time: 05/16/2021 1:17 PM ?Performed by: Willa Frater, CRNA ?Pre-anesthesia Checklist: Patient identified, Emergency Drugs available, Suction available and Patient being monitored ?Patient Re-evaluated:Patient Re-evaluated prior to induction ?Oxygen Delivery Method: Circle system utilized ?Preoxygenation: Pre-oxygenation with 100% oxygen ?Induction Type: IV induction ?Ventilation: Mask ventilation without difficulty ?LMA: LMA inserted ?LMA Size: 4.0 ?Number of attempts: 1 ?Airway Equipment and Method: Bite block ?Placement Confirmation: positive ETCO2 ?Tube secured with: Tape ?Dental Injury: Teeth and Oropharynx as per pre-operative assessment  ? ? ? ? ?

## 2021-05-16 NOTE — Op Note (Signed)
DATE OF OPERATION: 05/16/2021 ? ?LOCATION: [facility] ? ?PREOPERATIVE DIAGNOSIS: breast asymmetry after reconstruction ? ?POSTOPERATIVE DIAGNOSIS: Same ? ?PROCEDURE: Lipofilling of left breast for symmetry 100 cc ? ?SURGEON: Cheyanna Strick Sanger Cicley Ganesh, DO ? ?ASSISTANT: Roetta Sessions, PA ? ?EBL: none ? ?CONDITION: Stable ? ?COMPLICATIONS: None ? ?INDICATION: The patient, Olivia [name], is a 61 y.o. female born on 10/07/1960, is here for treatment of breast asymmetry after mastectomy and reconstruction.  ? ?PROCEDURE DETAILS:  ?The patient was seen prior to surgery and marked.  The IV antibiotics were given. The patient was taken to the operating room and given a general anesthetic. A standard time out was performed and all information was confirmed by those in the room. SCDs were placed.   The abdomen and breast were prepped and draped.  Local was injected at the umbilicus.  The tumescent was infused into the abdominal fat.  After waiting several minutes the adipose was harvested.  The collection cannister was the Revolve. The adipose was prepared according to the manufacture guidelines.  The adipose was than injected into the left breast using the 10 cc syringe. A total of 100 cc was collected and distributed into the left medial and superior breast through a 2 mm incision in the medial breast.  The incisions were closed with the 5-0 Monocryl. The patient was allowed to wake up and taken to recovery room in stable condition at the end of the case. The family was notified at the end of the case.  ? ?The advanced practice practitioner (APP) assisted throughout the case.  The APP was essential in retraction and counter traction when needed to make the case progress smoothly.  This retraction and assistance made it possible to see the tissue plans for the procedure.  The assistance was needed for blood control, tissue re-approximation and assisted with closure of the incision site. ? ?

## 2021-05-16 NOTE — Discharge Instructions (Addendum)
INSTRUCTIONS FOR AFTER BREAST SURGERY   You will likely have some questions about what to expect following your operation.  The following information will help you and your family understand what to expect when you are discharged from the hospital.  Following these guidelines will help ensure a smooth recovery and reduce risks of complications.  Postoperative instructions include information on: diet, wound care, medications and physical activity.  AFTER SURGERY Expect to go home after the procedure.  In some cases, you may need to spend one night in the hospital for observation.  DIET Breast surgery does not require a specific diet.  However, the healthier you eat the better your body can start healing. It is important to increasing your protein intake.  This means limiting the foods with sugar and carbohydrates.  Focus on vegetables and some meat.  If you have any liposuction during your procedure be sure to drink water.  If your urine is bright yellow, then it is concentrated, and you need to drink more water.  As a general rule after surgery, you should have 8 ounces of water every hour while awake.  If you find you are persistently nauseated or unable to take in liquids let us know.  NO TOBACCO USE or EXPOSURE.  This will slow your healing process and increase the risk of a wound.  WOUND CARE Leave the ACE wrap or binder on for 3 days . Use fragrance free soap.   After 3 days you can remove the ACE wrap or binder to shower. Once dry apply ACE wrap, binder or sports bra.  Use a mild soap like Dial, Dove and Ivory. You may have Topifoam or Lipofoam on.  It is soft and spongy and helps keep you from getting creases if you have liposuction.  This can be removed before the shower and then replaced.  If you need more it is available on [employer] (Lipofoam). If you have steri-strips / tape directly attached to your skin leave them in place. It is OK to get these wet.   No baths, pools or hot tubs for four  weeks. We close your incision to leave the smallest and best-looking scar. No ointment or creams on your incisions until given the go ahead.  Especially not Neosporin (Too many skin reactions with this one).  A few weeks after surgery you can use Mederma and start massaging the scar. We ask you to wear your binder or sports bra for the first 6 weeks around the clock, including while sleeping. This provides added comfort and helps reduce the fluid accumulation at the surgery site.  ACTIVITY No heavy lifting until cleared by the doctor.  This usually means no more than a half-gallon of milk.  It is OK to walk and climb stairs. In fact, moving your legs is very important to decrease your risk of a blood clot.  It will also help keep you from getting deconditioned.  Every 1 to 2 hours get up and walk for 5 minutes. This will help with a quicker recovery back to normal.  Let pain be your guide so you don't do too much.  This is not the time for spring cleaning and don't plan on taking care of anyone else.  This time is for you to recover,  You will be more comfortable if you sleep and rest with your head elevated either with a few pillows under you or in a recliner.  No stomach sleeping for a three months.  WORK Everyone   returns to work at different times. As a rough guide, most people take at least 1 - 2 weeks off prior to returning to work. If you need documentation for your job, bring the forms to your postoperative follow up visit.  DRIVING Arrange for someone to bring you home from the hospital.  You may be able to drive a few days after surgery but not while taking any narcotics or valium.  BOWEL MOVEMENTS Constipation can occur after anesthesia and while taking pain medication.  It is important to stay ahead for your comfort.  We recommend taking Milk of Magnesia (2 tablespoons; twice a day) while taking the pain pills.  MEDICATIONS You may be prescribed should start after surgery At your  preoperative visit for you history and physical you may have been given the following medications: An antibiotic: Start this medication when you get home and take according to the instructions on the bottle. Zofran 4 mg:  This is to treat nausea and vomiting.  You can take this every 6 hours as needed and only if needed. Valium 2 mg: This is for muscle tightness if you have an implant or expander. This will help relax your muscle which also helps with pain control.  This can be taken every 12 hours as needed. Don't drive after taking this medication. Norco (hydrocodone/acetaminophen) 5/325 mg:  This is only to be used after you have taken the motrin or the tylenol. Every 8 hours as needed.   Over the counter Medication to take: Ibuprofen (Motrin) 600 mg:  Take this every 6 hours.  If you have additional pain then take 500 mg of the tylenol every 8 hours.  Only take the Norco after you have tried these two. Miralax or stool softener of choice: Take this according to the bottle if you take the Norco.  WHEN TO CALL Call your surgeon's office if any of the following occur: Fever 101 degrees F or greater Excessive bleeding or fluid from the incision site. Pain that increases over time without aid from the medications Redness, warmth, or pus draining from incision sites Persistent nausea or inability to take in liquids Severe misshapen area that underwent the operation.   Post Anesthesia Home Care Instructions  Activity: Get plenty of rest for the remainder of the day. A responsible individual must stay with you for 24 hours following the procedure.  For the next 24 hours, DO NOT: -Drive a car -Operate machinery -Drink alcoholic beverages -Take any medication unless instructed by your physician -Make any legal decisions or sign important papers.  Meals: Start with liquid foods such as gelatin or soup. Progress to regular foods as tolerated. Avoid greasy, spicy, heavy foods. If nausea  and/or vomiting occur, drink only clear liquids until the nausea and/or vomiting subsides. Call your physician if vomiting continues.  Special Instructions/Symptoms: Your throat may feel dry or sore from the anesthesia or the breathing tube placed in your throat during surgery. If this causes discomfort, gargle with warm salt water. The discomfort should disappear within 24 hours.  If you had a scopolamine patch placed behind your ear for the management of post- operative nausea and/or vomiting:  1. The medication in the patch is effective for 72 hours, after which it should be removed.  Wrap patch in a tissue and discard in the trash. Wash hands thoroughly with soap and water. 2. You may remove the patch earlier than 72 hours if you experience unpleasant side effects which may include dry mouth, dizziness   or visual disturbances. 3. Avoid touching the patch. Wash your hands with soap and water after contact with the patch.     

## 2021-05-16 NOTE — Anesthesia Preprocedure Evaluation (Addendum)
Anesthesia Evaluation  ?Patient identified by MRN, date of birth, ID band ?Patient awake ? ? ? ?Reviewed: ?Allergy & Precautions, NPO status , Patient's Chart, lab work & pertinent test results ? ?Airway ?Mallampati: I ? ?TM Distance: >3 FB ?Neck ROM: Full ? ? ? Dental ?no notable dental hx. ?(+) Teeth Intact, Dental Advisory Given ?  ?Pulmonary ?neg pulmonary ROS,  ?  ?Pulmonary exam normal ?breath sounds clear to auscultation ? ? ? ? ? ? Cardiovascular ?hypertension, Pt. on medications ?Normal cardiovascular exam ?Rhythm:Regular Rate:Normal ? ? ?  ?Neuro/Psych ?negative neurological ROS ? negative psych ROS  ? GI/Hepatic ?Neg liver ROS, GERD  ,  ?Endo/Other  ?Hypothyroidism  ? Renal/GU ?negative Renal ROS  ?negative genitourinary ?  ?Musculoskeletal ? ?(+) Arthritis ,  ? Abdominal ?  ?Peds ? Hematology ?negative hematology ROS ?(+)   ?Anesthesia Other Findings ?Right breast CA ? Reproductive/Obstetrics ? ?  ? ? ? ? ? ? ? ? ? ? ? ? ? ?  ?  ? ? ? ? ? ? ? ?Anesthesia Physical ?Anesthesia Plan ? ?ASA: 2 ? ?Anesthesia Plan: General  ? ?Post-op Pain Management:   ? ?Induction: Intravenous ? ?PONV Risk Score and Plan: 3 and Ondansetron, Dexamethasone and Midazolam ? ?Airway Management Planned: LMA ? ?Additional Equipment:  ? ?Intra-op Plan:  ? ?Post-operative Plan: Extubation in OR ? ?Informed Consent: I have reviewed the patients History and Physical, chart, labs and discussed the procedure including the risks, benefits and alternatives for the proposed anesthesia with the patient or authorized representative who has indicated his/her understanding and acceptance.  ? ? ? ?Dental advisory given ? ?Plan Discussed with: CRNA ? ?Anesthesia Plan Comments:   ? ? ? ? ? ? ?Anesthesia Quick Evaluation ? ?

## 2021-05-16 NOTE — Transfer of Care (Signed)
Immediate Anesthesia Transfer of Care Note ? ?Patient: Olivia [name] ? ?Procedure(s) Performed: LIPOSUCTION WITH LIPOFILLING (Bilateral: Breast) ? ?Patient Location: PACU ? ?Anesthesia Type:General ? ?Level of Consciousness: sedated ? ?Airway & Oxygen Therapy: Patient Spontanous Breathing and Patient connected to face mask oxygen ? ?Post-op Assessment: Report given to RN and Post -op Vital signs reviewed and stable ? ?Post vital signs: Reviewed and stable ? ?Last Vitals:  ?Vitals Value Taken Time  ?BP    ?Temp    ?Pulse    ?Resp    ?SpO2    ? ? ?Last Pain:  ?Vitals:  ? 05/16/21 1116  ?TempSrc: Oral  ?PainSc: 0-No pain  ?   ? ?Patients Stated Pain Goal: 8 (05/16/21 1116) ? ?Complications: No notable events documented. ?

## 2021-05-17 NOTE — Anesthesia Postprocedure Evaluation (Signed)
Anesthesia Post Note ? ?Patient: PORCHEA CHARRIER ? ?Procedure(s) Performed: LIPOSUCTION WITH LIPOFILLING (Bilateral: Breast) ? ?  ? ?Patient location during evaluation: PACU ?Anesthesia Type: General ?Level of consciousness: awake and alert ?Pain management: pain level controlled ?Vital Signs Assessment: post-procedure vital signs reviewed and stable ?Respiratory status: spontaneous breathing, nonlabored ventilation, respiratory function stable and patient connected to nasal cannula oxygen ?Cardiovascular status: blood pressure returned to baseline and stable ?Postop Assessment: no apparent nausea or vomiting ?Anesthetic complications: no ? ? ?No notable events documented. ? ?Last Vitals:  ?Vitals:  ? 05/16/21 1500 05/16/21 1523  ?BP: 106/65 119/73  ?Pulse: 70 79  ?Resp: 16 16  ?Temp:  36.7 ?C  ?SpO2: 98% 97%  ?  ?Last Pain:  ?Vitals:  ? 05/16/21 1523  ?TempSrc:   ?PainSc: 0-No pain  ? ? ?  ?  ?  ?  ?  ?  ? ?Tamel Abel L Yarianna Varble ? ? ? ? ?

## 2021-05-20 ENCOUNTER — Encounter (HOSPITAL_BASED_OUTPATIENT_CLINIC_OR_DEPARTMENT_OTHER): Payer: Self-pay | Admitting: Plastic Surgery

## 2021-05-21 ENCOUNTER — Other Ambulatory Visit: Payer: BC Managed Care – PPO | Admitting: Surgical

## 2021-05-23 ENCOUNTER — Ambulatory Visit (INDEPENDENT_AMBULATORY_CARE_PROVIDER_SITE_OTHER): Payer: BC Managed Care – PPO | Admitting: Family Medicine

## 2021-05-23 ENCOUNTER — Encounter: Payer: Self-pay | Admitting: Family Medicine

## 2021-05-23 VITALS — BP 115/74 | HR 66 | Temp 98.2°F | Ht 64.0 in | Wt 139.0 lb

## 2021-05-23 DIAGNOSIS — Z1211 Encounter for screening for malignant neoplasm of colon: Secondary | ICD-10-CM | POA: Diagnosis not present

## 2021-05-23 DIAGNOSIS — M818 Other osteoporosis without current pathological fracture: Secondary | ICD-10-CM

## 2021-05-23 DIAGNOSIS — E034 Atrophy of thyroid (acquired): Secondary | ICD-10-CM

## 2021-05-23 DIAGNOSIS — E782 Mixed hyperlipidemia: Secondary | ICD-10-CM | POA: Diagnosis not present

## 2021-05-23 DIAGNOSIS — Z170 Estrogen receptor positive status [ER+]: Secondary | ICD-10-CM | POA: Diagnosis not present

## 2021-05-23 DIAGNOSIS — Z0000 Encounter for general adult medical examination without abnormal findings: Secondary | ICD-10-CM | POA: Diagnosis not present

## 2021-05-23 DIAGNOSIS — Z131 Encounter for screening for diabetes mellitus: Secondary | ICD-10-CM | POA: Diagnosis not present

## 2021-05-23 DIAGNOSIS — C50211 Malignant neoplasm of upper-inner quadrant of right female breast: Secondary | ICD-10-CM | POA: Diagnosis not present

## 2021-05-23 DIAGNOSIS — Z23 Encounter for immunization: Secondary | ICD-10-CM | POA: Diagnosis not present

## 2021-05-23 DIAGNOSIS — M1990 Unspecified osteoarthritis, unspecified site: Secondary | ICD-10-CM

## 2021-05-23 DIAGNOSIS — Z79899 Other long term (current) drug therapy: Secondary | ICD-10-CM | POA: Insufficient documentation

## 2021-05-23 LAB — CBC
HCT: 40.3 % (ref 36.0–46.0)
Hemoglobin: 13.5 g/dL (ref 12.0–15.0)
MCHC: 33.6 g/dL (ref 30.0–36.0)
MCV: 88.8 fl (ref 78.0–100.0)
Platelets: 269 10*3/uL (ref 150.0–400.0)
RBC: 4.54 Mil/uL (ref 3.87–5.11)
RDW: 14.9 % (ref 11.5–15.5)
WBC: 5.5 10*3/uL (ref 4.0–10.5)

## 2021-05-23 LAB — LIPID PANEL
Cholesterol: 135 mg/dL (ref 0–200)
HDL: 53.3 mg/dL (ref >39.00)
LDL Cholesterol: 64 mg/dL (ref 0–99)
NonHDL: 82.08
Total CHOL/HDL Ratio: 3
Triglycerides: 89 mg/dL (ref 0.0–149.0)
VLDL: 17.8 mg/dL (ref 0.0–40.0)

## 2021-05-23 LAB — VITAMIN D 25 HYDROXY (VIT D DEFICIENCY, FRACTURES): VITD: 35.32 ng/mL (ref 30.00–100.00)

## 2021-05-23 LAB — HEMOGLOBIN A1C: Hgb A1c MFr Bld: 6.1 % (ref 4.6–6.5)

## 2021-05-23 LAB — TSH: TSH: 2.02 u[IU]/mL (ref 0.35–5.50)

## 2021-05-23 LAB — T4, FREE: Free T4: 0.93 ng/dL (ref 0.60–1.60)

## 2021-05-23 LAB — T3, FREE: T3, Free: 2.8 pg/mL (ref 2.3–4.2)

## 2021-05-23 MED ORDER — DICLOFENAC SODIUM 75 MG PO TBEC: 75.0000 mg | DELAYED_RELEASE_TABLET | Freq: Every day | ORAL | 3 refills | Status: DC

## 2021-05-23 MED ORDER — ATORVASTATIN CALCIUM 20 MG PO TABS: 20.0000 mg | ORAL_TABLET | Freq: Every day | ORAL | 3 refills | Status: DC

## 2021-05-23 MED ORDER — DICLOFENAC SODIUM 75 MG PO TBEC: 75.0000 mg | DELAYED_RELEASE_TABLET | Freq: Every day | ORAL | 1 refills | Status: DC

## 2021-05-23 NOTE — Addendum Note (Signed)
Addended by: Kavin Leech on: 05/23/2021 10:37 AM ? ? Modules accepted: Orders ? ?

## 2021-05-23 NOTE — Patient Instructions (Addendum)
Return in about 1 year (around 05/26/2022) for cpe (20 min). ? ?Great to see you today.  ?I have refilled the medication(s) we provide.  ? ?If labs were collected, we will inform you of lab results once received either by echart message or telephone call.  ? - echart message- for normal results that have been seen by the patient already.  ? - telephone call: abnormal results or if patient has not viewed results in their echart. ? ? ?Health Maintenance, Female ?Adopting a healthy lifestyle and getting preventive care are important in promoting health and wellness. Ask your health care provider about: ?The right schedule for you to have regular tests and exams. ?Things you can do on your own to prevent diseases and keep yourself healthy. ?What should I know about diet, weight, and exercise? ?Eat a healthy diet ? ?Eat a diet that includes plenty of vegetables, fruits, low-fat dairy products, and lean protein. ?Do not eat a lot of foods that are high in solid fats, added sugars, or sodium. ?Maintain a healthy weight ?Body mass index (BMI) is used to identify weight problems. It estimates body fat based on height and weight. Your health care provider can help determine your BMI and help you achieve or maintain a healthy weight. ?Get regular exercise ?Get regular exercise. This is one of the most important things you can do for your health. Most adults should: ?Exercise for at least 150 minutes each week. The exercise should increase your heart rate and make you sweat (moderate-intensity exercise). ?Do strengthening exercises at least twice a week. This is in addition to the moderate-intensity exercise. ?Spend less time sitting. Even light physical activity can be beneficial. ?Watch cholesterol and blood lipids ?Have your blood tested for lipids and cholesterol at 60 years of age, then have this test every 5 years. ?Have your cholesterol levels checked more often if: ?Your lipid or cholesterol levels are high. ?You are  older than 60 years of age. ?You are at high risk for heart disease. ?What should I know about cancer screening? ?Depending on your health history and family history, you may need to have cancer screening at various ages. This may include screening for: ?Breast cancer. ?Cervical cancer. ?Colorectal cancer. ?Skin cancer. ?Lung cancer. ?What should I know about heart disease, diabetes, and high blood pressure? ?Blood pressure and heart disease ?High blood pressure causes heart disease and increases the risk of stroke. This is more likely to develop in people who have high blood pressure readings or are overweight. ?Have your blood pressure checked: ?Every 3-5 years if you are 70-56 years of age. ?Every year if you are 69 years old or older. ?Diabetes ?Have regular diabetes screenings. This checks your fasting blood sugar level. Have the screening done: ?Once every three years after age 51 if you are at a normal weight and have a low risk for diabetes. ?More often and at a younger age if you are overweight or have a high risk for diabetes. ?What should I know about preventing infection? ?Hepatitis B ?If you have a higher risk for hepatitis B, you should be screened for this virus. Talk with your health care provider to find out if you are at risk for hepatitis B infection. ?Hepatitis C ?Testing is recommended for: ?Everyone born from [birth year] through 1964. ?Anyone with known risk factors for hepatitis C. ?Sexually transmitted infections (STIs) ?Get screened for STIs, including gonorrhea and chlamydia, if: ?You are sexually active and are younger than 60 years of age. ?  You are older than 60 years of age and your health care provider tells you that you are at risk for this type of infection. ?Your sexual activity has changed since you were last screened, and you are at increased risk for chlamydia or gonorrhea. Ask your health care provider if you are at risk. ?Ask your health care provider about whether you are at high risk  for HIV. Your health care provider may recommend a prescription medicine to help prevent HIV infection. If you choose to take medicine to prevent HIV, you should first get tested for HIV. You should then be tested every 3 months for as long as you are taking the medicine. ?Pregnancy ?If you are about to stop having your period (premenopausal) and you may become pregnant, seek counseling before you get pregnant. ?Take 400 to 800 micrograms (mcg) of folic acid every day if you become pregnant. ?Ask for birth control (contraception) if you want to prevent pregnancy. ?Osteoporosis and menopause ?Osteoporosis is a disease in which the bones lose minerals and strength with aging. This can result in bone fractures. If you are 88 years old or older, or if you are at risk for osteoporosis and fractures, ask your health care provider if you should: ?Be screened for bone loss. ?Take a calcium or vitamin D supplement to lower your risk of fractures. ?Be given hormone replacement therapy (HRT) to treat symptoms of menopause. ?Follow these instructions at home: ?Alcohol use ?Do not drink alcohol if: ?Your health care provider tells you not to drink. ?You are pregnant, may be pregnant, or are planning to become pregnant. ?If you drink alcohol: ?Limit how much you have to: ?0-1 drink a day. ?Know how much alcohol is in your drink. In the [place] one drink equals one 12 oz bottle of beer (355 mL), one 5 oz glass of wine (148 mL), or one 1? oz glass of hard liquor (44 mL). ?Lifestyle ?Do not use any products that contain nicotine or tobacco. These products include cigarettes, chewing tobacco, and vaping devices, such as e-cigarettes. If you need help quitting, ask your health care provider. ?Do not use street drugs. ?Do not share needles. ?Ask your health care provider for help if you need support or information about quitting drugs. ?General instructions ?Schedule regular health, dental, and eye exams. ?Stay current with your  vaccines. ?Tell your health care provider if: ?You often feel depressed. ?You have ever been abused or do not feel safe at home. ?Summary ?Adopting a healthy lifestyle and getting preventive care are important in promoting health and wellness. ?Follow your health care provider's instructions about healthy diet, exercising, and getting tested or screened for diseases. ?Follow your health care provider's instructions on monitoring your cholesterol and blood pressure. ?This information is not intended to replace advice given to you by your health care provider. Make sure you discuss any questions you have with your health care provider. ?Document Revised: 06/11/2020 Document Reviewed: 06/11/2020 ?Elsevier Patient Education ? [place]. ? ?

## 2021-05-23 NOTE — Progress Notes (Signed)
? ?This visit occurred during the SARS-CoV-2 public health emergency.  Safety protocols were in place, including screening questions prior to the visit, additional usage of staff PPE, and extensive cleaning of exam room while observing appropriate contact time as indicated for disinfecting solutions.  ? ? ?Patient ID: Olivia [name], female  DOB: March 29, 1960, 61 y.o.   MRN: [id number] ?Patient Care Team  ?  Relationship Specialty Notifications Start End  ?Ma Hillock, DO PCP - General Family Medicine  12/26/14   ?Truitt Merle, MD Consulting Physician Hematology  10/15/16   ?Jovita Kussmaul, MD Consulting Physician General Surgery  10/15/16   ?Gery Pray, MD Consulting Physician Radiation Oncology  10/15/16   ?Sable Feil, MD Consulting Physician Gastroenterology  04/14/17   ?Molli Posey, MD Consulting Physician Obstetrics and Gynecology  04/14/17   ?Gardenia Phlegm, NP Nurse Practitioner Hematology and Oncology  07/08/17   ?Elayne Snare, MD Consulting Physician Endocrinology  04/22/18   ? Comment: thyroid nodules  ? ? ?Chief Complaint  ?Patient presents with  ? Annual Exam  ?  And cmc; pt is fasting  ? ? ?Subjective: ? ?Olivia [name] is a 61 y.o.  Female  present for CPE/CMC ?All past medical history, surgical history, allergies, family history, immunizations, medications and social history were updated in the electronic medical record today. ?All recent labs, ED visits and hospitalizations within the last year were reviewed. ? ?Health maintenance:  ?Colonoscopy: completed by Dr. Sharlett Iles January 2014. Ten-year follow-up.> referral placed ?Mammogram:  Double mastectomy 09/30/2016, patient being treated for recurrent breast cancer. N/i ?Cervical cancer screening: last pap: History of hysterectomy, pelvic exams completed by Dr. Grayling Congress. ?Immunizations: tdap UTD today, Influenza UTD 2022(encouraged yearly). Shingrix series completed. covid completed ?Infectious disease screening: HIV and  Hep C  completed ?DEXA: History of osteoporosis, on prolia injections every 6 months. Last DEXA scan 2021([facility]- ordered by speciliast team). Follow-up every 2 years ?Assistive device: none ?Oxygen XYO:FVWA ?Patient has a Dental home. ?Hospitalizations/ED visits: reviewed ?  ?Arthritis:  ?She reports diclofenac1-2 x daily and it  is working well for her. Mobic had not been working well for  ? ? ? ? ? ?  05/23/2021  ?  9:02 AM 02/28/2021  ? 11:28 AM 05/14/2020  ?  8:02 AM 05/10/2019  ?  8:39 AM 04/22/2018  ?  9:14 AM  ?Depression screen PHQ 2/9  ?Decreased Interest 0 0 0 0 0  ?Down, Depressed, Hopeless 0 0 0 0 0  ?PHQ - 2 Score 0 0 0 0 0  ?Altered sleeping     0  ?Tired, decreased energy     0  ?Change in appetite     0  ?Feeling bad or failure about yourself      0  ?Trouble concentrating     0  ?Moving slowly or fidgety/restless     0  ?Suicidal thoughts     0  ?PHQ-9 Score     0  ?Difficult doing work/chores     Not difficult at all  ? ? ?  04/22/2018  ?  9:14 AM  ?GAD 7 : Generalized Anxiety Score  ?Nervous, Anxious, on Edge 1  ?Control/stop worrying 1  ?Worry too much - different things 1  ?Trouble relaxing 0  ?Restless 0  ?Easily annoyed or irritable 1  ?Afraid - awful might happen 0  ?Total GAD 7 Score 4  ?Anxiety Difficulty Not difficult at all  ? ? ? ?  ?  ? ? ?  Immunization History  ?Administered Date(s) Administered  ? Influenza Split 11/26/2011  ? Influenza,inj,Quad PF,6+ Mos 11/20/2013, 10/20/2018, 11/04/2019, 11/26/2020  ? Influenza,inj,quad, With Preservative 11/18/2018  ? Influenza-Unspecified 12/05/2014, 10/03/2015, 11/10/2016, 10/04/2017  ? PFIZER Comirnaty(Gray Top)Covid-19 Tri-Sucrose Vaccine 07/20/2020  ? PFIZER(Purple Top)SARS-COV-2 Vaccination 05/12/2019, 06/06/2019, 12/07/2019  ? Tdap 01/29/2012  ? Unspecified SARS-COV-2 Vaccination 05/12/2019, 06/06/2019, 12/07/2019, 07/20/2020  ? Zoster Recombinat (Shingrix) 06/03/2017, 08/18/2017  ? ? ? ?Past Medical History:  ?Diagnosis Date  ? Adjustment insomnia  02/23/2019  ? Breast cancer ([place]) 2018  ? Lumpectomy, chemotherapy and radiation with recurrence 2018. Now on Femara  ? Cervical radiculopathy 07/05/2019  ? COVID-19 02/2019  ? Her husband also passed away at this time from COVID-57.  ? Family history of breast cancer   ? Genetic testing 10/30/2016  ? Breast/GYN panel (23 genes) @ [facility] - No pathogenic mutations detected  ? GERD (gastroesophageal reflux disease)   ? OTC medication  ? Grief reaction 02/23/2019  ? Heart murmur   ? as a child - Echo in early 20s "ok"  ? History of echocardiogram 05/2015  ? Echo 4/17: EF 55-60%, GLS -19.6%, normal wall motion  ? History of exercise stress test 05/2015  ? ETT 4/17 - No ST changes  ? HLD (hyperlipidemia)   ? Hypothyroidism   ? Multiple thyroid nodules 2009  ? Multiple nodules: Normal ultrasound reported at diagnosis  ? Osteoporosis   ? Personal history of chemotherapy   ? Personal history of radiation therapy   ? UNEQUAL LEG LENGTH, ACQUIRED 11/17/2006  ? Qualifier: Diagnosis of  By: Oneida Alar MD, KARL    ? ?No Known Allergies ?Past Surgical History:  ?Procedure Laterality Date  ? ABDOMINAL HYSTERECTOMY    ? BREAST LUMPECTOMY Right 2003  ? BREAST RECONSTRUCTION WITH PLACEMENT OF TISSUE EXPANDER AND FLEX HD (ACELLULAR HYDRATED DERMIS) Bilateral 11/17/2016  ? Procedure: IMEDITATE BREAST RECONSTRUCTION WITH PLACEMENT OF TISSUE EXPANDER AND FLEX HD (ACELLULAR HYDRATED DERMIS);  Surgeon: Wallace Going, DO;  Location: [place];  Service: Plastics;  Laterality: Bilateral;  ? BREAST SURGERY  2003 /right  ? lumpectomy; chemotherapy and radiation  ? LIPOSUCTION WITH LIPOFILLING Bilateral 02/23/2020  ? Procedure: Fat filling of breasts for symmetry from abdomen;  Surgeon: Wallace Going, DO;  Location: [place];  Service: Plastics;  Laterality: Bilateral;  90 min, please  ? LIPOSUCTION WITH LIPOFILLING Bilateral 05/16/2021  ? Procedure: LIPOSUCTION WITH LIPOFILLING;  Surgeon: Wallace Going, DO;   Location: [place];  Service: Plastics;  Laterality: Bilateral;  1.5 hours  ? REMOVAL OF BILATERAL TISSUE EXPANDERS WITH PLACEMENT OF BILATERAL BREAST IMPLANTS Bilateral 02/11/2017  ? Procedure: REMOVAL OF BILATERAL TISSUE EXPANDERS WITH PLACEMENT OF BILATERAL SILICONE BREAST IMPLANTS;  Surgeon: Wallace Going, DO;  Location: [place];  Service: Plastics;  Laterality: Bilateral;  ? TOTAL ABDOMINAL HYSTERECTOMY W/ BILATERAL SALPINGOOPHORECTOMY    ? for treatment of breast treatment  ? TOTAL MASTECTOMY Bilateral 11/17/2016  ? Procedure: RIGHT MASTECTOMY AND LEFT PROPHYLATIC MASTECTOMY;  Surgeon: Jovita Kussmaul, MD;  Location: [place];  Service: General;  Laterality: Bilateral;  ? ?Family History  ?Problem Relation Age of Onset  ? Diabetes Other   ?     1ST DEGREE RELATIVE  ? Coronary artery disease Mother   ?     1ST DEGREE RELATIVE  ? Heart attack Mother 31  ? Skin cancer Mother   ? Diabetes Father   ? Kidney disease Father   ?  Prostate cancer Father 36  ?     deceased 83  ? Cancer Brother 31  ?     multiple myeloma   ? Lung cancer Maternal Uncle   ?     in 2 mat uncles  ? Breast cancer Paternal Aunt 48  ? Breast cancer Cousin   ?     dx 25s; daughter of unaffected paternal aunt  ? Breast cancer Cousin   ?     dx 7s; another daughter of unaffected paternal aunt  ? Esophageal cancer Cousin   ?     pat first cousin  ? ?Social History  ? ?Social History Narrative  ? Married. Bobby.  ? Children: none. No pets.   ? Stay at home.   ? Wears her seatbelt. Smoke alarm in the home.   ? Feels safe in her relationships.   ? Exercise 5x week.   ? ? ?Allergies as of 05/23/2021   ?No Known Allergies ?  ? ?  ?Medication List  ?  ? ?  ? Accurate as of May 23, 2021  9:52 AM. If you have any questions, ask your nurse or doctor.  ?  ?  ? ?  ? ?STOP taking these medications   ? ?hydrochlorothiazide 12.5 MG tablet ?Commonly known as: HYDRODIURIL ?Stopped by: Howard Pouch, DO ?  ? ?  ? ?TAKE these  medications   ? ?atorvastatin 20 MG tablet ?Commonly known as: LIPITOR ?Take 1 tablet (20 mg total) by mouth daily. ?  ?Calcium 500 MG Chew ?Chew by mouth. ?  ?Collagenase Powd ?by Does not apply route. ?  ?diclofenac

## 2021-05-25 ENCOUNTER — Other Ambulatory Visit: Payer: Self-pay | Admitting: Nurse Practitioner

## 2021-05-25 ENCOUNTER — Encounter: Payer: Self-pay | Admitting: Nurse Practitioner

## 2021-05-25 DIAGNOSIS — Z170 Estrogen receptor positive status [ER+]: Secondary | ICD-10-CM

## 2021-05-25 NOTE — Progress Notes (Signed)
?[place]   ?[phone] [phone]   ?Clinic Follow up Note  ? ?Patient Care Team: ?Olivia Hillock, DO as PCP - General (Family Medicine) ?Olivia Merle, MD as Consulting Physician (Hematology) ?Olivia Kussmaul, MD as Consulting Physician (General Surgery) ?Olivia Pray, MD as Consulting Physician (Radiation Oncology) ?Olivia Feil, MD as Consulting Physician (Gastroenterology) ?Olivia Posey, MD as Consulting Physician (Obstetrics and Gynecology) ?Olivia Phlegm, NP as Nurse Practitioner (Hematology and Oncology) ?Olivia Snare, MD as Consulting Physician (Endocrinology) ?05/27/2021 ? ?CHIEF COMPLAINT: Follow up right breast cancer  ? ?SUMMARY OF ONCOLOGIC HISTORY: ?Oncology History Overview Note  ?Cancer Staging ?Malignant neoplasm of upper-inner quadrant of right breast in female, estrogen receptor positive ([place]) ?Staging form: Breast, AJCC 8th Edition ?- Clinical stage from 10/09/2016: Stage IA (cT1c, cN0, cM0, G3, ER: Positive, PR: Positive, HER2: Negative) - Signed by Olivia Merle, MD on 10/14/2016 ?- Pathologic stage from 11/17/2016: Stage IB (pT2, pN0, cM0, G3, ER: Positive, PR: Positive, HER2: Negative, Oncotype DX score: 21) - Signed by Olivia Merle, MD on 12/11/2016 ? ? ?  ?Malignant neoplasm of upper-inner quadrant of right breast in female, estrogen receptor positive ([place])  ?10/08/2016 Mammogram  ? Diagnostic mammogram and ultrasound showed an irregular mass in the ?right breast at 1 o'clock, 5 cm from the nipple measuring 1.6 x 1.4x 1.6 cm, axilla was negative for adenopathy. ? ?  ?10/09/2016 Receptors her2  ? Estrogen Receptor: 80%, POSITIVE, STRONG STAINING INTENSITY ?Progesterone Receptor: 30%, POSITIVE, STRONG STAINING INTENSITY ?Proliferation Marker Ki67: 35% ?HER2 NEGATIVE  ? ?  ?10/09/2016 Initial Diagnosis  ? Malignant neoplasm of upper-inner quadrant of right breast in female, estrogen receptor positive ([place]) ? ?  ?10/09/2016 Initial Biopsy  ?  Diagnosis ?Breast, right, needle core biopsy, upper inner quadrant, 1:00 o'clock ?- INVASIVE DUCTAL CARCINOMA, G3 ? ?  ?10/2016 Genetic Testing  ? Genetic testing was negative for The genes analyzed were the 23 genes on [facility]'s Breast/GYN panel (ATM, BARD1, BRCA1, BRCA2, BRIP1, CDH1, CHEK2, DICER1, EPCAM, MLH1,  MSH2, MSH6, NBN, NF1, PALB2, PMS2, PTEN, RAD50, RAD51C, RAD51D,SMARCA4, STK11, and TP53). ? ? ? ?  ?11/17/2016 Surgery  ? RIGHT MASTECTOMY AND LEFT PROPHYLATIC MASTECTOMY by Dr. Marlou [name] ? ?IMEDITATE BREAST RECONSTRUCTION WITH PLACEMENT OF TISSUE EXPANDER AND FLEX HD (ACELLULAR HYDRATED DERMIS) by Dr. Marla [name]  ? ? ?  ?11/17/2016 Pathology Results  ? Diagnosis 11/17/16 ?1. Breast, simple mastectomy, Left ?- BENIGN BREAST TISSUE WITH ECTATIC DUCTS. ?- ONE OF ONE LYMPH NODE NEGATIVE FOR CARCINOMA (0/1). ?2. Breast, simple mastectomy, Right ?- INVASIVE DUCTAL CARCINOMA, GRADE 3, SPANNING 2.4 CM. ?- HIGH GRADE DUCTAL CARCINOMA IN SITU. ?- INVASIVE CARCINOMA IS <0.1 CM OF THE POSTERIOR MARGIN (BROADLY) AND ANTERIOR MARGIN (FOCALLY). ?- IN SITU CARCINOMA IS >0.2 CM OF BOTH MARGINS. ?- PRIOR LUMPECTOMY SITE, NEGATIVE FOR CARCINOMA. ?- SEE ONCOLOGY TABLE. ?3. Breast, excision, Superior flap deep margin ?- BENIGN FIBROADIPOSE TISSUE. ? ?  ?11/17/2016 Miscellaneous  ? Oncotype on 11/17/16 ?Rcurence Risk Score of 21 ?10-Year risk of distant reucrrence with Tamoxifen alone is 13% ? ? ?  ?12/18/2016 -  Anti-estrogen oral therapy  ? Adjuvant Letrozole 2.5 mg daily; began 12/18/16. Due to arthritis I switched her to Exemestane 43m daily in 11/2019.  ?  ?12/24/2016 Imaging  ? BONE SCAN IMPRESSION: ?No evidence of osseous metastatic disease. ? ?  ?12/24/2016 Imaging  ? CT CAP IMPRESSION: ?1. Status post bilateral modified radical mastectomy and breast ?reconstruction, as above. No findings to suggest metastatic  disease ?in the chest, abdomen or pelvis. ?2. Multiple heterogeneous appearing thyroid nodules, as  discussed ?above. These are nonspecific but further evaluation with nonemergent ?thyroid ultrasound is recommended in the near future to determine ?any potential need for further evaluation with fine-needle ?aspiration. ?3. Incidental findings, as above. ? ?  ?02/11/2017 Surgery  ? REMOVAL OF BILATERAL TISSUE EXPANDERS WITH PLACEMENT OF BILATERAL SILICONE BREAST IMPLANTS by Dr. Marla [name]  ? ?  ? ? ?CURRENT THERAPY: Adjuvant Letrozole 2.5 mg daily; began 12/18/16. Due to arthritis I switched her to Exemestane 17m daily in 11/2019.  ?  ?INTERVAL HISTORY: [name] for follow up as scheduled. Last seen by Dr. FBurr Medico10/24/22. She had plastic surgery with lipofilling 05/16/21.  She is scheduled for postop follow-up with the PA tomorrow.  She recovered much better this time than her last Lipo filling procedure.  She continues exemestane, tolerating well with stable joint pain, mainly ankles and hands in the morning and hips.  Denies new bone pain.  This does not stop her, she continues to be very active.  She has lost 20 pounds intentionally, mostly by eliminating dairy when she had COVID and had to take Paxlovid.  She denies any bloating or GI complaints.  She was also able to come off her blood pressure medication with weight loss.  She has a higher co-pay with exemestane lately, $100 per month. ? ?All other systems were reviewed with the patient and are negative. ? ?MEDICAL HISTORY:  ?Past Medical History:  ?Diagnosis Date  ? Adjustment insomnia 02/23/2019  ? Breast cancer ([place] 2018  ? Lumpectomy, chemotherapy and radiation with recurrence 2018. Now on Femara  ? Cervical radiculopathy 07/05/2019  ? COVID-19 02/2019  ? Her husband also passed away at this time from CCOVID-28  ? Family history of breast cancer   ? Genetic testing 10/30/2016  ? Breast/GYN panel (23 genes) @ [facility] - No pathogenic mutations detected  ? GERD (gastroesophageal reflux disease)   ? OTC medication  ? Grief reaction 02/23/2019  ? Heart  murmur   ? as a child - Echo in early 20s "ok"  ? History of echocardiogram 05/2015  ? Echo 4/17: EF 55-60%, GLS -19.6%, normal wall motion  ? History of exercise stress test 05/2015  ? ETT 4/17 - No ST changes  ? HLD (hyperlipidemia)   ? Hypothyroidism   ? Multiple thyroid nodules 2009  ? Multiple nodules: Normal ultrasound reported at diagnosis  ? Osteoporosis   ? Personal history of chemotherapy   ? Personal history of radiation therapy   ? UNEQUAL LEG LENGTH, ACQUIRED 11/17/2006  ? Qualifier: Diagnosis of  By: FOneida AlarMD, KARL    ? ? ?SURGICAL HISTORY: ?Past Surgical History:  ?Procedure Laterality Date  ? ABDOMINAL HYSTERECTOMY    ? BREAST LUMPECTOMY Right 2003  ? BREAST RECONSTRUCTION WITH PLACEMENT OF TISSUE EXPANDER AND FLEX HD (ACELLULAR HYDRATED DERMIS) Bilateral 11/17/2016  ? Procedure: IMEDITATE BREAST RECONSTRUCTION WITH PLACEMENT OF TISSUE EXPANDER AND FLEX HD (ACELLULAR HYDRATED DERMIS);  Surgeon: DWallace Going DO;  [place] [facility]  Service: Plastics;  Laterality: Bilateral;  ? BREAST SURGERY  2003 /right  ? lumpectomy; chemotherapy and radiation  ? LIPOSUCTION WITH LIPOFILLING Bilateral 02/23/2020  ? Procedure: Fat filling of breasts for symmetry from abdomen;  Surgeon: DWallace Going DO;  [place] [facility]  Service: Plastics;  Laterality: Bilateral;  90 min, please  ? LIPOSUCTION WITH LIPOFILLING Bilateral 05/16/2021  ? Procedure: LIPOSUCTION WITH LIPOFILLING;  Surgeon: Wallace Going, DO;  Location: [place] [facility];  Service: Plastics;  Laterality: Bilateral;  1.5 hours  ? REMOVAL OF BILATERAL TISSUE EXPANDERS WITH PLACEMENT OF BILATERAL BREAST IMPLANTS Bilateral 02/11/2017  ? Procedure: REMOVAL OF BILATERAL TISSUE EXPANDERS WITH PLACEMENT OF BILATERAL SILICONE BREAST IMPLANTS;  Surgeon: Wallace Going, DO;  Location: [place];  Service: Plastics;  Laterality: Bilateral;  ? TOTAL ABDOMINAL HYSTERECTOMY W/ BILATERAL  SALPINGOOPHORECTOMY    ? for treatment of breast treatment  ? TOTAL MASTECTOMY Bilateral 11/17/2016  ? Procedure: RIGHT MASTECTOMY AND LEFT PROPHYLATIC MASTECTOMY;  Surgeon: Olivia Kussmaul, MD;  Location: [place];  Service: General;  Laterality: Bi

## 2021-05-27 ENCOUNTER — Inpatient Hospital Stay: Payer: BC Managed Care – PPO

## 2021-05-27 ENCOUNTER — Encounter: Payer: Self-pay | Admitting: Nurse Practitioner

## 2021-05-27 ENCOUNTER — Other Ambulatory Visit: Payer: Self-pay

## 2021-05-27 ENCOUNTER — Inpatient Hospital Stay: Payer: BC Managed Care – PPO | Attending: Nurse Practitioner | Admitting: Nurse Practitioner

## 2021-05-27 VITALS — BP 128/90 | HR 64 | Temp 98.0°F | Wt 140.9 lb

## 2021-05-27 DIAGNOSIS — C50211 Malignant neoplasm of upper-inner quadrant of right female breast: Secondary | ICD-10-CM | POA: Insufficient documentation

## 2021-05-27 DIAGNOSIS — M8580 Other specified disorders of bone density and structure, unspecified site: Secondary | ICD-10-CM | POA: Insufficient documentation

## 2021-05-27 DIAGNOSIS — Z170 Estrogen receptor positive status [ER+]: Secondary | ICD-10-CM | POA: Insufficient documentation

## 2021-05-27 DIAGNOSIS — Z9221 Personal history of antineoplastic chemotherapy: Secondary | ICD-10-CM | POA: Diagnosis not present

## 2021-05-27 DIAGNOSIS — E041 Nontoxic single thyroid nodule: Secondary | ICD-10-CM | POA: Insufficient documentation

## 2021-05-27 DIAGNOSIS — Z79899 Other long term (current) drug therapy: Secondary | ICD-10-CM | POA: Insufficient documentation

## 2021-05-27 DIAGNOSIS — Z923 Personal history of irradiation: Secondary | ICD-10-CM | POA: Diagnosis not present

## 2021-05-27 DIAGNOSIS — Z79811 Long term (current) use of aromatase inhibitors: Secondary | ICD-10-CM | POA: Insufficient documentation

## 2021-05-27 DIAGNOSIS — Z9013 Acquired absence of bilateral breasts and nipples: Secondary | ICD-10-CM | POA: Diagnosis not present

## 2021-05-27 NOTE — Progress Notes (Signed)
6/patient is a 60 year old female here for follow-up after fat grafting to bilateral breast with Dr. Marla Roe on 05/16/2021.  She is 11 days postop. ? ?She reports she is doing well after surgery, pain is minimal.  She reports minimal bruising after the liposuction and fat grafting.  She has noticed an improvement in the left breast fullness at the superior pole.  She reports she is interested in touchup of her nipple areolar tattoos, we will plan this for the fall. ? ?Chaperone present on exam ?On exam abdominal incision is intact, healing well.  Left breast incision is intact and healing well.  There is no erythema or cellulitic changes noted.  Improved volume in the superior pole is noted. ? ?Recommend continue with compression to abdomen to help with swelling and pain. ?Continue with compressive garments to bilateral breasts with compression garment ? ?Patient has follow-up scheduled with Dr. Marla Roe on 06/11/2021. ?

## 2021-05-28 ENCOUNTER — Ambulatory Visit (INDEPENDENT_AMBULATORY_CARE_PROVIDER_SITE_OTHER): Payer: BC Managed Care – PPO | Admitting: Surgical

## 2021-05-28 ENCOUNTER — Encounter: Payer: Self-pay | Admitting: Surgical

## 2021-05-28 DIAGNOSIS — C50211 Malignant neoplasm of upper-inner quadrant of right female breast: Secondary | ICD-10-CM

## 2021-05-28 DIAGNOSIS — Z9889 Other specified postprocedural states: Secondary | ICD-10-CM

## 2021-05-28 DIAGNOSIS — Z170 Estrogen receptor positive status [ER+]: Secondary | ICD-10-CM

## 2021-05-28 DIAGNOSIS — Z9013 Acquired absence of bilateral breasts and nipples: Secondary | ICD-10-CM

## 2021-06-05 ENCOUNTER — Ambulatory Visit (INDEPENDENT_AMBULATORY_CARE_PROVIDER_SITE_OTHER): Payer: Self-pay | Admitting: Surgical

## 2021-06-05 DIAGNOSIS — Z719 Counseling, unspecified: Secondary | ICD-10-CM

## 2021-06-05 NOTE — Progress Notes (Signed)
Preoperative Dx: Hyperpigmentation/sun damage ? ?Postoperative Dx:  same ? ?Procedure: laser to chest and neck ? ?Anesthesia: none ? ?Description of Procedure:  ?Risks and complications were explained to the patient. Consent was confirmed and signed. Time out was called and all information was confirmed to be correct. The area was prepped with alcohol and wiped dry. The 560 nm laser was set at 7 J/cm2. The 515 nm laser was set at 8 J/cm2. The neck and chest was lasered. The patient tolerated the procedure well and there were no complications.  ? ?

## 2021-06-11 ENCOUNTER — Ambulatory Visit (INDEPENDENT_AMBULATORY_CARE_PROVIDER_SITE_OTHER): Payer: BC Managed Care – PPO | Admitting: Student

## 2021-06-11 DIAGNOSIS — Z9013 Acquired absence of bilateral breasts and nipples: Secondary | ICD-10-CM

## 2021-06-11 DIAGNOSIS — Z170 Estrogen receptor positive status [ER+]: Secondary | ICD-10-CM

## 2021-06-11 DIAGNOSIS — C50211 Malignant neoplasm of upper-inner quadrant of right female breast: Secondary | ICD-10-CM

## 2021-06-11 DIAGNOSIS — Z9889 Other specified postprocedural states: Secondary | ICD-10-CM

## 2021-06-11 NOTE — Progress Notes (Signed)
Patient is a 61 year old female with history of invasive ductal carcinoma of the right breast and is status post bilateral mastectomies with bilateral breast reconstruction.  She recently underwent lipo filling of the left breast for breast asymmetry after reconstruction with Dr. Marla Roe on 05/16/2021. ? ?Patient was seen on 05/28/2021 in the clinic for postop follow-up.  At this visit she was doing well and was noted to have minimal bruising and improved volume in the superior pole of the left breast. ? ?Today patient reports she is doing very well. She denies any issues, fevers, chills, nausea or vomiting.  She reports that her bruising has greatly improved.  She states she does have some very minor tenderness in the left breast and abdomen at times.  Patient mentioned that she feels the medial aspect of her left breast could be slightly more plump.  Discussed with patient that this could be further assessed and discussed at her follow-up visit in 6 months if she feels it is still bothersome. Overall she states she is very happy with her results and is feeling good.   ? ?On exam, patient is sitting upright in no acute distress.  Incision sites in the abdomen and left breast are healed. No signs of infection.  Left breast and abdomen are soft.  No bruising noted. ? ?Discussed with patient to continue compressive sports bra and spanks for a few more days.   ? ?Patient to follow-up in the clinic in 6 months for reevaluation. ? ? ? ? ? ? ? ?

## 2021-06-22 ENCOUNTER — Other Ambulatory Visit: Payer: Self-pay | Admitting: Hematology

## 2021-07-03 ENCOUNTER — Other Ambulatory Visit: Payer: Self-pay

## 2021-07-03 ENCOUNTER — Encounter: Payer: Self-pay | Admitting: Nurse Practitioner

## 2021-07-03 MED ORDER — EXEMESTANE 25 MG PO TABS: 25.0000 mg | ORAL_TABLET | Freq: Every day | ORAL | 1 refills | Status: DC

## 2021-07-24 ENCOUNTER — Telehealth: Payer: Self-pay | Admitting: *Deleted

## 2021-07-24 NOTE — Telephone Encounter (Signed)
Received on (04/03/2020) via of fax DME Standard Written Order from [facility]. Requesting signature and return.  Given to provider to sign and return.   DME Standard Written Order signed and faxed back to [facility].  Confirmation received and copy scanned into the chart.//AB/CMA

## 2021-08-21 DIAGNOSIS — Z124 Encounter for screening for malignant neoplasm of cervix: Secondary | ICD-10-CM | POA: Diagnosis not present

## 2021-08-21 DIAGNOSIS — Z01419 Encounter for gynecological examination (general) (routine) without abnormal findings: Secondary | ICD-10-CM | POA: Diagnosis not present

## 2021-08-21 DIAGNOSIS — Z6824 Body mass index (BMI) 24.0-24.9, adult: Secondary | ICD-10-CM | POA: Diagnosis not present

## 2021-08-21 DIAGNOSIS — Z1151 Encounter for screening for human papillomavirus (HPV): Secondary | ICD-10-CM | POA: Diagnosis not present

## 2021-10-15 DIAGNOSIS — Z719 Counseling, unspecified: Secondary | ICD-10-CM

## 2021-10-16 DIAGNOSIS — H2513 Age-related nuclear cataract, bilateral: Secondary | ICD-10-CM | POA: Diagnosis not present

## 2021-10-21 DIAGNOSIS — N941 Unspecified dyspareunia: Secondary | ICD-10-CM | POA: Diagnosis not present

## 2021-11-12 ENCOUNTER — Other Ambulatory Visit: Payer: Self-pay | Admitting: Family Medicine

## 2021-11-13 ENCOUNTER — Encounter: Payer: Self-pay | Admitting: Family Medicine

## 2021-11-14 ENCOUNTER — Ambulatory Visit
Admission: RE | Admit: 2021-11-14 | Discharge: 2021-11-14 | Disposition: A | Discharge status: Home / Self Care | Payer: BC Managed Care – PPO | Source: Ambulatory Visit | Attending: Family Medicine | Admitting: Family Medicine

## 2021-11-14 DIAGNOSIS — Z780 Asymptomatic menopausal state: Secondary | ICD-10-CM | POA: Diagnosis not present

## 2021-11-14 DIAGNOSIS — M8589 Other specified disorders of bone density and structure, multiple sites: Secondary | ICD-10-CM | POA: Diagnosis not present

## 2021-11-14 DIAGNOSIS — M818 Other osteoporosis without current pathological fracture: Secondary | ICD-10-CM

## 2021-11-19 ENCOUNTER — Telehealth: Payer: Self-pay | Admitting: Family Medicine

## 2021-11-19 DIAGNOSIS — M810 Age-related osteoporosis without current pathological fracture: Secondary | ICD-10-CM

## 2021-11-19 NOTE — Telephone Encounter (Signed)
Spoke with patient regarding results/recommendations. States oncology told her to take a break since she had been on it for 5 years. Sees oncology next month and will discuss with them so they can let our office know if we need to start the approval process.

## 2021-11-19 NOTE — Telephone Encounter (Signed)
Please inform patient Her bone density score is -2.4 at the area of least density.  In the past it was -2.5 in the area of least density.  Although it sounds like it is improved, it actually is worsened since last screening secondary to the right femur neck went from -1.8, to -2.4 (worsened). The total femoral score went from -1.1 to -1.4 (worsened). The forearm improved from -2.5, to 2.1.   She had discontinued the Prolia injections if I remember correctly, I do not recall the reasoning.  I would recommend she continue Prolia if she is still eligible for her osteoporosis.

## 2021-11-24 ENCOUNTER — Emergency Department (HOSPITAL_BASED_OUTPATIENT_CLINIC_OR_DEPARTMENT_OTHER)
Admission: EM | Admit: 2021-11-24 | Discharge: 2021-11-24 | Disposition: A | Discharge status: Home / Self Care | Payer: BC Managed Care – PPO | Attending: Emergency Medicine | Admitting: Emergency Medicine

## 2021-11-24 ENCOUNTER — Emergency Department (HOSPITAL_BASED_OUTPATIENT_CLINIC_OR_DEPARTMENT_OTHER): Payer: BC Managed Care – PPO

## 2021-11-24 ENCOUNTER — Encounter (HOSPITAL_BASED_OUTPATIENT_CLINIC_OR_DEPARTMENT_OTHER): Payer: Self-pay | Admitting: Emergency Medicine

## 2021-11-24 DIAGNOSIS — W1839XA Other fall on same level, initial encounter: Secondary | ICD-10-CM | POA: Diagnosis not present

## 2021-11-24 DIAGNOSIS — S0512XA Contusion of eyeball and orbital tissues, left eye, initial encounter: Secondary | ICD-10-CM | POA: Diagnosis not present

## 2021-11-24 DIAGNOSIS — Z79899 Other long term (current) drug therapy: Secondary | ICD-10-CM | POA: Insufficient documentation

## 2021-11-24 DIAGNOSIS — E039 Hypothyroidism, unspecified: Secondary | ICD-10-CM | POA: Insufficient documentation

## 2021-11-24 DIAGNOSIS — R55 Syncope and collapse: Secondary | ICD-10-CM | POA: Diagnosis not present

## 2021-11-24 DIAGNOSIS — Z853 Personal history of malignant neoplasm of breast: Secondary | ICD-10-CM | POA: Diagnosis not present

## 2021-11-24 DIAGNOSIS — W19XXXA Unspecified fall, initial encounter: Secondary | ICD-10-CM

## 2021-11-24 DIAGNOSIS — S62114A Nondisplaced fracture of triquetrum [cuneiform] bone, right wrist, initial encounter for closed fracture: Secondary | ICD-10-CM

## 2021-11-24 DIAGNOSIS — M4802 Spinal stenosis, cervical region: Secondary | ICD-10-CM | POA: Diagnosis not present

## 2021-11-24 DIAGNOSIS — S12691A Other nondisplaced fracture of seventh cervical vertebra, initial encounter for closed fracture: Secondary | ICD-10-CM | POA: Diagnosis not present

## 2021-11-24 DIAGNOSIS — I10 Essential (primary) hypertension: Secondary | ICD-10-CM | POA: Insufficient documentation

## 2021-11-24 DIAGNOSIS — M546 Pain in thoracic spine: Secondary | ICD-10-CM | POA: Diagnosis not present

## 2021-11-24 DIAGNOSIS — M25531 Pain in right wrist: Secondary | ICD-10-CM | POA: Diagnosis not present

## 2021-11-24 DIAGNOSIS — S6991XA Unspecified injury of right wrist, hand and finger(s), initial encounter: Secondary | ICD-10-CM | POA: Diagnosis not present

## 2021-11-24 LAB — CBC WITH DIFFERENTIAL/PLATELET
Abs Immature Granulocytes: 0.02 10*3/uL (ref 0.00–0.07)
Basophils Absolute: 0 10*3/uL (ref 0.0–0.1)
Basophils Relative: 0 %
Eosinophils Absolute: 0 10*3/uL (ref 0.0–0.5)
Eosinophils Relative: 0 %
HCT: 44.8 % (ref 36.0–46.0)
Hemoglobin: 15 g/dL (ref 12.0–15.0)
Immature Granulocytes: 0 %
Lymphocytes Relative: 18 %
Lymphs Abs: 1.5 10*3/uL (ref 0.7–4.0)
MCH: 29.5 pg (ref 26.0–34.0)
MCHC: 33.5 g/dL (ref 30.0–36.0)
MCV: 88 fL (ref 80.0–100.0)
Monocytes Absolute: 0.5 10*3/uL (ref 0.1–1.0)
Monocytes Relative: 7 %
Neutro Abs: 6.2 10*3/uL (ref 1.7–7.7)
Neutrophils Relative %: 75 %
Platelets: 286 10*3/uL (ref 150–400)
RBC: 5.09 MIL/uL (ref 3.87–5.11)
RDW: 13.6 % (ref 11.5–15.5)
WBC: 8.3 10*3/uL (ref 4.0–10.5)
nRBC: 0 % (ref 0.0–0.2)

## 2021-11-24 LAB — COMPREHENSIVE METABOLIC PANEL
ALT: 28 U/L (ref 0–44)
AST: 35 U/L (ref 15–41)
Albumin: 5 g/dL (ref 3.5–5.0)
Alkaline Phosphatase: 113 U/L (ref 38–126)
Anion gap: 9 (ref 5–15)
BUN: 21 mg/dL — ABNORMAL HIGH (ref 6–20)
CO2: 26 mmol/L (ref 22–32)
Calcium: 9.5 mg/dL (ref 8.9–10.3)
Chloride: 102 mmol/L (ref 98–111)
Creatinine, Ser: 0.83 mg/dL (ref 0.44–1.00)
GFR, Estimated: >60 mL/min (ref >60)
Glucose, Bld: 106 mg/dL — ABNORMAL HIGH (ref 70–99)
Potassium: 3.6 mmol/L (ref 3.5–5.1)
Sodium: 137 mmol/L (ref 135–145)
Total Bilirubin: 0.9 mg/dL (ref 0.3–1.2)
Total Protein: 8.1 g/dL (ref 6.5–8.1)

## 2021-11-24 MED ORDER — HYDROCODONE-ACETAMINOPHEN 5-325 MG PO TABS: 1.0000 | ORAL_TABLET | ORAL | 0 refills | Status: DC | PRN

## 2021-11-24 MED ORDER — SODIUM CHLORIDE 0.9 % IV BOLUS
1000.0000 mL | Freq: Once | INTRAVENOUS | Status: AC
  Administered 2021-11-24: 1000 mL via INTRAVENOUS

## 2021-11-24 NOTE — ED Triage Notes (Signed)
Pt had syncopal episode around 0300 this morning when she got up to use bathroom; fell face-first onto carpet; periorbital edema and discoloration present; c/o pain to entire back (including cervical), face and RT wrist

## 2021-11-24 NOTE — ED Provider Notes (Signed)
Olivia [name]   CSN: [id number] Arrival date & time: 11/24/21  1504     History  Chief Complaint  Patient presents with   Lytle Michaels    Olivia [name] is a 61 y.o. female.  The history is provided by the patient and medical records. No language interpreter was used.  Fall This is a new problem. The current episode started 6 to 12 hours ago. The problem occurs constantly. The problem has been resolved. Associated symptoms include headaches. Pertinent negatives include no chest pain, no abdominal pain and no shortness of breath. Nothing aggravates the symptoms. Nothing relieves the symptoms. She has tried nothing for the symptoms. The treatment provided no relief.       Home Medications Prior to Admission medications   Medication Sig Start Date End Date Taking? Authorizing Provider  atorvastatin (LIPITOR) 20 MG tablet Take 1 tablet (20 mg total) by mouth daily. 05/23/21   Kuneff, Renee A, DO  Calcium 500 MG CHEW Chew by mouth.    [provider]  Collagenase POWD by Does not apply route.    [provider]  diclofenac (VOLTAREN) 75 MG EC tablet Take 1 tablet (75 mg total) by mouth daily. 05/23/21   Kuneff, Renee A, DO  ELDERBERRY PO Take by mouth.    [provider]  exemestane (AROMASIN) 25 MG tablet Take 1 tablet (25 mg total) by mouth daily after breakfast. 07/03/21   Alla Feeling, NP  ondansetron (ZOFRAN) 4 MG tablet Take 1 tablet (4 mg total) by mouth every 8 (eight) hours as needed for nausea or vomiting. 05/13/21   Scheeler, Carola Rhine, PA-C  psyllium (METAMUCIL) 58.6 % powder Take 1 packet by mouth daily.    [provider]  Vitamin D, Cholecalciferol, 25 MCG (1000 UT) CAPS Take 1 capsule by mouth daily.    [provider]      Allergies    Patient has no known allergies.    Review of Systems   Review of Systems  Constitutional:  Negative for chills, diaphoresis, fatigue and fever.   HENT:  Negative for congestion.   Eyes:  Negative for pain and visual disturbance.  Respiratory:  Negative for cough, chest tightness, shortness of breath and wheezing.   Cardiovascular:  Negative for chest pain and palpitations.  Gastrointestinal:  Negative for abdominal pain, constipation, diarrhea, nausea and vomiting.  Genitourinary:  Negative for dysuria.  Musculoskeletal:  Positive for back pain and neck pain. Negative for neck stiffness.  Skin:  Positive for color change and wound. Negative for rash.  Neurological:  Positive for syncope and headaches. Negative for dizziness, seizures, facial asymmetry, weakness, light-headedness and numbness.  Psychiatric/Behavioral:  Negative for agitation and confusion.   All other systems reviewed and are negative.   Physical Exam Updated Vital Signs BP (!) 154/93   Pulse 93   Temp 98.4 F (36.9 C) (Oral)   Resp 16   Ht 5' (1.524 m)   Wt 63.5 kg   SpO2 100%   BMI 27.34 kg/m  Physical Exam Vitals and nursing [name] reviewed.  Constitutional:      General: She is not in acute distress.    Appearance: She is well-developed. She is not ill-appearing, toxic-appearing or diaphoretic.  HENT:     Head: Abrasion and contusion present.      Comments: Patient has intact extraocular movements and pupils are symmetric, round, reactive.  No nose tenderness.  No nasal septal hematoma.  Ear  had no hemotympanum.  Paraspinal neck tenderness and mid back tenderness.  Redness and bruising to right wrist.    Nose: No congestion or rhinorrhea.     Mouth/Throat:     Mouth: Mucous membranes are moist.     Pharynx: No oropharyngeal exudate or posterior oropharyngeal erythema.  Eyes:     Extraocular Movements: Extraocular movements intact.     Conjunctiva/sclera: Conjunctivae normal.  Cardiovascular:     Rate and Rhythm: Normal rate and regular rhythm.     Heart sounds: No murmur heard. Pulmonary:     Effort: Pulmonary effort is normal. No  respiratory distress.     Breath sounds: Normal breath sounds. No wheezing, rhonchi or rales.  Chest:     Chest wall: No tenderness.  Abdominal:     General: Abdomen is flat.     Palpations: Abdomen is soft.     Tenderness: There is no abdominal tenderness. There is no right CVA tenderness, left CVA tenderness, guarding or rebound.  Musculoskeletal:        General: Tenderness present. No swelling.     Right wrist: Tenderness present. No swelling, deformity, snuff box tenderness or crepitus. Normal pulse.     Cervical back: Neck supple. Tenderness present.     Right lower leg: No edema.     Left lower leg: No edema.  Skin:    General: Skin is warm and dry.     Capillary Refill: Capillary refill takes less than 2 seconds.     Findings: Bruising present.  Neurological:     General: No focal deficit present.     Mental Status: She is alert.     Sensory: No sensory deficit.     Motor: No weakness.  Psychiatric:        Mood and Affect: Mood normal.     ED Results / Procedures / Treatments   Labs (all labs ordered are listed, but only abnormal results are displayed) Labs Reviewed  COMPREHENSIVE METABOLIC PANEL - Abnormal; Notable for the following components:      Result Value   Glucose, Bld 106 (*)    BUN 21 (*)    All other components within normal limits  CBC WITH DIFFERENTIAL/PLATELET    EKG EKG Interpretation  Date/Time:  Sunday November 24 2021 16:32:38 EDT Ventricular Rate:  81 PR Interval:  151 QRS Duration: 90 QT Interval:  381 QTC Calculation: 443 R Axis:   20 Text Interpretation: Sinus rhythm similar appearance to prior. No STEMI Confirmed by Antony Blackbird [phone]) on 11/24/2021 6:21:24 PM  Radiology DG Thoracic Spine 2 View  Result Date: 11/24/2021 CLINICAL DATA:  Fall this morning.  Thoracic back pain. EXAM: THORACIC SPINE 2 VIEWS COMPARISON:  None Available. FINDINGS: There is no evidence of thoracic spine fracture. Alignment is normal. No focal bone  lesions identified. Mild thoracolumbar rotatory scoliosis noted. IMPRESSION: No acute findings. Mild thoracolumbar scoliosis. Electronically Signed   By: Marlaine Hind M.D.   On: 11/24/2021 16:38   DG Wrist Complete Right  Result Date: 11/24/2021 CLINICAL DATA:  Fall, wrist pain EXAM: RIGHT WRIST - COMPLETE 3+ VIEW COMPARISON:  None Available. FINDINGS: Possible nondisplaced fracture involving the dorsal cortex of the triquetrum seen only on lateral view. No additional fracture. No malalignment. Mild degenerative changes. Soft tissues within normal limits. IMPRESSION: Possible nondisplaced fracture involving the dorsal cortex of the triquetrum seen only on lateral view. Correlate with point tenderness. Electronically Signed   By: Davina Poke D.O.   On: 11/24/2021  16:31   DG Chest 2 View  Result Date: 11/24/2021 CLINICAL DATA:  Syncopal episode and fall this morning. EXAM: CHEST - 2 VIEW COMPARISON:  04/18/2015 FINDINGS: The heart size and mediastinal contours are within normal limits. Both lungs are clear. Surgical clips seen in both axillary regions. The visualized skeletal structures are unremarkable. IMPRESSION: No active cardiopulmonary disease. Electronically Signed   By: Marlaine Hind M.D.   On: 11/24/2021 16:30   CT Maxillofacial Wo Contrast  Result Date: 11/24/2021 CLINICAL DATA:  Syncopal episode with trauma to face EXAM: CT HEAD WITHOUT CONTRAST CT MAXILLOFACIAL WITHOUT CONTRAST TECHNIQUE: Multidetector CT imaging of the head and maxillofacial structures were performed using the standard protocol without intravenous contrast. Multiplanar CT image reconstructions of the maxillofacial structures were also generated. RADIATION DOSE REDUCTION: This exam was performed according to the departmental dose-optimization program which includes automated exposure control, adjustment of the mA and/or kV according to patient size and/or use of iterative reconstruction technique. COMPARISON:  None  Available. FINDINGS: CT HEAD FINDINGS Brain: No evidence of acute infarction, hemorrhage, hydrocephalus, extra-axial collection or mass lesion/mass effect. Vascular: No hyperdense vessel or unexpected calcification. Skull: No fracture or focal lesion. Other: None. CT MAXILLOFACIAL FINDINGS Osseous: No fracture or mandibular dislocation. No destructive process. Orbits: No traumatic or inflammatory finding. Sinuses: Clear. Soft tissues: Left frontal and periorbital soft tissue edema and stranding. IMPRESSION: CT HEAD: No intracranial hemorrhage, large territory infarction, or mass effect. CT MAXILLOFACIAL: 1. No acute facial bone fracture. 2. Left frontal and periorbital soft tissue contusion. Electronically Signed   By: Darrin Nipper M.D.   On: 11/24/2021 16:29   CT Head Wo Contrast  Result Date: 11/24/2021 CLINICAL DATA:  Syncopal episode with trauma to face EXAM: CT HEAD WITHOUT CONTRAST CT MAXILLOFACIAL WITHOUT CONTRAST TECHNIQUE: Multidetector CT imaging of the head and maxillofacial structures were performed using the standard protocol without intravenous contrast. Multiplanar CT image reconstructions of the maxillofacial structures were also generated. RADIATION DOSE REDUCTION: This exam was performed according to the departmental dose-optimization program which includes automated exposure control, adjustment of the mA and/or kV according to patient size and/or use of iterative reconstruction technique. COMPARISON:  None Available. FINDINGS: CT HEAD FINDINGS Brain: No evidence of acute infarction, hemorrhage, hydrocephalus, extra-axial collection or mass lesion/mass effect. Vascular: No hyperdense vessel or unexpected calcification. Skull: No fracture or focal lesion. Other: None. CT MAXILLOFACIAL FINDINGS Osseous: No fracture or mandibular dislocation. No destructive process. Orbits: No traumatic or inflammatory finding. Sinuses: Clear. Soft tissues: Left frontal and periorbital soft tissue edema and  stranding. IMPRESSION: CT HEAD: No intracranial hemorrhage, large territory infarction, or mass effect. CT MAXILLOFACIAL: 1. No acute facial bone fracture. 2. Left frontal and periorbital soft tissue contusion. Electronically Signed   By: Darrin Nipper M.D.   On: 11/24/2021 16:29   CT Cervical Spine Wo Contrast  Result Date: 11/24/2021 CLINICAL DATA:  SYNCOPAL EPISODE AT 3 A.M. PATIENT [place]. EXAM: CT CERVICAL SPINE WITHOUT CONTRAST TECHNIQUE: Multidetector CT imaging of the cervical spine was performed without intravenous contrast. Multiplanar CT image reconstructions were also generated. RADIATION DOSE REDUCTION: This exam was performed according to the departmental dose-optimization program which includes automated exposure control, adjustment of the mA and/or kV according to patient size and/or use of iterative reconstruction technique. COMPARISON:  None Available. FINDINGS: Alignment: No significant listhesis is present. Cervical lordosis is preserved. Skull base and vertebrae: Anterior inferior C7 vertebral body fracture noted. Posterior elements are intact. Additional fractures are  present. Soft tissues and spinal canal: Prevertebral soft tissue swelling is noted at the C7 level. Prevertebral soft tissues are otherwise within normal limits. Disc levels: Uncovertebral spurring results in bilateral foraminal stenosis at C5-6 and C6-7. Upper chest: Visualized lung apices are clear. IMPRESSION: 1. Anterior inferior C7 vertebral body fracture. 2. Prevertebral soft tissue swelling at the C7 level. 3. Uncovertebral spurring results in bilateral foraminal stenosis at C5-6 and C6-7. These results were called by telephone at the time of interpretation on 11/24/2021 at 4:22 pm to provider [facility] , who verbally acknowledged these results. Electronically Signed   By: San Morelle M.D.   On: 11/24/2021 16:25    Procedures Procedures    Medications Ordered in  ED Medications  sodium chloride 0.9 % bolus 1,000 mL (1,000 mLs Intravenous New Bag/Given 11/24/21 1638)    ED Course/ Medical Decision Making/ A&P                           Medical Decision Making Amount and/or Complexity of Data Reviewed Labs: ordered. Radiology: ordered.  Risk Prescription drug management.    LYNZY RAWLES is a 61 y.o. female with a past medical history significant for hypothyroidism, hypercholesteremia, hypertension, previous breast cancer, and hyperlipidemia who presents with pain after fall from syncopal episode.  According to patient, she returned from a trip to [place] recently and did not feel that she got dehydrated but was trying to drink lots of water.  She reports that she occasionally is lightheaded when she stands up quickly and reports at 3 AM last night after going to the bathroom, stood up after urination and subsequently had a syncopal episode.  She think she was out for a second or 2 and then got back into bed.  This morning she woke up with some bleeding and bruising on her left face.  She also has had headache, neck pain, mid back pain, and right wrist pain.  She is right-handed.  She says that she has had syncopal episodes in the past and has been worked up for them and work-up was reassuring.  She otherwise denies any leg pain or leg swelling, denies history of DVT or PE.  And denied any preceding symptoms such as palpitations chest pain or shortness of breath.  She think she is got lightheaded standing up quickly after urination.  On my exam, patient has bruising and swelling around her left orbit both above and below the eye.  She is denying any vision changes itself and is denying any pain in the eyeball.  She denies diplopia.  She denies any nausea, vomiting, speech difficulty, or neurologic complaints in her extremities.  She had tenderness in her right wrist but did not have snuffbox tenderness.  She had intact grip strength, sensation, and cap  refill.  There was some small bruising on the wrist.  Rest of the arm exam unremarkable.  Lungs were clear and chest was nontender.  No murmur.  Abdomen nontender.  Good pulses in extremities.  Patient did have dry mucous membranes on exam.  Likely I suspect that patient was mildly dehydrated from recent trip to [place] and then had an orthostatic post micturition syncopal episode leading her to fall onto a carpeted floor but hitting her head significantly.  With the bruising and tenderness, we will get CT imaging of the face, head, and neck.  I do feel there is some spasm in her mid paraspinal back so we  will get x-ray of the thoracic spine.  She denies any lower back tenderness or leg symptoms.  We will get chest x-ray as well and will get EKG, basic labs, and give her some fluids.  We had a shared decision-making conversation about eye exam with fluorescein and tetracaine but given her lack of eye pain, sensation of foreign body, or vision changes we read to hold on that at this time.  We will reassess after imaging and work-up.  5:19 PM Just spoke with Dr. Mable Fill with hand surgery who felt that she was appropriate for a removal wrist splint and can follow-up in clinic with him.  Awaiting neurosurgery consultation.  6:00 PM Spoke to Dr. Glenford Peers who reviewed the case.  He does feel that the patient needs a cervical immobilization collar that can be used for support to help it heal but she is able to take it off during showering.  He says she needs to wear it for the next 4 weeks and see him in clinic.  We will provide the collar and due to the pain we will discuss outpatient pain medication.  Anticipate discharge after reassessment.  Cervical immobilization collar was placed and she will get the wrist brace.  We will have her follow-up with neurosurgery and the orthopedic hand surgery team.  Patient agrees with plan of care and requested pain medicine.  Will give prescription for hydrocodone and  patient understands return precautions.  Patient discharged in good condition otherwise.         Final Clinical Impression(s) / ED Diagnoses Final diagnoses:  Other closed nondisplaced fracture of seventh cervical vertebra, initial encounter ([place])  Closed nondisplaced fracture of triquetrum of right wrist, initial encounter  Fall, initial encounter  Syncope, unspecified syncope type    Rx / DC Orders ED Discharge Orders          Ordered    HYDROcodone-acetaminophen (NORCO/VICODIN) 5-325 MG tablet  Every 4 hours PRN        11/24/21 1826            Clinical Impression: 1. Other closed nondisplaced fracture of seventh cervical vertebra, initial encounter ([place])   2. Closed nondisplaced fracture of triquetrum of right wrist, initial encounter   3. Fall, initial encounter   4. Syncope, unspecified syncope type     Disposition: Discharge  Condition: Good  I have discussed the results, Dx and Tx plan with the pt(& family if present). He/she/they expressed understanding and agree(s) with the plan. Discharge instructions discussed at great length. Strict return precautions discussed and pt &/or family have verbalized understanding of the instructions. No further questions at time of discharge.    New Prescriptions   HYDROCODONE-ACETAMINOPHEN (NORCO/VICODIN) 5-325 MG TABLET    Take 1 tablet by mouth every 4 (four) hours as needed.    Follow Up: Marvis Moeller, NP [address] [place] [address] [phone]   with neurosurgery to see in 4 weeks  Georgeanna [place], MD [place] [address] [phone]   with hand orthopedics  [place], [place] [address] [place] [address] [phone]     [place] [address] [id number] KZ [facility] [place]        Zoella Roberti, Gwenyth Allegra, MD 11/24/21 [phone]

## 2021-11-24 NOTE — ED Notes (Signed)
Return from ct.

## 2021-11-24 NOTE — Discharge Instructions (Addendum)
Your history, exam, work-up today revealed several injuries including the anterior-inferior C7 vertebral body fracture in the right triquetrum fracture in your wrist.  I spoke to neurosurgery with Dr. Cloretta Ned who wants to see you in 4 weeks and he request you use the cervical immobilization collar as much as possible and you may take it off when you shower.  I also spoke to Dr. Mable Fill with orthopedics he wants to see you in the next week for reassessment of the wrist injury.  He said you can use the removable wrist brace for that.  Otherwise, please rest and stay hydrated so you do not have any further syncopal episodes and be careful when standing up quickly after urination.  Please follow-up with your primary doctor but the other work-up was overall reassuring.  If any symptoms change or worsen acutely, please return to the nearest emergency department.

## 2021-11-24 NOTE — ED Notes (Signed)
Patient is in ct

## 2021-11-24 NOTE — ED Notes (Signed)
Reports headache,reports loc ,denies dizziness now ,denies blurred vision denies hitting her head rt wrist has bruise,left eye has a bruise forehead on left side bruise patient states it feel numb

## 2021-11-25 ENCOUNTER — Other Ambulatory Visit: Payer: BC Managed Care – PPO

## 2021-11-25 ENCOUNTER — Ambulatory Visit: Payer: BC Managed Care – PPO | Admitting: Hematology

## 2021-11-27 ENCOUNTER — Ambulatory Visit (INDEPENDENT_AMBULATORY_CARE_PROVIDER_SITE_OTHER): Payer: BC Managed Care – PPO | Admitting: Family Medicine

## 2021-11-27 ENCOUNTER — Encounter: Payer: Self-pay | Admitting: Family Medicine

## 2021-11-27 VITALS — BP 134/87 | HR 75 | Temp 98.2°F | Wt 143.4 lb

## 2021-11-27 DIAGNOSIS — M8008XD Age-related osteoporosis with current pathological fracture, vertebra(e), subsequent encounter for fracture with routine healing: Secondary | ICD-10-CM

## 2021-11-27 DIAGNOSIS — M8008XA Age-related osteoporosis with current pathological fracture, vertebra(e), initial encounter for fracture: Secondary | ICD-10-CM | POA: Insufficient documentation

## 2021-11-27 DIAGNOSIS — S62114D Nondisplaced fracture of triquetrum [cuneiform] bone, right wrist, subsequent encounter for fracture with routine healing: Secondary | ICD-10-CM | POA: Insufficient documentation

## 2021-11-27 DIAGNOSIS — Z23 Encounter for immunization: Secondary | ICD-10-CM | POA: Diagnosis not present

## 2021-11-27 DIAGNOSIS — R55 Syncope and collapse: Secondary | ICD-10-CM | POA: Diagnosis not present

## 2021-11-27 HISTORY — DX: Syncope and collapse: R55

## 2021-11-27 HISTORY — DX: Age-related osteoporosis with current pathological fracture, vertebra(e), initial encounter for fracture: M80.08XA

## 2021-11-27 HISTORY — DX: Nondisplaced fracture of triquetrum (cuneiform) bone, right wrist, subsequent encounter for fracture with routine healing: S62.114D

## 2021-11-27 MED ORDER — METHOCARBAMOL 500 MG PO TABS: 500.0000 mg | ORAL_TABLET | Freq: Three times a day (TID) | ORAL | 1 refills | Status: DC | PRN

## 2021-11-27 MED ORDER — NAPROXEN 500 MG PO TABS: 500.0000 mg | ORAL_TABLET | Freq: Two times a day (BID) | ORAL | 0 refills | Status: DC

## 2021-11-27 NOTE — Patient Instructions (Addendum)
Return if symptoms worsen or fail to improve.        Great to see you today.  I have refilled the medication(s) we provide.   If labs were collected, we will inform you of lab results once received either by echart message or telephone call.   - echart message- for normal results that have been seen by the patient already.   - telephone call: abnormal results or if patient has not viewed results in their echart.  

## 2021-11-27 NOTE — Progress Notes (Signed)
Olivia [name] , [birth year], 61 y.o., female MRN: [id number] Patient Care Team    Relationship Specialty Notifications Start End  Ma Hillock, DO PCP - General Family Medicine  12/26/14   Truitt Merle, MD Consulting Physician Hematology  10/15/16   Jovita Kussmaul, MD Consulting Physician General Surgery  10/15/16   Gery Pray, MD Consulting Physician Radiation Oncology  10/15/16   Sable Feil, MD Consulting Physician Gastroenterology  04/14/17   Molli Posey, MD Consulting Physician Obstetrics and Gynecology  04/14/17   Gardenia Phlegm, NP Nurse Practitioner Hematology and Oncology  07/08/17   Elayne Snare, MD Consulting Physician Endocrinology  04/22/18    Comment: thyroid nodules    Chief Complaint  Patient presents with   Hospitalization Follow-up    Fell on saturday     Subjective: Pt presents for an OV follow-up from ED visit after having a vasovagal/syncopal episode that resulted in a fall, in which she sustained a C7 anterior inferior vertebral fracture, right wrist triquetrum fracture and left facial abrasion and bruising.  CT was negative for orbital/facial fractures.  Patient presents today she is in for cervical collar and right wrist splint.  We reviewed all labs and imaging studies. She has follow-up scheduled with orthopedics for her wrist this Friday and neurology in November.  She is aware she is to wear her cervical collar for 4 weeks until follow-up. She reports today the pain has been tolerable.  She has taken a pain medication before bed.  She feels her muscles are very tense and stressed around her shoulders and neck.  She denies any numbness or tingling in upper extremities.     05/23/2021    9:02 AM 02/28/2021   11:28 AM 05/14/2020    8:02 AM 05/10/2019    8:39 AM 04/22/2018    9:14 AM  Depression screen PHQ 2/9  Decreased Interest 0 0 0 0 0  Down, Depressed, Hopeless 0 0 0 0 0  PHQ - 2 Score 0 0 0 0 0  Altered sleeping     0  Tired,  decreased energy     0  Change in appetite     0  Feeling bad or failure about yourself      0  Trouble concentrating     0  Moving slowly or fidgety/restless     0  Suicidal thoughts     0  PHQ-9 Score     0  Difficult doing work/chores     Not difficult at all    No Known Allergies Social History   Social History Narrative   Married. Bobby.   Children: none. No pets.    Stay at home.    Wears her seatbelt. Smoke alarm in the home.    Feels safe in her relationships.    Exercise 5x week.    Past Medical History:  Diagnosis Date   Adjustment insomnia 02/23/2019   Breast cancer ([place]) 2018   Lumpectomy, chemotherapy and radiation with recurrence 2018. Now on Femara   Cervical radiculopathy 07/05/2019   COVID-19 02/2019   Her husband also passed away at this time from COVID-19.   Family history of breast cancer    Genetic testing 10/30/2016   Breast/GYN panel (23 genes) @ [facility] - No pathogenic mutations detected   GERD (gastroesophageal reflux disease)    OTC medication   Grief reaction 02/23/2019   Heart murmur    as a child - Echo  in early 86s "ok"   History of echocardiogram 05/2015   Echo 4/17: EF 55-60%, GLS -19.6%, normal wall motion   History of exercise stress test 05/2015   ETT 4/17 - No ST changes   HLD (hyperlipidemia)    Hypothyroidism    Multiple thyroid nodules 2009   Multiple nodules: Normal ultrasound reported at diagnosis   Osteoporosis    Personal history of chemotherapy    Personal history of radiation therapy    UNEQUAL LEG LENGTH, ACQUIRED 11/17/2006   Qualifier: Diagnosis of  By: Oneida Alar MD, KARL     Past Surgical History:  Procedure Laterality Date   ABDOMINAL HYSTERECTOMY     BREAST LUMPECTOMY Right 2003   BREAST RECONSTRUCTION WITH PLACEMENT OF TISSUE EXPANDER AND FLEX HD (ACELLULAR HYDRATED DERMIS) Bilateral 11/17/2016   Procedure: IMEDITATE BREAST RECONSTRUCTION WITH PLACEMENT OF TISSUE EXPANDER AND FLEX HD (ACELLULAR HYDRATED DERMIS);   Surgeon: Wallace Going, DO;  Location: [facility];  Service: Plastics;  Laterality: Bilateral;   BREAST SURGERY  2003 /right   lumpectomy; chemotherapy and radiation   LIPOSUCTION WITH LIPOFILLING Bilateral 02/23/2020   Procedure: Fat filling of breasts for symmetry from abdomen;  Surgeon: Wallace Going, DO;  Location: [facility];  Service: Plastics;  Laterality: Bilateral;  90 min, please   LIPOSUCTION WITH LIPOFILLING Bilateral 05/16/2021   Procedure: LIPOSUCTION WITH LIPOFILLING;  Surgeon: Wallace Going, DO;  Location: [facility];  Service: Plastics;  Laterality: Bilateral;  1.5 hours   REMOVAL OF BILATERAL TISSUE EXPANDERS WITH PLACEMENT OF BILATERAL BREAST IMPLANTS Bilateral 02/11/2017   Procedure: REMOVAL OF BILATERAL TISSUE EXPANDERS WITH PLACEMENT OF BILATERAL SILICONE BREAST IMPLANTS;  Surgeon: Wallace Going, DO;  Location: [facility];  Service: Plastics;  Laterality: Bilateral;   TOTAL ABDOMINAL HYSTERECTOMY W/ BILATERAL SALPINGOOPHORECTOMY     for treatment of breast treatment   TOTAL MASTECTOMY Bilateral 11/17/2016   Procedure: RIGHT MASTECTOMY AND LEFT PROPHYLATIC MASTECTOMY;  Surgeon: Autumn Messing III, MD;  Location: [facility];  Service: General;  Laterality: Bilateral;   Family History  Problem Relation Age of Onset   Diabetes Other        1ST DEGREE RELATIVE   Coronary artery disease Mother        1ST DEGREE RELATIVE   Heart attack Mother 40   Skin cancer Mother    Diabetes Father    Kidney disease Father    Prostate cancer Father 9       deceased 76   Cancer Brother 56       multiple myeloma    Lung cancer Maternal Uncle        in 2 mat uncles   Breast cancer Paternal Aunt 57   Breast cancer Cousin        dx 10s; daughter of unaffected paternal aunt   Breast cancer Cousin        dx 15s; another daughter of unaffected paternal aunt   Esophageal cancer Cousin        pat first cousin   Allergies as of  11/27/2021   No Known Allergies      Medication List        Accurate as of November 27, 2021  1:18 PM. If you have any questions, ask your nurse or doctor.          STOP taking these medications    HYDROcodone-acetaminophen 5-325 MG tablet Commonly known as: NORCO/VICODIN Stopped by: Howard Pouch, DO   ondansetron  4 MG tablet Commonly known as: Zofran Stopped by: Howard Pouch, DO       TAKE these medications    atorvastatin 20 MG tablet Commonly known as: LIPITOR Take 1 tablet (20 mg total) by mouth daily.   Calcium 500 MG Chew Chew by mouth.   Collagenase Powd by Does not apply route.   diclofenac 75 MG EC tablet Commonly known as: VOLTAREN Take 1 tablet (75 mg total) by mouth daily.   ELDERBERRY PO Take by mouth.   exemestane 25 MG tablet Commonly known as: AROMASIN Take 1 tablet (25 mg total) by mouth daily after breakfast.   methocarbamol 500 MG tablet Commonly known as: ROBAXIN Take 1 tablet (500 mg total) by mouth every 8 (eight) hours as needed for muscle spasms. Started by: Howard Pouch, DO   naproxen 500 MG tablet Commonly known as: Naprosyn Take 1 tablet (500 mg total) by mouth 2 (two) times daily with a meal. Started by: Howard Pouch, DO   psyllium 58.6 % powder Commonly known as: METAMUCIL Take 1 packet by mouth daily.   Vitamin D (Cholecalciferol) 25 MCG (1000 UT) Caps Take 1 capsule by mouth daily.        All past medical history, surgical history, allergies, family history, immunizations andmedications were updated in the EMR today and reviewed under the history and medication portions of their EMR.     ROS Negative, with the exception of above mentioned in HPI   Objective:  BP 134/87   Pulse 75   Temp 98.2 F (36.8 C)   Wt 143 lb 6.4 oz (65 kg)   SpO2 97%   BMI 28.01 kg/m  Body mass index is 28.01 kg/m. Physical Exam Vitals and nursing note reviewed.  Constitutional:      General: She is not in acute  distress.    Appearance: Normal appearance. She is normal weight. She is not ill-appearing or toxic-appearing.  HENT:     Head: Normocephalic.     Comments: Facial bruising bruising left upper eyelid to zygomatic arch  Eyes:     Extraocular Movements: Extraocular movements intact.     Conjunctiva/sclera: Conjunctivae normal.     Pupils: Pupils are equal, round, and reactive to light.  Musculoskeletal:     Comments: Patient has cervical collar and right wrist splint.  Skin:    Findings: Bruising present.  Neurological:     Mental Status: She is alert and oriented to person, place, and time. Mental status is at baseline.  Psychiatric:        Mood and Affect: Mood normal.        Behavior: Behavior normal.        Thought Content: Thought content normal.        Judgment: Judgment normal.     No results found. No results found. No results found for this or any previous visit (from the past 24 hour(s)).  Assessment/Plan: Olivia [name] is a 61 y.o. female present for OV for  Closed nondisplaced fracture of triquetrum of right wrist with routine healing, subsequent encounter Continue wrist splint use daily. Has follow-up with orthopedic this Friday. Pain well controlled  Fracture of vertebra due to osteoporosis with routine healing, subsequent encounter Rarely needing opiate at this time. Start naproxen twice daily with food as needed for pain Start Robaxin 3 times daily as needed for muscle spasms.  Sedation caution discussed Continue cervical collar for 4 weeks.  Per neuro team may take off to shower only. Has  follow-up with neuro team in 4 weeks.  Vasovagal syncope Per her description it certainly sounds like a vasovagal syncope.  Reviewed CT and MRI results today.  We discussed adequate hydration and blood pressure levels are good. We discussed pausing to gain balance after her standing/transitioning positions, before walking. She is aware of dizziness prior to both episodes  of syncope.  She is established with cardiology.  CT of the head is reassuring.   Influenza vaccine administered today  Reviewed expectations re: course of current medical issues. Discussed self-management of symptoms. Outlined signs and symptoms indicating need for more acute intervention. Patient verbalized understanding and all questions were answered. Patient received an After-Visit Summary.    Orders Placed This Encounter  Procedures   Flu Vaccine QUAD 93mo+IM (Fluarix, Fluzone & Alfiuria Quad PF)   Meds ordered this encounter  Medications   methocarbamol (ROBAXIN) 500 MG tablet    Sig: Take 1 tablet (500 mg total) by mouth every 8 (eight) hours as needed for muscle spasms.    Dispense:  90 tablet    Refill:  1   naproxen (NAPROSYN) 500 MG tablet    Sig: Take 1 tablet (500 mg total) by mouth 2 (two) times daily with a meal.    Dispense:  60 tablet    Refill:  0   Referral Orders  No referral(s) requested today     Note is dictated utilizing voice recognition software. Although note has been proof read prior to signing, occasional typographical errors still can be missed. If any questions arise, please do not hesitate to call for verification.   electronically signed by:  Howard Pouch, DO  [place]

## 2021-11-29 ENCOUNTER — Inpatient Hospital Stay: Payer: BC Managed Care – PPO

## 2021-11-29 ENCOUNTER — Inpatient Hospital Stay: Payer: BC Managed Care – PPO | Admitting: Hematology

## 2021-11-29 DIAGNOSIS — M25531 Pain in right wrist: Secondary | ICD-10-CM | POA: Diagnosis not present

## 2021-11-29 DIAGNOSIS — S62114A Nondisplaced fracture of triquetrum [cuneiform] bone, right wrist, initial encounter for closed fracture: Secondary | ICD-10-CM | POA: Diagnosis not present

## 2021-12-03 ENCOUNTER — Encounter: Payer: Self-pay | Admitting: Gastroenterology

## 2021-12-10 ENCOUNTER — Ambulatory Visit: Payer: BC Managed Care – PPO | Admitting: Plastic Surgery

## 2021-12-10 DIAGNOSIS — C50211 Malignant neoplasm of upper-inner quadrant of right female breast: Secondary | ICD-10-CM | POA: Diagnosis not present

## 2021-12-10 DIAGNOSIS — Z9013 Acquired absence of bilateral breasts and nipples: Secondary | ICD-10-CM | POA: Diagnosis not present

## 2021-12-10 DIAGNOSIS — Z170 Estrogen receptor positive status [ER+]: Secondary | ICD-10-CM | POA: Diagnosis not present

## 2021-12-10 NOTE — Progress Notes (Signed)
   Subjective:    Patient ID: Olivia [name], female    DOB: 1960-09-16, 61 y.o.   MRN: [id number]  The patient is a 61 year old female here for her 13-monthfollow-up for breast reconstruction.  The patient was diagnosed with breast cancer in 2018.  She was found to have invasive ductal carcinoma of the right upper inner quadrant it was estrogen and progesterone positive and HER2 negative.  She is 5 feet 4 inches tall.  Her past surgical history includes bilateral mastectomies and hysterectomy.  Prior to the right mastectomy she had had a partial mastectomy in 2003 with radiation.  In October 2018 she had bilateral mastectomies with immediate reconstruction.  The exchange to implants was done in January 2019 and she has Mentor smooth round high-profile gel 350 cc implants in place.  In January 2022 she had lipo filling for improved symmetry on both breasts.  In April 2023 she had more lipo filling.  She is doing really well and happy with her results.  She has had nipple areolar reconstruction as well.  There are no signs of concerns and her implants appear to be intact with no capsular contracture.  The patient has had 3 episodes of passing out and has sustained a cervical fracture.  Today she is in a [place] J collar.  She is being worked up but so far they have not found a cause for her syncope.      Review of Systems  Constitutional:  Positive for activity change. Negative for appetite change.  HENT: Negative.    Eyes: Negative.   Respiratory: Negative.  Negative for chest tightness and shortness of breath.   Cardiovascular: Negative.   Gastrointestinal: Negative.   Endocrine: Negative.   Genitourinary: Negative.   Musculoskeletal: Negative.   Skin: Negative.        Objective:   Physical Exam Cardiovascular:     Rate and Rhythm: Normal rate.     Pulses: Normal pulses.  Pulmonary:     Effort: Pulmonary effort is normal.  Skin:    General: Skin is warm.     Capillary Refill: Capillary  refill takes less than 2 seconds.     Coloration: Skin is not jaundiced.     Findings: Bruising present. No lesion.  Neurological:     Mental Status: She is alert and oriented to person, place, and time.  Psychiatric:        Mood and Affect: Mood normal.        Behavior: Behavior normal.        Thought Content: Thought content normal.        Judgment: Judgment normal.        Assessment & Plan:     ICD-10-CM   1. Acquired absence of bilateral breasts and nipples  Z90.13     2. Malignant neoplasm of upper-inner quadrant of right breast in female, estrogen receptor positive ([place]  C50.211    Z17.0       No pictures were taken today. Continue massage and sports bra as able.  Return in 6 months for follow-up.

## 2021-12-18 DIAGNOSIS — S12691A Other nondisplaced fracture of seventh cervical vertebra, initial encounter for closed fracture: Secondary | ICD-10-CM | POA: Diagnosis not present

## 2021-12-18 DIAGNOSIS — Z6825 Body mass index (BMI) 25.0-25.9, adult: Secondary | ICD-10-CM | POA: Diagnosis not present

## 2021-12-23 DIAGNOSIS — L814 Other melanin hyperpigmentation: Secondary | ICD-10-CM | POA: Diagnosis not present

## 2021-12-23 DIAGNOSIS — D225 Melanocytic nevi of trunk: Secondary | ICD-10-CM | POA: Diagnosis not present

## 2021-12-23 DIAGNOSIS — L821 Other seborrheic keratosis: Secondary | ICD-10-CM | POA: Diagnosis not present

## 2021-12-23 DIAGNOSIS — L578 Other skin changes due to chronic exposure to nonionizing radiation: Secondary | ICD-10-CM | POA: Diagnosis not present

## 2021-12-30 ENCOUNTER — Inpatient Hospital Stay: Payer: BC Managed Care – PPO | Attending: Hematology

## 2021-12-30 ENCOUNTER — Encounter: Payer: Self-pay | Admitting: Hematology

## 2021-12-30 ENCOUNTER — Inpatient Hospital Stay (HOSPITAL_BASED_OUTPATIENT_CLINIC_OR_DEPARTMENT_OTHER): Payer: BC Managed Care – PPO | Admitting: Hematology

## 2021-12-30 VITALS — BP 143/96 | HR 68 | Temp 98.0°F | Ht 60.0 in | Wt 145.5 lb

## 2021-12-30 DIAGNOSIS — C50211 Malignant neoplasm of upper-inner quadrant of right female breast: Secondary | ICD-10-CM

## 2021-12-30 DIAGNOSIS — Z170 Estrogen receptor positive status [ER+]: Secondary | ICD-10-CM | POA: Diagnosis not present

## 2021-12-30 DIAGNOSIS — Z79811 Long term (current) use of aromatase inhibitors: Secondary | ICD-10-CM | POA: Diagnosis not present

## 2021-12-30 DIAGNOSIS — M85851 Other specified disorders of bone density and structure, right thigh: Secondary | ICD-10-CM | POA: Diagnosis not present

## 2021-12-30 DIAGNOSIS — Z9013 Acquired absence of bilateral breasts and nipples: Secondary | ICD-10-CM | POA: Diagnosis not present

## 2021-12-30 LAB — CBC WITH DIFFERENTIAL (CANCER CENTER ONLY)
Abs Immature Granulocytes: 0.01 10*3/uL (ref 0.00–0.07)
Basophils Absolute: 0 10*3/uL (ref 0.0–0.1)
Basophils Relative: 1 %
Eosinophils Absolute: 0.1 10*3/uL (ref 0.0–0.5)
Eosinophils Relative: 2 %
HCT: 41.1 % (ref 36.0–46.0)
Hemoglobin: 14.1 g/dL (ref 12.0–15.0)
Immature Granulocytes: 0 %
Lymphocytes Relative: 37 %
Lymphs Abs: 1.6 10*3/uL (ref 0.7–4.0)
MCH: 30.1 pg (ref 26.0–34.0)
MCHC: 34.3 g/dL (ref 30.0–36.0)
MCV: 87.8 fL (ref 80.0–100.0)
Monocytes Absolute: 0.4 10*3/uL (ref 0.1–1.0)
Monocytes Relative: 9 %
Neutro Abs: 2.2 10*3/uL (ref 1.7–7.7)
Neutrophils Relative %: 51 %
Platelet Count: 222 10*3/uL (ref 150–400)
RBC: 4.68 MIL/uL (ref 3.87–5.11)
RDW: 12.9 % (ref 11.5–15.5)
WBC Count: 4.2 10*3/uL (ref 4.0–10.5)
nRBC: 0 % (ref 0.0–0.2)

## 2021-12-30 LAB — CMP (CANCER CENTER ONLY)
ALT: 18 U/L (ref 0–44)
AST: 19 U/L (ref 15–41)
Albumin: 5 g/dL (ref 3.5–5.0)
Alkaline Phosphatase: 108 U/L (ref 38–126)
Anion gap: 7 (ref 5–15)
BUN: 18 mg/dL (ref 6–20)
CO2: 30 mmol/L (ref 22–32)
Calcium: 10.2 mg/dL (ref 8.9–10.3)
Chloride: 104 mmol/L (ref 98–111)
Creatinine: 1.03 mg/dL — ABNORMAL HIGH (ref 0.44–1.00)
GFR, Estimated: >60 mL/min (ref >60)
Glucose, Bld: 102 mg/dL — ABNORMAL HIGH (ref 70–99)
Potassium: 4 mmol/L (ref 3.5–5.1)
Sodium: 141 mmol/L (ref 135–145)
Total Bilirubin: 0.5 mg/dL (ref 0.3–1.2)
Total Protein: 7.2 g/dL (ref 6.5–8.1)

## 2021-12-30 NOTE — Progress Notes (Signed)
[place]   [phone] [phone]   Clinic Follow up Note   Patient Care Team: Ma Hillock, DO as PCP - General (Family Medicine) Truitt Merle, MD as Consulting Physician (Hematology) Jovita Kussmaul, MD as Consulting Physician (General Surgery) Gery Pray, MD as Consulting Physician (Radiation Oncology) Sable Feil, MD as Consulting Physician (Gastroenterology) Molli Posey, MD as Consulting Physician (Obstetrics and Gynecology) Delice Bison Charlestine Massed, NP as Nurse Practitioner (Hematology and Oncology) Elayne Snare, MD as Consulting Physician (Endocrinology)  Date of Service:  12/30/2021  CHIEF COMPLAINT: f/u of right breast cancer  CURRENT THERAPY:  Antiestrogen therapy, starting 12/18/16. Currently exemestane since 11/2019  ASSESSMENT & PLAN:  Olivia [name] is a 61 y.o. post-hysterectomy female with   1. Malignant neoplasm of upper-inner quadrant of right breast, invasive ductal carcinoma, pT2N0, M0), Stage 1B, ER/PR: positive, HER2 negative, Grade 3, Oncotype RS 21 -diagnosed 10/2016, s/p bilateral mastectomy and reconstruction. RS low risk.  -started adjuvant letrozole 12/2016, changed to exemestane 11/2019 due to arthralgia. She is completing 5 years this month. -Due to her osteoporosis and recent fracture, I do not recommend extended antiestrogen therapy. -She understands that there is a small risk of later recurrence.  Continue cancer surveillance.  2.  Osteoporosis  -initially on fosamax x5 years, then on prolia q6 months 03/2015 through 11/2020.  -repeat DEXA 11/14/21 showed worsening with T-score of -2.4 at right femur neck. -she fell on 11/24/21 and fractured C7 -I recommend 2 years Zometa (every 6 months), will discuss with her PCP too  -Potential benefit, including reduce risk of bone metastasis from breast cancer, and side effects, especially jaw necrosis, were discussed with her in detail, she agrees to proceed.     PLAN: -she is clinically doing well, no breast concerns. -labs reviewed, overall WNL except creatinine slightly elevated at 1.03. -we discussed biphosphatase infusion with zometa, plan to start in 2 weeks  -given her worsening bone density, we will stop exemestane after her current fill. -lab and f/u in 7 months with second dose zometa    No problem-specific Assessment & Plan notes found for this encounter.   SUMMARY OF ONCOLOGIC HISTORY: Oncology History Overview Note  Cancer Staging Malignant neoplasm of upper-inner quadrant of right breast in female, estrogen receptor positive ([place]) Staging form: Breast, AJCC 8th Edition - Clinical stage from 10/09/2016: Stage IA (cT1c, cN0, cM0, G3, ER: Positive, PR: Positive, HER2: Negative) - Signed by Truitt Merle, MD on 10/14/2016 - Pathologic stage from 11/17/2016: Stage IB (pT2, pN0, cM0, G3, ER: Positive, PR: Positive, HER2: Negative, Oncotype DX score: 21) - Signed by Truitt Merle, MD on 12/11/2016     Malignant neoplasm of upper-inner quadrant of right breast in female, estrogen receptor positive ([place])  10/08/2016 Mammogram   Diagnostic mammogram and ultrasound showed an irregular mass in the right breast at 1 o'clock, 5 cm from the nipple measuring 1.6 x 1.4x 1.6 cm, axilla was negative for adenopathy.   10/09/2016 Receptors her2   Estrogen Receptor: 80%, POSITIVE, STRONG STAINING INTENSITY Progesterone Receptor: 30%, POSITIVE, STRONG STAINING INTENSITY Proliferation Marker Ki67: 35% HER2 NEGATIVE    10/09/2016 Initial Diagnosis   Malignant neoplasm of upper-inner quadrant of right breast in female, estrogen receptor positive ([place])   10/09/2016 Initial Biopsy   Diagnosis Breast, right, needle core biopsy, upper inner quadrant, 1:00 o'clock - INVASIVE DUCTAL CARCINOMA, G3   10/2016 Genetic Testing   Genetic testing was negative for The genes analyzed were the 23 genes  on [facility]'s Breast/GYN panel (ATM, BARD1, BRCA1, BRCA2, BRIP1, CDH1,  CHEK2, DICER1, EPCAM, MLH1,  MSH2, MSH6, NBN, NF1, PALB2, PMS2, PTEN, RAD50, RAD51C, RAD51D,SMARCA4, STK11, and TP53).     11/17/2016 Surgery   RIGHT MASTECTOMY AND LEFT PROPHYLATIC MASTECTOMY by Dr. Marlou Starks  IMEDITATE BREAST RECONSTRUCTION WITH PLACEMENT OF TISSUE EXPANDER AND FLEX HD (ACELLULAR HYDRATED DERMIS) by Dr. Marla Roe     11/17/2016 Pathology Results   Diagnosis 11/17/16 1. Breast, simple mastectomy, Left - BENIGN BREAST TISSUE WITH ECTATIC DUCTS. - ONE OF ONE LYMPH NODE NEGATIVE FOR CARCINOMA (0/1). 2. Breast, simple mastectomy, Right - INVASIVE DUCTAL CARCINOMA, GRADE 3, SPANNING 2.4 CM. - HIGH GRADE DUCTAL CARCINOMA IN SITU. - INVASIVE CARCINOMA IS <0.1 CM OF THE POSTERIOR MARGIN (BROADLY) AND ANTERIOR MARGIN (FOCALLY). - IN SITU CARCINOMA IS >0.2 CM OF BOTH MARGINS. - PRIOR LUMPECTOMY SITE, NEGATIVE FOR CARCINOMA. - SEE ONCOLOGY TABLE. 3. Breast, excision, Superior flap deep margin - BENIGN FIBROADIPOSE TISSUE.   11/17/2016 Miscellaneous   Oncotype on 11/17/16 Rcurence Risk Score of 21 10-Year risk of distant reucrrence with Tamoxifen alone is 13%    12/18/2016 -  Anti-estrogen oral therapy   Adjuvant Letrozole 2.5 mg daily; began 12/18/16. Due to arthritis I switched her to Exemestane 74m daily in 11/2019.    12/24/2016 Imaging   BONE SCAN IMPRESSION: No evidence of osseous metastatic disease.   12/24/2016 Imaging   CT CAP IMPRESSION: 1. Status post bilateral modified radical mastectomy and breast reconstruction, as above. No findings to suggest metastatic disease in the chest, abdomen or pelvis. 2. Multiple heterogeneous appearing thyroid nodules, as discussed above. These are nonspecific but further evaluation with nonemergent thyroid ultrasound is recommended in the near future to determine any potential need for further evaluation with fine-needle aspiration. 3. Incidental findings, as above.   02/11/2017 Surgery   REMOVAL OF BILATERAL TISSUE  EXPANDERS WITH PLACEMENT OF BILATERAL SILICONE BREAST IMPLANTS by Dr. DMarla Roe      INTERVAL HISTORY:  Olivia SPARANOis here for a follow up of breast cancer. She was last seen by NP Lacie on 05/27/21. She presents to the clinic alone. She told me about her fall about a month ago. She explains she fell as she was getting out of bed and hit her left face. She reports she fractured her C7 and required a neck brace for several weeks. She is no longer wearing today.   All other systems were reviewed with the patient and are negative.  MEDICAL HISTORY:  Past Medical History:  Diagnosis Date   Adjustment insomnia 02/23/2019   Breast cancer ([place] 2018   Lumpectomy, chemotherapy and radiation with recurrence 2018. Now on Femara   Cervical radiculopathy 07/05/2019   COVID-19 02/2019   Her husband also passed away at this time from COVID-19.   Family history of breast cancer    Genetic testing 10/30/2016   Breast/GYN panel (23 genes) @ [facility] - No pathogenic mutations detected   GERD (gastroesophageal reflux disease)    OTC medication   Grief reaction 02/23/2019   Heart murmur    as a child - Echo in early 20s "ok"   History of echocardiogram 05/2015   Echo 4/17: EF 55-60%, GLS -19.6%, normal wall motion   History of exercise stress test 05/2015   ETT 4/17 - No ST changes   HLD (hyperlipidemia)    Hypothyroidism    Multiple thyroid nodules 2009   Multiple nodules: Normal ultrasound reported at diagnosis   Osteoporosis  Personal history of chemotherapy    Personal history of radiation therapy    UNEQUAL LEG LENGTH, ACQUIRED 11/17/2006   Qualifier: Diagnosis of  By: Oneida Alar MD, KARL      SURGICAL HISTORY: Past Surgical History:  Procedure Laterality Date   ABDOMINAL HYSTERECTOMY     BREAST LUMPECTOMY Right 2003   BREAST RECONSTRUCTION WITH PLACEMENT OF TISSUE EXPANDER AND FLEX HD (ACELLULAR HYDRATED DERMIS) Bilateral 11/17/2016   Procedure: IMEDITATE BREAST RECONSTRUCTION  WITH PLACEMENT OF TISSUE EXPANDER AND FLEX HD (ACELLULAR HYDRATED DERMIS);  Surgeon: Wallace Going, DO;  Location: [facility];  Service: Plastics;  Laterality: Bilateral;   BREAST SURGERY  2003 /right   lumpectomy; chemotherapy and radiation   LIPOSUCTION WITH LIPOFILLING Bilateral 02/23/2020   Procedure: Fat filling of breasts for symmetry from abdomen;  Surgeon: Wallace Going, DO;  Location: [facility];  Service: Plastics;  Laterality: Bilateral;  90 min, please   LIPOSUCTION WITH LIPOFILLING Bilateral 05/16/2021   Procedure: LIPOSUCTION WITH LIPOFILLING;  Surgeon: Wallace Going, DO;  Location: [facility];  Service: Plastics;  Laterality: Bilateral;  1.5 hours   REMOVAL OF BILATERAL TISSUE EXPANDERS WITH PLACEMENT OF BILATERAL BREAST IMPLANTS Bilateral 02/11/2017   Procedure: REMOVAL OF BILATERAL TISSUE EXPANDERS WITH PLACEMENT OF BILATERAL SILICONE BREAST IMPLANTS;  Surgeon: Wallace Going, DO;  Location: [facility];  Service: Plastics;  Laterality: Bilateral;   TOTAL ABDOMINAL HYSTERECTOMY W/ BILATERAL SALPINGOOPHORECTOMY     for treatment of breast treatment   TOTAL MASTECTOMY Bilateral 11/17/2016   Procedure: RIGHT MASTECTOMY AND LEFT PROPHYLATIC MASTECTOMY;  Surgeon: Jovita Kussmaul, MD;  Location: [facility];  Service: General;  Laterality: Bilateral;    I have reviewed the social history and family history with the patient and they are unchanged from previous note.  ALLERGIES:  has No Known Allergies.  MEDICATIONS:  Current Outpatient Medications  Medication Sig Dispense Refill   atorvastatin (LIPITOR) 20 MG tablet Take 1 tablet (20 mg total) by mouth daily. 90 tablet 3   Calcium 500 MG CHEW Chew by mouth.     Collagenase POWD by Does not apply route.     diclofenac (VOLTAREN) 75 MG EC tablet Take 1 tablet (75 mg total) by mouth daily. 90 tablet 3   ELDERBERRY PO Take by mouth.     exemestane (AROMASIN) 25 MG tablet Take 1  tablet (25 mg total) by mouth daily after breakfast. 90 tablet 1   methocarbamol (ROBAXIN) 500 MG tablet Take 1 tablet (500 mg total) by mouth every 8 (eight) hours as needed for muscle spasms. 90 tablet 1   naproxen (NAPROSYN) 500 MG tablet Take 1 tablet (500 mg total) by mouth 2 (two) times daily with a meal. 60 tablet 0   psyllium (METAMUCIL) 58.6 % powder Take 1 packet by mouth daily.     Vitamin D, Cholecalciferol, 25 MCG (1000 UT) CAPS Take 1 capsule by mouth daily.     No current facility-administered medications for this visit.    PHYSICAL EXAMINATION: ECOG PERFORMANCE STATUS: 0 - Asymptomatic  Vitals:   12/30/21 0857  BP: (!) 143/96  Pulse: 68  Temp: 98 F (36.7 C)  SpO2: 100%   Wt Readings from Last 3 Encounters:  12/30/21 145 lb 8 oz (66 kg)  11/27/21 143 lb 6.4 oz (65 kg)  11/24/21 140 lb (63.5 kg)     GENERAL:alert, no distress and comfortable SKIN: skin color, texture, turgor are normal, no rashes or significant lesions  EYES: normal, Conjunctiva are pink and non-injected, sclera clear  NECK: supple, thyroid normal size, non-tender, without nodularity LYMPH:  no palpable lymphadenopathy in the cervical, axillary LUNGS: clear to auscultation and percussion with normal breathing effort HEART: regular rate & rhythm and no murmurs and no lower extremity edema ABDOMEN:abdomen soft, non-tender and normal bowel sounds Musculoskeletal:no cyanosis of digits and no clubbing  NEURO: alert & oriented x 3 with fluent speech, no focal motor/sensory deficits BREAST: No palpable mass, nodules or adenopathy bilaterally. Breast exam benign.   LABORATORY DATA:  I have reviewed the data as listed    Latest Ref Rng & Units 12/30/2021    8:22 AM 11/24/2021    4:41 PM 05/23/2021    9:07 AM  CBC  WBC 4.0 - 10.5 K/uL 4.2  8.3  5.5   Hemoglobin 12.0 - 15.0 g/dL 14.1  15.0  13.5   Hematocrit 36.0 - 46.0 % 41.1  44.8  40.3   Platelets 150 - 400 K/uL 222  286  269.0          Latest Ref Rng & Units 12/30/2021    8:22 AM 11/24/2021    4:41 PM 05/15/2021   12:23 PM  CMP  Glucose 70 - 99 mg/dL 102  106  106   BUN 6 - 20 mg/dL _0 Creatinine 0.44 - 1.00 mg/dL 1.03  0.83  1.01   Sodium 135 - 145 mmol/L 141  137  139   Potassium 3.5 - 5.1 mmol/L 4.0  3.6  4.3   Chloride 98 - 111 mmol/L 104  102  104   CO2 22 - 32 mmol/L _1 Calcium 8.9 - 10.3 mg/dL 10.2  9.5  9.6   Total Protein 6.5 - 8.1 g/dL 7.2  8.1    Total Bilirubin 0.3 - 1.2 mg/dL 0.5  0.9    Alkaline Phos 38 - 126 U/L 108  113    AST 15 - 41 U/L 19  35    ALT 0 - 44 U/L 18  28        RADIOGRAPHIC STUDIES: I have personally reviewed the radiological images as listed and agreed with the findings in the report. No results found.    No orders of the defined types were placed in this encounter.  All questions were answered. The patient knows to call the clinic with any problems, questions or concerns. No barriers to learning was detected. The total time spent in the appointment was 30 minutes.     Truitt Merle, MD 12/30/2021   I, Wilburn Mylar, am acting as scribe for Truitt Merle, MD.   I have reviewed the above documentation for accuracy and completeness, and I agree with the above.

## 2022-01-01 ENCOUNTER — Ambulatory Visit: Payer: BC Managed Care – PPO | Attending: Neurological Surgery | Admitting: Physical Therapy

## 2022-01-01 ENCOUNTER — Other Ambulatory Visit: Payer: Self-pay

## 2022-01-01 ENCOUNTER — Encounter: Payer: Self-pay | Admitting: Physical Therapy

## 2022-01-01 DIAGNOSIS — R29898 Other symptoms and signs involving the musculoskeletal system: Secondary | ICD-10-CM | POA: Insufficient documentation

## 2022-01-01 DIAGNOSIS — M542 Cervicalgia: Secondary | ICD-10-CM | POA: Diagnosis not present

## 2022-01-01 NOTE — Addendum Note (Signed)
Addended by: Kennith Gain on: 01/01/2022 10:19 AM   Modules accepted: Orders

## 2022-01-01 NOTE — Therapy (Signed)
OUTPATIENT PHYSICAL THERAPY CERVICAL EVALUATION   Patient Name: Olivia [name] MRN: [id number] DOB:1960-08-06, 61 y.o., female Today's Date: 01/01/2022  END OF SESSION:  PT End of Session - 01/01/22 1016     Visit Number 1    Number of Visits 6    Date for PT Re-Evaluation 02/12/22    PT Start Time 0930    PT Stop Time 1005    PT Time Calculation (min) 35 min    Activity Tolerance Patient tolerated treatment well    Behavior During Therapy [facility] for tasks assessed/performed             Past Medical History:  Diagnosis Date   Adjustment insomnia 02/23/2019   Breast cancer ([place]) 2018   Lumpectomy, chemotherapy and radiation with recurrence 2018. Now on Femara   Cervical radiculopathy 07/05/2019   COVID-19 02/2019   Her husband also passed away at this time from COVID-19.   Family history of breast cancer    Genetic testing 10/30/2016   Breast/GYN panel (23 genes) @ [facility] - No pathogenic mutations detected   GERD (gastroesophageal reflux disease)    OTC medication   Grief reaction 02/23/2019   Heart murmur    as a child - Echo in early 20s "ok"   History of echocardiogram 05/2015   Echo 4/17: EF 55-60%, GLS -19.6%, normal wall motion   History of exercise stress test 05/2015   ETT 4/17 - No ST changes   HLD (hyperlipidemia)    Hypothyroidism    Multiple thyroid nodules 2009   Multiple nodules: Normal ultrasound reported at diagnosis   Osteoporosis    Personal history of chemotherapy    Personal history of radiation therapy    UNEQUAL LEG LENGTH, ACQUIRED 11/17/2006   Qualifier: Diagnosis of  By: Oneida Alar MD, KARL     Past Surgical History:  Procedure Laterality Date   ABDOMINAL HYSTERECTOMY     BREAST LUMPECTOMY Right 2003   BREAST RECONSTRUCTION WITH PLACEMENT OF TISSUE EXPANDER AND FLEX HD (ACELLULAR HYDRATED DERMIS) Bilateral 11/17/2016   Procedure: IMEDITATE BREAST RECONSTRUCTION WITH PLACEMENT OF TISSUE EXPANDER AND FLEX HD (ACELLULAR HYDRATED DERMIS);   Surgeon: Wallace Going, DO;  Location: [facility];  Service: Plastics;  Laterality: Bilateral;   BREAST SURGERY  2003 /right   lumpectomy; chemotherapy and radiation   LIPOSUCTION WITH LIPOFILLING Bilateral 02/23/2020   Procedure: Fat filling of breasts for symmetry from abdomen;  Surgeon: Wallace Going, DO;  Location: [facility];  Service: Plastics;  Laterality: Bilateral;  90 min, please   LIPOSUCTION WITH LIPOFILLING Bilateral 05/16/2021   Procedure: LIPOSUCTION WITH LIPOFILLING;  Surgeon: Wallace Going, DO;  Location: [facility];  Service: Plastics;  Laterality: Bilateral;  1.5 hours   REMOVAL OF BILATERAL TISSUE EXPANDERS WITH PLACEMENT OF BILATERAL BREAST IMPLANTS Bilateral 02/11/2017   Procedure: REMOVAL OF BILATERAL TISSUE EXPANDERS WITH PLACEMENT OF BILATERAL SILICONE BREAST IMPLANTS;  Surgeon: Wallace Going, DO;  Location: [facility];  Service: Plastics;  Laterality: Bilateral;   TOTAL ABDOMINAL HYSTERECTOMY W/ BILATERAL SALPINGOOPHORECTOMY     for treatment of breast treatment   TOTAL MASTECTOMY Bilateral 11/17/2016   Procedure: RIGHT MASTECTOMY AND LEFT PROPHYLATIC MASTECTOMY;  Surgeon: Jovita Kussmaul, MD;  Location: [facility];  Service: General;  Laterality: Bilateral;   Patient Active Problem List   Diagnosis Date Noted   Fracture of vertebra due to osteoporosis ([place]) 11/27/2021   Nondisplaced fracture of triquetral bone of right wrist with routine  healing 11/27/2021   Vasovagal syncope 11/27/2021   On statin therapy 05/23/2021   Mixed hyperlipidemia 05/23/2021   Essential hypertension 08/10/2020   Hypercholesterolemia 01/02/2020   Status post bilateral breast reconstruction 06/10/2019   Arthritis 04/22/2018   Acquired absence of bilateral breasts and nipples 11/24/2016   Family history of breast cancer    Malignant neoplasm of upper-inner quadrant of right breast in female, estrogen receptor positive ([place])  10/14/2016   Mixed emotional features as adjustment reaction 11/09/2015   Encounter for preventive health examination 12/26/2014   Multiple thyroid nodules 12/26/2014   Osteoporosis 12/26/2014   Hypothyroidism 11/17/2006    PCP: Howard Pouch  REFERRING PROVIDER: Dawley, Rennis Golden DIAG: nondisplaced fracture of 7th cervical vertebrae  THERAPY DIAG:  Neck pain  Other symptoms and signs involving the musculoskeletal system  Rationale for Evaluation and Treatment: Rehabilitation  ONSET DATE: 10/23  SUBJECTIVE:                                                                                                                                                                                                         SUBJECTIVE STATEMENT: Pt got dizzy while getting up in the night and fell. She fractured C7 in her neck as well as her wrist. She wore a cervical brace x 4 weeks which she got off 2 weeks ago. She reports her neck still feels very stiff. She is having difficulty sleeping due to pain.   PERTINENT HISTORY:  None reported  PAIN:  Are you having pain? Yes: NPRS scale: 2/10 Pain location: neck Pain description: stiff Aggravating factors: laying on pillow Relieving factors: heat  PRECAUTIONS: None  WEIGHT BEARING RESTRICTIONS: No  FALLS:  Has patient fallen in last 6 months? Yes. Number of falls 1   OCCUPATION: Clinical cytogeneticist  PLOF: Independent  PATIENT GOALS: be less stiff, get back to normal exercise routine  NEXT MD VISIT:   OBJECTIVE:   DIAGNOSTIC FINDINGS:  1. Anterior inferior C7 vertebral body fracture. 2. Prevertebral soft tissue swelling at the C7 level. 3. Uncovertebral spurring results in bilateral foraminal stenosis at C5-6 and C6-7.  PATIENT SURVEYS:  FOTO 65  COGNITION: Overall cognitive status: Within functional limits for tasks assessed    PALPATION: Increased mm spasticity Lt > Rt cervical paraspinals, suboccipitals and upper  traps   CERVICAL ROM:   Active ROM A/PROM (deg) eval  Flexion 45  Extension 35  Right lateral flexion 22  Left lateral flexion 18  Right rotation 40  Left rotation 25   (Blank rows = not tested)  UPPER EXTREMITY MMT:  MMT Right eval Left eval  Shoulder flexion 4 4  Shoulder extension    Shoulder abduction 4 4  Shoulder adduction    Shoulder extension    Shoulder internal rotation    Shoulder external rotation    Middle trapezius    Lower trapezius    Elbow flexion    Elbow extension    Wrist flexion    Wrist extension    Wrist ulnar deviation    Wrist radial deviation    Wrist pronation    Wrist supination    Grip strength     (Blank rows = not tested)   TODAY'S TREATMENT:                                                                                                                              DATE: 11/29 See HEP   PATIENT EDUCATION:  Education details: PT POC and goals, HEP Person educated: Patient Education method: Explanation, Demonstration, and Handouts Education comprehension: verbalized understanding and returned demonstration  HOME EXERCISE PROGRAM: Access Code: [email or web address] URL: [email or web address] Date: 01/01/2022 Prepared by: Isabelle Course  Exercises - Seated Assisted Cervical Rotation with Towel  - 3 x daily - 7 x weekly - 1 sets - 3 reps - 20-30 seconds hold - Seated Upper Trapezius Stretch  - 3 x daily - 7 x weekly - 1 sets - 3 reps - 20-30 sec hold - Seated Cervical Retraction  - 1 x daily - 7 x weekly - 1 sets - 10 reps - 5 seconds hold  ASSESSMENT:  CLINICAL IMPRESSION: Patient is a 61 y.o. female who was seen today for physical therapy evaluation and treatment for nondisplaced fracture of 7th cervical vertebrae. Pt presents with decreased ROM, increased muscle spasticity, difficulty sleeping and decreased functional mobility. Pt will benefit from skilled PT to address deficits and improve functional mobility.    OBJECTIVE IMPAIRMENTS: decreased ROM and increased muscle spasms.   ACTIVITY LIMITATIONS: sleeping  PARTICIPATION LIMITATIONS: driving  PERSONAL FACTORS: Time since onset of injury/illness/exacerbation are also affecting patient's functional outcome.   REHAB POTENTIAL: Good  CLINICAL DECISION MAKING: Stable/uncomplicated  EVALUATION COMPLEXITY: Low   GOALS: Goals reviewed with patient? Yes  SHORT TERM GOALS: Target date: 01/15/2022    Pt will be independent in initial HEP Baseline:  Goal status: INITIAL  LONG TERM GOALS: Target date: 02/12/2022    Pt will be independent in advanced HEP Baseline:  Goal status: INITIAL  2.  Pt will improve cervical lateral flexion and rotation by 10 degrees each direction Baseline:  Goal status: INITIAL  3.  Pt will improve FOTO to >= 73 to demo improved functional mobility Baseline:  Goal status: INITIAL  4.  Pt will sleep x 6 hours without waking due to pain/stiffness Baseline:  Goal status: INITIAL     PLAN:  PT FREQUENCY: 1x/week  PT DURATION: 6 weeks  PLANNED INTERVENTIONS: Therapeutic exercises, Therapeutic activity, Neuromuscular re-education, Balance  training, Gait training, Patient/Family education, Self Care, Joint mobilization, Aquatic Therapy, Dry Needling, Electrical stimulation, Cryotherapy, Moist heat, Taping, Ionotophoresis '4mg'$ /ml Dexamethasone, Manual therapy, and Re-evaluation  PLAN FOR NEXT SESSION: assess and progress HEP for cervical mobility   Tiffiany Beadles, PT 01/01/2022, 10:17 AM

## 2022-01-06 ENCOUNTER — Ambulatory Visit (AMBULATORY_SURGERY_CENTER): Payer: BC Managed Care – PPO | Admitting: *Deleted

## 2022-01-06 VITALS — Ht 60.0 in | Wt 145.0 lb

## 2022-01-06 DIAGNOSIS — Z1211 Encounter for screening for malignant neoplasm of colon: Secondary | ICD-10-CM

## 2022-01-06 NOTE — Progress Notes (Signed)
No egg or soy allergy known to patient  No issues known to pt with past sedation with any surgeries or procedures Patient denies ever being told they had issues or difficulty with intubation  No FH of Malignant Hyperthermia Pt is not on diet pills Pt is not on  home 02  Pt is not on blood thinners  Pt denies issues with constipation  Hx of constipation Has issue instruted to no take metamusil. Instructed to use Miralax capful  Pt encouraged to use to use Singlecare or Goodrx to reduce cost   In person Coupon given Patient's chart reviewed by Osvaldo Angst CNRA prior to previsit and patient appropriate for the [place].  Previsit completed and red dot placed by patient's name on their procedure day (on provider's schedule).  Marland Kitchen

## 2022-01-07 ENCOUNTER — Encounter: Payer: Self-pay | Admitting: *Deleted

## 2022-01-07 ENCOUNTER — Telehealth: Payer: Self-pay | Admitting: *Deleted

## 2022-01-07 NOTE — Patient Instructions (Signed)
Visit Information  Thank you for taking time to visit with me today. Please don't hesitate to contact me if I can be of assistance to you.   Following are the goals we discussed today:   Goals Addressed               This Visit's Progress     COMPLETED: "All good" (pt-stated)        Care Coordination Interventions: Reviewed medications with patient and discussed adherence with no needed refills Reviewed scheduled/upcoming provider appointments including sufficient transportation source Assessed social determinant of health barriers         Please call the care guide team at [phone] if you need to cancel or reschedule your appointment.   If you are experiencing a Mental Health or [place] or need someone to talk to, please call the Suicide and Crisis Lifeline: 988 call the [place] National Suicide Prevention Lifeline: (416)218-0419 or TTY: (218)826-8856 TTY [phone]) to talk to a trained counselor call 1-800-273-TALK (toll free, 24 hour hotline)  Patient verbalizes understanding of instructions and care plan provided today and agrees to view in [place]. Active MyChart status and patient understanding of how to access instructions and care plan via MyChart confirmed with patient.     No further follow up required: No further follow up needs.   Raina Mina, RN Care Management Coordinator [place] Office [phone]

## 2022-01-07 NOTE — Patient Outreach (Signed)
  Care Coordination   Initial Visit Note   01/07/2022 Name: TANICKA BISAILLON MRN: [id number] DOB: 10-07-1960  GORGEOUS NEWLUN is a 61 y.o. year old female who sees Kuneff, Renee A, DO for primary care. I spoke with  Collier Flowers by phone today.  What matters to the patients health and wellness today?  No needs, all good.    Goals Addressed               This Visit's Progress     COMPLETED: "All good" (pt-stated)        Care Coordination Interventions: Reviewed medications with patient and discussed adherence with no needed refills Reviewed scheduled/upcoming provider appointments including sufficient transportation source Assessed social determinant of health barriers         SDOH assessments and interventions completed:  Yes  SDOH Interventions Today    Flowsheet Row Most Recent Value  SDOH Interventions   Food Insecurity Interventions Intervention Not Indicated  Housing Interventions Intervention Not Indicated  Transportation Interventions Intervention Not Indicated  Utilities Interventions Intervention Not Indicated        Care Coordination Interventions:  Yes, provided   Follow up plan: No further intervention required.   Encounter Outcome:  Pt. Visit Completed   Raina Mina, RN Care Management Coordinator [place] Office [phone]

## 2022-01-08 ENCOUNTER — Ambulatory Visit: Payer: BC Managed Care – PPO | Attending: Neurological Surgery | Admitting: Physical Therapy

## 2022-01-08 ENCOUNTER — Encounter: Payer: Self-pay | Admitting: Physical Therapy

## 2022-01-08 DIAGNOSIS — M542 Cervicalgia: Secondary | ICD-10-CM | POA: Insufficient documentation

## 2022-01-08 DIAGNOSIS — R29898 Other symptoms and signs involving the musculoskeletal system: Secondary | ICD-10-CM | POA: Diagnosis not present

## 2022-01-08 NOTE — Therapy (Signed)
OUTPATIENT PHYSICAL THERAPY TREATMENT AND DISCHARGE   Patient Name: Olivia [name] MRN: [id number] DOB:May 04, 1960, 61 y.o., female Today's Date: 01/08/2022  END OF SESSION:  PT End of Session - 01/08/22 0913     Visit Number 2    Number of Visits 6    Date for PT Re-Evaluation 02/12/22    PT Start Time 0844    PT Stop Time 0908    PT Time Calculation (min) 24 min    Activity Tolerance Patient tolerated treatment well    Behavior During Therapy [facility] for tasks assessed/performed              Past Medical History:  Diagnosis Date   Adjustment insomnia 02/23/2019   Breast cancer ([place]) 2018   Lumpectomy, chemotherapy and radiation with recurrence 2018. Now on Femara   Cervical radiculopathy 07/05/2019   COVID-19 02/2019   Her husband also passed away at this time from COVID-19.   Family history of breast cancer    Genetic testing 10/30/2016   Breast/GYN panel (23 genes) @ [facility] - No pathogenic mutations detected   GERD (gastroesophageal reflux disease)    OTC medication   Grief reaction 02/23/2019   Heart murmur    as a child - Echo in early 20s "ok"   History of echocardiogram 05/2015   Echo 4/17: EF 55-60%, GLS -19.6%, normal wall motion   History of exercise stress test 05/2015   ETT 4/17 - No ST changes   HLD (hyperlipidemia)    Hypothyroidism    Multiple thyroid nodules 2009   Multiple nodules: Normal ultrasound reported at diagnosis   Osteoporosis    Personal history of chemotherapy    Personal history of radiation therapy    UNEQUAL LEG LENGTH, ACQUIRED 11/17/2006   Qualifier: Diagnosis of  By: Oneida Alar MD, KARL     Past Surgical History:  Procedure Laterality Date   ABDOMINAL HYSTERECTOMY     BREAST LUMPECTOMY Right 2003   BREAST RECONSTRUCTION WITH PLACEMENT OF TISSUE EXPANDER AND FLEX HD (ACELLULAR HYDRATED DERMIS) Bilateral 11/17/2016   Procedure: IMEDITATE BREAST RECONSTRUCTION WITH PLACEMENT OF TISSUE EXPANDER AND FLEX HD (ACELLULAR HYDRATED  DERMIS);  Surgeon: Wallace Going, DO;  Location: [place];  Service: Plastics;  Laterality: Bilateral;   BREAST SURGERY  2003 /right   lumpectomy; chemotherapy and radiation   LIPOSUCTION WITH LIPOFILLING Bilateral 02/23/2020   Procedure: Fat filling of breasts for symmetry from abdomen;  Surgeon: Wallace Going, DO;  Location: [place];  Service: Plastics;  Laterality: Bilateral;  90 min, please   LIPOSUCTION WITH LIPOFILLING Bilateral 05/16/2021   Procedure: LIPOSUCTION WITH LIPOFILLING;  Surgeon: Wallace Going, DO;  Location: [place];  Service: Plastics;  Laterality: Bilateral;  1.5 hours   REMOVAL OF BILATERAL TISSUE EXPANDERS WITH PLACEMENT OF BILATERAL BREAST IMPLANTS Bilateral 02/11/2017   Procedure: REMOVAL OF BILATERAL TISSUE EXPANDERS WITH PLACEMENT OF BILATERAL SILICONE BREAST IMPLANTS;  Surgeon: Wallace Going, DO;  Location: [place];  Service: Plastics;  Laterality: Bilateral;   TOTAL ABDOMINAL HYSTERECTOMY W/ BILATERAL SALPINGOOPHORECTOMY     for treatment of breast treatment   TOTAL MASTECTOMY Bilateral 11/17/2016   Procedure: RIGHT MASTECTOMY AND LEFT PROPHYLATIC MASTECTOMY;  Surgeon: Jovita Kussmaul, MD;  Location: [place];  Service: General;  Laterality: Bilateral;   Patient Active Problem List   Diagnosis Date Noted   Fracture of vertebra due to osteoporosis ([place]) 11/27/2021   Nondisplaced fracture of triquetral bone of right wrist  with routine healing 11/27/2021   Vasovagal syncope 11/27/2021   On statin therapy 05/23/2021   Mixed hyperlipidemia 05/23/2021   Essential hypertension 08/10/2020   Hypercholesterolemia 01/02/2020   Status post bilateral breast reconstruction 06/10/2019   Arthritis 04/22/2018   Acquired absence of bilateral breasts and nipples 11/24/2016   Family history of breast cancer    Malignant neoplasm of upper-inner quadrant of right breast in female, estrogen receptor positive ([place])  10/14/2016   Mixed emotional features as adjustment reaction 11/09/2015   Encounter for preventive health examination 12/26/2014   Multiple thyroid nodules 12/26/2014   Osteoporosis 12/26/2014   Hypothyroidism 11/17/2006    PCP: Howard Pouch  REFERRING PROVIDER: Dawley, Rennis Golden DIAG: nondisplaced fracture of 7th cervical vertebrae  THERAPY DIAG:  Neck pain  Other symptoms and signs involving the musculoskeletal system  Rationale for Evaluation and Treatment: Rehabilitation  ONSET DATE: 10/23  SUBJECTIVE:                                                                                                                                                                                                         SUBJECTIVE STATEMENT: Pt states she is feeling much less stiff. She has been able to adapt her gym routine to perform exercises without pain   PERTINENT HISTORY:  None reported  PAIN:  Are you having pain? Yes: NPRS scale: 0/10 Pain location: neck Pain description: stiff Aggravating factors: laying on pillow Relieving factors: heat  PRECAUTIONS: None  WEIGHT BEARING RESTRICTIONS: No  FALLS:  Has patient fallen in last 6 months? Yes. Number of falls 1   PATIENT GOALS: be less stiff, get back to normal exercise routine  NEXT MD VISIT:   OBJECTIVE:    CERVICAL ROM:   Active ROM A/PROM (deg) eval 01/08/22  Flexion 45   Extension 35   Right lateral flexion 22 34  Left lateral flexion 18 34  Right rotation 40 43  Left rotation 25 42   (Blank rows = not tested)  UPPER EXTREMITY MMT:  MMT Right eval Left eval  Shoulder flexion 4 4  Shoulder extension    Shoulder abduction 4 4  Shoulder adduction    Shoulder extension    Shoulder internal rotation    Shoulder external rotation    Middle trapezius    Lower trapezius    Elbow flexion    Elbow extension    Wrist flexion    Wrist extension    Wrist ulnar deviation    Wrist radial deviation     Wrist pronation  Wrist supination    Grip strength     (Blank rows = not tested)   TODAY'S TREATMENT:                                                                                                                              [facility] Adult PT Treatment:                                                DATE: 01/08/22 Therapeutic Exercise: UBE L2 x 4 min alt fwd/bkwd SNAGS for cervical rotation 10 x 10 sec bilat Upper trap stretch 10 x 10 sec bilat Cervical retraction x 10 Rows green TB x 20 Shoulder extension x 20 green TB Bilat ER green TB x 20   DATE: 11/29 See HEP   PATIENT EDUCATION:  Education details: PT POC and goals, HEP Person educated: Patient Education method: Explanation, Demonstration, and Handouts Education comprehension: verbalized understanding and returned demonstration  HOME EXERCISE PROGRAM: Access Code: [email or web address] URL: [email or web address] Date: 01/08/2022 Prepared by: Isabelle Course  Exercises - Seated Assisted Cervical Rotation with Towel  - 3 x daily - 7 x weekly - 1 sets - 3 reps - 20-30 seconds hold - Seated Upper Trapezius Stretch  - 3 x daily - 7 x weekly - 1 sets - 3 reps - 20-30 sec hold - Seated Cervical Retraction  - 1 x daily - 7 x weekly - 1 sets - 10 reps - 5 seconds hold - Shoulder External Rotation and Scapular Retraction with Resistance  - 1 x daily - 7 x weekly - 3 sets - 10 reps - Shoulder extension with resistance - Neutral  - 1 x daily - 7 x weekly - 3 sets - 10 reps - Standing Shoulder Row with Anchored Resistance  - 1 x daily - 7 x weekly - 3 sets - 10 reps  ASSESSMENT:  CLINICAL IMPRESSION: Pt has decreased pain, improved ROM and functional mobility. She has met all PT goals and is ready for d/c to HEP  OBJECTIVE IMPAIRMENTS: decreased ROM and increased muscle spasms.    GOALS: Goals reviewed with patient? Yes  SHORT TERM GOALS: Target date: 01/15/2022    Pt will be independent in initial HEP Baseline:   Goal status: MET  LONG TERM GOALS: Target date: 02/12/2022    Pt will be independent in advanced HEP Baseline:  Goal status: MET  2.  Pt will improve cervical lateral flexion and rotation by 10 degrees each direction Baseline:  Goal status: MET  3.  Pt will improve FOTO to >= 73 to demo improved functional mobility Baseline:  Goal status: MET  4.  Pt will sleep x 6 hours without waking due to pain/stiffness Baseline:  Goal status: MET     PLAN:  PT FREQUENCY: 1x/week  PT DURATION: 6  weeks  PLANNED INTERVENTIONS: Therapeutic exercises, Therapeutic activity, Neuromuscular re-education, Balance training, Gait training, Patient/Family education, Self Care, Joint mobilization, Aquatic Therapy, Dry Needling, Electrical stimulation, Cryotherapy, Moist heat, Taping, Ionotophoresis 33m/ml Dexamethasone, Manual therapy, and Re-evaluation  PLAN FOR NEXT SESSION: d/c PHYSICAL THERAPY DISCHARGE SUMMARY  Visits from Start of Care: 2  Current functional level related to goals / functional outcomes: Decreased pain, improved ROM   Remaining deficits: See above   Education / Equipment: HEP   Patient agrees to discharge. Patient goals were met. Patient is being discharged due to meeting the stated rehab goals.   Donta Fuster, PT 01/08/2022, 9:14 AM

## 2022-01-14 ENCOUNTER — Inpatient Hospital Stay: Payer: BC Managed Care – PPO | Attending: Hematology

## 2022-01-14 VITALS — BP 118/82 | HR 73 | Resp 16

## 2022-01-14 DIAGNOSIS — Z9013 Acquired absence of bilateral breasts and nipples: Secondary | ICD-10-CM | POA: Diagnosis not present

## 2022-01-14 DIAGNOSIS — Z79811 Long term (current) use of aromatase inhibitors: Secondary | ICD-10-CM | POA: Diagnosis not present

## 2022-01-14 DIAGNOSIS — C50211 Malignant neoplasm of upper-inner quadrant of right female breast: Secondary | ICD-10-CM | POA: Insufficient documentation

## 2022-01-14 DIAGNOSIS — M85851 Other specified disorders of bone density and structure, right thigh: Secondary | ICD-10-CM | POA: Insufficient documentation

## 2022-01-14 DIAGNOSIS — M8000XD Age-related osteoporosis with current pathological fracture, unspecified site, subsequent encounter for fracture with routine healing: Secondary | ICD-10-CM

## 2022-01-14 DIAGNOSIS — Z170 Estrogen receptor positive status [ER+]: Secondary | ICD-10-CM | POA: Insufficient documentation

## 2022-01-14 MED ORDER — ZOLEDRONIC ACID 4 MG/100ML IV SOLN
4.0000 mg | Freq: Once | INTRAVENOUS | Status: AC
  Administered 2022-01-14: 4 mg via INTRAVENOUS
  Filled 2022-01-14: qty 100

## 2022-01-14 MED ORDER — SODIUM CHLORIDE 0.9 % IV SOLN: Freq: Once | INTRAVENOUS | Status: AC

## 2022-01-14 NOTE — Patient Instructions (Signed)

## 2022-01-15 ENCOUNTER — Ambulatory Visit: Payer: BC Managed Care – PPO

## 2022-01-30 ENCOUNTER — Other Ambulatory Visit: Payer: Self-pay

## 2022-01-30 ENCOUNTER — Telehealth: Payer: Self-pay | Admitting: Gastroenterology

## 2022-01-30 DIAGNOSIS — K641 Second degree hemorrhoids: Secondary | ICD-10-CM

## 2022-01-30 DIAGNOSIS — R194 Change in bowel habit: Secondary | ICD-10-CM

## 2022-01-30 MED ORDER — NA SULFATE-K SULFATE-MG SULF 17.5-3.13-1.6 GM/177ML PO SOLN: 1.0000 | Freq: Once | ORAL | 0 refills | Status: AC

## 2022-01-30 NOTE — Telephone Encounter (Signed)
Inbound call from patient needing prep sent into pharmacy . Patient has a procedure on 1/4. Please advise.

## 2022-02-04 ENCOUNTER — Encounter: Payer: Self-pay | Admitting: Gastroenterology

## 2022-02-06 ENCOUNTER — Encounter: Payer: Self-pay | Admitting: Gastroenterology

## 2022-02-06 ENCOUNTER — Ambulatory Visit (AMBULATORY_SURGERY_CENTER): Payer: BC Managed Care – PPO | Admitting: Gastroenterology

## 2022-02-06 VITALS — BP 112/78 | HR 66 | Resp 11

## 2022-02-06 DIAGNOSIS — Z1211 Encounter for screening for malignant neoplasm of colon: Secondary | ICD-10-CM

## 2022-02-06 MED ORDER — SODIUM CHLORIDE 0.9 % IV SOLN: 500.0000 mL | Freq: Once | INTRAVENOUS | Status: DC

## 2022-02-06 NOTE — Progress Notes (Signed)
Pt's states no medical or surgical changes since previsit or office visit. 

## 2022-02-06 NOTE — Progress Notes (Signed)
Report given to PACU, vss 

## 2022-02-06 NOTE — Patient Instructions (Addendum)
-   Patient has a contact number available for emergencies. The signs and symptoms of potential delayed complications were discussed with the patient. Return to normal activities tomorrow. Written discharge instructions were provided to the patient. - Resume previous diet. - Continue present medications. - Repeat colonoscopy in 10 years for screening purposes.  YOU HAD AN ENDOSCOPIC PROCEDURE TODAY AT [facility]:   Refer to the procedure report that was given to you for any specific questions about what was found during the examination.  If the procedure report does not answer your questions, please call your gastroenterologist to clarify.  If you requested that your care partner not be given the details of your procedure findings, then the procedure report has been included in a sealed envelope for you to review at your convenience later.  YOU SHOULD EXPECT: Some feelings of bloating in the abdomen. Passage of more gas than usual.  Walking can help get rid of the air that was put into your GI tract during the procedure and reduce the bloating. If you had a lower endoscopy (such as a colonoscopy or flexible sigmoidoscopy) you may notice spotting of blood in your stool or on the toilet paper. If you underwent a bowel prep for your procedure, you may not have a normal bowel movement for a few days.  Please Note:  You might notice some irritation and congestion in your nose or some drainage.  This is from the oxygen used during your procedure.  There is no need for concern and it should clear up in a day or so.  SYMPTOMS TO REPORT IMMEDIATELY:  Following lower endoscopy (colonoscopy or flexible sigmoidoscopy):  Excessive amounts of blood in the stool  Significant tenderness or worsening of abdominal pains  Swelling of the abdomen that is new, acute  Fever of 100F or higher  For urgent or emergent issues, a gastroenterologist can be reached at any hour by calling [phone]. Do  not use MyChart messaging for urgent concerns.    DIET:  We do recommend a small meal at first, but then you may proceed to your regular diet.  Drink plenty of fluids but you should avoid alcoholic beverages for 24 hours.  ACTIVITY:  You should plan to take it easy for the rest of today and you should NOT DRIVE or use heavy machinery until tomorrow (because of the sedation medicines used during the test).    FOLLOW UP: Our staff will call the number listed on your records the next business day following your procedure.  We will call around 7:15- 8:00 am to check on you and address any questions or concerns that you may have regarding the information given to you following your procedure. If we do not reach you, we will leave a message.     If any biopsies were taken you will be contacted by phone or by letter within the next 1-3 weeks.  Please call us at [phone] if you have not heard about the biopsies in 3 weeks.    SIGNATURES/CONFIDENTIALITY: You and/or your care partner have signed paperwork which will be entered into your electronic medical record.  These signatures attest to the fact that that the information above on your After Visit Summary has been reviewed and is understood.  Full responsibility of the confidentiality of this discharge information lies with you and/or your care-partner.

## 2022-02-06 NOTE — Op Note (Signed)
[place] Patient Name: Olivia [name] Procedure Date: 02/06/2022 10:47 AM MRN: [id number] Endoscopist: Mallie Mussel L. Loletha Carrow , MD, [id number] Age: 62 Referring MD:  Date of Birth: Oct 11, 1960 Gender: Female Account #: [id number] Procedure:                Colonoscopy Indications:              Screening for colorectal malignant neoplasm                           no polyps last colonoscopy Jan 2014 Medicines:                Monitored Anesthesia Care Procedure:                Pre-Anesthesia Assessment:                           - Prior to the procedure, a History and Physical                            was performed, and patient medications and                            allergies were reviewed. The patient's tolerance of                            previous anesthesia was also reviewed. The risks                            and benefits of the procedure and the sedation                            options and risks were discussed with the patient.                            All questions were answered, and informed consent                            was obtained. Prior Anticoagulants: The patient has                            taken no anticoagulant or antiplatelet agents. ASA                            Grade Assessment: II - A patient with mild systemic                            disease. After reviewing the risks and benefits,                            the patient was deemed in satisfactory condition to                            undergo the procedure.  After obtaining informed consent, the colonoscope                            was passed under direct vision. Throughout the                            procedure, the patient's blood pressure, pulse, and                            oxygen saturations were monitored continuously. The                            Olympus CF-HQ190L [id number]) Colonoscope was                            introduced through the anus and  advanced to the the                            cecum, identified by appendiceal orifice and                            ileocecal valve. The colonoscopy was performed                            without difficulty. The patient tolerated the                            procedure well. The quality of the bowel                            preparation was good. The ileocecal valve,                            appendiceal orifice, and rectum were photographed.                            The bowel preparation used was SUPREP via split                            dose instruction. Scope In: 11:06:50 AM Scope Out: 11:19:44 AM Scope Withdrawal Time: 0 hours 8 minutes 46 seconds  Total Procedure Duration: 0 hours 12 minutes 54 seconds  Findings:                 The perianal and digital rectal examinations were                            normal. (prominent skin fold)                           Repeat examination of right colon under NBI                            performed.  Diverticula were found in the left colon.                           Internal hemorrhoids were found.                           The exam was otherwise without abnormality on                            direct and retroflexion views. Complications:            No immediate complications. Estimated Blood Loss:     Estimated blood loss: none. Impression:               - Diverticulosis in the left colon.                           - Internal hemorrhoids.                           - The examination was otherwise normal on direct                            and retroflexion views.                           - No specimens collected. Recommendation:           - Patient has a contact number available for                            emergencies. The signs and symptoms of potential                            delayed complications were discussed with the                            patient. Return to normal activities  tomorrow.                            Written discharge instructions were provided to the                            patient.                           - Resume previous diet.                           - Continue present medications.                           - Repeat colonoscopy in 10 years for screening                            purposes. Ronte [name] L. Loletha Carrow, MD 02/06/2022 11:26:45 AM This report has been signed electronically.

## 2022-02-06 NOTE — Progress Notes (Signed)
History and Physical:  This patient presents for endoscopic testing for: Encounter Diagnosis  Name Primary?   Special screening for malignant neoplasms, colon Yes    Average risk for CRC - no polyps last screening colonoscopy Jan 2014  Seen in office Feb 2023 for chronic constipation and hemorrhoidal bleeding. She reports constipation improved since then and there is currently no rectal bleeding. Patient is otherwise without complaints or active issues today.   Past Medical History: Past Medical History:  Diagnosis Date   Adjustment insomnia 02/23/2019   Breast cancer ([place]) 2018   Lumpectomy, chemotherapy and radiation with recurrence 2018. Now on Femara   Cervical radiculopathy 07/05/2019   COVID-19 02/2019   Her husband also passed away at this time from COVID-19.   Family history of breast cancer    Genetic testing 10/30/2016   Breast/GYN panel (23 genes) @ [facility] - No pathogenic mutations detected   GERD (gastroesophageal reflux disease)    OTC medication   Grief reaction 02/23/2019   Heart murmur    as a child - Echo in early 20s "ok"   History of echocardiogram 05/2015   Echo 4/17: EF 55-60%, GLS -19.6%, normal wall motion   History of exercise stress test 05/2015   ETT 4/17 - No ST changes   HLD (hyperlipidemia)    Hypothyroidism    Multiple thyroid nodules 2009   Multiple nodules: Normal ultrasound reported at diagnosis   Osteoporosis    Personal history of chemotherapy    Personal history of radiation therapy    UNEQUAL LEG LENGTH, ACQUIRED 11/17/2006   Qualifier: Diagnosis of  By: Oneida Alar MD, KARL       Past Surgical History: Past Surgical History:  Procedure Laterality Date   ABDOMINAL HYSTERECTOMY     BREAST LUMPECTOMY Right 2003   BREAST RECONSTRUCTION WITH PLACEMENT OF TISSUE EXPANDER AND FLEX HD (ACELLULAR HYDRATED DERMIS) Bilateral 11/17/2016   Procedure: IMEDITATE BREAST RECONSTRUCTION WITH PLACEMENT OF TISSUE EXPANDER AND FLEX HD (ACELLULAR  HYDRATED DERMIS);  Surgeon: Wallace Going, DO;  Location: [place];  Service: Plastics;  Laterality: Bilateral;   BREAST SURGERY  2003 /right   lumpectomy; chemotherapy and radiation   LIPOSUCTION WITH LIPOFILLING Bilateral 02/23/2020   Procedure: Fat filling of breasts for symmetry from abdomen;  Surgeon: Wallace Going, DO;  Location: [place];  Service: Plastics;  Laterality: Bilateral;  90 min, please   LIPOSUCTION WITH LIPOFILLING Bilateral 05/16/2021   Procedure: LIPOSUCTION WITH LIPOFILLING;  Surgeon: Wallace Going, DO;  Location: [place];  Service: Plastics;  Laterality: Bilateral;  1.5 hours   REMOVAL OF BILATERAL TISSUE EXPANDERS WITH PLACEMENT OF BILATERAL BREAST IMPLANTS Bilateral 02/11/2017   Procedure: REMOVAL OF BILATERAL TISSUE EXPANDERS WITH PLACEMENT OF BILATERAL SILICONE BREAST IMPLANTS;  Surgeon: Wallace Going, DO;  Location: [place];  Service: Plastics;  Laterality: Bilateral;   TOTAL ABDOMINAL HYSTERECTOMY W/ BILATERAL SALPINGOOPHORECTOMY     for treatment of breast treatment   TOTAL MASTECTOMY Bilateral 11/17/2016   Procedure: RIGHT MASTECTOMY AND LEFT PROPHYLATIC MASTECTOMY;  Surgeon: Autumn Messing III, MD;  [facility];  Service: General;  Laterality: Bilateral;    Allergies: No Known Allergies  Outpatient Meds: Current Outpatient Medications  Medication Sig Dispense Refill   atorvastatin (LIPITOR) 20 MG tablet Take 1 tablet (20 mg total) by mouth daily. 90 tablet 3   Calcium 500 MG CHEW Chew by mouth.     Collagenase POWD by Does not apply route.  diclofenac (VOLTAREN) 75 MG EC tablet Take 1 tablet (75 mg total) by mouth daily. 90 tablet 3   ELDERBERRY PO Take by mouth.     psyllium (METAMUCIL) 58.6 % powder Take 1 packet by mouth daily.     Vitamin D, Cholecalciferol, 25 MCG (1000 UT) CAPS Take 1 capsule by mouth daily.     exemestane (AROMASIN) 25 MG tablet Take 1 tablet (25 mg total)  by mouth daily after breakfast. (Patient not taking: Reported on 01/06/2022) 90 tablet 1   methocarbamol (ROBAXIN) 500 MG tablet Take 1 tablet (500 mg total) by mouth every 8 (eight) hours as needed for muscle spasms. (Patient not taking: Reported on 01/06/2022) 90 tablet 1   naproxen (NAPROSYN) 500 MG tablet Take 1 tablet (500 mg total) by mouth 2 (two) times daily with a meal. 60 tablet 0   Current Facility-Administered Medications  Medication Dose Route Frequency Provider Last Rate Last Admin   0.9 %  sodium chloride infusion  500 mL Intravenous Once Danis, Kirke Corin, MD          ___________________________________________________________________ Objective   Exam:  BP 127/80   Pulse 70   Resp 15   SpO2 98%   CV: regular , S1/S2 Resp: clear to auscultation bilaterally, normal RR and effort noted GI: soft, no tenderness, with active bowel sounds.   Assessment: Encounter Diagnosis  Name Primary?   Special screening for malignant neoplasms, colon Yes     Plan: Colonoscopy  The benefits and risks of the planned procedure were described in detail with the patient or (when appropriate) their health care proxy.  Risks were outlined as including, but not limited to, bleeding, infection, perforation, adverse medication reaction leading to cardiac or pulmonary decompensation, pancreatitis (if ERCP).  The limitation of incomplete mucosal visualization was also discussed.  No guarantees or warranties were given.    The patient is appropriate for an endoscopic procedure in the ambulatory setting.   - Wilfrid Lund, MD

## 2022-02-07 ENCOUNTER — Telehealth: Payer: Self-pay

## 2022-02-07 NOTE — Telephone Encounter (Signed)
  Follow up Call-     02/06/2022    9:45 AM  Call back number  Post procedure Call Back phone  # [phone]  Permission to leave phone message Yes     Patient questions:  Do you have a fever, pain , or abdominal swelling? No. Pain Score  0 *  Have you tolerated food without any problems? Yes.    Have you been able to return to your normal activities? Yes.    Do you have any questions about your discharge instructions: Diet   No. Medications  No. Follow up visit  No.  Do you have questions or concerns about your Care? No.  Actions: * If pain score is 4 or above: No action needed, pain <4.

## 2022-03-18 ENCOUNTER — Ambulatory Visit: Payer: BC Managed Care – PPO | Admitting: Surgical

## 2022-03-18 ENCOUNTER — Encounter: Payer: Self-pay | Admitting: Surgical

## 2022-03-18 VITALS — BP 157/104 | HR 86 | Ht 64.0 in | Wt 144.2 lb

## 2022-03-18 DIAGNOSIS — R220 Localized swelling, mass and lump, head: Secondary | ICD-10-CM

## 2022-03-18 DIAGNOSIS — S0993XD Unspecified injury of face, subsequent encounter: Secondary | ICD-10-CM

## 2022-03-18 DIAGNOSIS — W19XXXD Unspecified fall, subsequent encounter: Secondary | ICD-10-CM

## 2022-03-18 NOTE — Progress Notes (Signed)
   Referring Provider Ma Hillock, DO [id number] [place],  [place] [address]   CC:  Chief Complaint  Patient presents with   Follow-up      Olivia [name] is an 62 y.o. female.  HPI: 62 year old female here for evaluation of her forehead.  She had a fall October 2023.  Presented to the emergency room.  She had a syncopal episode, returned to bed, subsequently woke up the next morning with bruising and bleeding on the left side of her face.  She sustained a C7 anterior-inferior vertebral fracture, right wrist triquetrum fracture and left facial abrasion and bruising.  CT was negative for orbital and facial fractures.  She presents today 4 months post injury with concerns over a area of the central left forehead where she has noticed an indentation and bulking of the muscle inferior to the indentation.  She does not feel as if it has gotten better over the past 4 months.  She wonders if this is swelling.  Review of Systems General: No fevers or chills  Physical Exam    03/18/2022    3:08 PM 02/06/2022   11:42 AM 02/06/2022   11:32 AM  Vitals with BMI  Height '5\' 4"'$     Weight 144 lbs 3 oz    BMI 60.60    Systolic 045 997 94  Diastolic 741 78 64  Pulse 86 66 77    General:  No acute distress,  Alert and oriented, Non-Toxic, Normal speech and affect HEENT: No forehead incision or scarring noted.  She does have an indentation midway up her forehead just to the left of midline.  She has normal function of her frontalis and glabellar region.There is some bulking of the muscle inferior to the forehead indentation.  Assessment/Plan Discussed patient's concerns with her, Dr. Marla Roe also discussed with patient today.  Recommend continuing to monitor the area for the next few months and reevaluate.  May be nerve related, she has good function.  Will plan to continue to follow and evaluate in May.  Pictures were obtained of the patient and placed in the chart with the patient's or  guardian's permission.   Olivia [name] 03/18/2022, 4:04 PM

## 2022-04-07 DIAGNOSIS — Z719 Counseling, unspecified: Secondary | ICD-10-CM

## 2022-05-26 ENCOUNTER — Encounter: Payer: Self-pay | Admitting: Family Medicine

## 2022-05-26 ENCOUNTER — Ambulatory Visit (INDEPENDENT_AMBULATORY_CARE_PROVIDER_SITE_OTHER): Payer: BC Managed Care – PPO | Admitting: Family Medicine

## 2022-05-26 VITALS — BP 135/82 | HR 61 | Temp 98.0°F | Ht 64.37 in | Wt 141.2 lb

## 2022-05-26 DIAGNOSIS — M1990 Unspecified osteoarthritis, unspecified site: Secondary | ICD-10-CM | POA: Diagnosis not present

## 2022-05-26 DIAGNOSIS — E038 Other specified hypothyroidism: Secondary | ICD-10-CM

## 2022-05-26 DIAGNOSIS — Z131 Encounter for screening for diabetes mellitus: Secondary | ICD-10-CM

## 2022-05-26 DIAGNOSIS — E782 Mixed hyperlipidemia: Secondary | ICD-10-CM | POA: Diagnosis not present

## 2022-05-26 DIAGNOSIS — Z0000 Encounter for general adult medical examination without abnormal findings: Secondary | ICD-10-CM | POA: Diagnosis not present

## 2022-05-26 DIAGNOSIS — M810 Age-related osteoporosis without current pathological fracture: Secondary | ICD-10-CM | POA: Diagnosis not present

## 2022-05-26 DIAGNOSIS — E063 Autoimmune thyroiditis: Secondary | ICD-10-CM

## 2022-05-26 LAB — COMPREHENSIVE METABOLIC PANEL
ALT: 25 U/L (ref 0–35)
AST: 23 U/L (ref 0–37)
Albumin: 4.7 g/dL (ref 3.5–5.2)
Alkaline Phosphatase: 70 U/L (ref 39–117)
BUN: 17 mg/dL (ref 6–23)
CO2: 28 mEq/L (ref 19–32)
Calcium: 9.3 mg/dL (ref 8.4–10.5)
Chloride: 104 mEq/L (ref 96–112)
Creatinine, Ser: 0.94 mg/dL (ref 0.40–1.20)
GFR: 65.77 mL/min (ref >60.00)
Glucose, Bld: 93 mg/dL (ref 70–99)
Potassium: 4.1 mEq/L (ref 3.5–5.1)
Sodium: 141 mEq/L (ref 135–145)
Total Bilirubin: 0.8 mg/dL (ref 0.2–1.2)
Total Protein: 6.7 g/dL (ref 6.0–8.3)

## 2022-05-26 LAB — CBC WITH DIFFERENTIAL/PLATELET
Basophils Absolute: 0 10*3/uL (ref 0.0–0.1)
Basophils Relative: 0.7 % (ref 0.0–3.0)
Eosinophils Absolute: 0.1 10*3/uL (ref 0.0–0.7)
Eosinophils Relative: 1.3 % (ref 0.0–5.0)
HCT: 43.7 % (ref 36.0–46.0)
Hemoglobin: 14.4 g/dL (ref 12.0–15.0)
Lymphocytes Relative: 31.9 % (ref 12.0–46.0)
Lymphs Abs: 1.3 10*3/uL (ref 0.7–4.0)
MCHC: 32.9 g/dL (ref 30.0–36.0)
MCV: 88.7 fl (ref 78.0–100.0)
Monocytes Absolute: 0.3 10*3/uL (ref 0.1–1.0)
Monocytes Relative: 7.5 % (ref 3.0–12.0)
Neutro Abs: 2.4 10*3/uL (ref 1.4–7.7)
Neutrophils Relative %: 58.6 % (ref 43.0–77.0)
Platelets: 258 10*3/uL (ref 150.0–400.0)
RBC: 4.92 Mil/uL (ref 3.87–5.11)
RDW: 14.7 % (ref 11.5–15.5)
WBC: 4 10*3/uL (ref 4.0–10.5)

## 2022-05-26 LAB — LIPID PANEL
Cholesterol: 144 mg/dL (ref 0–200)
HDL: 56.3 mg/dL (ref >39.00)
LDL Cholesterol: 70 mg/dL (ref 0–99)
NonHDL: 87.29
Total CHOL/HDL Ratio: 3
Triglycerides: 87 mg/dL (ref 0.0–149.0)
VLDL: 17.4 mg/dL (ref 0.0–40.0)

## 2022-05-26 LAB — HEMOGLOBIN A1C: Hgb A1c MFr Bld: 5.9 % (ref 4.6–6.5)

## 2022-05-26 LAB — TSH: TSH: 1.97 u[IU]/mL (ref 0.35–5.50)

## 2022-05-26 LAB — VITAMIN D 25 HYDROXY (VIT D DEFICIENCY, FRACTURES): VITD: 42.47 ng/mL (ref 30.00–100.00)

## 2022-05-26 MED ORDER — DICLOFENAC SODIUM 75 MG PO TBEC: 75.0000 mg | DELAYED_RELEASE_TABLET | Freq: Every day | ORAL | 3 refills | Status: DC

## 2022-05-26 MED ORDER — ATORVASTATIN CALCIUM 20 MG PO TABS: 20.0000 mg | ORAL_TABLET | Freq: Every day | ORAL | 3 refills | Status: DC

## 2022-05-26 NOTE — Progress Notes (Signed)
Patient ID: Olivia [name], female  DOB: 07-29-1960, 62 y.o.   MRN: [id number] Patient Care Team    Relationship Specialty Notifications Start End  Natalia Leatherwood, DO PCP - General Family Medicine  12/26/14   Malachy Mood, MD Consulting Physician Hematology  10/15/16   Griselda Miner, MD Consulting Physician General Surgery  10/15/16   Antony Blackbird, MD Consulting Physician Radiation Oncology  10/15/16   Mardella Layman, MD Consulting Physician Gastroenterology  04/14/17   Richarda Overlie, MD Consulting Physician Obstetrics and Gynecology  04/14/17   Loa Socks, NP Nurse Practitioner Hematology and Oncology  07/08/17   Reather Littler, MD Consulting Physician Endocrinology  04/22/18    Comment: thyroid nodules    Chief Complaint  Patient presents with   Annual Exam    [facility]; pt is fasting    Subjective:  Olivia [name] is a 62 y.o.  Female  present for CPE and Chronic Conditions/illness Management  All past medical history, surgical history, allergies, family history, immunizations, medications and social history were updated in the electronic medical record today. All recent labs, ED visits and hospitalizations within the last year were reviewed.  Health maintenance:  Colonoscopy: completed by Dr. Myrtie Neither January UTD 02/2022. Ten-year follow-up. Mammogram:  Double mastectomy 09/30/2016, patient being treated for recurrent breast cancer. N/i Cervical cancer screening: last pap: History of hysterectomy, pelvic exams completed by Dr. Marcelle Overlie. Immunizations: tdap UTD 05/2021, Influenza UTD 2023(encouraged yearly). Shingrix series completed Infectious disease screening: HIV and  Hep C completed DEXA: History of osteoporosis, on prolia injections every 6 months. Last DEXA scan 11/2021([facility]- ordered by speciliast team). Follow-up every 2 years Patient has a Dental home. Hospitalizations/ED visits: reviewed   Arthritis:  She reports diclofenac1-2 x daily and it  is working well   for her. Mobic had not been working well      05/26/2022    8:58 AM 11/27/2021   11:16 AM 05/23/2021    9:02 AM 02/28/2021   11:28 AM 05/14/2020    8:02 AM  Depression screen PHQ 2/9  Decreased Interest 0 0 0 0 0  Down, Depressed, Hopeless 0 0 0 0 0  PHQ - 2 Score 0 0 0 0 0      04/22/2018    9:14 AM  GAD 7 : Generalized Anxiety Score  Nervous, Anxious, on Edge 1  Control/stop worrying 1  Worry too much - different things 1  Trouble relaxing 0  Restless 0  Easily annoyed or irritable 1  Afraid - awful might happen 0  Total GAD 7 Score 4  Anxiety Difficulty Not difficult at all    Immunization History  Administered Date(s) Administered   Influenza Split 11/26/2011   Influenza,inj,Quad PF,6+ Mos 11/20/2013, 10/20/2018, 11/04/2019, 11/26/2020, 11/27/2021   Influenza,inj,quad, With Preservative 11/18/2018   Influenza-Unspecified 12/05/2014, 10/03/2015, 11/10/2016, 10/04/2017   PFIZER Comirnaty(Gray Top)Covid-19 Tri-Sucrose Vaccine 07/20/2020   PFIZER(Purple Top)SARS-COV-2 Vaccination 05/12/2019, 06/06/2019, 12/07/2019   Tdap 01/29/2012, 05/23/2021   Unspecified SARS-COV-2 Vaccination 05/12/2019, 06/06/2019, 12/07/2019, 07/20/2020   Zoster Recombinat (Shingrix) 06/03/2017, 08/18/2017     Past Medical History:  Diagnosis Date   Adjustment insomnia 02/23/2019   Breast cancer 2018   Lumpectomy, chemotherapy and radiation with recurrence 2018. Now on Femara   Cervical radiculopathy 07/05/2019   COVID-19 02/2019   Her husband also passed away at this time from COVID-19.   Family history of breast cancer    Genetic testing 10/30/2016   Breast/GYN panel (23 genes) @  [facility] - No pathogenic mutations detected   GERD (gastroesophageal reflux disease)    OTC medication   Grief reaction 02/23/2019   Heart murmur    as a child - Echo in early 20s "ok"   History of echocardiogram 05/2015   Echo 4/17: EF 55-60%, GLS -19.6%, normal wall motion   History of exercise stress test  05/2015   ETT 4/17 - No ST changes   HLD (hyperlipidemia)    Hypothyroidism    Multiple thyroid nodules 2009   Multiple nodules: Normal ultrasound reported at diagnosis   Osteoporosis    Personal history of chemotherapy    Personal history of radiation therapy    UNEQUAL LEG LENGTH, ACQUIRED 11/17/2006   Qualifier: Diagnosis of  By: Darrick Penna MD, KARL     No Known Allergies Past Surgical History:  Procedure Laterality Date   ABDOMINAL HYSTERECTOMY     BREAST LUMPECTOMY Right 2003   BREAST RECONSTRUCTION WITH PLACEMENT OF TISSUE EXPANDER AND FLEX HD (ACELLULAR HYDRATED DERMIS) Bilateral 11/17/2016   Procedure: IMEDITATE BREAST RECONSTRUCTION WITH PLACEMENT OF TISSUE EXPANDER AND FLEX HD (ACELLULAR HYDRATED DERMIS);  Surgeon: Peggye Form, DO;  Location: [facility];  Service: Plastics;  Laterality: Bilateral;   BREAST SURGERY  2003 /right   lumpectomy; chemotherapy and radiation   LIPOSUCTION WITH LIPOFILLING Bilateral 02/23/2020   Procedure: Fat filling of breasts for symmetry from abdomen;  Surgeon: Peggye Form, DO;  Location: [facility];  Service: Plastics;  Laterality: Bilateral;  90 min, please   LIPOSUCTION WITH LIPOFILLING Bilateral 05/16/2021   Procedure: LIPOSUCTION WITH LIPOFILLING;  Surgeon: Peggye Form, DO;  Location: [facility];  Service: Plastics;  Laterality: Bilateral;  1.5 hours   REMOVAL OF BILATERAL TISSUE EXPANDERS WITH PLACEMENT OF BILATERAL BREAST IMPLANTS Bilateral 02/11/2017   Procedure: REMOVAL OF BILATERAL TISSUE EXPANDERS WITH PLACEMENT OF BILATERAL SILICONE BREAST IMPLANTS;  Surgeon: Peggye Form, DO;  Location: [facility];  Service: Plastics;  Laterality: Bilateral;   TOTAL ABDOMINAL HYSTERECTOMY W/ BILATERAL SALPINGOOPHORECTOMY     for treatment of breast treatment   TOTAL MASTECTOMY Bilateral 11/17/2016   Procedure: RIGHT MASTECTOMY AND LEFT PROPHYLATIC MASTECTOMY;  Surgeon: Griselda Miner,  MD;  Location: [facility];  Service: General;  Laterality: Bilateral;   Family History  Problem Relation Age of Onset   Coronary artery disease Mother        1ST DEGREE RELATIVE   Heart attack Mother 75   Skin cancer Mother    Diabetes Father    Kidney disease Father    Prostate cancer Father 43       deceased 32   Cancer Brother 65       multiple myeloma    Lung cancer Maternal Uncle        in 2 mat uncles   Breast cancer Paternal Aunt 81   Breast cancer Cousin        dx 58s; daughter of unaffected paternal aunt   Breast cancer Cousin        dx 29s; another daughter of unaffected paternal aunt   Esophageal cancer Cousin        pat first cousin   Diabetes Other        1ST DEGREE RELATIVE   Colon cancer Neg Hx    Colon polyps Neg Hx    Stomach cancer Neg Hx    Rectal cancer Neg Hx    Social History   Social History Narrative  Married. Bobby.   Children: none. No pets.    Stay at home.    Wears her seatbelt. Smoke alarm in the home.    Feels safe in her relationships.    Exercise 5x week.     Allergies as of 05/26/2022   No Known Allergies      Medication List        Accurate as of May 26, 2022  9:11 AM. If you have any questions, ask your nurse or doctor.          STOP taking these medications    exemestane 25 MG tablet Commonly known as: AROMASIN Stopped by: Felix Pacini, DO   methocarbamol 500 MG tablet Commonly known as: ROBAXIN Stopped by: Felix Pacini, DO   naproxen 500 MG tablet Commonly known as: Naprosyn Stopped by: Felix Pacini, DO       TAKE these medications    atorvastatin 20 MG tablet Commonly known as: LIPITOR Take 1 tablet (20 mg total) by mouth daily.   Calcium 500 MG Chew Chew by mouth.   Collagenase Powd by Does not apply route.   diclofenac 75 MG EC tablet Commonly known as: VOLTAREN Take 1 tablet (75 mg total) by mouth daily.   ELDERBERRY PO Take by mouth.   psyllium 58.6 % powder Commonly known as:  METAMUCIL Take 1 packet by mouth daily.   Vitamin D (Cholecalciferol) 25 MCG (1000 UT) Caps Take 1 capsule by mouth daily.        All past medical history, surgical history, allergies, family history, immunizations andmedications were updated in the EMR today and reviewed under the history and medication portions of their EMR.     No results found for this or any previous visit (from the past 2160 hour(s)).   No results found.   ROS 14 pt review of systems performed and negative (unless mentioned in an HPI)  Objective: BP 135/82   Pulse 61   Temp 98 F (36.7 C)   Ht 5' 4.37" (1.635 m)   Wt 141 lb 3.2 oz (64 kg)   SpO2 98%   BMI 23.96 kg/m  Physical Exam Vitals and nursing note reviewed.  Constitutional:      General: She is not in acute distress.    Appearance: Normal appearance. She is not ill-appearing or toxic-appearing.  HENT:     Head: Normocephalic and atraumatic.     Right Ear: Tympanic membrane, ear canal and external ear normal. There is no impacted cerumen.     Left Ear: Tympanic membrane, ear canal and external ear normal. There is no impacted cerumen.     Nose: No congestion or rhinorrhea.     Mouth/Throat:     Mouth: Mucous membranes are moist.     Pharynx: Oropharynx is clear. No oropharyngeal exudate or posterior oropharyngeal erythema.  Eyes:     General: No scleral icterus.       Right eye: No discharge.        Left eye: No discharge.     Extraocular Movements: Extraocular movements intact.     Conjunctiva/sclera: Conjunctivae normal.     Pupils: Pupils are equal, round, and reactive to light.  Cardiovascular:     Rate and Rhythm: Normal rate and regular rhythm.     Pulses: Normal pulses.     Heart sounds: Normal heart sounds. No murmur heard.    No friction rub. No gallop.  Pulmonary:     Effort: Pulmonary effort is normal. No respiratory distress.  Breath sounds: Normal breath sounds. No stridor. No wheezing, rhonchi or rales.  Chest:      Chest wall: No tenderness.  Abdominal:     General: Abdomen is flat. Bowel sounds are normal. There is no distension.     Palpations: Abdomen is soft. There is no mass.     Tenderness: There is no abdominal tenderness. There is no right CVA tenderness, left CVA tenderness, guarding or rebound.     Hernia: No hernia is present.  Musculoskeletal:        General: No swelling, tenderness or deformity. Normal range of motion.     Cervical back: Normal range of motion and neck supple. No rigidity or tenderness.     Right lower leg: No edema.     Left lower leg: No edema.  Lymphadenopathy:     Cervical: No cervical adenopathy.  Skin:    General: Skin is warm and dry.     Coloration: Skin is not jaundiced or pale.     Findings: No bruising, erythema, lesion or rash.  Neurological:     General: No focal deficit present.     Mental Status: She is alert and oriented to person, place, and time. Mental status is at baseline.     Cranial Nerves: No cranial nerve deficit.     Sensory: No sensory deficit.     Motor: No weakness.     Coordination: Coordination normal.     Gait: Gait normal.     Deep Tendon Reflexes: Reflexes normal.  Psychiatric:        Mood and Affect: Mood normal.        Behavior: Behavior normal.        Thought Content: Thought content normal.        Judgment: Judgment normal.      Assessment/plan: ABIGAILE ROSSIE is a 62 y.o. female present for CPE and Chronic Conditions/illness Management Hypothyroidism due to acquired atrophy of thyroid Currently does not require meds. osteoporosis, unspecified pathological fracture presence Receiving prolia q 6 - DEXA UTD 2023 - vit d collected Malignant neoplasm of upper-inner quadrant of right breast in female, estrogen receptor positive (HCC) Follows closely with onc team Arthritis: Continue diclofenac qd Mixed hyperlipidemia/on statin therpay Stable Continue  lipitor - lipid panel collected today Diabetes mellitus  screening - Hemoglobin A1c  Encounter for preventive health examination Patient was encouraged to exercise greater than 150 minutes a week. Patient was encouraged to choose a diet filled with fresh fruits and vegetables, and lean meats. AVS provided to patient today for education/recommendation on gender specific health and safety maintenance. Colonoscopy: completed by Dr. Myrtie Neither January UTD 02/2022. Ten-year follow-up. Mammogram:  Double mastectomy 09/30/2016, patient being treated for recurrent breast cancer. N/i Cervical cancer screening: last pap: History of hysterectomy, pelvic exams completed by Dr. Marcelle Overlie. Immunizations: tdap UTD 05/2021, Influenza UTD 2023(encouraged yearly). Shingrix series completed Infectious disease screening: HIV and  Hep C completed DEXA: History of osteoporosis, on prolia injections every 6 months. Last DEXA scan 11/2021([facility]- ordered by speciliast team). Follow-up every 2 years - CBC with Differential/Platelet - Comprehensive metabolic panel - Hemoglobin A1c  Return in about 1 year (around 05/27/2023) for cpe (20 min), Routine chronic condition follow-up.   Orders Placed This Encounter  Procedures   CBC with Differential/Platelet   Comprehensive metabolic panel   Hemoglobin A1c   Lipid panel   TSH   Vitamin D (25 hydroxy)   Meds ordered this encounter  Medications   atorvastatin (LIPITOR)  20 MG tablet    Sig: Take 1 tablet (20 mg total) by mouth daily.    Dispense:  90 tablet    Refill:  3   diclofenac (VOLTAREN) 75 MG EC tablet    Sig: Take 1 tablet (75 mg total) by mouth daily.    Dispense:  90 tablet    Refill:  3   Referral Orders  No referral(s) requested today      Electronically signed by: Felix Pacini, DO [facility] [place]

## 2022-05-26 NOTE — Patient Instructions (Signed)
No follow-ups on file.        Great to see you today.  I have refilled the medication(s) we provide.   If labs were collected, we will inform you of lab results once received either by echart message or telephone call.   - echart message- for normal results that have been seen by the patient already.   - telephone call: abnormal results or if patient has not viewed results in their echart.  

## 2022-06-10 ENCOUNTER — Ambulatory Visit: Payer: BC Managed Care – PPO | Admitting: Plastic Surgery

## 2022-06-10 ENCOUNTER — Encounter: Payer: Self-pay | Admitting: Plastic Surgery

## 2022-06-10 DIAGNOSIS — Z170 Estrogen receptor positive status [ER+]: Secondary | ICD-10-CM | POA: Diagnosis not present

## 2022-06-10 DIAGNOSIS — C50211 Malignant neoplasm of upper-inner quadrant of right female breast: Secondary | ICD-10-CM | POA: Diagnosis not present

## 2022-06-10 DIAGNOSIS — Z9013 Acquired absence of bilateral breasts and nipples: Secondary | ICD-10-CM

## 2022-06-10 NOTE — Progress Notes (Signed)
   Subjective:    Patient ID: Olivia [name], female    DOB: 1960-07-23, 62 y.o.   MRN: [id number]  The patient is a 62 year old female here for a follow-up on her breast reconstruction.  In 2002, she had invasive ductal carcinoma of the right breast in the upper inner quadrant.  It was estrogen and progesterone positive and HER2 negative.  She had a right partial mastectomy with radiation.  In October 2018 she then decided on bilateral mastectomies.  The expanders were removed and implants were placed in January 2019.  She had Mentor smooth round high-profile gel 350 cc implants placed.  In January 2022 she had some fat grafting done to both breasts.  In April 2023 she had more fat grafting done to both breasts.  She comes in for a checkup and has a 3 mm lesion just under the skin on the right upper breast.  This feels like a cyst.  But with her history on that side I think further evaluation would be helpful.  She did get nipple areola tattooing and is very pleased with the results.      Review of Systems  Constitutional: Negative.   HENT: Negative.    Eyes: Negative.   Respiratory: Negative.    Cardiovascular: Negative.   Gastrointestinal: Negative.   Endocrine: Negative.   Genitourinary: Negative.   Musculoskeletal: Negative.        Objective:   Physical Exam Vitals and nursing note reviewed.  Constitutional:      Appearance: Normal appearance.  HENT:     Head: Normocephalic and atraumatic.  Cardiovascular:     Rate and Rhythm: Normal rate.     Pulses: Normal pulses.  Pulmonary:     Effort: Pulmonary effort is normal.  Abdominal:     Palpations: Abdomen is soft.  Musculoskeletal:        General: No swelling.  Skin:    General: Skin is warm.     Coloration: Skin is not jaundiced.     Findings: No bruising.  Neurological:     Mental Status: She is alert and oriented to person, place, and time.  Psychiatric:        Mood and Affect: Mood normal.        Behavior: Behavior  normal.        Thought Content: Thought content normal.        Judgment: Judgment normal.        Assessment & Plan:     ICD-10-CM   1. Acquired absence of bilateral breasts and nipples  Z90.13     2. Malignant neoplasm of upper-inner quadrant of right breast in female, estrogen receptor positive (HCC)  C50.211    Z17.0        We will go ahead and order an ultrasound bilateral for implant integrity as well as a lesion of the right breast.  I will call her once we get the results.   Pictures were obtained of the patient and placed in the chart with the patient's or guardian's permission.

## 2022-06-17 ENCOUNTER — Other Ambulatory Visit: Payer: Self-pay

## 2022-06-19 ENCOUNTER — Ambulatory Visit
Admission: RE | Admit: 2022-06-19 | Discharge: 2022-06-19 | Disposition: A | Discharge status: Home / Self Care | Payer: BC Managed Care – PPO | Source: Ambulatory Visit | Attending: Plastic Surgery | Admitting: Plastic Surgery

## 2022-06-19 ENCOUNTER — Other Ambulatory Visit: Payer: Self-pay | Admitting: Plastic Surgery

## 2022-06-19 DIAGNOSIS — C50211 Malignant neoplasm of upper-inner quadrant of right female breast: Secondary | ICD-10-CM

## 2022-06-19 DIAGNOSIS — Z9013 Acquired absence of bilateral breasts and nipples: Secondary | ICD-10-CM | POA: Diagnosis not present

## 2022-06-19 DIAGNOSIS — C50811 Malignant neoplasm of overlapping sites of right female breast: Secondary | ICD-10-CM | POA: Diagnosis not present

## 2022-06-19 DIAGNOSIS — Z170 Estrogen receptor positive status [ER+]: Secondary | ICD-10-CM

## 2022-06-19 DIAGNOSIS — N6310 Unspecified lump in the right breast, unspecified quadrant: Secondary | ICD-10-CM

## 2022-06-19 DIAGNOSIS — N63 Unspecified lump in unspecified breast: Secondary | ICD-10-CM | POA: Diagnosis not present

## 2022-07-01 ENCOUNTER — Ambulatory Visit
Admission: RE | Admit: 2022-07-01 | Discharge: 2022-07-01 | Disposition: A | Discharge status: Home / Self Care | Payer: BC Managed Care – PPO | Source: Ambulatory Visit | Attending: Plastic Surgery | Admitting: Plastic Surgery

## 2022-07-01 DIAGNOSIS — N6310 Unspecified lump in the right breast, unspecified quadrant: Secondary | ICD-10-CM

## 2022-07-01 DIAGNOSIS — Z9013 Acquired absence of bilateral breasts and nipples: Secondary | ICD-10-CM

## 2022-07-01 DIAGNOSIS — Z170 Estrogen receptor positive status [ER+]: Secondary | ICD-10-CM

## 2022-07-01 DIAGNOSIS — N63 Unspecified lump in unspecified breast: Secondary | ICD-10-CM | POA: Diagnosis not present

## 2022-07-01 DIAGNOSIS — D0511 Intraductal carcinoma in situ of right breast: Secondary | ICD-10-CM | POA: Diagnosis not present

## 2022-07-01 DIAGNOSIS — C50211 Malignant neoplasm of upper-inner quadrant of right female breast: Secondary | ICD-10-CM | POA: Diagnosis not present

## 2022-07-01 HISTORY — PX: BREAST BIOPSY: SHX20

## 2022-07-03 ENCOUNTER — Telehealth: Payer: Self-pay | Admitting: Hematology

## 2022-07-03 NOTE — Telephone Encounter (Signed)
Contacted patient to scheduled appointments. Patient is aware of appointments that are scheduled.   

## 2022-07-09 ENCOUNTER — Ambulatory Visit: Payer: Self-pay | Admitting: General Surgery

## 2022-07-09 DIAGNOSIS — Z170 Estrogen receptor positive status [ER+]: Secondary | ICD-10-CM | POA: Diagnosis not present

## 2022-07-09 DIAGNOSIS — C50211 Malignant neoplasm of upper-inner quadrant of right female breast: Secondary | ICD-10-CM | POA: Diagnosis not present

## 2022-07-09 MED ORDER — KETOROLAC TROMETHAMINE 15 MG/ML IJ SOLN
15.0000 mg | Freq: Once | INTRAMUSCULAR | Status: AC
Start: 2022-07-09 — End: 2022-07-14

## 2022-07-10 ENCOUNTER — Inpatient Hospital Stay: Payer: BC Managed Care – PPO

## 2022-07-10 ENCOUNTER — Encounter: Payer: Self-pay | Admitting: Nurse Practitioner

## 2022-07-10 ENCOUNTER — Inpatient Hospital Stay: Payer: BC Managed Care – PPO | Attending: Nurse Practitioner | Admitting: Nurse Practitioner

## 2022-07-10 VITALS — BP 132/79 | HR 70 | Resp 16

## 2022-07-10 VITALS — BP 129/73 | HR 78 | Temp 97.8°F | Resp 16 | Wt 139.3 lb

## 2022-07-10 DIAGNOSIS — M8000XD Age-related osteoporosis with current pathological fracture, unspecified site, subsequent encounter for fracture with routine healing: Secondary | ICD-10-CM

## 2022-07-10 DIAGNOSIS — Z170 Estrogen receptor positive status [ER+]: Secondary | ICD-10-CM | POA: Insufficient documentation

## 2022-07-10 DIAGNOSIS — C50211 Malignant neoplasm of upper-inner quadrant of right female breast: Secondary | ICD-10-CM | POA: Diagnosis not present

## 2022-07-10 DIAGNOSIS — Z9013 Acquired absence of bilateral breasts and nipples: Secondary | ICD-10-CM | POA: Insufficient documentation

## 2022-07-10 DIAGNOSIS — Z79899 Other long term (current) drug therapy: Secondary | ICD-10-CM | POA: Diagnosis not present

## 2022-07-10 LAB — CBC WITH DIFFERENTIAL (CANCER CENTER ONLY)
Abs Immature Granulocytes: 0.01 10*3/uL (ref 0.00–0.07)
Basophils Absolute: 0 10*3/uL (ref 0.0–0.1)
Basophils Relative: 1 %
Eosinophils Absolute: 0.1 10*3/uL (ref 0.0–0.5)
Eosinophils Relative: 1 %
HCT: 44.8 % (ref 36.0–46.0)
Hemoglobin: 15 g/dL (ref 12.0–15.0)
Immature Granulocytes: 0 %
Lymphocytes Relative: 22 %
Lymphs Abs: 1.5 10*3/uL (ref 0.7–4.0)
MCH: 29.8 pg (ref 26.0–34.0)
MCHC: 33.5 g/dL (ref 30.0–36.0)
MCV: 89.1 fL (ref 80.0–100.0)
Monocytes Absolute: 0.4 10*3/uL (ref 0.1–1.0)
Monocytes Relative: 6 %
Neutro Abs: 4.6 10*3/uL (ref 1.7–7.7)
Neutrophils Relative %: 70 %
Platelet Count: 279 10*3/uL (ref 150–400)
RBC: 5.03 MIL/uL (ref 3.87–5.11)
RDW: 14.1 % (ref 11.5–15.5)
WBC Count: 6.6 10*3/uL (ref 4.0–10.5)
nRBC: 0 % (ref 0.0–0.2)

## 2022-07-10 LAB — CMP (CANCER CENTER ONLY)
ALT: 36 U/L (ref 0–44)
AST: 36 U/L (ref 15–41)
Albumin: 5 g/dL (ref 3.5–5.0)
Alkaline Phosphatase: 77 U/L (ref 38–126)
Anion gap: 8 (ref 5–15)
BUN: 26 mg/dL — ABNORMAL HIGH (ref 8–23)
CO2: 30 mmol/L (ref 22–32)
Calcium: 10.2 mg/dL (ref 8.9–10.3)
Chloride: 103 mmol/L (ref 98–111)
Creatinine: 1.16 mg/dL — ABNORMAL HIGH (ref 0.44–1.00)
GFR, Estimated: 54 mL/min — ABNORMAL LOW (ref >60)
Glucose, Bld: 97 mg/dL (ref 70–99)
Potassium: 3.9 mmol/L (ref 3.5–5.1)
Sodium: 141 mmol/L (ref 135–145)
Total Bilirubin: 0.7 mg/dL (ref 0.3–1.2)
Total Protein: 7.5 g/dL (ref 6.5–8.1)

## 2022-07-10 MED ORDER — SODIUM CHLORIDE 0.9 % IV SOLN: Freq: Once | INTRAVENOUS | Status: AC

## 2022-07-10 MED ORDER — ZOLEDRONIC ACID 4 MG/100ML IV SOLN
4.0000 mg | Freq: Once | INTRAVENOUS | Status: AC
  Administered 2022-07-10: 4 mg via INTRAVENOUS
  Filled 2022-07-10: qty 100

## 2022-07-10 NOTE — Progress Notes (Addendum)
Patient Care Team: Natalia Leatherwood, DO as PCP - General (Family Medicine) Malachy Mood, MD as Consulting Physician (Hematology) Griselda Miner, MD as Consulting Physician (General Surgery) Antony Blackbird, MD as Consulting Physician (Radiation Oncology) Mardella Layman, MD as Consulting Physician (Gastroenterology) Richarda Overlie, MD as Consulting Physician (Obstetrics and Gynecology) Loa Socks, NP as Nurse Practitioner (Hematology and Oncology) Reather Littler, MD as Consulting Physician (Endocrinology)   CHIEF COMPLAINT: Follow up recurrent right breast cancer   Oncology History Overview Note  Cancer Staging Malignant neoplasm of upper-inner quadrant of right breast in female, estrogen receptor positive (HCC) Staging form: Breast, AJCC 8th Edition - Clinical stage from 10/09/2016: Stage IA (cT1c, cN0, cM0, G3, ER: Positive, PR: Positive, HER2: Negative) - Signed by Malachy Mood, MD on 10/14/2016 - Pathologic stage from 11/17/2016: Stage IB (pT2, pN0, cM0, G3, ER: Positive, PR: Positive, HER2: Negative, Oncotype DX score: 21) - Signed by Malachy Mood, MD on 12/11/2016     Malignant neoplasm of upper-inner quadrant of right breast in female, estrogen receptor positive (HCC)  10/08/2016 Mammogram   Diagnostic mammogram and ultrasound showed an irregular mass in the right breast at 1 o'clock, 5 cm from the nipple measuring 1.6 x 1.4x 1.6 cm, axilla was negative for adenopathy.   10/09/2016 Receptors her2   Estrogen Receptor: 80%, POSITIVE, STRONG STAINING INTENSITY Progesterone Receptor: 30%, POSITIVE, STRONG STAINING INTENSITY Proliferation Marker Ki67: 35% HER2 NEGATIVE    10/09/2016 Initial Diagnosis   Malignant neoplasm of upper-inner quadrant of right breast in female, estrogen receptor positive (HCC)   10/09/2016 Initial Biopsy   Diagnosis Breast, right, needle core biopsy, upper inner quadrant, 1:00 o'clock - INVASIVE DUCTAL CARCINOMA, G3   10/2016 Genetic Testing    Genetic testing was negative for The genes analyzed were the 23 genes on [facility]'s Breast/GYN panel (ATM, BARD1, BRCA1, BRCA2, BRIP1, CDH1, CHEK2, DICER1, EPCAM, MLH1,  MSH2, MSH6, NBN, NF1, PALB2, PMS2, PTEN, RAD50, RAD51C, RAD51D,SMARCA4, STK11, and TP53).     11/17/2016 Surgery   RIGHT MASTECTOMY AND LEFT PROPHYLATIC MASTECTOMY by Dr. Carolynne Edouard  IMEDITATE BREAST RECONSTRUCTION WITH PLACEMENT OF TISSUE EXPANDER AND FLEX HD (ACELLULAR HYDRATED DERMIS) by Dr. Ulice Bold     11/17/2016 Pathology Results   Diagnosis 11/17/16 1. Breast, simple mastectomy, Left - BENIGN BREAST TISSUE WITH ECTATIC DUCTS. - ONE OF ONE LYMPH NODE NEGATIVE FOR CARCINOMA (0/1). 2. Breast, simple mastectomy, Right - INVASIVE DUCTAL CARCINOMA, GRADE 3, SPANNING 2.4 CM. - HIGH GRADE DUCTAL CARCINOMA IN SITU. - INVASIVE CARCINOMA IS <0.1 CM OF THE POSTERIOR MARGIN (BROADLY) AND ANTERIOR MARGIN (FOCALLY). - IN SITU CARCINOMA IS >0.2 CM OF BOTH MARGINS. - PRIOR LUMPECTOMY SITE, NEGATIVE FOR CARCINOMA. - SEE ONCOLOGY TABLE. 3. Breast, excision, Superior flap deep margin - BENIGN FIBROADIPOSE TISSUE.   11/17/2016 Miscellaneous   Oncotype on 11/17/16 Rcurence Risk Score of 21 10-Year risk of distant reucrrence with Tamoxifen alone is 13%    12/18/2016 - 12/30/2021 Anti-estrogen oral therapy   Adjuvant Letrozole 2.5 mg daily; began 12/18/16. Due to arthritis I switched her to Exemestane 25mg  daily in 11/2019.    12/24/2016 Imaging   BONE SCAN IMPRESSION: No evidence of osseous metastatic disease.   12/24/2016 Imaging   CT CAP IMPRESSION: 1. Status post bilateral modified radical mastectomy and breast reconstruction, as above. No findings to suggest metastatic disease in the chest, abdomen or pelvis. 2. Multiple heterogeneous appearing thyroid nodules, as discussed above. These are nonspecific but further evaluation with nonemergent thyroid ultrasound is recommended  in the near future to determine any potential  need for further evaluation with fine-needle aspiration. 3. Incidental findings, as above.   02/11/2017 Surgery   REMOVAL OF BILATERAL TISSUE EXPANDERS WITH PLACEMENT OF BILATERAL SILICONE BREAST IMPLANTS by Dr. Ulice Bold    06/19/2022 Imaging   R AX [place]:  Targeted ultrasound is performed, showing a 5 x 2 x 2 mm irregular hypoechoic mass right breast 1 o'clock position 3 cm from nipple at the site of palpable concern. No right axillary adenopathy.   IMPRESSION: Indeterminate palpable mass right breast 1 o'clock position.   07/01/2022 Pathology Results   Recurrence/new   Breast, right, needle core biopsy, 1 o'clock, 3cmfn, hydromark clip INVASIVE DUCTAL CARCINOMA, RECURRENT OVERALL GRADE: 2  PROGNOSTIC INDICATORS The tumor cells are EQUIVOCAL for Her2 (2+).  Estrogen Receptor: 100%, POSITIVE, STRONG STAINING INTENSITY Progesterone Receptor: 100%, POSITIVE, STRONG STAINING INTENSITY Proliferation Marker Ki67: 20%  1A. Breast, righnt,needle core biopsy, 1 o'clock, 3 cmfn FLUORESCENCE IN-SITU HYBRIDIZATION Results: GROUP 5: HER2 **NEGATIVE**        CURRENT THERAPY:  H/o R breast cancer 2003, s/p R lumpectomy, adjuvant chemo, RT, and 5 years anti-estrogen Recurrence/new R breat cancer 2018 s/p b/l mastectomy (low risk oncotype), and anti-estrogen 2018 - 12/2021 Recurrence/new R breast IDC ER/PR + HER2 - PENDING lumpectomy  INTERVAL HISTORY Ms. Higgerson returns for follow up as scheduled. Last seen by Dr. Mosetta Putt 12/30/21 for routine surveillance and stopped exemestane. Noticed that skin was "shiny" and palpated a small lump. Brought to plastic surgeon's attention, R [place] 06/19/22 showed a 5 x 2 x 2 mm irregular hypoechoic mass in the right breast 1 o'clock position 3 cmfn, biopsy confirmed recurrent invasive ductal carcinoma, grade 2, ER 100% strongly positive, PR 100% strongly positive, HER2 equivocal but negative by FISH, with a Ki-67 of 20%.  She met with Dr. Carolynne Edouard yesterday who is  planning MRI and lumpectomy but not scheduled yet. She feels well overall, healthy and active.  She walks every day, planning a few trips coming up.  Denies unintentional weight loss, decreased appetite, bone pain, abdominal pain/bloating, or any other new specific complaints.  ROS  All other systems reviewed and negative  Past Medical History:  Diagnosis Date   Adjustment insomnia 02/23/2019   Breast cancer (HCC) 2018   Lumpectomy, chemotherapy and radiation with recurrence 2018. Now on Femara   Cervical radiculopathy 07/05/2019   COVID-19 02/2019   Her husband also passed away at this time from COVID-19.   Family history of breast cancer    Genetic testing 10/30/2016   Breast/GYN panel (23 genes) @ [facility] - No pathogenic mutations detected   GERD (gastroesophageal reflux disease)    OTC medication   Grief reaction 02/23/2019   Heart murmur    as a child - Echo in early 20s "ok"   History of echocardiogram 05/2015   Echo 4/17: EF 55-60%, GLS -19.6%, normal wall motion   History of exercise stress test 05/2015   ETT 4/17 - No ST changes   HLD (hyperlipidemia)    Hypothyroidism    Multiple thyroid nodules 2009   Multiple nodules: Normal ultrasound reported at diagnosis   Osteoporosis    Personal history of chemotherapy    Personal history of radiation therapy    UNEQUAL LEG LENGTH, ACQUIRED 11/17/2006   Qualifier: Diagnosis of  By: Darrick Penna MD, KARL       Past Surgical History:  Procedure Laterality Date   ABDOMINAL HYSTERECTOMY     BREAST BIOPSY Right  07/01/2022   [place] RT BREAST BX W LOC DEV 1ST LESION IMG BX SPEC US GUIDE 07/01/2022 [facility]   BREAST LUMPECTOMY Right 2003   BREAST RECONSTRUCTION WITH PLACEMENT OF TISSUE EXPANDER AND FLEX HD (ACELLULAR HYDRATED DERMIS) Bilateral 11/17/2016   Procedure: IMEDITATE BREAST RECONSTRUCTION WITH PLACEMENT OF TISSUE EXPANDER AND FLEX HD (ACELLULAR HYDRATED DERMIS);  Surgeon: Peggye Form, DO;  Location: [facility] OR;   Service: Plastics;  Laterality: Bilateral;   BREAST SURGERY  2003 /right   lumpectomy; chemotherapy and radiation   LIPOSUCTION WITH LIPOFILLING Bilateral 02/23/2020   Procedure: Fat filling of breasts for symmetry from abdomen;  Surgeon: Peggye Form, DO;  Location: [facility];  Service: Plastics;  Laterality: Bilateral;  90 min, please   LIPOSUCTION WITH LIPOFILLING Bilateral 05/16/2021   Procedure: LIPOSUCTION WITH LIPOFILLING;  Surgeon: Peggye Form, DO;  Location: [facility];  Service: Plastics;  Laterality: Bilateral;  1.5 hours   REMOVAL OF BILATERAL TISSUE EXPANDERS WITH PLACEMENT OF BILATERAL BREAST IMPLANTS Bilateral 02/11/2017   Procedure: REMOVAL OF BILATERAL TISSUE EXPANDERS WITH PLACEMENT OF BILATERAL SILICONE BREAST IMPLANTS;  Surgeon: Peggye Form, DO;  Location:  SURGERY CENTER;  Service: Plastics;  Laterality: Bilateral;   TOTAL ABDOMINAL HYSTERECTOMY W/ BILATERAL SALPINGOOPHORECTOMY     for treatment of breast treatment   TOTAL MASTECTOMY Bilateral 11/17/2016   Procedure: RIGHT MASTECTOMY AND LEFT PROPHYLATIC MASTECTOMY;  Surgeon: Griselda Miner, MD;  Location: [facility] OR;  Service: General;  Laterality: Bilateral;     Outpatient Encounter Medications as of 07/10/2022  Medication Sig   atorvastatin (LIPITOR) 20 MG tablet Take 1 tablet (20 mg total) by mouth daily.   Calcium 500 MG CHEW Chew by mouth.   Collagenase POWD by Does not apply route.   diclofenac (VOLTAREN) 75 MG EC tablet Take 1 tablet (75 mg total) by mouth daily.   ELDERBERRY PO Take by mouth.   psyllium (METAMUCIL) 58.6 % powder Take 1 packet by mouth daily.   Vitamin D, Cholecalciferol, 25 MCG (1000 UT) CAPS Take 1 capsule by mouth daily.   Facility-Administered Encounter Medications as of 07/10/2022  Medication   ketorolac (TORADOL) 15 MG/ML injection 15 mg     Today's Vitals   07/10/22 1002  BP: 129/73  Pulse: 78  Resp: 16  Temp: 97.8 F (36.6  C)  TempSrc: Oral  SpO2: 100%  Weight: 139 lb 4.8 oz (63.2 kg)   Body mass index is 23.64 kg/m.   PHYSICAL EXAM GENERAL:alert, no distress and comfortable SKIN: no rash  EYES: sclera clear NECK: without mass LYMPH:  no palpable cervical or supraclavicular lymphadenopathy  LUNGS:  normal breathing effort HEART:  no lower extremity edema ABDOMEN: abdomen soft, non-tender and normal bowel sounds NEURO: alert & oriented x 3 with fluent speech, no focal motor/sensory deficits Breast exam: s/p bilateral mastectomy with reconstruction and nipple tattoo, R UIQ with mild ecchymosis from bx and very subtle skin thickening vs nodule. No other palpable mass or nodularity in either breast/chest wall or axilla     CBC    Component Value Date/Time   WBC 6.6 07/10/2022 1116   WBC 4.0 05/26/2022 0902   RBC 5.03 07/10/2022 1116   HGB 15.0 07/10/2022 1116   HGB 13.9 01/22/2017 1028   HCT 44.8 07/10/2022 1116   HCT 41.7 01/22/2017 1028   PLT 279 07/10/2022 1116   PLT 252 01/22/2017 1028   MCV 89.1 07/10/2022 1116   MCV 85.9 01/22/2017 1028  MCH 29.8 07/10/2022 1116   MCHC 33.5 07/10/2022 1116   RDW 14.1 07/10/2022 1116   RDW 13.7 01/22/2017 1028   LYMPHSABS 1.5 07/10/2022 1116   LYMPHSABS 1.5 01/22/2017 1028   MONOABS 0.4 07/10/2022 1116   MONOABS 0.4 01/22/2017 1028   EOSABS 0.1 07/10/2022 1116   EOSABS 0.1 01/22/2017 1028   BASOSABS 0.0 07/10/2022 1116   BASOSABS 0.0 01/22/2017 1028     CMP     Component Value Date/Time   NA 141 07/10/2022 1116   NA 141 01/22/2017 1028   K 3.9 07/10/2022 1116   K 4.4 01/22/2017 1028   CL 103 07/10/2022 1116   CO2 30 07/10/2022 1116   CO2 27 01/22/2017 1028   GLUCOSE 97 07/10/2022 1116   GLUCOSE 84 01/22/2017 1028   BUN 26 (H) 07/10/2022 1116   BUN 18.0 01/22/2017 1028   CREATININE 1.16 (H) 07/10/2022 1116   CREATININE 1.0 01/22/2017 1028   CALCIUM 10.2 07/10/2022 1116   CALCIUM 9.7 01/22/2017 1028   PROT 7.5 07/10/2022 1116   PROT  7.5 01/22/2017 1028   ALBUMIN 5.0 07/10/2022 1116   ALBUMIN 4.5 01/22/2017 1028   AST 36 07/10/2022 1116   AST 29 01/22/2017 1028   ALT 36 07/10/2022 1116   ALT 35 01/22/2017 1028   ALKPHOS 77 07/10/2022 1116   ALKPHOS 58 01/22/2017 1028   BILITOT 0.7 07/10/2022 1116   BILITOT 0.47 01/22/2017 1028   GFRNONAA 54 (L) 07/10/2022 1116   GFRAA >60 11/04/2019 1026     ASSESSMENT & PLAN: NADJAH LAWING is a 62 y.o.  postmenopausal Caucasian female with a history of Heart murmur, HLD, and osteoporosis.   1. Malignant neoplasm of upper-inner quadrant of right breast, invasive ductal carcinoma, pT2N0, M0), Stage 1B, ER/PR: positive, HER2 negative, Grade 3, Oncotype RS 21 -diagnosed 10/2016, s/p bilateral mastectomy and reconstruction. Based on oncotype RS she did not need adjuvant chemotherapy. And given node-negative mastectomy she did not require adjuvant radiation -started adjuvant letrozole 12/2016. She was changed to exemestane 11/2019 due to arthralgia.  Planned for a total of 7 years but stopped 12/2021 due to worsening bone density.  -Ms. Valiant appears well. We reviewed US/bx which showed recurrent vs new R breast IDC, G2, ER/PR strongly positive, HER2 negative, with Ki67 20% -We recommend staging work up to r/o distant metastasis.  If negative she will proceed with lumpectomy with Dr. Carolynne Edouard soon.  -We discussed the role of anti-estrogen therapy after surgery, she tolerated exemestane best and agrees to resume post- op -I will call her with staging results and arrange f/up after surgery -Pt seen with Dr. Mosetta Putt    2. H/o right breast cancer pT2N1M0 ER/PR+ HER2 - 2003 -s/p lumpectomy and ALND (3/7 + LNs), chemo, radiation, and tamoxifen for 2 years then anastrozole for 3 years -s/p TAH/BSO 2006 -treated by Dr. Pierce Crane    3. Genetic testing negative    4. Osteoporosis -initially on fosamax x5 years, then on prolia q6 months 03/2015 - 11/2020 -Repeat DEXA 11/2021 showed worsening T  score -2.4 of RFN -She fell 11/2021 and fractured C7 -She began Zometa (q6 month) on 12/2021, will receive second dose today     PLAN: -Recent US/biopsy and today's labs reviewed -2nd dose Zometa today -Staging PET scan for recurrent vs new R breast cancer, will call with results -If negative, proceed with lumpectomy by Dr. Carolynne Edouard -F/up post op, to review final path and resume AI (exemestane)  -Pt seen with Dr.  Feng    Orders Placed This Encounter  Procedures   NM PET Image Initial (PI) Skull Base To Thigh    Standing Status:   Future    Standing Expiration Date:   07/10/2023    Order Specific Question:   If indicated for the ordered procedure, I authorize the administration of a radiopharmaceutical per Radiology protocol    Answer:   Yes    Order Specific Question:   Preferred imaging location?    Answer:   [facility]   CBC with Differential (Cancer Center Only)    Standing Status:   Future    Number of Occurrences:   1    Standing Expiration Date:   07/10/2023   CMP (Cancer Center only)    Standing Status:   Future    Number of Occurrences:   1    Standing Expiration Date:   07/10/2023   CA 27.29    Standing Status:   Future    Number of Occurrences:   1    Standing Expiration Date:   07/10/2023      All questions were answered. The patient knows to call the clinic with any problems, questions or concerns. No barriers to learning were detected.   Santiago Glad, NP-C 07/10/2022  Addendum I have seen the patient, examined her. I agree with the assessment and and plan and have edited the notes.   Korah had bilateral mastectomy for stage I right breast cancer, completed 5 years of aromatase inhibitor in November 2023.  She now has a small new breast cancer in right breast, prognostic markers is similar to her previous cancer, discussed to be a local recurrence, or new breast primary.  Will obtain staging PET or CT/bone scan, to rule out distant metastasis.  If this is negative, she  will proceed with surgical resection. Given the small tumor, strong ER and PR positive disease, and HER2 negative, I do not think she needs adjuvant chemotherapy.  I recommend additional 5 to 7 years of aromatase inhibitor after her surgery.  She is agreeable. Will reach out to Dr. Roselind Messier to see if she would need adjuvant radiation, due to probable local recurrence.  Malachy Mood MD 07/10/2022

## 2022-07-11 LAB — CANCER ANTIGEN 27.29: CA 27.29: 19.2 U/mL (ref 0.0–38.6)

## 2022-07-15 ENCOUNTER — Other Ambulatory Visit: Payer: Self-pay

## 2022-07-15 DIAGNOSIS — M8000XD Age-related osteoporosis with current pathological fracture, unspecified site, subsequent encounter for fracture with routine healing: Secondary | ICD-10-CM

## 2022-07-15 DIAGNOSIS — C50211 Malignant neoplasm of upper-inner quadrant of right female breast: Secondary | ICD-10-CM

## 2022-07-16 ENCOUNTER — Other Ambulatory Visit: Payer: Self-pay | Admitting: General Surgery

## 2022-07-16 ENCOUNTER — Encounter: Payer: Self-pay | Admitting: *Deleted

## 2022-07-16 ENCOUNTER — Other Ambulatory Visit: Payer: Self-pay | Admitting: Nurse Practitioner

## 2022-07-16 DIAGNOSIS — Z170 Estrogen receptor positive status [ER+]: Secondary | ICD-10-CM

## 2022-07-28 ENCOUNTER — Telehealth: Payer: BC Managed Care – PPO | Admitting: Plastic Surgery

## 2022-07-31 ENCOUNTER — Ambulatory Visit: Payer: BC Managed Care – PPO | Admitting: Nurse Practitioner

## 2022-07-31 ENCOUNTER — Ambulatory Visit: Payer: BC Managed Care – PPO

## 2022-07-31 ENCOUNTER — Other Ambulatory Visit: Payer: BC Managed Care – PPO

## 2022-08-06 ENCOUNTER — Encounter (HOSPITAL_COMMUNITY)
Admission: RE | Admit: 2022-08-06 | Discharge: 2022-08-06 | Disposition: A | Discharge status: Home / Self Care | Payer: BC Managed Care – PPO | Source: Ambulatory Visit | Attending: Nurse Practitioner | Admitting: Nurse Practitioner

## 2022-08-06 ENCOUNTER — Ambulatory Visit (HOSPITAL_COMMUNITY)
Admission: RE | Admit: 2022-08-06 | Discharge: 2022-08-06 | Disposition: A | Discharge status: Home / Self Care | Payer: BC Managed Care – PPO | Source: Ambulatory Visit | Attending: Nurse Practitioner | Admitting: Nurse Practitioner

## 2022-08-06 DIAGNOSIS — C50211 Malignant neoplasm of upper-inner quadrant of right female breast: Secondary | ICD-10-CM | POA: Insufficient documentation

## 2022-08-06 DIAGNOSIS — E041 Nontoxic single thyroid nodule: Secondary | ICD-10-CM | POA: Diagnosis not present

## 2022-08-06 DIAGNOSIS — Z170 Estrogen receptor positive status [ER+]: Secondary | ICD-10-CM | POA: Insufficient documentation

## 2022-08-06 DIAGNOSIS — C50911 Malignant neoplasm of unspecified site of right female breast: Secondary | ICD-10-CM | POA: Diagnosis not present

## 2022-08-06 MED ORDER — IOHEXOL 300 MG/ML  SOLN
100.0000 mL | Freq: Once | INTRAMUSCULAR | Status: AC | PRN
  Administered 2022-08-06: 100 mL via INTRAVENOUS

## 2022-08-06 MED ORDER — TECHNETIUM TC 99M MEDRONATE IV KIT
20.0000 | PACK | Freq: Once | INTRAVENOUS | Status: AC | PRN
  Administered 2022-08-06: 20.3 via INTRAVENOUS

## 2022-08-08 ENCOUNTER — Encounter: Payer: Self-pay | Admitting: *Deleted

## 2022-08-08 ENCOUNTER — Encounter (HOSPITAL_BASED_OUTPATIENT_CLINIC_OR_DEPARTMENT_OTHER): Payer: Self-pay | Admitting: General Surgery

## 2022-08-08 ENCOUNTER — Other Ambulatory Visit: Payer: Self-pay

## 2022-08-08 NOTE — Progress Notes (Signed)
   08/08/22 1202  Pre-op Phone Call  Surgery Date Verified 08/18/22  Arrival Time Verified 0615  Surgery Location Verified [facility] [place]  Medical History Reviewed Yes  Is the patient taking a GLP-1 receptor agonist? No  Does the patient have diabetes? No diagnosis of diabetes  Do you have a history of heart problems? No  Antiarrhythmic device type  (NA)  Does patient have other implanted devices? No  Patient Teaching Enhanced Recovery;Pre-op CHG Bathing  Patient educated about smoking cessation 24 hours prior to surgery. N/A Non-Smoker  Patient verbalizes understanding of bowel prep? N/A  THA/TKA patients only:  By your surgery date, will you have been taking narcotics for 90 days or greater? No  Med Rec Completed Yes  Take the Following Meds the Morning of Surgery no meds AM of sx, hold herb/supp/MVI x5d  Recent  Lab Work, EKG, CXR? No  NPO (Including gum & candy) After midnight  Patient instructed to stop clear liquids including Carb loading drink at: 0415  Stop Solids, Milk, Candy, and Gum STARTING AT MIDNIGHT  Responsible adult to drive and be with you for 24 hours? Yes  Name & Phone Number for Ride/Caregiver sister, Lenell Antu, money, nail polish or make-up.  No lotions, powders, perfumes. No shaving  48 hrs. prior to surgery. Yes  Contacts, Dentures & Glasses Will Have to be Removed Before OR. Yes  Please bring your ID and Insurance Card the morning of your surgery. (Surgery Centers Only) Yes  Bring any papers or x-rays with you that your surgeon gave you. Yes  Instructed to contact the location of procedure/ provider if they or anyone in their household develops symptoms or tests positive for COVID-19, has close contact with someone who tests positive for COVID, or has known exposure to any contagious illness. Yes  Call this number the morning of surgery  with any problems that may cancel your surgery. [phone]  Covid-19 Assessment  Have you had a positive COVID-19 test  within the previous 90 days? No  COVID Testing Guidance Proceed with the additional questions.  Patient's surgery required a COVID-19 test (cardiothoracic, complex ENT, and bronchoscopies/ EBUS) No  Have you been unmasked and in close contact with anyone with COVID-19 or COVID-19 symptoms within the past 10 days? No  Do you or anyone in your household currently have any COVID-19 symptoms? No

## 2022-08-11 ENCOUNTER — Inpatient Hospital Stay: Payer: BC Managed Care – PPO | Attending: Nurse Practitioner | Admitting: Nurse Practitioner

## 2022-08-11 ENCOUNTER — Encounter: Payer: Self-pay | Admitting: Nurse Practitioner

## 2022-08-11 DIAGNOSIS — Z79810 Long term (current) use of selective estrogen receptor modulators (SERMs): Secondary | ICD-10-CM | POA: Insufficient documentation

## 2022-08-11 DIAGNOSIS — C50211 Malignant neoplasm of upper-inner quadrant of right female breast: Secondary | ICD-10-CM | POA: Diagnosis not present

## 2022-08-11 DIAGNOSIS — Z9013 Acquired absence of bilateral breasts and nipples: Secondary | ICD-10-CM | POA: Insufficient documentation

## 2022-08-11 DIAGNOSIS — Z170 Estrogen receptor positive status [ER+]: Secondary | ICD-10-CM | POA: Insufficient documentation

## 2022-08-11 NOTE — Progress Notes (Signed)
Patient Care Team: Natalia Leatherwood, DO as PCP - General (Family Medicine) Malachy Mood, MD as Consulting Physician (Hematology) Griselda Miner, MD as Consulting Physician (General Surgery) Antony Blackbird, MD as Consulting Physician (Radiation Oncology) Mardella Layman, MD as Consulting Physician (Gastroenterology) Richarda Overlie, MD as Consulting Physician (Obstetrics and Gynecology) Axel Filler Larna Daughters, NP as Nurse Practitioner (Hematology and Oncology) Reather Littler, MD as Consulting Physician (Endocrinology)   I connected with Jenetta Loges on 08/11/22 at 11:00 AM EDT by telephone visit and verified that I am speaking with the correct person using two identifiers.   I discussed the limitations, risks, security and privacy concerns of performing an evaluation and management service by telemedicine and the availability of in-person appointments. I also discussed with the patient that there may be a patient responsible charge related to this service. The patient expressed understanding and agreed to proceed.   Other persons participating in the visit and their role in the encounter: Vincent Gros, NP  Patient's location: Home  Provider's location: [facility] office    Chief Complaint: Staging work up results    Oncology History Overview Note  Cancer Staging Malignant neoplasm of upper-inner quadrant of right breast in female, estrogen receptor positive (HCC) Staging form: Breast, AJCC 8th Edition - Clinical stage from 10/09/2016: Stage IA (cT1c, cN0, cM0, G3, ER: Positive, PR: Positive, HER2: Negative) - Signed by Malachy Mood, MD on 10/14/2016 - Pathologic stage from 11/17/2016: Stage IB (pT2, pN0, cM0, G3, ER: Positive, PR: Positive, HER2: Negative, Oncotype DX score: 21) - Signed by Malachy Mood, MD on 12/11/2016     Malignant neoplasm of upper-inner quadrant of right breast in female, estrogen receptor positive (HCC)  10/08/2016 Mammogram   Diagnostic mammogram and ultrasound showed an  irregular mass in the right breast at 1 o'clock, 5 cm from the nipple measuring 1.6 x 1.4x 1.6 cm, axilla was negative for adenopathy.   10/09/2016 Receptors her2   Estrogen Receptor: 80%, POSITIVE, STRONG STAINING INTENSITY Progesterone Receptor: 30%, POSITIVE, STRONG STAINING INTENSITY Proliferation Marker Ki67: 35% HER2 NEGATIVE    10/09/2016 Initial Diagnosis   Malignant neoplasm of upper-inner quadrant of right breast in female, estrogen receptor positive (HCC)   10/09/2016 Initial Biopsy   Diagnosis Breast, right, needle core biopsy, upper inner quadrant, 1:00 o'clock - INVASIVE DUCTAL CARCINOMA, G3   10/2016 Genetic Testing   Genetic testing was negative for The genes analyzed were the 23 genes on [facility]'s Breast/GYN panel (ATM, BARD1, BRCA1, BRCA2, BRIP1, CDH1, CHEK2, DICER1, EPCAM, MLH1,  MSH2, MSH6, NBN, NF1, PALB2, PMS2, PTEN, RAD50, RAD51C, RAD51D,SMARCA4, STK11, and TP53).     11/17/2016 Surgery   RIGHT MASTECTOMY AND LEFT PROPHYLATIC MASTECTOMY by Dr. Carolynne Edouard  IMEDITATE BREAST RECONSTRUCTION WITH PLACEMENT OF TISSUE EXPANDER AND FLEX HD (ACELLULAR HYDRATED DERMIS) by Dr. Ulice Bold     11/17/2016 Pathology Results   Diagnosis 11/17/16 1. Breast, simple mastectomy, Left - BENIGN BREAST TISSUE WITH ECTATIC DUCTS. - ONE OF ONE LYMPH NODE NEGATIVE FOR CARCINOMA (0/1). 2. Breast, simple mastectomy, Right - INVASIVE DUCTAL CARCINOMA, GRADE 3, SPANNING 2.4 CM. - HIGH GRADE DUCTAL CARCINOMA IN SITU. - INVASIVE CARCINOMA IS <0.1 CM OF THE POSTERIOR MARGIN (BROADLY) AND ANTERIOR MARGIN (FOCALLY). - IN SITU CARCINOMA IS >0.2 CM OF BOTH MARGINS. - PRIOR LUMPECTOMY SITE, NEGATIVE FOR CARCINOMA. - SEE ONCOLOGY TABLE. 3. Breast, excision, Superior flap deep margin - BENIGN FIBROADIPOSE TISSUE.   11/17/2016 Miscellaneous   Oncotype on 11/17/16 Rcurence Risk Score of 21 10-Year risk  of distant reucrrence with Tamoxifen alone is 13%    12/18/2016 - 12/30/2021 Anti-estrogen oral  therapy   Adjuvant Letrozole 2.5 mg daily; began 12/18/16. Due to arthritis I switched her to Exemestane 25mg  daily in 11/2019.    12/24/2016 Imaging   BONE SCAN IMPRESSION: No evidence of osseous metastatic disease.   12/24/2016 Imaging   CT CAP IMPRESSION: 1. Status post bilateral modified radical mastectomy and breast reconstruction, as above. No findings to suggest metastatic disease in the chest, abdomen or pelvis. 2. Multiple heterogeneous appearing thyroid nodules, as discussed above. These are nonspecific but further evaluation with nonemergent thyroid ultrasound is recommended in the near future to determine any potential need for further evaluation with fine-needle aspiration. 3. Incidental findings, as above.   02/11/2017 Surgery   REMOVAL OF BILATERAL TISSUE EXPANDERS WITH PLACEMENT OF BILATERAL SILICONE BREAST IMPLANTS by Dr. Ulice Bold    06/19/2022 Imaging   R AX [place]:  Targeted ultrasound is performed, showing a 5 x 2 x 2 mm irregular hypoechoic mass right breast 1 o'clock position 3 cm from nipple at the site of palpable concern. No right axillary adenopathy.   IMPRESSION: Indeterminate palpable mass right breast 1 o'clock position.   07/01/2022 Pathology Results   Recurrence/new   Breast, right, needle core biopsy, 1 o'clock, 3cmfn, hydromark clip INVASIVE DUCTAL CARCINOMA, RECURRENT OVERALL GRADE: 2  PROGNOSTIC INDICATORS The tumor cells are EQUIVOCAL for Her2 (2+).  Estrogen Receptor: 100%, POSITIVE, STRONG STAINING INTENSITY Progesterone Receptor: 100%, POSITIVE, STRONG STAINING INTENSITY Proliferation Marker Ki67: 20%  1A. Breast, righnt,needle core biopsy, 1 o'clock, 3 cmfn FLUORESCENCE IN-SITU HYBRIDIZATION Results: GROUP 5: HER2 **NEGATIVE**        CURRENT THERAPY:  H/o R breast cancer 2003, s/p R lumpectomy, adjuvant chemo, RT, and 5 years anti-estrogen Recurrence/new R breat cancer 2018 s/p b/l mastectomy (low risk oncotype), and  anti-estrogen 2018 - 12/2021 Recurrence/new R breast IDC ER/PR + HER2 - PENDING lumpectomy and adjuvant AI  INTERVAL HISTORY Olivia [name] presents by phone to review staging workup as scheduled.  She does give some back story that she fell in 11/2021 and fractured C7.  Denies changes since last visit, feeling well in general.  No headaches.  Surgery is scheduled on 7/15.  ROS  All other systems reviewed and negative   Past Medical History:  Diagnosis Date   Adjustment insomnia 02/23/2019   Breast cancer (HCC) 2018   Lumpectomy, chemotherapy and radiation with recurrence 2018. Now on Femara   Cervical radiculopathy 07/05/2019   COVID-19 02/2019   Her husband also passed away at this time from COVID-19.   Family history of breast cancer    Genetic testing 10/30/2016   Breast/GYN panel (23 genes) @ [facility] - No pathogenic mutations detected   GERD (gastroesophageal reflux disease)    OTC medication   Grief reaction 02/23/2019   Heart murmur    as a child - Echo in early 20s "ok"   History of echocardiogram 05/2015   Echo 4/17: EF 55-60%, GLS -19.6%, normal wall motion   History of exercise stress test 05/2015   ETT 4/17 - No ST changes   HLD (hyperlipidemia)    Hypothyroidism    Multiple thyroid nodules 2009   Multiple nodules: Normal ultrasound reported at diagnosis   Osteoporosis    Personal history of chemotherapy    Personal history of radiation therapy    UNEQUAL LEG LENGTH, ACQUIRED 11/17/2006   Qualifier: Diagnosis of  By: Darrick Penna MD, [place]  Past Surgical History:  Procedure Laterality Date   ABDOMINAL HYSTERECTOMY     BREAST BIOPSY Right 07/01/2022   [place] RT BREAST BX W LOC DEV 1ST LESION IMG BX SPEC US GUIDE 07/01/2022 [facility]   BREAST LUMPECTOMY Right 2003   BREAST RECONSTRUCTION WITH PLACEMENT OF TISSUE EXPANDER AND FLEX HD (ACELLULAR HYDRATED DERMIS) Bilateral 11/17/2016   Procedure: IMEDITATE BREAST RECONSTRUCTION WITH PLACEMENT OF TISSUE EXPANDER AND  FLEX HD (ACELLULAR HYDRATED DERMIS);  Surgeon: Peggye Form, DO;  Location: [facility] OR;  Service: Plastics;  Laterality: Bilateral;   BREAST SURGERY  2003 /right   lumpectomy; chemotherapy and radiation   LIPOSUCTION WITH LIPOFILLING Bilateral 02/23/2020   Procedure: Fat filling of breasts for symmetry from abdomen;  Surgeon: Peggye Form, DO;  Location: [facility];  Service: Plastics;  Laterality: Bilateral;  90 min, please   LIPOSUCTION WITH LIPOFILLING Bilateral 05/16/2021   Procedure: LIPOSUCTION WITH LIPOFILLING;  Surgeon: Peggye Form, DO;  Location: [facility];  Service: Plastics;  Laterality: Bilateral;  1.5 hours   REMOVAL OF BILATERAL TISSUE EXPANDERS WITH PLACEMENT OF BILATERAL BREAST IMPLANTS Bilateral 02/11/2017   Procedure: REMOVAL OF BILATERAL TISSUE EXPANDERS WITH PLACEMENT OF BILATERAL SILICONE BREAST IMPLANTS;  Surgeon: Peggye Form, DO;  Location: [facility];  Service: Plastics;  Laterality: Bilateral;   TOTAL ABDOMINAL HYSTERECTOMY W/ BILATERAL SALPINGOOPHORECTOMY     for treatment of breast treatment   TOTAL MASTECTOMY Bilateral 11/17/2016   Procedure: RIGHT MASTECTOMY AND LEFT PROPHYLATIC MASTECTOMY;  Surgeon: Griselda Miner, MD;  Location: [facility];  Service: General;  Laterality: Bilateral;     Outpatient Encounter Medications as of 08/11/2022  Medication Sig   atorvastatin (LIPITOR) 20 MG tablet Take 1 tablet (20 mg total) by mouth daily.   Calcium 500 MG CHEW Chew by mouth.   Collagenase POWD by Does not apply route.   diclofenac (VOLTAREN) 75 MG EC tablet Take 1 tablet (75 mg total) by mouth daily.   ELDERBERRY PO Take by mouth.   psyllium (METAMUCIL) 58.6 % powder Take 1 packet by mouth daily.   Vitamin D, Cholecalciferol, 25 MCG (1000 UT) CAPS Take 1 capsule by mouth daily.   No facility-administered encounter medications on file as of 08/11/2022.     There were no vitals filed for this visit. There  is no height or weight on file to calculate BMI.   PHYSICAL EXAM Patient appears well by phone.  Voice is strong, speech is clear.  Mood/affect appear normal for situation.  No cough or conversational dyspnea  NO LAB DATA FOR THIS VISIT     ASSESSMENT & PLAN:Olivia [name] is a 63 y.o.  postmenopausal Caucasian female with a history of Heart murmur, HLD, and osteoporosis.   1. Malignant neoplasm of upper-inner quadrant of right breast, invasive ductal carcinoma, pT2N0, M0), Stage 1B, ER/PR: positive, HER2 negative, Grade 3, Oncotype RS 21 -diagnosed 10/2016, s/p bilateral mastectomy and reconstruction. Based on oncotype RS she did not need adjuvant chemotherapy. And given node-negative mastectomy she did not require adjuvant radiation -started adjuvant letrozole 12/2016. She was changed to exemestane 11/2019 due to arthralgia.  Planned for a total of 7 years but stopped 12/2021 due to worsening bone density.  -Recent US/bx showed recurrent vs new R breast IDC, G2, ER/PR strongly positive, HER2 negative, with Ki67 20% -I reviewed staging work up personally and discussed with Dr. Mosetta Putt. CT CAP is negative for distant metastasis. Bone scan shows update in joints  likely degenerative, subtle indeterminate update in the ribs without CT correlate, and focal uptake in the posterior calvarium, new from 2018. A CT head is recommended.  -She fell 11/2021 and fractured C7, a head CT from that date does not mention an abnormality in this region -I have messaged radiologist Dr. Caprice Beaver to comment, but if I do not hear back she is OK to proceed with repeat CT head wo contrast to f/up the bone scan finding -Otherwise planning lumpectomy 7/15.  -We will see her in 4 weeks to review final surgical path and start exemestane if she has recovered well.   2. H/o right breast cancer pT2N1M0 ER/PR+ HER2 - 2003 -s/p lumpectomy and ALND (3/7 + LNs), chemo, radiation, and tamoxifen for 2 years then anastrozole for 3  years -s/p TAH/BSO 2006 -treated by Dr. Pierce Crane    3. Genetic testing negative    4. Osteoporosis -initially on fosamax x5 years, then on prolia q6 months 03/2015 - 11/2020 -Repeat DEXA 11/2021 showed worsening T score -2.4 of RFN -She fell 11/2021 and fractured C7 -She began Zometa (q6 month) on 12/2021; s/p 2 doses      PLAN: -CT CAP/bone scan reviewed -Proceed with CT head wo contrast, will call with results  -Lumpectomy scheduled 7/15 Carolynne Edouard) -F/up in 4 weeks, to review surgical path and start AI  Orders Placed This Encounter  Procedures   CT HEAD WO CONTRAST ( )    Standing Status:   Future    Standing Expiration Date:   08/11/2023    Order Specific Question:   Preferred imaging location?    Answer:   [facility]     I discussed the assessment and treatment plan with the patient. The patient was provided an opportunity to ask questions and all were answered. The patient agreed with the plan and demonstrated an understanding of the instructions.   The patient was advised to call back or seek an in-person evaluation if the symptoms worsen or if the condition fails to improve as anticipated. I spent 8 minutes on the phone counseling the patient non face to face. The total time spent in the appointment was 15 minutes and more than 50% was on counseling, review of test results, and coordination of care.   Santiago Glad, NP-C 08/11/2022

## 2022-08-12 ENCOUNTER — Ambulatory Visit (INDEPENDENT_AMBULATORY_CARE_PROVIDER_SITE_OTHER): Payer: BC Managed Care – PPO | Admitting: Plastic Surgery

## 2022-08-12 ENCOUNTER — Encounter: Payer: Self-pay | Admitting: Plastic Surgery

## 2022-08-12 ENCOUNTER — Ambulatory Visit (HOSPITAL_BASED_OUTPATIENT_CLINIC_OR_DEPARTMENT_OTHER)
Admission: RE | Admit: 2022-08-12 | Discharge: 2022-08-12 | Disposition: A | Discharge status: Home / Self Care | Payer: BC Managed Care – PPO | Source: Ambulatory Visit | Attending: Nurse Practitioner | Admitting: Nurse Practitioner

## 2022-08-12 ENCOUNTER — Other Ambulatory Visit: Payer: Self-pay

## 2022-08-12 ENCOUNTER — Telehealth: Payer: Self-pay

## 2022-08-12 VITALS — BP 135/87 | HR 69 | Ht 64.0 in | Wt 142.4 lb

## 2022-08-12 DIAGNOSIS — R937 Abnormal findings on diagnostic imaging of other parts of musculoskeletal system: Secondary | ICD-10-CM | POA: Diagnosis not present

## 2022-08-12 DIAGNOSIS — C50211 Malignant neoplasm of upper-inner quadrant of right female breast: Secondary | ICD-10-CM

## 2022-08-12 DIAGNOSIS — Z9013 Acquired absence of bilateral breasts and nipples: Secondary | ICD-10-CM | POA: Diagnosis not present

## 2022-08-12 DIAGNOSIS — Z923 Personal history of irradiation: Secondary | ICD-10-CM | POA: Diagnosis not present

## 2022-08-12 DIAGNOSIS — Z9889 Other specified postprocedural states: Secondary | ICD-10-CM

## 2022-08-12 DIAGNOSIS — Z170 Estrogen receptor positive status [ER+]: Secondary | ICD-10-CM

## 2022-08-12 DIAGNOSIS — C50919 Malignant neoplasm of unspecified site of unspecified female breast: Secondary | ICD-10-CM | POA: Diagnosis not present

## 2022-08-12 NOTE — Telephone Encounter (Addendum)
Forwarded the message to Karna Christmas to get the scan scheduled for this week.    ----- Message from Pollyann Samples, NP sent at 08/11/2022  5:49 PM EDT ----- Good evening, I have ordered CT head wo contrast to be done this week if possible. Pt having surgery 7/15 hopefully can be done prior. Please help.  Thanks, Clayborn Heron

## 2022-08-12 NOTE — Progress Notes (Signed)
   Subjective:    Patient ID: Olivia [name], female    DOB: 1960-04-27, 62 y.o.   MRN: [id number]  The patient is a 62 year old female here for evaluation of her breasts.  In 2018 she had a mammogram which showed invasive ductal carcinoma after the biopsy of the right breast.  It was estrogen and progesterone positive and HER2 negative.  She was at 135 pounds at the time 5 feet 4 inches tall.  She had a right lumpectomy in 2003 with chemo and radiation.  In October 2018 we did immediate bilateral breast reconstruction with placement of ADM and tissue expanders.  She had her exchange to implants in January 2019 with [place]or smooth round 350 cc silicone implants that were high profile.  In January 2022 she had lipo filling for improved symmetry.  More lipo filling was done in April 2023.  He also had the nipple areola tattoos.  In May she complained about a little lesion around the 1 o'clock position of the right breast.  It felt like a cyst but due to her history we went ahead with workup.  In May 2024 she had a biopsy and it came back invasive ductal carcinoma recurrent.  She is now scheduled for a lumpectomy with Dr. Carolynne [name] this coming Monday.  I do not feel any lesion today and everything looks good with no sign of hematoma, seroma or infection in either breast.  Patient has a great attitude and is preparing for a trip to [place] in the next few months.      Review of Systems  Constitutional:  Negative for activity change and appetite change.  Eyes: Negative.   Respiratory: Negative.    Cardiovascular: Negative.   Gastrointestinal: Negative.   Genitourinary: Negative.   Musculoskeletal: Negative.   Neurological: Negative.        Objective:   Physical Exam Vitals and nursing note reviewed.  Constitutional:      Appearance: Normal appearance.  HENT:     Head: Normocephalic and atraumatic.  Cardiovascular:     Rate and Rhythm: Normal rate.     Pulses: Normal pulses.  Pulmonary:      Effort: Pulmonary effort is normal.  Musculoskeletal:        General: No swelling, tenderness or deformity.  Skin:    General: Skin is warm.     Capillary Refill: Capillary refill takes less than 2 seconds.  Neurological:     Mental Status: She is alert and oriented to person, place, and time.  Psychiatric:        Mood and Affect: Mood normal.        Behavior: Behavior normal.        Thought Content: Thought content normal.        Judgment: Judgment normal.        Assessment & Plan:     ICD-10-CM   1. Malignant neoplasm of upper-inner quadrant of right breast in female, estrogen receptor positive (HCC)  C50.211    Z17.0     2. Status post bilateral breast reconstruction  Z98.890     3. Acquired absence of bilateral breasts and nipples  Z90.13       Patient will let me know how she does on Monday with a lumpectomy by Dr. Carolynne [name] for the right breast.  I will plan to see her back shortly thereafter.

## 2022-08-15 ENCOUNTER — Ambulatory Visit
Admission: RE | Admit: 2022-08-15 | Discharge: 2022-08-15 | Disposition: A | Discharge status: Home / Self Care | Payer: BC Managed Care – PPO | Source: Ambulatory Visit | Attending: General Surgery | Admitting: General Surgery

## 2022-08-15 ENCOUNTER — Other Ambulatory Visit: Payer: Self-pay | Admitting: General Surgery

## 2022-08-15 DIAGNOSIS — C50211 Malignant neoplasm of upper-inner quadrant of right female breast: Secondary | ICD-10-CM | POA: Diagnosis not present

## 2022-08-15 DIAGNOSIS — Z170 Estrogen receptor positive status [ER+]: Secondary | ICD-10-CM

## 2022-08-15 HISTORY — PX: BREAST BIOPSY: SHX20

## 2022-08-15 MED ORDER — CHLORHEXIDINE GLUCONATE CLOTH 2 % EX PADS: 6.0000 | MEDICATED_PAD | Freq: Once | CUTANEOUS | Status: DC

## 2022-08-15 NOTE — Progress Notes (Signed)

## 2022-08-18 ENCOUNTER — Encounter (HOSPITAL_BASED_OUTPATIENT_CLINIC_OR_DEPARTMENT_OTHER): Payer: Self-pay | Admitting: General Surgery

## 2022-08-18 ENCOUNTER — Ambulatory Visit (HOSPITAL_BASED_OUTPATIENT_CLINIC_OR_DEPARTMENT_OTHER): Payer: BC Managed Care – PPO | Admitting: Anesthesiology

## 2022-08-18 ENCOUNTER — Other Ambulatory Visit: Payer: Self-pay

## 2022-08-18 ENCOUNTER — Ambulatory Visit
Admission: RE | Admit: 2022-08-18 | Discharge: 2022-08-18 | Disposition: A | Discharge status: Home / Self Care | Payer: BC Managed Care – PPO | Source: Ambulatory Visit | Attending: General Surgery | Admitting: General Surgery

## 2022-08-18 ENCOUNTER — Ambulatory Visit (HOSPITAL_BASED_OUTPATIENT_CLINIC_OR_DEPARTMENT_OTHER)
Admission: RE | Admit: 2022-08-18 | Discharge: 2022-08-18 | Disposition: A | Discharge status: Home / Self Care | Payer: BC Managed Care – PPO | Attending: General Surgery | Admitting: General Surgery

## 2022-08-18 ENCOUNTER — Encounter (HOSPITAL_BASED_OUTPATIENT_CLINIC_OR_DEPARTMENT_OTHER)
Admission: RE | Disposition: A | Discharge status: Home / Self Care | Payer: Self-pay | Source: Home / Self Care | Attending: General Surgery

## 2022-08-18 DIAGNOSIS — I10 Essential (primary) hypertension: Secondary | ICD-10-CM | POA: Diagnosis not present

## 2022-08-18 DIAGNOSIS — Z170 Estrogen receptor positive status [ER+]: Secondary | ICD-10-CM | POA: Diagnosis not present

## 2022-08-18 DIAGNOSIS — N6031 Fibrosclerosis of right breast: Secondary | ICD-10-CM | POA: Diagnosis not present

## 2022-08-18 DIAGNOSIS — Z9013 Acquired absence of bilateral breasts and nipples: Secondary | ICD-10-CM | POA: Insufficient documentation

## 2022-08-18 DIAGNOSIS — C50911 Malignant neoplasm of unspecified site of right female breast: Secondary | ICD-10-CM | POA: Diagnosis not present

## 2022-08-18 DIAGNOSIS — C50211 Malignant neoplasm of upper-inner quadrant of right female breast: Secondary | ICD-10-CM | POA: Insufficient documentation

## 2022-08-18 HISTORY — PX: BREAST LUMPECTOMY WITH RADIOACTIVE SEED LOCALIZATION: SHX6424

## 2022-08-18 SURGERY — BREAST LUMPECTOMY WITH RADIOACTIVE SEED LOCALIZATION
Anesthesia: General | Site: Breast | Laterality: Right

## 2022-08-18 MED ORDER — ACETAMINOPHEN 500 MG PO TABS
1000.0000 mg | ORAL_TABLET | ORAL | Status: AC
  Administered 2022-08-18: 1000 mg via ORAL

## 2022-08-18 MED ORDER — OXYCODONE HCL 5 MG PO TABS: 5.0000 mg | ORAL_TABLET | Freq: Once | ORAL | Status: DC | PRN

## 2022-08-18 MED ORDER — LIDOCAINE HCL 1 % IJ SOLN
INTRAMUSCULAR | Status: DC | PRN
  Administered 2022-08-18: 60 mg via INTRADERMAL

## 2022-08-18 MED ORDER — ACETAMINOPHEN 500 MG PO TABS
ORAL_TABLET | ORAL | Status: AC
  Filled 2022-08-18: qty 2

## 2022-08-18 MED ORDER — ONDANSETRON HCL 4 MG/2ML IJ SOLN
INTRAMUSCULAR | Status: AC
  Filled 2022-08-18: qty 2

## 2022-08-18 MED ORDER — LIDOCAINE 2% (20 MG/ML) 5 ML SYRINGE
INTRAMUSCULAR | Status: AC
  Filled 2022-08-18: qty 5

## 2022-08-18 MED ORDER — DEXAMETHASONE SODIUM PHOSPHATE 10 MG/ML IJ SOLN
INTRAMUSCULAR | Status: AC
  Filled 2022-08-18: qty 1

## 2022-08-18 MED ORDER — BUPIVACAINE-EPINEPHRINE (PF) 0.25% -1:200000 IJ SOLN
INTRAMUSCULAR | Status: AC
  Filled 2022-08-18: qty 30

## 2022-08-18 MED ORDER — ONDANSETRON HCL 4 MG/2ML IJ SOLN
INTRAMUSCULAR | Status: DC | PRN
  Administered 2022-08-18: 4 mg via INTRAVENOUS

## 2022-08-18 MED ORDER — MIDAZOLAM HCL 2 MG/2ML IJ SOLN
INTRAMUSCULAR | Status: AC
  Filled 2022-08-18: qty 2

## 2022-08-18 MED ORDER — CEFAZOLIN SODIUM-DEXTROSE 2-4 GM/100ML-% IV SOLN
2.0000 g | INTRAVENOUS | Status: AC
  Administered 2022-08-18: 2 g via INTRAVENOUS

## 2022-08-18 MED ORDER — GABAPENTIN 300 MG PO CAPS
300.0000 mg | ORAL_CAPSULE | ORAL | Status: AC
  Administered 2022-08-18: 300 mg via ORAL

## 2022-08-18 MED ORDER — FENTANYL CITRATE (PF) 100 MCG/2ML IJ SOLN
INTRAMUSCULAR | Status: AC
  Filled 2022-08-18: qty 2

## 2022-08-18 MED ORDER — PHENYLEPHRINE HCL (PRESSORS) 10 MG/ML IV SOLN
INTRAVENOUS | Status: DC | PRN
  Administered 2022-08-18: 80 ug via INTRAVENOUS
  Administered 2022-08-18: 160 ug via INTRAVENOUS
  Administered 2022-08-18: 80 ug via INTRAVENOUS

## 2022-08-18 MED ORDER — DEXAMETHASONE SODIUM PHOSPHATE 10 MG/ML IJ SOLN
INTRAMUSCULAR | Status: DC | PRN
  Administered 2022-08-18: 10 mg via INTRAVENOUS

## 2022-08-18 MED ORDER — CEFAZOLIN SODIUM-DEXTROSE 2-4 GM/100ML-% IV SOLN
INTRAVENOUS | Status: AC
  Filled 2022-08-18: qty 100

## 2022-08-18 MED ORDER — KETOROLAC TROMETHAMINE 30 MG/ML IJ SOLN: 30.0000 mg | Freq: Once | INTRAMUSCULAR | Status: DC | PRN

## 2022-08-18 MED ORDER — HYDROMORPHONE HCL 1 MG/ML IJ SOLN: 0.2500 mg | INTRAMUSCULAR | Status: DC | PRN

## 2022-08-18 MED ORDER — MIDAZOLAM HCL 5 MG/5ML IJ SOLN
INTRAMUSCULAR | Status: DC | PRN
  Administered 2022-08-18: 2 mg via INTRAVENOUS

## 2022-08-18 MED ORDER — OXYCODONE HCL 5 MG/5ML PO SOLN: 5.0000 mg | Freq: Once | ORAL | Status: DC | PRN

## 2022-08-18 MED ORDER — AMISULPRIDE (ANTIEMETIC) 5 MG/2ML IV SOLN: 10.0000 mg | Freq: Once | INTRAVENOUS | Status: DC | PRN

## 2022-08-18 MED ORDER — LACTATED RINGERS IV SOLN: INTRAVENOUS | Status: DC

## 2022-08-18 MED ORDER — OXYCODONE HCL 5 MG PO TABS: 5.0000 mg | ORAL_TABLET | Freq: Four times a day (QID) | ORAL | 0 refills | Status: DC | PRN

## 2022-08-18 MED ORDER — PHENYLEPHRINE 80 MCG/ML (10ML) SYRINGE FOR IV PUSH (FOR BLOOD PRESSURE SUPPORT)
PREFILLED_SYRINGE | INTRAVENOUS | Status: AC
  Filled 2022-08-18: qty 10

## 2022-08-18 MED ORDER — FENTANYL CITRATE (PF) 100 MCG/2ML IJ SOLN
INTRAMUSCULAR | Status: DC | PRN
  Administered 2022-08-18: 50 ug via INTRAVENOUS

## 2022-08-18 MED ORDER — ONDANSETRON HCL 4 MG/2ML IJ SOLN: 4.0000 mg | Freq: Once | INTRAMUSCULAR | Status: DC | PRN

## 2022-08-18 MED ORDER — PROPOFOL 10 MG/ML IV BOLUS
INTRAVENOUS | Status: AC
  Filled 2022-08-18: qty 20

## 2022-08-18 MED ORDER — BUPIVACAINE-EPINEPHRINE (PF) 0.25% -1:200000 IJ SOLN
INTRAMUSCULAR | Status: DC | PRN
  Administered 2022-08-18: 4 mL via PERINEURAL

## 2022-08-18 MED ORDER — GABAPENTIN 300 MG PO CAPS
ORAL_CAPSULE | ORAL | Status: AC
  Filled 2022-08-18: qty 1

## 2022-08-18 MED ORDER — PROPOFOL 10 MG/ML IV BOLUS
INTRAVENOUS | Status: DC | PRN
  Administered 2022-08-18: 200 mg via INTRAVENOUS

## 2022-08-18 SURGICAL SUPPLY — 39 items
ADH SKN CLS APL DERMABOND .7 (GAUZE/BANDAGES/DRESSINGS) ×1
APL PRP STRL LF DISP 70% ISPRP (MISCELLANEOUS) ×1
APPLIER CLIP 9.375 MED OPEN (MISCELLANEOUS)
APR CLP MED 9.3 20 MLT OPN (MISCELLANEOUS)
BLADE SURG 15 STRL LF DISP TIS (BLADE) ×2 IMPLANT
BLADE SURG 15 STRL SS (BLADE) ×1
CANISTER SUC SOCK COL 7IN (MISCELLANEOUS) ×2 IMPLANT
CANISTER SUCT 1200ML W/VALVE (MISCELLANEOUS) ×2 IMPLANT
CHLORAPREP W/TINT 26 (MISCELLANEOUS) ×2 IMPLANT
CLIP APPLIE 9.375 MED OPEN (MISCELLANEOUS) IMPLANT
COVER BACK TABLE 60X90IN (DRAPES) ×2 IMPLANT
COVER MAYO STAND STRL (DRAPES) ×2 IMPLANT
COVER PROBE CYLINDRICAL 5X96 (MISCELLANEOUS) ×2 IMPLANT
DERMABOND ADVANCED .7 DNX12 (GAUZE/BANDAGES/DRESSINGS) ×2 IMPLANT
DRAPE LAPAROSCOPIC ABDOMINAL (DRAPES) ×2 IMPLANT
DRAPE UTILITY XL STRL (DRAPES) ×2 IMPLANT
ELECT COATED BLADE 2.86 ST (ELECTRODE) ×2 IMPLANT
ELECT REM PT RETURN 9FT ADLT (ELECTROSURGICAL) ×1
ELECTRODE REM PT RTRN 9FT ADLT (ELECTROSURGICAL) ×2 IMPLANT
GLOVE BIO SURGEON STRL SZ7.5 (GLOVE) ×4 IMPLANT
GOWN STRL REUS W/ TWL LRG LVL3 (GOWN DISPOSABLE) ×4 IMPLANT
GOWN STRL REUS W/TWL LRG LVL3 (GOWN DISPOSABLE) ×2
KIT MARKER MARGIN INK (KITS) ×2 IMPLANT
NDL HYPO 25X1 1.5 SAFETY (NEEDLE) IMPLANT
NEEDLE HYPO 25X1 1.5 SAFETY (NEEDLE) IMPLANT
NS IRRIG 1000ML POUR BTL (IV SOLUTION) IMPLANT
PACK BASIN DAY SURGERY FS (CUSTOM PROCEDURE TRAY) ×2 IMPLANT
PENCIL SMOKE EVACUATOR (MISCELLANEOUS) ×2 IMPLANT
SLEEVE SCD COMPRESS KNEE MED (STOCKING) ×2 IMPLANT
SPIKE FLUID TRANSFER (MISCELLANEOUS) IMPLANT
SPONGE T-LAP 18X18 ~~LOC~~+RFID (SPONGE) ×2 IMPLANT
SUT MON AB 4-0 PC3 18 (SUTURE) ×2 IMPLANT
SUT SILK 2 0 SH (SUTURE) IMPLANT
SUT VICRYL 3-0 CR8 SH (SUTURE) ×2 IMPLANT
SYR CONTROL 10ML LL (SYRINGE) IMPLANT
TOWEL GREEN STERILE FF (TOWEL DISPOSABLE) ×2 IMPLANT
TRAY FAXITRON CT DISP (TRAY / TRAY PROCEDURE) ×2 IMPLANT
TUBE CONNECTING 20X1/4 (TUBING) ×2 IMPLANT
YANKAUER SUCT BULB TIP NO VENT (SUCTIONS) IMPLANT

## 2022-08-18 NOTE — Interval H&P Note (Signed)
History and Physical Interval Note:  08/18/2022 7:02 AM  Olivia [name]  has presented today for surgery, with the diagnosis of RIGHT BREAST RECURRENT CANCER.  The various methods of treatment have been discussed with the patient and family. After consideration of risks, benefits and other options for treatment, the patient has consented to  Procedure(s): RIGHT BREAST LUMPECTOMY WITH RADIOACTIVE SEED LOCALIZATION (Right) as a surgical intervention.  The patient's history has been reviewed, patient examined, no change in status, stable for surgery.  I have reviewed the patient's chart and labs.  Questions were answered to the patient's satisfaction.     Chevis Pretty III

## 2022-08-18 NOTE — Anesthesia Procedure Notes (Signed)
Procedure Name: LMA Insertion Date/Time: 08/18/2022 7:48 AM  Performed by: Thornell Mule, CRNAPre-anesthesia Checklist: Patient identified, Emergency Drugs available, Suction available and Patient being monitored Patient Re-evaluated:Patient Re-evaluated prior to induction Oxygen Delivery Method: Circle system utilized Preoxygenation: Pre-oxygenation with 100% oxygen Induction Type: IV induction LMA: LMA inserted LMA Size: 4.0 Number of attempts: 1 Placement Confirmation: positive ETCO2 Tube secured with: Tape Dental Injury: Teeth and Oropharynx as per pre-operative assessment

## 2022-08-18 NOTE — Anesthesia Preprocedure Evaluation (Addendum)
Anesthesia Evaluation  Patient identified by MRN, date of birth, ID band Patient awake    Reviewed: Allergy & Precautions, H&P , NPO status , Patient's Chart, lab work & pertinent test results  Airway Mallampati: I  TM Distance: >3 FB Neck ROM: Full    Dental  (+) Teeth Intact, Dental Advisory Given   Pulmonary neg pulmonary ROS   Pulmonary exam normal breath sounds clear to auscultation       Cardiovascular hypertension (135/87, no home meds), Normal cardiovascular exam Rhythm:Regular Rate:Normal  Echo 2017 - Left ventricle: Global longitudinal LV strain is normal at -19.6%    The cavity size was normal. Systolic function was normal. The    estimated ejection fraction was in the range of 55% to 60%. Wall    motion was normal; there were no regional wall motion    abnormalities.  - Aortic valve: Trileaflet; normal thickness, mildly calcified    leaflets.  - Right ventricle: The cavity size was mildly dilated. Wall    thickness was normal.   Stress test 2017 normal    Neuro/Psych negative neurological ROS  negative psych ROS   GI/Hepatic Neg liver ROS,GERD  Controlled,,  Endo/Other  Hypothyroidism    Renal/GU negative Renal ROS  negative genitourinary   Musculoskeletal  (+) Arthritis , Osteoarthritis,    Abdominal   Peds negative pediatric ROS (+)  Hematology negative hematology ROS (+)   Anesthesia Other Findings Recurrent R breast ca   Reproductive/Obstetrics negative OB ROS                             Anesthesia Physical Anesthesia Plan  ASA: 2  Anesthesia Plan: General   Post-op Pain Management: Tylenol PO (pre-op)* and Toradol IV (intra-op)*   Induction: Intravenous  PONV Risk Score and Plan: 3 and Ondansetron, Dexamethasone, Midazolam and Treatment may vary due to age or medical condition  Airway Management Planned: LMA  Additional Equipment: None  Intra-op Plan:    Post-operative Plan: Extubation in OR  Informed Consent: I have reviewed the patients History and Physical, chart, labs and discussed the procedure including the risks, benefits and alternatives for the proposed anesthesia with the patient or authorized representative who has indicated his/her understanding and acceptance.     Dental advisory given  Plan Discussed with: CRNA  Anesthesia Plan Comments:         Anesthesia Quick Evaluation

## 2022-08-18 NOTE — H&P (Signed)
REFERRING PHYSICIAN: Wayland Denis, DO PROVIDER: Lindell Noe, MD MRN: [id number] DOB: 03-27-1960 Subjective   Chief Complaint: New Consultation  History of Present Illness: Olivia [name] is a 62 y.o. female who is seen today as an office consultation for evaluation of New Consultation  We are asked to see the patient in consultation by Dr. Wayland Denis to evaluate her for a recurrent right breast cancer. The patient is a 62 year old white female who originally had a lumpectomy on the right breast with a complete axillary lymph node dissection for an invasive breast cancer back in 2003. In 2018 she developed a recurrence of the cancer in the right breast. At that time because she had been previously radiated she underwent right mastectomy with a prophylactic left mastectomy and reconstruction. She was doing well but recently felt a small pea-sized lump in the upper inner central right reconstructed breast. She brought this to the attention of her doctors and was evaluated with ultrasound. She was found to have a 5 mm mass in this location. This was biopsied and came back as a grade 2 invasive ductal cancer that was ER and PR positive and HER2 negative with a Ki-67 of 20%. The axilla showed no abnormal looking lymph nodes  Review of Systems: A complete review of systems was obtained from the patient. I have reviewed this information and discussed as appropriate with the patient. See HPI as well for other ROS.  ROS   Medical History: Past Medical History:  Diagnosis Date  Arthritis  History of cancer  Hyperlipidemia  Hypertension  Thyroid disease   Patient Active Problem List  Diagnosis  Malignant neoplasm of upper-inner quadrant of right breast in female, estrogen receptor positive (CMS/HHS-HCC)   Past Surgical History:  Procedure Laterality Date  MASTECTOMY 2018    No Known Allergies  Current Outpatient Medications on File Prior to Visit  Medication Sig Dispense Refill   atorvastatin (LIPITOR) 20 MG tablet Take 20 mg by mouth once daily  calcium carbonate (TUMS) 200 mg calcium (500 mg) chewable tablet Take by mouth  collagenase Clostridium hist. (COLLAGENASE CLOS HIST., BULK,) Powd Use  diclofenac (VOLTAREN) 75 MG EC tablet Take 75 mg by mouth once daily  multivitamin with minerals tablet Take 1 tablet by mouth  psyllium, with dextrose, (METAMUCIL) powder Take 1 packet by mouth once daily  vit C-zinc citrate-elderberry (ELDERBERRY IMMUNE HEALTH) 45-3.75-50 mg Chew Take by mouth   No current facility-administered medications on file prior to visit.   Family History  Problem Relation Age of Onset  Skin cancer Mother  High blood pressure (Hypertension) Mother  Hyperlipidemia (Elevated cholesterol) Mother  Coronary Artery Disease (Blocked arteries around heart) Mother  Diabetes Father  High blood pressure (Hypertension) Sister    Social History   Tobacco Use  Smoking Status Never  Smokeless Tobacco Never    Social History   Socioeconomic History  Marital status: Widowed  Tobacco Use  Smoking status: Never  Smokeless tobacco: Never  Substance and Sexual Activity  Alcohol use: Yes  Drug use: Never   Social Determinants of Health   Food Insecurity: No Food Insecurity (01/07/2022)  Received from [facility]  Hunger Vital Sign  Worried About Running Out of Food in the Last Year: Never true  Ran Out of Food in the Last Year: Never true  Transportation Needs: No Transportation Needs (01/07/2022)  Received from [facility] - Transportation  Lack of Transportation (Medical): No  Lack of Transportation (Non-Medical): No   Objective:  Vitals:  BP: (!) 150/92  Pulse: 65  Temp: 36.8 C (98.2 F)  SpO2: 98%  Weight: 63.5 kg (140 lb)  Height: 162.6 cm (5\' 4" )  PainSc: 0-No pain  PainLoc: Breast   Body mass index is 24.03 kg/m.  Physical Exam Vitals reviewed.  Constitutional:  General: She is not in acute  distress. Appearance: Normal appearance.  HENT:  Head: Normocephalic and atraumatic.  Right Ear: External ear normal.  Left Ear: External ear normal.  Nose: Nose normal.  Mouth/Throat:  Mouth: Mucous membranes are moist.  Pharynx: Oropharynx is clear.  Eyes:  General: No scleral icterus. Extraocular Movements: Extraocular movements intact.  Conjunctiva/sclera: Conjunctivae normal.  Pupils: Pupils are equal, round, and reactive to light.  Cardiovascular:  Rate and Rhythm: Normal rate and regular rhythm.  Pulses: Normal pulses.  Heart sounds: Normal heart sounds.  Pulmonary:  Effort: Pulmonary effort is normal. No respiratory distress.  Breath sounds: Normal breath sounds.  Abdominal:  General: Bowel sounds are normal.  Palpations: Abdomen is soft.  Tenderness: There is no abdominal tenderness.  Musculoskeletal:  General: No swelling, tenderness or deformity. Normal range of motion.  Cervical back: Normal range of motion and neck supple.  Skin: General: Skin is warm and dry.  Coloration: Skin is not jaundiced.  Neurological:  General: No focal deficit present.  Mental Status: She is alert and oriented to person, place, and time.  Psychiatric:  Mood and Affect: Mood normal.  Behavior: Behavior normal.     Breast: There is no palpable mass in either reconstructed breast. There is no palpable axillary, supraclavicular, or cervical lymphadenopathy.  Labs, Imaging and Diagnostic Testing:  Assessment and Plan:   Diagnoses and all orders for this visit:  Malignant neoplasm of upper-inner quadrant of right breast in female, estrogen receptor positive (CMS/HHS-HCC)   The patient appears to have a 5 mm recurrence in the upper inner central right breast. At this point I would recommend excision of this area. She will meet with medical oncology in the next few days and will get staging studies. This will help determine what she will need adjuvantly. I have discussed with her in  detail the risks and benefits of the operation as well as some of the technical aspects including use of a radioactive seed for localization and she understands and wishes to proceed. We will move forward with surgical scheduling

## 2022-08-18 NOTE — Anesthesia Postprocedure Evaluation (Signed)
Anesthesia Post Note  Patient: Olivia [name]  Procedure(s) Performed: RIGHT BREAST LUMPECTOMY WITH RADIOACTIVE SEED LOCALIZATION (Right: Breast)     Patient location during evaluation: PACU Anesthesia Type: General Level of consciousness: awake and alert, oriented and patient cooperative Pain management: pain level controlled Vital Signs Assessment: post-procedure vital signs reviewed and stable Respiratory status: spontaneous breathing, nonlabored ventilation and respiratory function stable Cardiovascular status: blood pressure returned to baseline and stable Postop Assessment: no apparent nausea or vomiting Anesthetic complications: no   No notable events documented.  Last Vitals:  Vitals:   08/18/22 0900 08/18/22 0926  BP: 107/68 133/76  Pulse: 64 65  Resp: 12 16  Temp:  (!) 36.2 C  SpO2: 96% 98%    Last Pain:  Vitals:   08/18/22 0926  TempSrc:   PainSc: 0-No pain                 Lannie Fields

## 2022-08-18 NOTE — Op Note (Signed)
08/18/2022  8:24 AM  PATIENT:  Olivia [name]  62 y.o. female  PRE-OPERATIVE DIAGNOSIS:  RIGHT BREAST RECURRENT CANCER  POST-OPERATIVE DIAGNOSIS:  RIGHT BREAST RECURRENT CANCER  PROCEDURE:  Procedure(s): RIGHT BREAST LUMPECTOMY WITH RADIOACTIVE SEED LOCALIZATION (Right)  SURGEON:  Surgeons and Role:    * Griselda Miner, MD - Primary  PHYSICIAN ASSISTANT:   ASSISTANTS: none   ANESTHESIA:   local and general  EBL:  5 mL   BLOOD ADMINISTERED:none  DRAINS: none   LOCAL MEDICATIONS USED:  MARCAINE     SPECIMEN:  Source of Specimen:  right breast tissue with additional lateral margin  DISPOSITION OF SPECIMEN:  PATHOLOGY  COUNTS:  YES  TOURNIQUET:  * No tourniquets in log *  DICTATION: .Dragon Dictation  After informed consent was obtained the patient was brought to the operating room and placed in the supine position on the operating table.  After adequate induction of general anesthesia the patient's right breast was prepped with ChloraPrep, allowed to dry, and draped in usual sterile manner.  An appropriate timeout was performed.  Previously an I-125 seed was placed in the upper inner subareolar right breast to mark an area of recurrent breast cancer.  The neoprobe was set to I-125 in the area of radioactivity was readily identified.  There was not a lot of tissue between the skin and the chest wall muscle.  I made a crescent shaped incision at the edge of the areola where the skin would overlie the area of the radioactive seed.  The incision was carried through the skin and subcutaneous tissue sharply with the electrocautery.  Dissection was carried down to the chest wall and the tissue was removed from the chest wall muscle.  Once this was accomplished then the specimen was removed from the patient.  It was oriented with the appropriate paint colors.  A specimen radiograph was obtained that showed the clip and seed to be within the specimen.  The small amount of subcutaneous  fatty tissue that was present seem to normal.  I did elect to take an additional lateral margin based off the specimen radiograph.  This was also marked appropriately.  The tissue was then all sent to pathology for further evaluation.  Hemostasis was achieved using the Bovie electrocautery.  The area was irrigated with saline and infiltrated with quarter percent Marcaine.  The incision was then closed with interrupted 4-0 Monocryl subcuticular stitches.  Dermabond dressings were applied.  The patient tolerated the procedure well.  At the end of the case all needle sponge and instrument counts were correct.  The patient was then awakened and taken to recovery in stable condition.  PLAN OF CARE: Discharge to home after PACU  PATIENT DISPOSITION:  PACU - hemodynamically stable.   Delay start of Pharmacological VTE agent (>24hrs) due to surgical blood loss or risk of bleeding: not applicable

## 2022-08-18 NOTE — Discharge Instructions (Signed)
You may have Tylenol again after 12:45pm today, if needed.   Post Anesthesia Home Care Instructions  Activity: Get plenty of rest for the remainder of the day. A responsible individual must stay with you for 24 hours following the procedure.  For the next 24 hours, DO NOT: -Drive a car -Advertising copywriter -Drink alcoholic beverages -Take any medication unless instructed by your physician -Make any legal decisions or sign important papers.  Meals: Start with liquid foods such as gelatin or soup. Progress to regular foods as tolerated. Avoid greasy, spicy, heavy foods. If nausea and/or vomiting occur, drink only clear liquids until the nausea and/or vomiting subsides. Call your physician if vomiting continues.  Special Instructions/Symptoms: Your throat may feel dry or sore from the anesthesia or the breathing tube placed in your throat during surgery. If this causes discomfort, gargle with warm salt water. The discomfort should disappear within 24 hours.  If you had a scopolamine patch placed behind your ear for the management of post- operative nausea and/or vomiting:  1. The medication in the patch is effective for 72 hours, after which it should be removed.  Wrap patch in a tissue and discard in the trash. Wash hands thoroughly with soap and water. 2. You may remove the patch earlier than 72 hours if you experience unpleasant side effects which may include dry mouth, dizziness or visual disturbances. 3. Avoid touching the patch. Wash your hands with soap and water after contact with the patch.

## 2022-08-18 NOTE — Transfer of Care (Signed)
Immediate Anesthesia Transfer of Care Note  Patient: Olivia [name]  Procedure(s) Performed: RIGHT BREAST LUMPECTOMY WITH RADIOACTIVE SEED LOCALIZATION (Right: Breast)  Patient Location: PACU  Anesthesia Type:General  Level of Consciousness: drowsy and patient cooperative  Airway & Oxygen Therapy: Patient Spontanous Breathing and Patient connected to face mask oxygen  Post-op Assessment: Report given to RN and Post -op Vital signs reviewed and stable  Post vital signs: Reviewed and stable  Last Vitals:  Vitals Value Taken Time  BP 95/62 08/18/22 0833  Temp 36.2 C 08/18/22 0833  Pulse 67 08/18/22 0835  Resp 11 08/18/22 0835  SpO2 99 % 08/18/22 0835  Vitals shown include unfiled device data.  Last Pain:  Vitals:   08/18/22 0645  TempSrc: Oral  PainSc: 0-No pain         Complications: No notable events documented.

## 2022-08-19 ENCOUNTER — Encounter (HOSPITAL_BASED_OUTPATIENT_CLINIC_OR_DEPARTMENT_OTHER): Payer: Self-pay | Admitting: General Surgery

## 2022-08-19 LAB — SURGICAL PATHOLOGY

## 2022-08-25 ENCOUNTER — Encounter: Payer: Self-pay | Admitting: *Deleted

## 2022-08-25 DIAGNOSIS — Z01419 Encounter for gynecological examination (general) (routine) without abnormal findings: Secondary | ICD-10-CM | POA: Diagnosis not present

## 2022-08-25 DIAGNOSIS — Z6825 Body mass index (BMI) 25.0-25.9, adult: Secondary | ICD-10-CM | POA: Diagnosis not present

## 2022-09-01 NOTE — Assessment & Plan Note (Addendum)
invasive ductal carcinoma, pT2N0, M0), Stage 1B, ER/PR: positive, HER2 negative, Grade 3, Oncotype RS 21, recurrence in 07/2022 -diagnosed 10/2016, s/p bilateral mastectomy and reconstruction. Based on oncotype RS she did not need adjuvant chemotherapy. And given node-negative mastectomy she did not require adjuvant radiation -started adjuvant letrozole 12/2016. She was changed to exemestane 11/2019 due to arthralgia.  Planned for a total of 7 years but stopped 12/2021 due to worsening bone density.  -Recent US/bx showed recurrent vs new R breast IDC, G2, ER/PR strongly positive, HER2 negative, with Ki67 20%.  Staging CT and bone scan showed no definitive evidence of metastasis. -She underwent right lumpectomy on August 18, 2022, no residual malignant cell was seen on surgical path.  Her initial biopsy showed 1.5 mm invasive cancer, the cancer was removed completely by biopsy. -Given the small size of invasive cancer, no benefit of adjuvant chemotherapy. -She previously received a total of 5 years of AI.  She stopped in November 2023 due to osteoprosis -I recommended 5 years of tamoxifen, benefit and potential side effects discussed with her, she is interested.

## 2022-09-01 NOTE — Assessment & Plan Note (Signed)
initially on fosamax x5 years, then on prolia q6 months 03/2015 - 11/2020 -Repeat DEXA 11/2021 showed worsening T score -2.4 of RFN -She fell 11/2021 and fractured C7 -She began Zometa (q6 month) on 12/2021; s/p 2 doses, will continue for 2 more doses

## 2022-09-01 NOTE — Assessment & Plan Note (Signed)
-  Breast/GYN panel (23 genes) was negative

## 2022-09-02 ENCOUNTER — Encounter: Payer: Self-pay | Admitting: Hematology

## 2022-09-02 ENCOUNTER — Encounter: Payer: Self-pay | Admitting: Nurse Practitioner

## 2022-09-02 ENCOUNTER — Other Ambulatory Visit: Payer: Self-pay

## 2022-09-02 ENCOUNTER — Inpatient Hospital Stay (HOSPITAL_BASED_OUTPATIENT_CLINIC_OR_DEPARTMENT_OTHER): Payer: BC Managed Care – PPO | Admitting: Hematology

## 2022-09-02 VITALS — BP 134/97 | HR 64 | Temp 98.5°F | Resp 18 | Ht 64.0 in | Wt 143.5 lb

## 2022-09-02 DIAGNOSIS — Z1379 Encounter for other screening for genetic and chromosomal anomalies: Secondary | ICD-10-CM

## 2022-09-02 DIAGNOSIS — M810 Age-related osteoporosis without current pathological fracture: Secondary | ICD-10-CM | POA: Diagnosis not present

## 2022-09-02 DIAGNOSIS — Z9013 Acquired absence of bilateral breasts and nipples: Secondary | ICD-10-CM | POA: Diagnosis not present

## 2022-09-02 DIAGNOSIS — Z170 Estrogen receptor positive status [ER+]: Secondary | ICD-10-CM

## 2022-09-02 DIAGNOSIS — Z79810 Long term (current) use of selective estrogen receptor modulators (SERMs): Secondary | ICD-10-CM | POA: Diagnosis not present

## 2022-09-02 DIAGNOSIS — C50211 Malignant neoplasm of upper-inner quadrant of right female breast: Secondary | ICD-10-CM | POA: Diagnosis not present

## 2022-09-02 MED ORDER — TAMOXIFEN CITRATE 20 MG PO TABS: 20.0000 mg | ORAL_TABLET | Freq: Every day | ORAL | 3 refills | Status: DC

## 2022-09-02 NOTE — Progress Notes (Signed)
[facility]   [phone] Fax:([phone]   Clinic Follow up Note   Patient Care Team: Natalia Leatherwood, DO as PCP - General (Family Medicine) Malachy Mood, MD as Consulting Physician (Hematology) Griselda Miner, MD as Consulting Physician (General Surgery) Antony Blackbird, MD as Consulting Physician (Radiation Oncology) Mardella Layman, MD as Consulting Physician (Gastroenterology) Richarda Overlie, MD as Consulting Physician (Obstetrics and Gynecology) Axel Filler, Larna Daughters, NP as Nurse Practitioner (Hematology and Oncology) Reather Littler, MD as Consulting Physician (Endocrinology)  Date of Service:  09/02/2022  CHIEF COMPLAINT: f/u of  right breast cancer  CURRENT THERAPY:  Zometa q12 weeks Tamoxifen starting 09/2022   ASSESSMENT:  Olivia [name] is a 62 y.o. female with   Malignant neoplasm of upper-inner quadrant of right breast in female, estrogen receptor positive (HCC) invasive ductal carcinoma, pT2N0, M0), Stage 1B, ER/PR: positive, HER2 negative, Grade 3, Oncotype RS 21, recurrence in 07/2022 -diagnosed 10/2016, s/p bilateral mastectomy and reconstruction. Based on oncotype RS she did not need adjuvant chemotherapy. And given node-negative mastectomy she did not require adjuvant radiation -started adjuvant letrozole 12/2016. She was changed to exemestane 11/2019 due to arthralgia.  Planned for a total of 7 years but stopped 12/2021 due to worsening bone density.  -Recent US/bx showed recurrent vs new R breast IDC, G2, ER/PR strongly positive, HER2 negative, with Ki67 20%.  Staging CT and bone scan showed no definitive evidence of metastasis. -She underwent right lumpectomy on August 18, 2022, no residual malignant cell was seen on surgical path.  Her initial biopsy showed 1.5 mm invasive cancer, the cancer was removed completely by biopsy. -Given the small size of invasive cancer, no benefit of adjuvant chemotherapy. -She previously received a total  of 5 years of AI.  She stopped in November 2023 due to osteoprosis -I recommended 5 years of tamoxifen, benefit and potential side effects discussed with her, she is interested.  Osteoporosis initially on fosamax x5 years, then on prolia q6 months 03/2015 - 11/2020 -Repeat DEXA 11/2021 showed worsening T score -2.4 of RFN -She fell 11/2021 and fractured C7 -She began Zometa (q6 month) on 12/2021; s/p 2 doses, will continue for 2 more doses     Genetic testing -Breast/GYN panel (23 genes) was negative    PLAN: -I recommend adjuvant tamoxifen for 5 years, she will start in 2-4 weeks -lab and Zometa in 1 month -f/u phone visit in 3 months - I prescribe Tamoxifen -will check with rad/onc to see if she needs adjuvant RT give her recurrence     SUMMARY OF ONCOLOGIC HISTORY: Oncology History Overview Note  Cancer Staging Malignant neoplasm of upper-inner quadrant of right breast in female, estrogen receptor positive (HCC) Staging form: Breast, AJCC 8th Edition - Clinical stage from 10/09/2016: Stage IA (cT1c, cN0, cM0, G3, ER: Positive, PR: Positive, HER2: Negative) - Signed by Malachy Mood, MD on 10/14/2016 - Pathologic stage from 11/17/2016: Stage IB (pT2, pN0, cM0, G3, ER: Positive, PR: Positive, HER2: Negative, Oncotype DX score: 21) - Signed by Malachy Mood, MD on 12/11/2016     Malignant neoplasm of upper-inner quadrant of right breast in female, estrogen receptor positive (HCC)  10/08/2016 Mammogram   Diagnostic mammogram and ultrasound showed an irregular mass in the right breast at 1 o'clock, 5 cm from the nipple measuring 1.6 x 1.4x 1.6 cm, axilla was negative for adenopathy.   10/09/2016 Receptors her2   Estrogen Receptor: 80%, POSITIVE, STRONG STAINING INTENSITY Progesterone Receptor: 30%, POSITIVE, STRONG STAINING  INTENSITY Proliferation Marker Ki67: 35% HER2 NEGATIVE    10/09/2016 Initial Diagnosis   Malignant neoplasm of upper-inner quadrant of right breast in female, estrogen  receptor positive (HCC)   10/09/2016 Initial Biopsy   Diagnosis Breast, right, needle core biopsy, upper inner quadrant, 1:00 o'clock - INVASIVE DUCTAL CARCINOMA, G3   10/2016 Genetic Testing   Genetic testing was negative for The genes analyzed were the 23 genes on [facility]'s Breast/GYN panel (ATM, BARD1, BRCA1, BRCA2, BRIP1, CDH1, CHEK2, DICER1, EPCAM, MLH1,  MSH2, MSH6, NBN, NF1, PALB2, PMS2, PTEN, RAD50, RAD51C, RAD51D,SMARCA4, STK11, and TP53).     11/17/2016 Surgery   RIGHT MASTECTOMY AND LEFT PROPHYLATIC MASTECTOMY by Dr. Carolynne Edouard  IMEDITATE BREAST RECONSTRUCTION WITH PLACEMENT OF TISSUE EXPANDER AND FLEX HD (ACELLULAR HYDRATED DERMIS) by Dr. Ulice Bold     11/17/2016 Pathology Results   Diagnosis 11/17/16 1. Breast, simple mastectomy, Left - BENIGN BREAST TISSUE WITH ECTATIC DUCTS. - ONE OF ONE LYMPH NODE NEGATIVE FOR CARCINOMA (0/1). 2. Breast, simple mastectomy, Right - INVASIVE DUCTAL CARCINOMA, GRADE 3, SPANNING 2.4 CM. - HIGH GRADE DUCTAL CARCINOMA IN SITU. - INVASIVE CARCINOMA IS <0.1 CM OF THE POSTERIOR MARGIN (BROADLY) AND ANTERIOR MARGIN (FOCALLY). - IN SITU CARCINOMA IS >0.2 CM OF BOTH MARGINS. - PRIOR LUMPECTOMY SITE, NEGATIVE FOR CARCINOMA. - SEE ONCOLOGY TABLE. 3. Breast, excision, Superior flap deep margin - BENIGN FIBROADIPOSE TISSUE.   11/17/2016 Miscellaneous   Oncotype on 11/17/16 Rcurence Risk Score of 21 10-Year risk of distant reucrrence with Tamoxifen alone is 13%    12/18/2016 - 12/30/2021 Anti-estrogen oral therapy   Adjuvant Letrozole 2.5 mg daily; began 12/18/16. Due to arthritis I switched her to Exemestane 25mg  daily in 11/2019.    12/24/2016 Imaging   BONE SCAN IMPRESSION: No evidence of osseous metastatic disease.   12/24/2016 Imaging   CT CAP IMPRESSION: 1. Status post bilateral modified radical mastectomy and breast reconstruction, as above. No findings to suggest metastatic disease in the chest, abdomen or pelvis. 2. Multiple  heterogeneous appearing thyroid nodules, as discussed above. These are nonspecific but further evaluation with nonemergent thyroid ultrasound is recommended in the near future to determine any potential need for further evaluation with fine-needle aspiration. 3. Incidental findings, as above.   02/11/2017 Surgery   REMOVAL OF BILATERAL TISSUE EXPANDERS WITH PLACEMENT OF BILATERAL SILICONE BREAST IMPLANTS by Dr. Ulice Bold    06/19/2022 Imaging   R AX [place]:  Targeted ultrasound is performed, showing a 5 x 2 x 2 mm irregular hypoechoic mass right breast 1 o'clock position 3 cm from nipple at the site of palpable concern. No right axillary adenopathy.   IMPRESSION: Indeterminate palpable mass right breast 1 o'clock position.   07/01/2022 Pathology Results   Recurrence/new   Breast, right, needle core biopsy, 1 o'clock, 3cmfn, hydromark clip INVASIVE DUCTAL CARCINOMA, RECURRENT OVERALL GRADE: 2  PROGNOSTIC INDICATORS The tumor cells are EQUIVOCAL for Her2 (2+).  Estrogen Receptor: 100%, POSITIVE, STRONG STAINING INTENSITY Progesterone Receptor: 100%, POSITIVE, STRONG STAINING INTENSITY Proliferation Marker Ki67: 20%  1A. Breast, righnt,needle core biopsy, 1 o'clock, 3 cmfn FLUORESCENCE IN-SITU HYBRIDIZATION Results: GROUP 5: HER2 **NEGATIVE**        INTERVAL HISTORY:  MUNNI CONLEY is here for a follow up of right breast cancer. She was last seen by NP Lacie on  She presents to the clinic alone. Pt state that her breast surgery went well.     All other systems were reviewed with the patient and are negative.  MEDICAL HISTORY:  Past Medical  History:  Diagnosis Date   Adjustment insomnia 02/23/2019   Breast cancer (HCC) 2018   Lumpectomy, chemotherapy and radiation with recurrence 2018. Now on Femara   Cervical radiculopathy 07/05/2019   COVID-19 02/2019   Her husband also passed away at this time from COVID-19.   Family history of breast cancer    Genetic testing  10/30/2016   Breast/GYN panel (23 genes) @ [facility] - No pathogenic mutations detected   GERD (gastroesophageal reflux disease)    OTC medication   Grief reaction 02/23/2019   Heart murmur    as a child - Echo in early 20s "ok"   History of echocardiogram 05/2015   Echo 4/17: EF 55-60%, GLS -19.6%, normal wall motion   History of exercise stress test 05/2015   ETT 4/17 - No ST changes   HLD (hyperlipidemia)    Hypothyroidism    Multiple thyroid nodules 2009   Multiple nodules: Normal ultrasound reported at diagnosis   Osteoporosis    Personal history of chemotherapy    Personal history of radiation therapy    UNEQUAL LEG LENGTH, ACQUIRED 11/17/2006   Qualifier: Diagnosis of  By: Darrick Penna MD, KARL      SURGICAL HISTORY: Past Surgical History:  Procedure Laterality Date   ABDOMINAL HYSTERECTOMY     BREAST BIOPSY Right 07/01/2022   [place] RT BREAST BX W LOC DEV 1ST LESION IMG BX SPEC US GUIDE 07/01/2022 [facility]   BREAST BIOPSY  08/15/2022   [place] RT RADIOACTIVE SEED LOC 08/15/2022 [facility]   BREAST LUMPECTOMY Right 2003   BREAST LUMPECTOMY WITH RADIOACTIVE SEED LOCALIZATION Right 08/18/2022   Procedure: RIGHT BREAST LUMPECTOMY WITH RADIOACTIVE SEED LOCALIZATION;  Surgeon: Griselda Miner, MD;  Location: [facility];  Service: General;  Laterality: Right;   BREAST RECONSTRUCTION WITH PLACEMENT OF TISSUE EXPANDER AND FLEX HD (ACELLULAR HYDRATED DERMIS) Bilateral 11/17/2016   Procedure: IMEDITATE BREAST RECONSTRUCTION WITH PLACEMENT OF TISSUE EXPANDER AND FLEX HD (ACELLULAR HYDRATED DERMIS);  Surgeon: Peggye Form, DO;  Location: [facility];  Service: Plastics;  Laterality: Bilateral;   BREAST SURGERY  2003 /right   lumpectomy; chemotherapy and radiation   LIPOSUCTION WITH LIPOFILLING Bilateral 02/23/2020   Procedure: Fat filling of breasts for symmetry from abdomen;  Surgeon: Peggye Form, DO;  Location: [facility];  Service: Plastics;   Laterality: Bilateral;  90 min, please   LIPOSUCTION WITH LIPOFILLING Bilateral 05/16/2021   Procedure: LIPOSUCTION WITH LIPOFILLING;  Surgeon: Peggye Form, DO;  Location: [facility];  Service: Plastics;  Laterality: Bilateral;  1.5 hours   REMOVAL OF BILATERAL TISSUE EXPANDERS WITH PLACEMENT OF BILATERAL BREAST IMPLANTS Bilateral 02/11/2017   Procedure: REMOVAL OF BILATERAL TISSUE EXPANDERS WITH PLACEMENT OF BILATERAL SILICONE BREAST IMPLANTS;  Surgeon: Peggye Form, DO;  Location: [facility];  Service: Plastics;  Laterality: Bilateral;   TOTAL ABDOMINAL HYSTERECTOMY W/ BILATERAL SALPINGOOPHORECTOMY     for treatment of breast treatment   TOTAL MASTECTOMY Bilateral 11/17/2016   Procedure: RIGHT MASTECTOMY AND LEFT PROPHYLATIC MASTECTOMY;  Surgeon: Griselda Miner, MD;  Location: [facility];  Service: General;  Laterality: Bilateral;    I have reviewed the social history and family history with the patient and they are unchanged from previous note.  ALLERGIES:  has No Known Allergies.  MEDICATIONS:  Current Outpatient Medications  Medication Sig Dispense Refill   tamoxifen (NOLVADEX) 20 MG tablet Take 1 tablet (20 mg total) by mouth daily. 30 tablet 3  atorvastatin (LIPITOR) 20 MG tablet Take 1 tablet (20 mg total) by mouth daily. 90 tablet 3   Calcium 500 MG CHEW Chew by mouth.     Collagenase POWD by Does not apply route.     diclofenac (VOLTAREN) 75 MG EC tablet Take 1 tablet (75 mg total) by mouth daily. 90 tablet 3   ELDERBERRY PO Take by mouth.     oxyCODONE (ROXICODONE) 5 MG immediate release tablet Take 1 tablet (5 mg total) by mouth every 6 (six) hours as needed for severe pain. 10 tablet 0   psyllium (METAMUCIL) 58.6 % powder Take 1 packet by mouth daily.     Vitamin D, Cholecalciferol, 25 MCG (1000 UT) CAPS Take 1 capsule by mouth daily.     No current facility-administered medications for this visit.    PHYSICAL EXAMINATION: ECOG  PERFORMANCE STATUS: 1 - Symptomatic but completely ambulatory  Vitals:   09/02/22 1155  BP: (!) 134/97  Pulse: 64  Resp: 18  Temp: 98.5 F (36.9 C)  SpO2: 100%   Wt Readings from Last 3 Encounters:  09/02/22 143 lb 8 oz (65.1 kg)  08/18/22 143 lb 4.8 oz (65 kg)  08/12/22 142 lb 6.4 oz (64.6 kg)     GENERAL:alert, no distress and comfortable SKIN: skin color normal, no rashes or significant lesions EYES: normal, Conjunctiva are pink and non-injected, sclera clear  NEURO: alert & oriented x 3 with fluent speech  LABORATORY DATA:  I have reviewed the data as listed    Latest Ref Rng & Units 07/10/2022   11:16 AM 05/26/2022    9:02 AM 12/30/2021    8:22 AM  CBC  WBC 4.0 - 10.5 K/uL 6.6  4.0  4.2   Hemoglobin 12.0 - 15.0 g/dL 40.3  47.4  25.9   Hematocrit 36.0 - 46.0 % 44.8  43.7  41.1   Platelets 150 - 400 K/uL 279  258.0  222         Latest Ref Rng & Units 07/10/2022   11:16 AM 05/26/2022    9:02 AM 12/30/2021    8:22 AM  CMP  Glucose 70 - 99 mg/dL 97  93  563   BUN 8 - 23 mg/dL 26  17  18    Creatinine 0.44 - 1.00 mg/dL 8.75  6.43  3.29   Sodium 135 - 145 mmol/L 141  141  141   Potassium 3.5 - 5.1 mmol/L 3.9  4.1  4.0   Chloride 98 - 111 mmol/L 103  104  104   CO2 22 - 32 mmol/L 30  28  30    Calcium 8.9 - 10.3 mg/dL 51.8  9.3  84.1   Total Protein 6.5 - 8.1 g/dL 7.5  6.7  7.2   Total Bilirubin 0.3 - 1.2 mg/dL 0.7  0.8  0.5   Alkaline Phos 38 - 126 U/L 77  70  108   AST 15 - 41 U/L 36  23  19   ALT 0 - 44 U/L 36  25  18       RADIOGRAPHIC STUDIES: I have personally reviewed the radiological images as listed and agreed with the findings in the report. No results found.    No orders of the defined types were placed in this encounter.  All questions were answered. The patient knows to call the clinic with any problems, questions or concerns. No barriers to learning was detected. The total time spent in the appointment was 25 minutes.  Malachy Mood,  MD 09/02/2022   Carolin Coy, CMA, am acting as scribe for Malachy Mood, MD.   I have reviewed the above documentation for accuracy and completeness, and I agree with the above.

## 2022-09-03 ENCOUNTER — Telehealth: Payer: Self-pay | Admitting: *Deleted

## 2022-09-03 ENCOUNTER — Telehealth: Payer: Self-pay | Admitting: Hematology

## 2022-09-03 ENCOUNTER — Other Ambulatory Visit: Payer: Self-pay

## 2022-09-03 DIAGNOSIS — Z170 Estrogen receptor positive status [ER+]: Secondary | ICD-10-CM

## 2022-09-03 NOTE — Telephone Encounter (Signed)
Called pt to discuss Dr. Basilio Cairo recommendation for chest wall xrt. Pt agree to consult. Physician team notified. Referral placed.

## 2022-09-04 NOTE — Progress Notes (Signed)
Location of Breast Cancer: Malignant neoplasm of upper-inner quadrant of right breast in female, estrogen receptor positive   Histology per Pathology Report:   08-18-22 FINAL MICROSCOPIC DIAGNOSIS:   A. BREAST, RIGHT, LUMPECTOMY:  - Skin with underlying benign fibroadipose tissue  - No evidence of malignancy   B. BREAST, RIGHT LATERAL MARGIN, EXCISION:  - Benign skeletal muscle and fibroadipose tissue  - No evidence of malignancy   Receptor Status: ER(100%), PR (100%), Her2-neu (neg), Ki-67(20%)  Did patient present with symptoms (if so, please note symptoms) or was this found on screening mammography?: screening mammogram  Past/Anticipated interventions by surgeon, if any: 08-18-22 PROCEDURE:  Procedure(s): RIGHT BREAST LUMPECTOMY WITH RADIOACTIVE SEED LOCALIZATION (Right)   SURGEON:  Surgeons and Role:    Griselda Miner, MD - Primary  Past/Anticipated interventions by medical oncology, if any:  Malachy Mood, MD 09/02/2022  Oncology History Overview Note   Cancer Staging Malignant neoplasm of upper-inner quadrant of right breast in female, estrogen receptor positive (HCC) Staging form: Breast, AJCC 8th Edition - Clinical stage from 10/09/2016: Stage IA (cT1c, cN0, cM0, G3, ER: Positive, PR: Positive, HER2: Negative) - Signed by Malachy Mood, MD on 10/14/2016 - Pathologic stage from 11/17/2016: Stage IB (pT2, pN0, cM0, G3, ER: Positive, PR: Positive, HER2: Negative, Oncotype DX score: 21) - Signed by Malachy Mood, MD on 12/11/2016        Malignant neoplasm of upper-inner quadrant of right breast in female, estrogen receptor positive (HCC)   10/08/2016 Mammogram     Diagnostic mammogram and ultrasound showed an irregular mass in the right breast at 1 o'clock, 5 cm from the nipple measuring 1.6 x 1.4x 1.6 cm, axilla was negative for adenopathy.     10/09/2016 Receptors her2     Estrogen Receptor: 80%, POSITIVE, STRONG STAINING INTENSITY Progesterone Receptor: 30%, POSITIVE, STRONG STAINING  INTENSITY Proliferation Marker Ki67: 35% HER2 NEGATIVE      10/09/2016 Initial Diagnosis     Malignant neoplasm of upper-inner quadrant of right breast in female, estrogen receptor positive (HCC)     10/09/2016 Initial Biopsy     Diagnosis Breast, right, needle core biopsy, upper inner quadrant, 1:00 o'clock - INVASIVE DUCTAL CARCINOMA, G3     10/2016 Genetic Testing     Genetic testing was negative for The genes analyzed were the 23 genes on [facility]'s Breast/GYN panel (ATM, BARD1, BRCA1, BRCA2, BRIP1, CDH1, CHEK2, DICER1, EPCAM, MLH1,  MSH2, MSH6, NBN, NF1, PALB2, PMS2, PTEN, RAD50, RAD51C, RAD51D,SMARCA4, STK11, and TP53).         11/17/2016 Surgery     RIGHT MASTECTOMY AND LEFT PROPHYLATIC MASTECTOMY by Dr. Carolynne Edouard   IMEDITATE BREAST RECONSTRUCTION WITH PLACEMENT OF TISSUE EXPANDER AND FLEX HD (ACELLULAR HYDRATED DERMIS) by Dr. Ulice Bold        11/17/2016 Pathology Results     Diagnosis 11/17/16 1. Breast, simple mastectomy, Left - BENIGN BREAST TISSUE WITH ECTATIC DUCTS. - ONE OF ONE LYMPH NODE NEGATIVE FOR CARCINOMA (0/1). 2. Breast, simple mastectomy, Right - INVASIVE DUCTAL CARCINOMA, GRADE 3, SPANNING 2.4 CM. - HIGH GRADE DUCTAL CARCINOMA IN SITU. - INVASIVE CARCINOMA IS <0.1 CM OF THE POSTERIOR MARGIN (BROADLY) AND ANTERIOR MARGIN (FOCALLY). - IN SITU CARCINOMA IS >0.2 CM OF BOTH MARGINS. - PRIOR LUMPECTOMY SITE, NEGATIVE FOR CARCINOMA. - SEE ONCOLOGY TABLE. 3. Breast, excision, Superior flap deep margin - BENIGN FIBROADIPOSE TISSUE.     11/17/2016 Miscellaneous     Oncotype on 11/17/16 Rcurence Risk Score of 21 10-Year risk of distant reucrrence  with Tamoxifen alone is 13%       12/18/2016 - 12/30/2021 Anti-estrogen oral therapy     Adjuvant Letrozole 2.5 mg daily; began 12/18/16. Due to arthritis I switched her to Exemestane 25mg  daily in 11/2019.      12/24/2016 Imaging     BONE SCAN IMPRESSION: No evidence of osseous metastatic disease.     12/24/2016 Imaging      CT CAP IMPRESSION: 1. Status post bilateral modified radical mastectomy and breast reconstruction, as above. No findings to suggest metastatic disease in the chest, abdomen or pelvis. 2. Multiple heterogeneous appearing thyroid nodules, as discussed above. These are nonspecific but further evaluation with nonemergent thyroid ultrasound is recommended in the near future to determine any potential need for further evaluation with fine-needle aspiration. 3. Incidental findings, as above.     02/11/2017 Surgery     REMOVAL OF BILATERAL TISSUE EXPANDERS WITH PLACEMENT OF BILATERAL SILICONE BREAST IMPLANTS by Dr. Ulice Bold      06/19/2022 Imaging     R AX [place]:  Targeted ultrasound is performed, showing a 5 x 2 x 2 mm irregular hypoechoic mass right breast 1 o'clock position 3 cm from nipple at the site of palpable concern. No right axillary adenopathy.   IMPRESSION: Indeterminate palpable mass right breast 1 o'clock position.     07/01/2022 Pathology Results     Recurrence/new    Breast, right, needle core biopsy, 1 o'clock, 3cmfn, hydromark clip INVASIVE DUCTAL CARCINOMA, RECURRENT OVERALL GRADE: 2   PROGNOSTIC INDICATORS The tumor cells are EQUIVOCAL for Her2 (2+).  Estrogen Receptor: 100%, POSITIVE, STRONG STAINING INTENSITY Progesterone Receptor: 100%, POSITIVE, STRONG STAINING INTENSITY Proliferation Marker Ki67: 20%   1A. Breast, righnt,needle core biopsy, 1 o'clock, 3 cmfn FLUORESCENCE IN-SITU HYBRIDIZATION Results: GROUP 5: HER2 **NEGATIVE**         ASSESSMENT:  Olivia [name] is a 62 y.o. female with    Malignant neoplasm of upper-inner quadrant of right breast in female, estrogen receptor positive (HCC) invasive ductal carcinoma, pT2N0, M0), Stage 1B, ER/PR: positive, HER2 negative, Grade 3, Oncotype RS 21, recurrence in 07/2022 -diagnosed 10/2016, s/p bilateral mastectomy and reconstruction. Based on oncotype RS she did not need adjuvant chemotherapy. And given  node-negative mastectomy she did not require adjuvant radiation -started adjuvant letrozole 12/2016. She was changed to exemestane 11/2019 due to arthralgia.  Planned for a total of 7 years but stopped 12/2021 due to worsening bone density.  -Recent US/bx showed recurrent vs new R breast IDC, G2, ER/PR strongly positive, HER2 negative, with Ki67 20%.  Staging CT and bone scan showed no definitive evidence of metastasis. -She underwent right lumpectomy on August 18, 2022, no residual malignant cell was seen on surgical path.  Her initial biopsy showed 1.5 mm invasive cancer, the cancer was removed completely by biopsy. -Given the small size of invasive cancer, no benefit of adjuvant chemotherapy. -She previously received a total of 5 years of AI.  She stopped in November 2023 due to osteoprosis -I recommended 5 years of tamoxifen, benefit and potential side effects discussed with her, she is interested.   Osteoporosis initially on fosamax x5 years, then on prolia q6 months 03/2015 - 11/2020 -Repeat DEXA 11/2021 showed worsening T score -2.4 of RFN -She fell 11/2021 and fractured C7 -She began Zometa (q6 month) on 12/2021; s/p 2 doses, will continue for 2 more doses      Genetic testing -Breast/GYN panel (23 genes) was negative      PLAN: -I recommend adjuvant  tamoxifen for 5 years, she will start in 2-4 weeks -lab and Zometa in 1 month -f/u phone visit in 3 months - I prescribe Tamoxifen -will check with rad/onc to see if she needs adjuvant RT give her recurrence     Lymphedema issues, if any:  none to report  Pain issues, if any:  none to report  SAFETY ISSUES: Prior radiation? Yes, 2003  Pacemaker/ICD? no Possible current pregnancy?no Is the patient on methotrexate? no  Current Complaints / other details:  Pt has implants with reconstruction. She is worried about how they will effect treatment.

## 2022-09-08 ENCOUNTER — Encounter: Payer: Self-pay | Admitting: *Deleted

## 2022-09-15 ENCOUNTER — Encounter: Payer: Self-pay | Admitting: Radiation Oncology

## 2022-09-15 ENCOUNTER — Telehealth: Payer: Self-pay

## 2022-09-15 NOTE — Telephone Encounter (Signed)
Rn called pt for meaningful use and nurse evaluation information. Note completed and routed to Dr. Basilio Cairo and Quitman Livings PA-C for review.

## 2022-09-15 NOTE — Progress Notes (Signed)
Radiation Oncology         [phone] ________________________________  Initial Outpatient Consultation  Name: Olivia [name] MRN: [id number]  Date: 09/16/2022  DOB: 03/03/1960  WU:[id number], Ezequiel Essex, DO  Malachy Mood, MD   REFERRING PHYSICIAN: Malachy Mood, MD  DIAGNOSIS: [phone]   ICD-10-CM   1. Malignant neoplasm of upper-inner quadrant of right breast in female, estrogen receptor positive (HCC)  C50.211 Ambulatory Referral to Genetics   Z17.0        Cancer Staging  Malignant neoplasm of upper-inner quadrant of right breast in female, estrogen receptor positive (HCC) Staging form: Breast, AJCC 8th Edition - Clinical stage from 10/09/2016: Stage IA (cT1c, cN0, cM0, G3, ER+, PR+, HER2-) - Signed by Malachy Mood, MD on 10/14/2016 Nuclear grade: G3 Histologic grading system: 3 grade system Laterality: Right - Pathologic stage from 11/17/2016: Stage IB (pT2, pN0, cM0, G3, ER+, PR+, HER2-, Oncotype DX score: 21) - Signed by Malachy Mood, MD on 12/11/2016 Neoadjuvant therapy: No Nuclear grade: G3 Multigene prognostic tests performed: Oncotype DX Recurrence score range: Greater than or equal to 11 Histologic grading system: 3 grade system Residual tumor (R): R0 - None Laterality: Right   Recent recurrence of right breast cancer (third recurrence) : Right Chest Wall UIQ Invasive Ductal Carcinoma, ER+ / PR+ / Her2-, Grade 2: s/p lumpectomy with no residual carcinoma   Initially diagnosed with right breast cancer in 2003: s/p lumpectomy, nodal biopsies (with 3/7 positive nodes), followed by adjuvant radiation and chemotherapy.   Diagnosed with her second recurrence in the right breast in 2018 - IDC / ER/PR+ / Her2-; Grade 3: s/p right mastectomy, prophylactic left mastectomy and bilateral reconstructive surgery followed by antiestrogen therapy   CHIEF COMPLAINT: Here to discuss management of recurrent right breast cancer  HISTORY OF PRESENT ILLNESS:Olivia [name] is a 62 y.o. female who presents  today for consideration of radiation therapy in management of her third recurrence of right breast cancer.   She was initially diagnosed with right breast cancer in 2003: s/p lumpectomy, nodal biopsies (with 3/7 positive nodes), followed by adjuvant radiation and chemotherapy. She had another recurrence in the upper inner right breast in 2018 - biopsy of the upper inner right breast on 10/09/2016 showed grade 3 invasive ductal carcinoma; ER/PR+; Her2 negative. Surgical pathology from her bilateral mastectomies on 11/17/2016 showed grade III invasive ductal carcinoma 2.4 cm in length, invasive carcinoma <0.1 cm of the posterior margin (broadly) and anterior margin (focally) from the right breast. No evidence of malignancy was seen in the left breast tissue or left axillary lymph node. She was seen in the [facility] by Dr. Roselind Messier at that time and was not recommended further radiation to the right breast given her prior history of radiation to the right breast. She was subsequently treated with a right mastectomy. She also underwent bilateral reconstructive surgery with Dr. Ulice Bold. She established care with Dr. Mosetta Putt at that time and started adjuvant letrozole in November 2018. Her antiestrogen therapy was changed to exemestane In October 2021 due to arthralgia. She was initially planned to receive antiestrogen therapy for a total of 7 years however exemestane was discontinued in November 2023 due to worsening bone density (continues to take zometa).  Her history pertaining to her recent recurrence in the right breast is detailed as follows:  This past May (2024), the patient presented to Dr. Ulice Bold (plastics) for a routine follow-up visit. During that visit, the patient reported noticing a palpable small pea-sized lump in the upper  inner central right reconstructed breast. She subsequently underwent an ultrasound of the right breast on 06/19/22 which demonstrated an indeterminate mass in the 1 o'clock right  chest wall, 3 cmfn, measuring 5 mm, correlating with the palpable site of concern. No evidence of right axillary lymphadenopathy was appreciated.   Biopsy of the 1 o'clock right chest wall mass on date of 07/01/22 showed grade 2 invasive ductal carcinoma measuring 1.5 mm in the greatest linear extent of the sample, along with chronic lymphocytic inflammation.  ER status: 100% positive and PR status 100% positive, both with strong staining intensity; Proliferation marker Ki67 at 20%; Her2 status negative; Grade 2. No lymph nodes were examined.   She was accordingly referred back to general surgery and opted to proceed with a right chest wall lumpectomy on 08/18/22 under the care of Dr. Carolynne Edouard. Pathology from the procedure showed no residual cancer.   She most recently followed up with Dr. Mosetta Putt on 09/02/22. Given the small size of the invasive cancer, and no residual carcinoma s/p lumpectomy, Dr. Mosetta Putt does not recommend adjuvant chemotherapy. In terms of antiestrogen therapy, she has opted to proceed with tamoxifen x 5 years.  Other pertinent imaging performed thus far includes:  -- CT CAP on 08/06/22 which demonstrated no evidence of metastatic disease in the chest, abdomen or pelvis. Expected surgical changes were also appreciated bilaterally, s/p bilateral mastectomies and reconstructive surgery.  -- Whole body bone scan on 08/06/22 showed nonspecific radiotracer uptake in the posterior calvarium, and nonspecific subtle areas of abnormal radiotracer uptake in the bilateral ribs near the costochondral cartilage without discrete CT correlate (possibly reflecting senescent mineralization of the cartilage but still nonspecific). -- In light of bone scan findings, a CT of the head was performed on 08/12/22 which showed no suspicious lytic or sclerotic bone lesions, and specifically no posterior calvarial bone lesion. No acute intracranial abnormalities were appreciated.   Today she reports to be doing well  overall. She denies any pain, troubles with range of motion, or lymphedema issues.   PREVIOUS RADIATION THERAPY: Yes   Right breast and locoregional area treated in 2003 by Dr. Dorna Bloom (initial diagnosis)   PAST MEDICAL HISTORY:  has a past medical history of Adjustment insomnia (02/23/2019), Breast cancer (HCC) (2018), Cervical radiculopathy (07/05/2019), COVID-19 (02/2019), Family history of breast cancer, Genetic testing (10/30/2016), GERD (gastroesophageal reflux disease), Grief reaction (02/23/2019), Heart murmur, History of echocardiogram (05/2015), History of exercise stress test (05/2015), HLD (hyperlipidemia), Hypothyroidism, Multiple thyroid nodules (2009), Osteoporosis, Personal history of chemotherapy, Personal history of radiation therapy, and UNEQUAL LEG LENGTH, ACQUIRED (11/17/2006).    PAST SURGICAL HISTORY: Past Surgical History:  Procedure Laterality Date   ABDOMINAL HYSTERECTOMY     BREAST BIOPSY Right 07/01/2022   [place] RT BREAST BX W LOC DEV 1ST LESION IMG BX SPEC US GUIDE 07/01/2022 [facility]   BREAST BIOPSY  08/15/2022   [place] RT RADIOACTIVE SEED LOC 08/15/2022 [facility]   BREAST LUMPECTOMY Right 2003   BREAST LUMPECTOMY WITH RADIOACTIVE SEED LOCALIZATION Right 08/18/2022   Procedure: RIGHT BREAST LUMPECTOMY WITH RADIOACTIVE SEED LOCALIZATION;  Surgeon: Griselda Miner, MD;  Location: [facility];  Service: General;  Laterality: Right;   BREAST RECONSTRUCTION WITH PLACEMENT OF TISSUE EXPANDER AND FLEX HD (ACELLULAR HYDRATED DERMIS) Bilateral 11/17/2016   Procedure: IMEDITATE BREAST RECONSTRUCTION WITH PLACEMENT OF TISSUE EXPANDER AND FLEX HD (ACELLULAR HYDRATED DERMIS);  Surgeon: Peggye Form, DO;  Location: [facility];  Service: Plastics;  Laterality: Bilateral;   BREAST SURGERY  2003 /right   lumpectomy; chemotherapy and radiation   LIPOSUCTION WITH LIPOFILLING Bilateral 02/23/2020   Procedure: Fat filling of breasts for symmetry from abdomen;   Surgeon: Peggye Form, DO;  Location: [facility];  Service: Plastics;  Laterality: Bilateral;  90 min, please   LIPOSUCTION WITH LIPOFILLING Bilateral 05/16/2021   Procedure: LIPOSUCTION WITH LIPOFILLING;  Surgeon: Peggye Form, DO;  Location: [facility];  Service: Plastics;  Laterality: Bilateral;  1.5 hours   REMOVAL OF BILATERAL TISSUE EXPANDERS WITH PLACEMENT OF BILATERAL BREAST IMPLANTS Bilateral 02/11/2017   Procedure: REMOVAL OF BILATERAL TISSUE EXPANDERS WITH PLACEMENT OF BILATERAL SILICONE BREAST IMPLANTS;  Surgeon: Peggye Form, DO;  Location:  SURGERY CENTER;  Service: Plastics;  Laterality: Bilateral;   TOTAL ABDOMINAL HYSTERECTOMY W/ BILATERAL SALPINGOOPHORECTOMY     for treatment of breast treatment   TOTAL MASTECTOMY Bilateral 11/17/2016   Procedure: RIGHT MASTECTOMY AND LEFT PROPHYLATIC MASTECTOMY;  Surgeon: Griselda Miner, MD;  Location: [facility];  Service: General;  Laterality: Bilateral;    FAMILY HISTORY: family history includes Breast cancer in her cousin and cousin; Breast cancer (age of onset: 59) in her paternal aunt; Cancer (age of onset: 62) in her brother; Coronary artery disease in her mother; Diabetes in her father and another family member; Esophageal cancer in her cousin; Heart attack (age of onset: 68) in her mother; Kidney disease in her father; Lung cancer in her maternal uncle; Prostate cancer (age of onset: 24) in her father; Skin cancer in her mother.  SOCIAL HISTORY:  reports that she has never smoked. She has never used smokeless tobacco. She reports current alcohol use. She reports that she does not use drugs.  ALLERGIES: Patient has no known allergies.  MEDICATIONS:  Current Outpatient Medications  Medication Sig Dispense Refill   atorvastatin (LIPITOR) 20 MG tablet Take 1 tablet (20 mg total) by mouth daily. 90 tablet 3   Calcium 500 MG CHEW Chew by mouth.     Collagenase POWD by Does not apply  route.     diclofenac (VOLTAREN) 75 MG EC tablet Take 1 tablet (75 mg total) by mouth daily. 90 tablet 3   ELDERBERRY PO Take by mouth.     psyllium (METAMUCIL) 58.6 % powder Take 1 packet by mouth daily.     tamoxifen (NOLVADEX) 20 MG tablet Take 1 tablet (20 mg total) by mouth daily. 30 tablet 3   Vitamin D, Cholecalciferol, 25 MCG (1000 UT) CAPS Take 1 capsule by mouth daily.     No current facility-administered medications for this encounter.    REVIEW OF SYSTEMS: As above in HPI.   PHYSICAL EXAM:  height is 5\' 4"  (1.626 m) and weight is 145 lb 12.8 oz (66.1 kg). Her temperature is 97.8 F (36.6 C). Her blood pressure is 125/79 and her pulse is 68. Her respiration is 18 and oxygen saturation is 100%.   General: Alert and oriented, in no acute distress HEENT: Head is normocephalic. Extraocular movements are intact. Oropharynx is clear. Neck: Neck is supple, no palpable cervical or supraclavicular lymphadenopathy. Heart: Regular in rate and rhythm with no murmurs, rubs, or gallops. Chest: Clear to auscultation bilaterally, with no rhonchi, wheezes, or rales. Abdomen: Soft, nontender, nondistended, with no rigidity or guarding. Extremities: No cyanosis or edema. Lymphatics: see Neck Exam Skin: No concerning lesions. Musculoskeletal: symmetric strength and muscle tone throughout. Neurologic: Cranial nerves II through XII are grossly intact. No obvious focalities. Speech is fluent. Coordination is  intact. Psychiatric: Judgment and insight are intact. Affect is appropriate. Chest wall: Implants in place s/p bilateral mastectomy. Periareolar scar on right has healed well from recent excision. No palpable mass or abnormalities appreciated on either side of the chest wall or axilla.     ECOG = 0  0 - Asymptomatic (Fully active, able to carry on all predisease activities without restriction)  1 - Symptomatic but completely ambulatory (Restricted in physically strenuous activity but  ambulatory and able to carry out work of a light or sedentary nature. For example, light housework, office work)  2 - Symptomatic, <50% in bed during the day (Ambulatory and capable of all self care but unable to carry out any work activities. Up and about more than 50% of waking hours)  3 - Symptomatic, >50% in bed, but not bedbound (Capable of only limited self-care, confined to bed or chair 50% or more of waking hours)  4 - Bedbound (Completely disabled. Cannot carry on any self-care. Totally confined to bed or chair)  5 - Death   Santiago Glad MM, Creech RH, Tormey DC, et al. [phone]). "Toxicity and response criteria of the [facility]". Am. Evlyn Clines. Oncol. 5 (6): 649-55   LABORATORY DATA:  Lab Results  Component Value Date   WBC 6.6 07/10/2022   HGB 15.0 07/10/2022   HCT 44.8 07/10/2022   MCV 89.1 07/10/2022   PLT 279 07/10/2022   CMP     Component Value Date/Time   NA 141 07/10/2022 1116   NA 141 01/22/2017 1028   K 3.9 07/10/2022 1116   K 4.4 01/22/2017 1028   CL 103 07/10/2022 1116   CO2 30 07/10/2022 1116   CO2 27 01/22/2017 1028   GLUCOSE 97 07/10/2022 1116   GLUCOSE 84 01/22/2017 1028   BUN 26 (H) 07/10/2022 1116   BUN 18.0 01/22/2017 1028   CREATININE 1.16 (H) 07/10/2022 1116   CREATININE 1.0 01/22/2017 1028   CALCIUM 10.2 07/10/2022 1116   CALCIUM 9.7 01/22/2017 1028   PROT 7.5 07/10/2022 1116   PROT 7.5 01/22/2017 1028   ALBUMIN 5.0 07/10/2022 1116   ALBUMIN 4.5 01/22/2017 1028   AST 36 07/10/2022 1116   AST 29 01/22/2017 1028   ALT 36 07/10/2022 1116   ALT 35 01/22/2017 1028   ALKPHOS 77 07/10/2022 1116   ALKPHOS 58 01/22/2017 1028   BILITOT 0.7 07/10/2022 1116   BILITOT 0.47 01/22/2017 1028   GFRNONAA 54 (L) 07/10/2022 1116   GFRAA >60 11/04/2019 1026         RADIOGRAPHY: MM Breast Surgical Specimen  Result Date: 08/18/2022 CLINICAL DATA:  Evaluate surgical specimen following RIGHT lumpectomy. EXAM: SPECIMEN RADIOGRAPH OF THE  RIGHT BREAST COMPARISON:  Previous exam(s). FINDINGS: Status post excision of the RIGHT breast. The radioactive seed and COIL biopsy marking clip are present and intact. The COIL biopsy marking clip is located at the edge of the specimen which was relayed to the OR staff IMPRESSION: Specimen radiograph of the right breast. Electronically Signed   By: Harmon Pier M.D.   On: 08/18/2022 08:13      IMPRESSION/PLAN: Recent recurrence of right breast cancer (third diagnosis, second recurrence) : Right Breast UIQ Invasive Ductal Carcinoma, ER+ / PR+ / Her2-, Grade 2: s/p lumpectomy with no residual carcinoma    It was a pleasure meeting the patient today. We reached out to Dr. Carolynne Edouard today to confirm that no residual tumor was seen from a surgical standpoint and that we can proceed  with radiation treatment.    This is her 2nd recurrence (3rd local surgery for breast cancer) and she had RT to an intact breast about 20+ yrs ago.  Even though this is a small recurrence and she had RT before, I think RT again is very reasonable. Chest wall recurrences tend to be more aggressive and worrisome for locoregional progression without intensive adjuvant treatment.  We discussed the risks, benefits, and side effects of radiotherapy. I recommend radiotherapy to the right chest wall to reduce her risk of locoregional recurrence by 2/3.  We discussed that radiation would take approximately 6-7 weeks to complete.We spoke about acute effects including skin irritation /peeling/ pain and fatigue as well as much less common late effects including internal organ injury or irritation. Rib fracture or soft tissue/nerve injury is possible. We spoke about the latest technology that is used to minimize the risk of late effects for patients undergoing radiotherapy to the breast or chest wall. Patient does have implants in place which she understands puts her at risk for capsular contracture or a non healing wound. To reduce her risk, we  recommend a conventional radiation treatment.   Patient previously had genetic testing over 6 years ago. Referral made to genetics today for possibility of updated testing.   Notably, patient has an eight day trip to [place] planned next week. We will schedule her CT simulation when she returns.   On date of service, in total, I spent 60 minutes on this encounter. Patient was seen in person.   __________________________________________   Joyice Faster, PA-C    Lonie Peak, MD  This document serves as a record of services personally performed by Lonie Peak, MD. It was created on her behalf by Neena Rhymes, a trained medical scribe. The creation of this record is based on the scribe's personal observations and the provider's statements to them. This document has been checked and approved by the attending provider.

## 2022-09-16 ENCOUNTER — Telehealth: Payer: Self-pay | Admitting: Genetic Counselor

## 2022-09-16 ENCOUNTER — Other Ambulatory Visit: Payer: Self-pay

## 2022-09-16 ENCOUNTER — Ambulatory Visit
Admission: RE | Admit: 2022-09-16 | Discharge: 2022-09-16 | Disposition: A | Discharge status: Home / Self Care | Payer: BC Managed Care – PPO | Source: Ambulatory Visit | Attending: Radiation Oncology | Admitting: Radiation Oncology

## 2022-09-16 VITALS — BP 125/79 | HR 68 | Temp 97.8°F | Resp 18 | Ht 64.0 in | Wt 145.8 lb

## 2022-09-16 DIAGNOSIS — E042 Nontoxic multinodular goiter: Secondary | ICD-10-CM | POA: Diagnosis not present

## 2022-09-16 DIAGNOSIS — Z803 Family history of malignant neoplasm of breast: Secondary | ICD-10-CM | POA: Insufficient documentation

## 2022-09-16 DIAGNOSIS — E785 Hyperlipidemia, unspecified: Secondary | ICD-10-CM | POA: Diagnosis not present

## 2022-09-16 DIAGNOSIS — E039 Hypothyroidism, unspecified: Secondary | ICD-10-CM | POA: Insufficient documentation

## 2022-09-16 DIAGNOSIS — D0511 Intraductal carcinoma in situ of right breast: Secondary | ICD-10-CM

## 2022-09-16 DIAGNOSIS — M810 Age-related osteoporosis without current pathological fracture: Secondary | ICD-10-CM | POA: Diagnosis not present

## 2022-09-16 DIAGNOSIS — R011 Cardiac murmur, unspecified: Secondary | ICD-10-CM | POA: Diagnosis not present

## 2022-09-16 DIAGNOSIS — M5412 Radiculopathy, cervical region: Secondary | ICD-10-CM | POA: Diagnosis not present

## 2022-09-16 DIAGNOSIS — Z9221 Personal history of antineoplastic chemotherapy: Secondary | ICD-10-CM | POA: Insufficient documentation

## 2022-09-16 DIAGNOSIS — K219 Gastro-esophageal reflux disease without esophagitis: Secondary | ICD-10-CM | POA: Insufficient documentation

## 2022-09-16 DIAGNOSIS — Z8616 Personal history of COVID-19: Secondary | ICD-10-CM | POA: Diagnosis not present

## 2022-09-16 DIAGNOSIS — Z8042 Family history of malignant neoplasm of prostate: Secondary | ICD-10-CM | POA: Insufficient documentation

## 2022-09-16 DIAGNOSIS — Z923 Personal history of irradiation: Secondary | ICD-10-CM | POA: Diagnosis not present

## 2022-09-16 DIAGNOSIS — Z853 Personal history of malignant neoplasm of breast: Secondary | ICD-10-CM | POA: Insufficient documentation

## 2022-09-16 DIAGNOSIS — Z170 Estrogen receptor positive status [ER+]: Secondary | ICD-10-CM | POA: Insufficient documentation

## 2022-09-16 DIAGNOSIS — Z9013 Acquired absence of bilateral breasts and nipples: Secondary | ICD-10-CM | POA: Diagnosis not present

## 2022-09-16 DIAGNOSIS — Z79899 Other long term (current) drug therapy: Secondary | ICD-10-CM | POA: Insufficient documentation

## 2022-09-16 DIAGNOSIS — Z801 Family history of malignant neoplasm of trachea, bronchus and lung: Secondary | ICD-10-CM | POA: Diagnosis not present

## 2022-09-16 DIAGNOSIS — Z79811 Long term (current) use of aromatase inhibitors: Secondary | ICD-10-CM | POA: Insufficient documentation

## 2022-09-16 DIAGNOSIS — C50211 Malignant neoplasm of upper-inner quadrant of right female breast: Secondary | ICD-10-CM | POA: Diagnosis not present

## 2022-09-16 NOTE — Telephone Encounter (Signed)
Contacted patient to scheduled appointments. Patient is aware of appointments that are scheduled.   

## 2022-09-23 ENCOUNTER — Encounter: Payer: Self-pay | Admitting: *Deleted

## 2022-09-25 ENCOUNTER — Telehealth: Payer: Self-pay

## 2022-09-25 NOTE — Telephone Encounter (Signed)
Pt called RN back. Rn was able to let her know about an ultrasound that would be scheduled. RN explained to pt that Dr. Carolynne Edouard and Dr. Basilio Cairo expect this to be normal and will  not effect ct sim for planning. Pt was grateful for the information.

## 2022-09-25 NOTE — Telephone Encounter (Signed)
RN left message for pt in an attempt to let her know of an upcoming ultrasound that Dr. Carolynne Edouard and Dr. Basilio Cairo would like her to have. RN left message for pt with call back information. RN will call back later as well.

## 2022-09-25 NOTE — Telephone Encounter (Signed)
Rn left second message for pt with call back information. Rn awaiting call back from pt.

## 2022-09-27 ENCOUNTER — Other Ambulatory Visit: Payer: Self-pay

## 2022-09-27 ENCOUNTER — Ambulatory Visit
Admission: EM | Admit: 2022-09-27 | Discharge: 2022-09-27 | Disposition: A | Discharge status: Home / Self Care | Payer: BC Managed Care – PPO | Attending: Family Medicine | Admitting: Family Medicine

## 2022-09-27 ENCOUNTER — Encounter: Payer: Self-pay | Admitting: Emergency Medicine

## 2022-09-27 DIAGNOSIS — G4489 Other headache syndrome: Secondary | ICD-10-CM

## 2022-09-27 DIAGNOSIS — R059 Cough, unspecified: Secondary | ICD-10-CM

## 2022-09-27 DIAGNOSIS — U071 COVID-19: Secondary | ICD-10-CM | POA: Diagnosis not present

## 2022-09-27 MED ORDER — PAXLOVID (300/100) 20 X 150 MG & 10 X 100MG PO TBPK: 3.0000 | ORAL_TABLET | Freq: Two times a day (BID) | ORAL | 0 refills | Status: AC

## 2022-09-27 MED ORDER — BENZONATATE 200 MG PO CAPS: 200.0000 mg | ORAL_CAPSULE | Freq: Three times a day (TID) | ORAL | 0 refills | Status: AC | PRN

## 2022-09-27 MED ORDER — KETOROLAC TROMETHAMINE 60 MG/2ML IM SOLN
60.0000 mg | Freq: Once | INTRAMUSCULAR | Status: AC
  Administered 2022-09-27: 60 mg via INTRAMUSCULAR

## 2022-09-27 NOTE — ED Provider Notes (Signed)
[facility]    CSN: [id number] Arrival date & time: 09/27/22  1040      History   Chief Complaint Chief Complaint  Patient presents with   Cough    HPI Olivia [name] is a 62 y.o. female.   HPI Pleasant 62 year old female presents with sore throat, cough, headache for 2 days.  Reports just returning home from trip from [place] in which several people tested positive for COVID.  Patient reports COVID-19 test was negative last night at home. PMH significant for breast cancer, GERD, and HLD.  Past Medical History:  Diagnosis Date   Adjustment insomnia 02/23/2019   Breast cancer (HCC) 2018   Lumpectomy, chemotherapy and radiation with recurrence 2018. Now on Femara   Cervical radiculopathy 07/05/2019   COVID-19 02/2019   Her husband also passed away at this time from COVID-19.   Family history of breast cancer    Genetic testing 10/30/2016   Breast/GYN panel (23 genes) @ [facility] - No pathogenic mutations detected   GERD (gastroesophageal reflux disease)    OTC medication   Grief reaction 02/23/2019   Heart murmur    as a child - Echo in early 20s "ok"   History of echocardiogram 05/2015   Echo 4/17: EF 55-60%, GLS -19.6%, normal wall motion   History of exercise stress test 05/2015   ETT 4/17 - No ST changes   HLD (hyperlipidemia)    Hypothyroidism    Multiple thyroid nodules 2009   Multiple nodules: Normal ultrasound reported at diagnosis   Osteoporosis    Personal history of chemotherapy    Personal history of radiation therapy    UNEQUAL LEG LENGTH, ACQUIRED 11/17/2006   Qualifier: Diagnosis of  By: Darrick Penna MD, KARL      Patient Active Problem List   Diagnosis Date Noted   Fracture of vertebra due to osteoporosis (HCC) 11/27/2021   Nondisplaced fracture of triquetral bone of right wrist with routine healing 11/27/2021   Vasovagal syncope 11/27/2021   On statin therapy 05/23/2021   Mixed hyperlipidemia 05/23/2021   Essential hypertension 08/10/2020    Hypercholesterolemia 01/02/2020   Status post bilateral breast reconstruction 06/10/2019   Arthritis 04/22/2018   Acquired absence of bilateral breasts and nipples 11/24/2016   Family history of breast cancer    Malignant neoplasm of upper-inner quadrant of right breast in female, estrogen receptor positive (HCC) 10/14/2016   Mixed emotional features as adjustment reaction 11/09/2015   Encounter for preventive health examination 12/26/2014   Multiple thyroid nodules 12/26/2014   Osteoporosis 12/26/2014   Hypothyroidism 11/17/2006    Past Surgical History:  Procedure Laterality Date   ABDOMINAL HYSTERECTOMY     BREAST BIOPSY Right 07/01/2022   [place] RT BREAST BX W LOC DEV 1ST LESION IMG BX SPEC US GUIDE 07/01/2022 [facility]   BREAST BIOPSY  08/15/2022   [place] RT RADIOACTIVE SEED LOC 08/15/2022 [facility]   BREAST LUMPECTOMY Right 2003   BREAST LUMPECTOMY WITH RADIOACTIVE SEED LOCALIZATION Right 08/18/2022   Procedure: RIGHT BREAST LUMPECTOMY WITH RADIOACTIVE SEED LOCALIZATION;  Surgeon: Griselda Miner, MD;  Location: [facility];  Service: General;  Laterality: Right;   BREAST RECONSTRUCTION WITH PLACEMENT OF TISSUE EXPANDER AND FLEX HD (ACELLULAR HYDRATED DERMIS) Bilateral 11/17/2016   Procedure: IMEDITATE BREAST RECONSTRUCTION WITH PLACEMENT OF TISSUE EXPANDER AND FLEX HD (ACELLULAR HYDRATED DERMIS);  Surgeon: Peggye Form, DO;  Location: [facility];  Service: Plastics;  Laterality: Bilateral;   BREAST SURGERY  2003 [facility]  lumpectomy; chemotherapy and radiation   LIPOSUCTION WITH LIPOFILLING Bilateral 02/23/2020   Procedure: Fat filling of breasts for symmetry from abdomen;  Surgeon: Peggye Form, DO;  Location: [facility];  Service: Plastics;  Laterality: Bilateral;  90 min, please   LIPOSUCTION WITH LIPOFILLING Bilateral 05/16/2021   Procedure: LIPOSUCTION WITH LIPOFILLING;  Surgeon: Peggye Form, DO;  Location: [facility];  Service: Plastics;  Laterality: Bilateral;  1.5 hours   REMOVAL OF BILATERAL TISSUE EXPANDERS WITH PLACEMENT OF BILATERAL BREAST IMPLANTS Bilateral 02/11/2017   Procedure: REMOVAL OF BILATERAL TISSUE EXPANDERS WITH PLACEMENT OF BILATERAL SILICONE BREAST IMPLANTS;  Surgeon: Peggye Form, DO;  Location: [facility];  Service: Plastics;  Laterality: Bilateral;   TOTAL ABDOMINAL HYSTERECTOMY W/ BILATERAL SALPINGOOPHORECTOMY     for treatment of breast treatment   TOTAL MASTECTOMY Bilateral 11/17/2016   Procedure: RIGHT MASTECTOMY AND LEFT PROPHYLATIC MASTECTOMY;  Surgeon: Griselda Miner, MD;  Location: [facility];  Service: General;  Laterality: Bilateral;    OB History   No obstetric history on file.      Home Medications    Prior to Admission medications   Medication Sig Start Date End Date Taking? Authorizing Provider  benzonatate (TESSALON) 200 MG capsule Take 1 capsule (200 mg total) by mouth 3 (three) times daily as needed for up to 7 days. 09/27/22 10/04/22 Yes Trevor Iha, FNP  nirmatrelvir & ritonavir (PAXLOVID, 300/100,) 20 x 150 MG & 10 x 100MG  TBPK Take 3 tablets by mouth 2 (two) times daily for 5 days. 09/27/22 10/02/22 Yes Trevor Iha, FNP  atorvastatin (LIPITOR) 20 MG tablet Take 1 tablet (20 mg total) by mouth daily. 05/26/22   Kuneff, Renee A, DO  Calcium 500 MG CHEW Chew by mouth.    [provider]  Collagenase POWD by Does not apply route.    [provider]  diclofenac (VOLTAREN) 75 MG EC tablet Take 1 tablet (75 mg total) by mouth daily. 05/26/22   Kuneff, Renee A, DO  ELDERBERRY PO Take by mouth.    [provider]  psyllium (METAMUCIL) 58.6 % powder Take 1 packet by mouth daily.    [provider]  tamoxifen (NOLVADEX) 20 MG tablet Take 1 tablet (20 mg total) by mouth daily. 09/02/22   Malachy Mood, MD  Vitamin D, Cholecalciferol, 25 MCG (1000 UT) CAPS Take 1 capsule by mouth daily.    [provider]    Family History Family History  Problem Relation Age of Onset   Coronary artery disease Mother        1ST DEGREE RELATIVE   Heart attack Mother 60   Skin cancer Mother    Diabetes Father    Kidney disease Father    Prostate cancer Father 50       deceased 53   Cancer Brother 13       multiple myeloma    Lung cancer Maternal Uncle        in 2 mat uncles   Breast cancer Paternal Aunt 45   Breast cancer Cousin        dx 12s; daughter of unaffected paternal aunt   Breast cancer Cousin        dx 36s; another daughter of unaffected paternal aunt   Esophageal cancer Cousin        pat first cousin   Diabetes Other        1ST DEGREE RELATIVE   Colon cancer Neg Hx  Colon polyps Neg Hx    Stomach cancer Neg Hx    Rectal cancer Neg Hx     Social History Social History   Tobacco Use   Smoking status: Never   Smokeless tobacco: Never  Vaping Use   Vaping status: Never Used  Substance Use Topics   Alcohol use: Yes    Alcohol/week: 0.0 standard drinks of alcohol    Comment: Rare   Drug use: No     Allergies   Patient has no known allergies.   Review of Systems Review of Systems  HENT:  Positive for congestion and sore throat.   Respiratory:  Positive for cough.   Neurological:  Positive for headaches.  All other systems reviewed and are negative.    Physical Exam Triage Vital Signs ED Triage Vitals  Encounter Vitals Group     BP      Systolic BP Percentile      Diastolic BP Percentile      Pulse      Resp      Temp      Temp src      SpO2      Weight      Height      Head Circumference      Peak Flow      Pain Score      Pain Loc      Pain Education      Exclude from Growth Chart    No data found.  Updated Vital Signs BP (!) 160/102 (BP Location: Left Arm) Comment: states always high at Dr  Pulse 83   Temp 98.2 F (36.8 C) (Oral)   Resp 16   SpO2 99%    Physical Exam Vitals and nursing note reviewed.  Constitutional:       Appearance: Normal appearance. She is normal weight. She is ill-appearing.  HENT:     Head: Normocephalic and atraumatic.     Right Ear: Tympanic membrane, ear canal and external ear normal.     Left Ear: Tympanic membrane, ear canal and external ear normal.     Mouth/Throat:     Mouth: Mucous membranes are moist.     Pharynx: Oropharynx is clear.  Eyes:     Extraocular Movements: Extraocular movements intact.     Conjunctiva/sclera: Conjunctivae normal.     Pupils: Pupils are equal, round, and reactive to light.  Cardiovascular:     Rate and Rhythm: Normal rate and regular rhythm.     Pulses: Normal pulses.     Heart sounds: Normal heart sounds.  Pulmonary:     Effort: Pulmonary effort is normal.     Breath sounds: Normal breath sounds. No wheezing, rhonchi or rales.     Comments: Infrequent nonproductive/productive cough noted on exam Musculoskeletal:        General: Normal range of motion.     Cervical back: Normal range of motion and neck supple.  Skin:    General: Skin is warm and dry.  Neurological:     General: No focal deficit present.     Mental Status: She is alert and oriented to person, place, and time. Mental status is at baseline.  Psychiatric:        Mood and Affect: Mood normal.        Behavior: Behavior normal.        Thought Content: Thought content normal.      UC Treatments / Results  Labs (all labs ordered are listed, but  only abnormal results are displayed) Labs Reviewed  POC SARS CORONAVIRUS 2 AG -  ED    EKG   Radiology No results found.  Procedures Procedures (including critical care time)  Medications Ordered in UC Medications  ketorolac (TORADOL) injection 60 mg (60 mg Intramuscular Given 09/27/22 1229)    Initial Impression / Assessment and Plan / UC Course  I have reviewed the triage vital signs and the nursing notes.  Pertinent labs & imaging results that were available during my care of the patient were reviewed by me and  considered in my medical decision making (see chart for details).     MDM: 1. COVID-19-POCT COVID-19 positive, Rx'd Paxlovid (300/100) 20 x 150 mg & 10 x 100 mg TBPK: Take 3 tablets twice daily x 5 days; 2.  Cough, unspecified type-Rx'd Tessalon 200 mg capsules 3 times daily, as needed; 3.  Other headache syndrome-IM Toradol 60 mg given once in clinic and prior to discharge. Advised patient to take medication as directed with food to completion.  Advised may take Tessalon daily or as needed for cough.  Encouraged increase daily water intake to 64 ounces per day while taking these medications.  Advised if symptoms worsen and/or unresolved please follow-up with PCP or here for further evaluation.  Patient discharged home, hemodynamically stable.  Final Clinical Impressions(s) / UC Diagnoses   Final diagnoses:  Cough, unspecified type  COVID-19  Other headache syndrome     Discharge Instructions      Advised patient to take medication as directed with food to completion.  Advised may take Tessalon daily or as needed for cough.  Encouraged increase daily water intake to 64 ounces per day while taking these medications.  Advised if symptoms worsen and/or unresolved please follow-up with PCP or here for further evaluation.     ED Prescriptions     Medication Sig Dispense Auth. Provider   nirmatrelvir & ritonavir (PAXLOVID, 300/100,) 20 x 150 MG & 10 x 100MG  TBPK Take 3 tablets by mouth 2 (two) times daily for 5 days. 30 tablet Trevor Iha, FNP   benzonatate (TESSALON) 200 MG capsule Take 1 capsule (200 mg total) by mouth 3 (three) times daily as needed for up to 7 days. 40 capsule Trevor Iha, FNP      PDMP not reviewed this encounter.   Trevor Iha, FNP 09/27/22 1339

## 2022-09-27 NOTE — Discharge Instructions (Addendum)
Advised patient to take medication as directed with food to completion.  Advised may take Tessalon daily or as needed for cough.  Encouraged increase daily water intake to 64 ounces per day while taking these medications.  Advised if symptoms worsen and/or unresolved please follow-up with PCP or here for further evaluation.

## 2022-09-27 NOTE — ED Triage Notes (Signed)
Pt c/o cough, sore throat and headache this am. States she just got home from [place] and was told several people fom trip tested positive for covid. Had negative covid test last night at home.

## 2022-09-29 ENCOUNTER — Ambulatory Visit: Payer: BC Managed Care – PPO | Admitting: Radiation Oncology

## 2022-09-29 ENCOUNTER — Encounter: Payer: Self-pay | Admitting: Hematology

## 2022-09-29 ENCOUNTER — Telehealth: Payer: Self-pay

## 2022-09-29 NOTE — Telephone Encounter (Signed)
Pt called RN to let nursing staff know she has tested positive for Covid (Saturday at urgent care). Pt reports she was a on a trip recently and several other tested positive as well. She is doing well with no symptoms at this time. Pt Ct sim rescheduled to 10-07-22 at 1500. Ct sim stated this will not effect her start date.

## 2022-09-30 ENCOUNTER — Ambulatory Visit: Payer: BC Managed Care – PPO

## 2022-09-30 ENCOUNTER — Other Ambulatory Visit: Payer: Self-pay | Admitting: General Surgery

## 2022-09-30 ENCOUNTER — Ambulatory Visit
Admission: RE | Admit: 2022-09-30 | Discharge: 2022-09-30 | Disposition: A | Discharge status: Home / Self Care | Payer: BC Managed Care – PPO | Source: Ambulatory Visit | Attending: General Surgery | Admitting: General Surgery

## 2022-09-30 ENCOUNTER — Other Ambulatory Visit: Payer: BC Managed Care – PPO

## 2022-09-30 DIAGNOSIS — Z170 Estrogen receptor positive status [ER+]: Secondary | ICD-10-CM

## 2022-09-30 DIAGNOSIS — C50211 Malignant neoplasm of upper-inner quadrant of right female breast: Secondary | ICD-10-CM

## 2022-09-30 DIAGNOSIS — N6312 Unspecified lump in the right breast, upper inner quadrant: Secondary | ICD-10-CM | POA: Diagnosis not present

## 2022-09-30 DIAGNOSIS — N6310 Unspecified lump in the right breast, unspecified quadrant: Secondary | ICD-10-CM

## 2022-09-30 DIAGNOSIS — Z9011 Acquired absence of right breast and nipple: Secondary | ICD-10-CM | POA: Diagnosis not present

## 2022-09-30 DIAGNOSIS — Z9889 Other specified postprocedural states: Secondary | ICD-10-CM | POA: Diagnosis not present

## 2022-10-01 ENCOUNTER — Telehealth: Payer: Self-pay

## 2022-10-01 NOTE — Telephone Encounter (Signed)
Pt called RN to let nursing staff and Dr. Basilio Cairo know that her ultrasound showed an area of concern. She will have a biopsy on this area tomorrow morning. Rn will monitor for these lab results and work with Dr. Basilio Cairo on planning for ct sim and radiation for this pt. Pt was grateful for the assurance we would still work on getting her radiation started as soon as possible, with this new development. She did understand this might delay planning and treatment start date. She really would like to be done by November. Rn will reach back out to the pt with feedback from Dr. Basilio Cairo.

## 2022-10-02 ENCOUNTER — Ambulatory Visit
Admission: RE | Admit: 2022-10-02 | Discharge: 2022-10-02 | Disposition: A | Discharge status: Home / Self Care | Payer: BC Managed Care – PPO | Source: Ambulatory Visit | Attending: General Surgery | Admitting: General Surgery

## 2022-10-02 DIAGNOSIS — C50211 Malignant neoplasm of upper-inner quadrant of right female breast: Secondary | ICD-10-CM

## 2022-10-02 DIAGNOSIS — Z170 Estrogen receptor positive status [ER+]: Secondary | ICD-10-CM

## 2022-10-02 DIAGNOSIS — N6310 Unspecified lump in the right breast, unspecified quadrant: Secondary | ICD-10-CM

## 2022-10-02 DIAGNOSIS — N641 Fat necrosis of breast: Secondary | ICD-10-CM | POA: Diagnosis not present

## 2022-10-02 DIAGNOSIS — N6312 Unspecified lump in the right breast, upper inner quadrant: Secondary | ICD-10-CM | POA: Diagnosis not present

## 2022-10-02 HISTORY — PX: BREAST BIOPSY: SHX20

## 2022-10-03 ENCOUNTER — Telehealth: Payer: Self-pay

## 2022-10-03 NOTE — Telephone Encounter (Signed)
Rn called pt to let her know that her pathology looked good, no signs of cancer. She was already aware of this and was pleased with the news. Rn let her pt know that we will proceed with plan for ct sim on Tuesday with original start date in place. Pt understood this conversation and will reach out with any questions or concerns.

## 2022-10-07 ENCOUNTER — Encounter: Payer: Self-pay | Admitting: *Deleted

## 2022-10-07 ENCOUNTER — Ambulatory Visit
Admission: RE | Admit: 2022-10-07 | Discharge: 2022-10-07 | Disposition: A | Discharge status: Home / Self Care | Payer: BC Managed Care – PPO | Source: Ambulatory Visit | Attending: Radiation Oncology | Admitting: Radiation Oncology

## 2022-10-07 DIAGNOSIS — F4329 Adjustment disorder with other symptoms: Secondary | ICD-10-CM | POA: Insufficient documentation

## 2022-10-07 DIAGNOSIS — Z170 Estrogen receptor positive status [ER+]: Secondary | ICD-10-CM | POA: Diagnosis not present

## 2022-10-07 DIAGNOSIS — C50211 Malignant neoplasm of upper-inner quadrant of right female breast: Secondary | ICD-10-CM | POA: Diagnosis not present

## 2022-10-08 ENCOUNTER — Encounter: Payer: Self-pay | Admitting: Hematology

## 2022-10-08 DIAGNOSIS — C50912 Malignant neoplasm of unspecified site of left female breast: Secondary | ICD-10-CM | POA: Diagnosis not present

## 2022-10-10 DIAGNOSIS — F4329 Adjustment disorder with other symptoms: Secondary | ICD-10-CM | POA: Diagnosis not present

## 2022-10-10 DIAGNOSIS — Z170 Estrogen receptor positive status [ER+]: Secondary | ICD-10-CM | POA: Diagnosis not present

## 2022-10-10 DIAGNOSIS — C50211 Malignant neoplasm of upper-inner quadrant of right female breast: Secondary | ICD-10-CM | POA: Diagnosis not present

## 2022-10-13 ENCOUNTER — Inpatient Hospital Stay: Payer: BC Managed Care – PPO | Attending: Nurse Practitioner | Admitting: Genetic Counselor

## 2022-10-13 ENCOUNTER — Ambulatory Visit
Admission: RE | Admit: 2022-10-13 | Discharge: 2022-10-13 | Disposition: A | Discharge status: Home / Self Care | Payer: BC Managed Care – PPO | Source: Ambulatory Visit | Attending: Radiation Oncology | Admitting: Radiation Oncology

## 2022-10-13 ENCOUNTER — Encounter: Payer: Self-pay | Admitting: Genetic Counselor

## 2022-10-13 ENCOUNTER — Other Ambulatory Visit: Payer: Self-pay | Admitting: Genetic Counselor

## 2022-10-13 ENCOUNTER — Inpatient Hospital Stay: Payer: BC Managed Care – PPO

## 2022-10-13 ENCOUNTER — Other Ambulatory Visit: Payer: Self-pay

## 2022-10-13 DIAGNOSIS — Z170 Estrogen receptor positive status [ER+]: Secondary | ICD-10-CM

## 2022-10-13 DIAGNOSIS — Z1379 Encounter for other screening for genetic and chromosomal anomalies: Secondary | ICD-10-CM

## 2022-10-13 DIAGNOSIS — Z8042 Family history of malignant neoplasm of prostate: Secondary | ICD-10-CM

## 2022-10-13 DIAGNOSIS — C50211 Malignant neoplasm of upper-inner quadrant of right female breast: Secondary | ICD-10-CM

## 2022-10-13 DIAGNOSIS — F4329 Adjustment disorder with other symptoms: Secondary | ICD-10-CM | POA: Diagnosis not present

## 2022-10-13 DIAGNOSIS — Z803 Family history of malignant neoplasm of breast: Secondary | ICD-10-CM

## 2022-10-13 LAB — RAD ONC ARIA SESSION SUMMARY
Course Elapsed Days: 0
Plan Fractions Treated to Date: 1
Plan Prescribed Dose Per Fraction: 1.8 Gy
Plan Total Fractions Prescribed: 14
Plan Total Prescribed Dose: 25.2 Gy
Reference Point Dosage Given to Date: 1.8 Gy
Reference Point Session Dosage Given: 1.8 Gy
Session Number: 1

## 2022-10-13 LAB — GENETIC SCREENING ORDER

## 2022-10-13 MED ORDER — RADIAPLEXRX EX GEL: Freq: Once | CUTANEOUS | Status: AC

## 2022-10-13 MED ORDER — ALRA NON-METALLIC DEODORANT (RAD-ONC)
1.0000 | Freq: Once | TOPICAL | Status: AC
  Administered 2022-10-13: 1 via TOPICAL

## 2022-10-13 NOTE — Progress Notes (Signed)
REFERRING PROVIDER: Erven Colla, PA-C [address] [place],  [place] [address]  PRIMARY PROVIDER:  Natalia Leatherwood, DO  PRIMARY REASON FOR VISIT:  Encounter Diagnoses  Name Primary?   Malignant neoplasm of upper-inner quadrant of right breast in female, estrogen receptor positive (HCC) Yes   Genetic testing    Family history of breast cancer    Family history of prostate cancer     HISTORY OF PRESENT ILLNESS:   [name], a 62 y.o. female, was seen for a [facility] cancer genetics consultation at the request of Bryan Lemma, PA-C due to a personal and family history of breast cancer.  Olivia [name] presents to clinic today to review her previous genetic testing results, to discuss updated genetic testing, and to further clarify her future cancer risks, as well as potential cancer risks for family members.   In March 2003, at the age of 45, Olivia [name] was diagnosed with invasive ductal carcinoma of the right breast (ER+/PR+/HER2-).  She was diagnosed with right invasive ductal carcinoma (ER+/PR+/HER2-) again in September 2018 at the age of 89.  The treatment plan included bilateral mastectomy and anti-estrogens.  Based on oncotype score and node negative status, she did not need adjuvant chemotherapy or adjuvant radiation.    In September 2018, she had negative [facility] panel, which included sequencing and deletion/duplication analysis of the following 23 genes: ATM, BARD1, BRCA1, BRCA2, BRIP1, CDH1, CHEK2, DICER1, EPCAM*, MLH1, MSH2, MSH6, NBN, NF1, PALB2, PMS2, PTEN, RAD50, RAD51C, RAD51D, SMARCA4, STK11, TP53.      In May 2024, at the age of 46, she was diagnosed again with right invasive ductal carcinoma (ER+/PR+/HER2-) s/p lumpectomy.  She is currently undergoing adjuvant radiation with plans for anti-estrogens.   CANCER HISTORY:  Oncology History Overview Note  Cancer Staging Malignant neoplasm of upper-inner quadrant of right breast in female, estrogen receptor positive  (HCC) Staging form: Breast, AJCC 8th Edition - Clinical stage from 10/09/2016: Stage IA (cT1c, cN0, cM0, G3, ER: Positive, PR: Positive, HER2: Negative) - Signed by Malachy Mood, MD on 10/14/2016 - Pathologic stage from 11/17/2016: Stage IB (pT2, pN0, cM0, G3, ER: Positive, PR: Positive, HER2: Negative, Oncotype DX score: 21) - Signed by Malachy Mood, MD on 12/11/2016     Malignant neoplasm of upper-inner quadrant of right breast in female, estrogen receptor positive (HCC)  10/08/2016 Mammogram   Diagnostic mammogram and ultrasound showed an irregular mass in the right breast at 1 o'clock, 5 cm from the nipple measuring 1.6 x 1.4x 1.6 cm, axilla was negative for adenopathy.   10/09/2016 Receptors her2   Estrogen Receptor: 80%, POSITIVE, STRONG STAINING INTENSITY Progesterone Receptor: 30%, POSITIVE, STRONG STAINING INTENSITY Proliferation Marker Ki67: 35% HER2 NEGATIVE    10/09/2016 Initial Diagnosis   Malignant neoplasm of upper-inner quadrant of right breast in female, estrogen receptor positive (HCC)   10/09/2016 Initial Biopsy   Diagnosis Breast, right, needle core biopsy, upper inner quadrant, 1:00 o'clock - INVASIVE DUCTAL CARCINOMA, G3   10/2016 Genetic Testing   Genetic testing was negative for The genes analyzed were the 23 genes on [facility]'s Breast/GYN panel (ATM, BARD1, BRCA1, BRCA2, BRIP1, CDH1, CHEK2, DICER1, EPCAM, MLH1,  MSH2, MSH6, NBN, NF1, PALB2, PMS2, PTEN, RAD50, RAD51C, RAD51D,SMARCA4, STK11, and TP53).     11/17/2016 Surgery   RIGHT MASTECTOMY AND LEFT PROPHYLATIC MASTECTOMY by Dr. Carolynne Edouard  IMEDITATE BREAST RECONSTRUCTION WITH PLACEMENT OF TISSUE EXPANDER AND FLEX HD (ACELLULAR HYDRATED DERMIS) by Dr. Ulice Bold  11/17/2016 Pathology Results   Diagnosis 11/17/16 1. Breast, simple mastectomy, Left - BENIGN BREAST TISSUE WITH ECTATIC DUCTS. - ONE OF ONE LYMPH NODE NEGATIVE FOR CARCINOMA (0/1). 2. Breast, simple mastectomy, Right - INVASIVE DUCTAL CARCINOMA, GRADE 3,  SPANNING 2.4 CM. - HIGH GRADE DUCTAL CARCINOMA IN SITU. - INVASIVE CARCINOMA IS <0.1 CM OF THE POSTERIOR MARGIN (BROADLY) AND ANTERIOR MARGIN (FOCALLY). - IN SITU CARCINOMA IS >0.2 CM OF BOTH MARGINS. - PRIOR LUMPECTOMY SITE, NEGATIVE FOR CARCINOMA. - SEE ONCOLOGY TABLE. 3. Breast, excision, Superior flap deep margin - BENIGN FIBROADIPOSE TISSUE.   11/17/2016 Miscellaneous   Oncotype on 11/17/16 Rcurence Risk Score of 21 10-Year risk of distant reucrrence with Tamoxifen alone is 13%    12/18/2016 - 12/30/2021 Anti-estrogen oral therapy   Adjuvant Letrozole 2.5 mg daily; began 12/18/16. Due to arthritis I switched her to Exemestane 25mg  daily in 11/2019.    12/24/2016 Imaging   BONE SCAN IMPRESSION: No evidence of osseous metastatic disease.   12/24/2016 Imaging   CT CAP IMPRESSION: 1. Status post bilateral modified radical mastectomy and breast reconstruction, as above. No findings to suggest metastatic disease in the chest, abdomen or pelvis. 2. Multiple heterogeneous appearing thyroid nodules, as discussed above. These are nonspecific but further evaluation with nonemergent thyroid ultrasound is recommended in the near future to determine any potential need for further evaluation with fine-needle aspiration. 3. Incidental findings, as above.   02/11/2017 Surgery   REMOVAL OF BILATERAL TISSUE EXPANDERS WITH PLACEMENT OF BILATERAL SILICONE BREAST IMPLANTS by Dr. Ulice Bold    06/19/2022 Imaging   R AX [place]:  Targeted ultrasound is performed, showing a 5 x 2 x 2 mm irregular hypoechoic mass right breast 1 o'clock position 3 cm from nipple at the site of palpable concern. No right axillary adenopathy.   IMPRESSION: Indeterminate palpable mass right breast 1 o'clock position.   07/01/2022 Pathology Results   Recurrence/new   Breast, right, needle core biopsy, 1 o'clock, 3cmfn, hydromark clip INVASIVE DUCTAL CARCINOMA, RECURRENT OVERALL GRADE: 2  PROGNOSTIC INDICATORS The  tumor cells are EQUIVOCAL for Her2 (2+).  Estrogen Receptor: 100%, POSITIVE, STRONG STAINING INTENSITY Progesterone Receptor: 100%, POSITIVE, STRONG STAINING INTENSITY Proliferation Marker Ki67: 20%  1A. Breast, righnt,needle core biopsy, 1 o'clock, 3 cmfn FLUORESCENCE IN-SITU HYBRIDIZATION Results: GROUP 5: HER2 **NEGATIVE**        Past Medical History:  Diagnosis Date   Adjustment insomnia 02/23/2019   Breast cancer (HCC) 2018   Lumpectomy, chemotherapy and radiation with recurrence 2018. Now on Femara   Cervical radiculopathy 07/05/2019   COVID-19 02/2019   Her husband also passed away at this time from COVID-19.   Family history of breast cancer    Genetic testing 10/30/2016   Breast/GYN panel (23 genes) @ [facility] - No pathogenic mutations detected   GERD (gastroesophageal reflux disease)    OTC medication   Grief reaction 02/23/2019   Heart murmur    as a child - Echo in early 20s "ok"   History of echocardiogram 05/2015   Echo 4/17: EF 55-60%, GLS -19.6%, normal wall motion   History of exercise stress test 05/2015   ETT 4/17 - No ST changes   HLD (hyperlipidemia)    Hypothyroidism    Multiple thyroid nodules 2009   Multiple nodules: Normal ultrasound reported at diagnosis   Osteoporosis    Personal history of chemotherapy    Personal history of radiation therapy    UNEQUAL LEG LENGTH, ACQUIRED 11/17/2006   Qualifier: Diagnosis of  By:  FIELDS MD, KARL      Past Surgical History:  Procedure Laterality Date   ABDOMINAL HYSTERECTOMY     BREAST BIOPSY Right 07/01/2022   [place] RT BREAST BX W LOC DEV 1ST LESION IMG BX SPEC US GUIDE 07/01/2022 [facility]   BREAST BIOPSY  08/15/2022   [place] RT RADIOACTIVE SEED LOC 08/15/2022 [facility]   BREAST BIOPSY Right 10/02/2022   [place] RT BREAST BX W LOC DEV 1ST LESION IMG BX SPEC US GUIDE 10/02/2022 [facility]   BREAST LUMPECTOMY Right 2003   BREAST LUMPECTOMY WITH RADIOACTIVE SEED LOCALIZATION Right 08/18/2022    Procedure: RIGHT BREAST LUMPECTOMY WITH RADIOACTIVE SEED LOCALIZATION;  Surgeon: Griselda Miner, MD;  Location: [facility];  Service: General;  Laterality: Right;   BREAST RECONSTRUCTION WITH PLACEMENT OF TISSUE EXPANDER AND FLEX HD (ACELLULAR HYDRATED DERMIS) Bilateral 11/17/2016   Procedure: IMEDITATE BREAST RECONSTRUCTION WITH PLACEMENT OF TISSUE EXPANDER AND FLEX HD (ACELLULAR HYDRATED DERMIS);  Surgeon: Peggye Form, DO;  Location: [facility];  Service: Plastics;  Laterality: Bilateral;   BREAST SURGERY  2003 /right   lumpectomy; chemotherapy and radiation   LIPOSUCTION WITH LIPOFILLING Bilateral 02/23/2020   Procedure: Fat filling of breasts for symmetry from abdomen;  Surgeon: Peggye Form, DO;  Location: [facility];  Service: Plastics;  Laterality: Bilateral;  90 min, please   LIPOSUCTION WITH LIPOFILLING Bilateral 05/16/2021   Procedure: LIPOSUCTION WITH LIPOFILLING;  Surgeon: Peggye Form, DO;  Location: [facility];  Service: Plastics;  Laterality: Bilateral;  1.5 hours   REMOVAL OF BILATERAL TISSUE EXPANDERS WITH PLACEMENT OF BILATERAL BREAST IMPLANTS Bilateral 02/11/2017   Procedure: REMOVAL OF BILATERAL TISSUE EXPANDERS WITH PLACEMENT OF BILATERAL SILICONE BREAST IMPLANTS;  Surgeon: Peggye Form, DO;  Location:  SURGERY CENTER;  Service: Plastics;  Laterality: Bilateral;   TOTAL ABDOMINAL HYSTERECTOMY W/ BILATERAL SALPINGOOPHORECTOMY     for treatment of breast treatment   TOTAL MASTECTOMY Bilateral 11/17/2016   Procedure: RIGHT MASTECTOMY AND LEFT PROPHYLATIC MASTECTOMY;  Surgeon: Griselda Miner, MD;  Location: [facility];  Service: General;  Laterality: Bilateral;    FAMILY HISTORY:  We obtained a detailed, 4-generation family history.  Significant diagnoses are listed below: Family History  Problem Relation Age of Onset   Skin cancer Mother        unk type   Prostate cancer Father 19       deceased 9    Cancer Brother 76       multiple myeloma    Lung cancer Maternal Uncle        x2 mat uncles; dx 16s-70s   Breast cancer Paternal Aunt 17   Breast cancer Cousin        x2 pat female cousins; dx 73s   Esophageal cancer Cousin 33       pat female cousin      Ms. Kehrer is unaware of previous family history of genetic testing for hereditary cancer risks.   There is no reported Ashkenazi Jewish ancestry. There is no known consanguinity.  GENETIC COUNSELING ASSESSMENT: Ms. Kirschenman is a 62 y.o. female with a personal and family history of breast cancer and previously negative genetic testing. We, therefore, discussed and recommended the following at today's visit.   DISCUSSION:   We discussed that, in general, most cancer is not inherited in families, but instead is sporadic or familial. Sporadic cancers occur by chance and typically happen at older ages (>50 years) as this  type of cancer is caused by genetic changes acquired during an individual's lifetime. Some families have more cancers than would be expected by chance; however, the ages or types of cancer are not consistent with a known genetic mutation or known genetic mutations have been ruled out. This type of familial cancer is thought to be due to a combination of multiple genetic, environmental, hormonal, and lifestyle factors. While this combination of factors likely increases the risk of cancer, the exact source of this risk is not currently identifiable or testable.    We discussed that approximately 5-10% of cancer is hereditary, meaning that it is due to a mutation in a single gene that is passed down from generation to generation in a family.  We reviewed her negative hereditary cancer genetic testing results from September 2018.    Even though a pathogenic variant was not identified in her 2018 testing, possible explanations for the cancer in the family may include: There may be no hereditary risk for cancer in the family. The  cancers in Ms. Casassa and her family may be familial or due to other genetic and environmental factors.   There may be a gene mutation in one of these genes that methods could not detected in 2018.  There could be another gene that has not yet been discovered, or that we have not yet tested, that is responsible for the cancer diagnoses in the family.  It is also possible there is a hereditary cause for the cancer in the family that Ms. Eccleston did not inherit.  ADDITIONAL GENETIC TESTING:  We reviewed that her genetic testing from 2018 included all appropriate breast cancer genes.  Her analysis from 2018 did not include RNA analysis.  We discussed that in rare instances, deep intronic variants can be detected by RNA analysis that would have been missed by DNA analysis alone. Furthermore, given her father's history of prostate cancer, it is appropriate to include analysis of the HOXB13 gene, which increases risk for prostate cancer in males. Updated genetic testing is available if she is interested.   Given her extensive history of breast cancer (dx at age 31, 28, and 59) and the paternal family history of breast and prostate cancer, updated genetic testing is reasonable.  Ms. Harville was interested in updated genetic testing, and we recommended she have testing for breast, prostate, skin, and other cancer genes.  We reviewed possible the implications of positive, negative, and uncertain results.   The CancerNext-Expanded gene panel offered by [facility] and includes sequencing, rearrangement, and RNA analysis for the following 71 genes:  AIP, ALK, APC, ATM, BAP1, BARD1, BMPR1A, BRCA1, BRCA2, BRIP1, CDC73, CDH1, CDK4, CDKN1B, CDKN2A, CHEK2, DICER1, FH, FLCN, KIF1B, LZTR1,MAX, MEN1, MET, MLH1, MSH2, MSH6, MUTYH, NF1, NF2, NTHL1, PALB2, PHOX2B, PMS2, POT1, PRKAR1A, PTCH1, PTEN, RAD51C,RAD51D, RB1, RET, SDHA, SDHAF2, SDHB, SDHC, SDHD, SMAD4, SMARCA4, SMARCB1, SMARCE1, STK11, SUFU, TMEM127, TP53,TSC1, TSC2  and VHL (sequencing and deletion/duplication); AXIN2, CTNNA1, EGFR, EGLN1, HOXB13, KIT, MITF, MSH3, PDGFRA, POLD1 and POLE (sequencing only); EPCAM and GREM1 (deletion/duplication only).   We discussed that if she has an out of pocket cost that is over $100, the laboratory will reach out to discuss patient assistance programs and the self-pay option of $250.    PLAN: After considering the risks, benefits, and limitations, Ms. Merendino provided informed consent to pursue genetic testing and the blood sample was sent to [facility] for analysis of the CancerNext-Expanded +RNAinsight Panel. Results should be available within approximately 3 weeks'  time, at which point they will be disclosed by telephone to Ms. Lamartina, as will any additional recommendations warranted by these results. Ms. Schifano will receive a summary of her genetic counseling visit and a copy of her results once available. This information will also be available in Epic.   Based on Ms. Georg's family history, we recommended her paternal cousins, who were diagnosed with breast cancer, have genetic counseling and testing.   Ms. Waters questions were answered to her satisfaction today. Our contact information was provided should additional questions or concerns arise. Thank you for the referral and allowing [place] to share in the care of your patient.   Alpha Mysliwiec M. Rennie Plowman, MS, [facility] Genetic Counselor [email or web address] (P) [phone]  The patient was seen for less than 15 minutes of face to face genetic counseling.

## 2022-10-14 ENCOUNTER — Ambulatory Visit
Admission: RE | Admit: 2022-10-14 | Discharge: 2022-10-14 | Disposition: A | Discharge status: Home / Self Care | Payer: BC Managed Care – PPO | Source: Ambulatory Visit | Attending: Radiation Oncology | Admitting: Radiation Oncology

## 2022-10-14 ENCOUNTER — Other Ambulatory Visit: Payer: Self-pay

## 2022-10-14 DIAGNOSIS — F4329 Adjustment disorder with other symptoms: Secondary | ICD-10-CM | POA: Diagnosis not present

## 2022-10-14 DIAGNOSIS — Z510 Encounter for antineoplastic radiation therapy: Secondary | ICD-10-CM | POA: Diagnosis not present

## 2022-10-14 DIAGNOSIS — C50211 Malignant neoplasm of upper-inner quadrant of right female breast: Secondary | ICD-10-CM | POA: Diagnosis not present

## 2022-10-14 DIAGNOSIS — Z170 Estrogen receptor positive status [ER+]: Secondary | ICD-10-CM | POA: Diagnosis not present

## 2022-10-14 LAB — RAD ONC ARIA SESSION SUMMARY
Course Elapsed Days: 1
Plan Fractions Treated to Date: 1
Plan Prescribed Dose Per Fraction: 1.8 Gy
Plan Total Fractions Prescribed: 14
Plan Total Prescribed Dose: 25.2 Gy
Reference Point Dosage Given to Date: 1.8 Gy
Reference Point Session Dosage Given: 1.8 Gy
Session Number: 2

## 2022-10-15 ENCOUNTER — Other Ambulatory Visit: Payer: Self-pay

## 2022-10-15 ENCOUNTER — Ambulatory Visit
Admission: RE | Admit: 2022-10-15 | Discharge: 2022-10-15 | Disposition: A | Discharge status: Home / Self Care | Payer: BC Managed Care – PPO | Source: Ambulatory Visit | Attending: Radiation Oncology | Admitting: Radiation Oncology

## 2022-10-15 DIAGNOSIS — F4329 Adjustment disorder with other symptoms: Secondary | ICD-10-CM | POA: Diagnosis not present

## 2022-10-15 DIAGNOSIS — Z170 Estrogen receptor positive status [ER+]: Secondary | ICD-10-CM | POA: Diagnosis not present

## 2022-10-15 DIAGNOSIS — C50211 Malignant neoplasm of upper-inner quadrant of right female breast: Secondary | ICD-10-CM | POA: Diagnosis not present

## 2022-10-15 LAB — RAD ONC ARIA SESSION SUMMARY
Course Elapsed Days: 2
Plan Fractions Treated to Date: 2
Plan Prescribed Dose Per Fraction: 1.8 Gy
Plan Total Fractions Prescribed: 14
Plan Total Prescribed Dose: 25.2 Gy
Reference Point Dosage Given to Date: 3.6 Gy
Reference Point Session Dosage Given: 1.8 Gy
Session Number: 3

## 2022-10-16 ENCOUNTER — Other Ambulatory Visit: Payer: Self-pay

## 2022-10-16 ENCOUNTER — Ambulatory Visit
Admission: RE | Admit: 2022-10-16 | Discharge: 2022-10-16 | Disposition: A | Discharge status: Home / Self Care | Payer: BC Managed Care – PPO | Source: Ambulatory Visit | Attending: Radiation Oncology | Admitting: Radiation Oncology

## 2022-10-16 DIAGNOSIS — Z510 Encounter for antineoplastic radiation therapy: Secondary | ICD-10-CM | POA: Diagnosis not present

## 2022-10-16 DIAGNOSIS — Z170 Estrogen receptor positive status [ER+]: Secondary | ICD-10-CM | POA: Diagnosis not present

## 2022-10-16 DIAGNOSIS — C50211 Malignant neoplasm of upper-inner quadrant of right female breast: Secondary | ICD-10-CM | POA: Diagnosis not present

## 2022-10-16 DIAGNOSIS — F4329 Adjustment disorder with other symptoms: Secondary | ICD-10-CM | POA: Diagnosis not present

## 2022-10-16 LAB — RAD ONC ARIA SESSION SUMMARY
Course Elapsed Days: 3
Plan Fractions Treated to Date: 2
Plan Prescribed Dose Per Fraction: 1.8 Gy
Plan Total Fractions Prescribed: 14
Plan Total Prescribed Dose: 25.2 Gy
Reference Point Dosage Given to Date: 3.6 Gy
Reference Point Session Dosage Given: 1.8 Gy
Session Number: 4

## 2022-10-17 ENCOUNTER — Ambulatory Visit
Admission: RE | Admit: 2022-10-17 | Discharge: 2022-10-17 | Disposition: A | Discharge status: Home / Self Care | Payer: BC Managed Care – PPO | Source: Ambulatory Visit | Attending: Radiation Oncology | Admitting: Radiation Oncology

## 2022-10-17 ENCOUNTER — Other Ambulatory Visit: Payer: Self-pay

## 2022-10-17 DIAGNOSIS — Z510 Encounter for antineoplastic radiation therapy: Secondary | ICD-10-CM | POA: Diagnosis not present

## 2022-10-17 DIAGNOSIS — F4329 Adjustment disorder with other symptoms: Secondary | ICD-10-CM | POA: Diagnosis not present

## 2022-10-17 DIAGNOSIS — C50211 Malignant neoplasm of upper-inner quadrant of right female breast: Secondary | ICD-10-CM | POA: Diagnosis not present

## 2022-10-17 DIAGNOSIS — Z170 Estrogen receptor positive status [ER+]: Secondary | ICD-10-CM | POA: Diagnosis not present

## 2022-10-17 LAB — RAD ONC ARIA SESSION SUMMARY
Course Elapsed Days: 4
Plan Fractions Treated to Date: 3
Plan Prescribed Dose Per Fraction: 1.8 Gy
Plan Total Fractions Prescribed: 14
Plan Total Prescribed Dose: 25.2 Gy
Reference Point Dosage Given to Date: 5.4 Gy
Reference Point Session Dosage Given: 1.8 Gy
Session Number: 5

## 2022-10-20 ENCOUNTER — Ambulatory Visit
Admission: RE | Admit: 2022-10-20 | Discharge: 2022-10-20 | Disposition: A | Discharge status: Home / Self Care | Payer: BC Managed Care – PPO | Source: Ambulatory Visit | Attending: Radiation Oncology | Admitting: Radiation Oncology

## 2022-10-20 ENCOUNTER — Other Ambulatory Visit: Payer: Self-pay

## 2022-10-20 DIAGNOSIS — Z170 Estrogen receptor positive status [ER+]: Secondary | ICD-10-CM | POA: Diagnosis not present

## 2022-10-20 DIAGNOSIS — F4329 Adjustment disorder with other symptoms: Secondary | ICD-10-CM | POA: Diagnosis not present

## 2022-10-20 DIAGNOSIS — Z510 Encounter for antineoplastic radiation therapy: Secondary | ICD-10-CM | POA: Diagnosis not present

## 2022-10-20 DIAGNOSIS — C50211 Malignant neoplasm of upper-inner quadrant of right female breast: Secondary | ICD-10-CM | POA: Diagnosis not present

## 2022-10-20 LAB — RAD ONC ARIA SESSION SUMMARY
Course Elapsed Days: 7
Plan Fractions Treated to Date: 3
Plan Prescribed Dose Per Fraction: 1.8 Gy
Plan Total Fractions Prescribed: 14
Plan Total Prescribed Dose: 25.2 Gy
Reference Point Dosage Given to Date: 5.4 Gy
Reference Point Session Dosage Given: 1.8 Gy
Session Number: 6

## 2022-10-21 ENCOUNTER — Ambulatory Visit
Admission: RE | Admit: 2022-10-21 | Discharge: 2022-10-21 | Disposition: A | Discharge status: Home / Self Care | Payer: BC Managed Care – PPO | Source: Ambulatory Visit | Attending: Radiation Oncology | Admitting: Radiation Oncology

## 2022-10-21 ENCOUNTER — Other Ambulatory Visit: Payer: Self-pay

## 2022-10-21 DIAGNOSIS — F4329 Adjustment disorder with other symptoms: Secondary | ICD-10-CM | POA: Diagnosis not present

## 2022-10-21 DIAGNOSIS — Z510 Encounter for antineoplastic radiation therapy: Secondary | ICD-10-CM | POA: Diagnosis not present

## 2022-10-21 DIAGNOSIS — C50211 Malignant neoplasm of upper-inner quadrant of right female breast: Secondary | ICD-10-CM | POA: Diagnosis not present

## 2022-10-21 DIAGNOSIS — Z170 Estrogen receptor positive status [ER+]: Secondary | ICD-10-CM | POA: Diagnosis not present

## 2022-10-21 LAB — RAD ONC ARIA SESSION SUMMARY
Course Elapsed Days: 8
Plan Fractions Treated to Date: 4
Plan Prescribed Dose Per Fraction: 1.8 Gy
Plan Total Fractions Prescribed: 14
Plan Total Prescribed Dose: 25.2 Gy
Reference Point Dosage Given to Date: 7.2 Gy
Reference Point Session Dosage Given: 1.8 Gy
Session Number: 7

## 2022-10-22 ENCOUNTER — Other Ambulatory Visit: Payer: Self-pay

## 2022-10-22 ENCOUNTER — Ambulatory Visit
Admission: RE | Admit: 2022-10-22 | Discharge: 2022-10-22 | Disposition: A | Discharge status: Home / Self Care | Payer: BC Managed Care – PPO | Source: Ambulatory Visit | Attending: Radiation Oncology | Admitting: Radiation Oncology

## 2022-10-22 DIAGNOSIS — F4329 Adjustment disorder with other symptoms: Secondary | ICD-10-CM | POA: Diagnosis not present

## 2022-10-22 DIAGNOSIS — Z510 Encounter for antineoplastic radiation therapy: Secondary | ICD-10-CM | POA: Diagnosis not present

## 2022-10-22 DIAGNOSIS — Z170 Estrogen receptor positive status [ER+]: Secondary | ICD-10-CM | POA: Diagnosis not present

## 2022-10-22 DIAGNOSIS — C50211 Malignant neoplasm of upper-inner quadrant of right female breast: Secondary | ICD-10-CM | POA: Diagnosis not present

## 2022-10-22 LAB — RAD ONC ARIA SESSION SUMMARY
Course Elapsed Days: 9
Plan Fractions Treated to Date: 4
Plan Prescribed Dose Per Fraction: 1.8 Gy
Plan Total Fractions Prescribed: 14
Plan Total Prescribed Dose: 25.2 Gy
Reference Point Dosage Given to Date: 7.2 Gy
Reference Point Session Dosage Given: 1.8 Gy
Session Number: 8

## 2022-10-23 ENCOUNTER — Other Ambulatory Visit: Payer: Self-pay

## 2022-10-23 ENCOUNTER — Ambulatory Visit
Admission: RE | Admit: 2022-10-23 | Discharge: 2022-10-23 | Disposition: A | Discharge status: Home / Self Care | Payer: BC Managed Care – PPO | Source: Ambulatory Visit | Attending: Radiation Oncology | Admitting: Radiation Oncology

## 2022-10-23 DIAGNOSIS — F4329 Adjustment disorder with other symptoms: Secondary | ICD-10-CM | POA: Diagnosis not present

## 2022-10-23 DIAGNOSIS — C50211 Malignant neoplasm of upper-inner quadrant of right female breast: Secondary | ICD-10-CM | POA: Diagnosis not present

## 2022-10-23 DIAGNOSIS — Z510 Encounter for antineoplastic radiation therapy: Secondary | ICD-10-CM | POA: Diagnosis not present

## 2022-10-23 DIAGNOSIS — Z170 Estrogen receptor positive status [ER+]: Secondary | ICD-10-CM | POA: Diagnosis not present

## 2022-10-23 LAB — RAD ONC ARIA SESSION SUMMARY
Course Elapsed Days: 10
Plan Fractions Treated to Date: 5
Plan Prescribed Dose Per Fraction: 1.8 Gy
Plan Total Fractions Prescribed: 14
Plan Total Prescribed Dose: 25.2 Gy
Reference Point Dosage Given to Date: 9 Gy
Reference Point Session Dosage Given: 1.8 Gy
Session Number: 9

## 2022-10-24 ENCOUNTER — Other Ambulatory Visit: Payer: Self-pay

## 2022-10-24 ENCOUNTER — Ambulatory Visit
Admission: RE | Admit: 2022-10-24 | Discharge: 2022-10-24 | Disposition: A | Discharge status: Home / Self Care | Payer: BC Managed Care – PPO | Source: Ambulatory Visit | Attending: Radiation Oncology

## 2022-10-24 DIAGNOSIS — C50211 Malignant neoplasm of upper-inner quadrant of right female breast: Secondary | ICD-10-CM | POA: Diagnosis not present

## 2022-10-24 DIAGNOSIS — Z170 Estrogen receptor positive status [ER+]: Secondary | ICD-10-CM | POA: Diagnosis not present

## 2022-10-24 DIAGNOSIS — F4329 Adjustment disorder with other symptoms: Secondary | ICD-10-CM | POA: Diagnosis not present

## 2022-10-24 DIAGNOSIS — Z510 Encounter for antineoplastic radiation therapy: Secondary | ICD-10-CM | POA: Diagnosis not present

## 2022-10-24 LAB — RAD ONC ARIA SESSION SUMMARY
Course Elapsed Days: 11
Plan Fractions Treated to Date: 5
Plan Prescribed Dose Per Fraction: 1.8 Gy
Plan Total Fractions Prescribed: 14
Plan Total Prescribed Dose: 25.2 Gy
Reference Point Dosage Given to Date: 9 Gy
Reference Point Session Dosage Given: 1.8 Gy
Session Number: 10

## 2022-10-27 ENCOUNTER — Other Ambulatory Visit: Payer: Self-pay

## 2022-10-27 ENCOUNTER — Ambulatory Visit
Admission: RE | Admit: 2022-10-27 | Discharge: 2022-10-27 | Disposition: A | Discharge status: Home / Self Care | Payer: BC Managed Care – PPO | Source: Ambulatory Visit | Attending: Radiation Oncology | Admitting: Radiation Oncology

## 2022-10-27 DIAGNOSIS — Z510 Encounter for antineoplastic radiation therapy: Secondary | ICD-10-CM | POA: Diagnosis not present

## 2022-10-27 DIAGNOSIS — F4329 Adjustment disorder with other symptoms: Secondary | ICD-10-CM | POA: Diagnosis not present

## 2022-10-27 DIAGNOSIS — Z170 Estrogen receptor positive status [ER+]: Secondary | ICD-10-CM | POA: Diagnosis not present

## 2022-10-27 DIAGNOSIS — C50211 Malignant neoplasm of upper-inner quadrant of right female breast: Secondary | ICD-10-CM | POA: Diagnosis not present

## 2022-10-27 LAB — RAD ONC ARIA SESSION SUMMARY
Course Elapsed Days: 14
Plan Fractions Treated to Date: 6
Plan Prescribed Dose Per Fraction: 1.8 Gy
Plan Total Fractions Prescribed: 14
Plan Total Prescribed Dose: 25.2 Gy
Reference Point Dosage Given to Date: 10.8 Gy
Reference Point Session Dosage Given: 1.8 Gy
Session Number: 11

## 2022-10-28 ENCOUNTER — Other Ambulatory Visit: Payer: Self-pay

## 2022-10-28 ENCOUNTER — Ambulatory Visit
Admission: RE | Admit: 2022-10-28 | Discharge: 2022-10-28 | Disposition: A | Discharge status: Home / Self Care | Payer: BC Managed Care – PPO | Source: Ambulatory Visit | Attending: Radiation Oncology | Admitting: Radiation Oncology

## 2022-10-28 ENCOUNTER — Other Ambulatory Visit: Payer: Self-pay | Admitting: Genetic Counselor

## 2022-10-28 ENCOUNTER — Telehealth: Payer: Self-pay | Admitting: Genetic Counselor

## 2022-10-28 DIAGNOSIS — Z170 Estrogen receptor positive status [ER+]: Secondary | ICD-10-CM

## 2022-10-28 DIAGNOSIS — F4329 Adjustment disorder with other symptoms: Secondary | ICD-10-CM | POA: Diagnosis not present

## 2022-10-28 DIAGNOSIS — C50211 Malignant neoplasm of upper-inner quadrant of right female breast: Secondary | ICD-10-CM | POA: Diagnosis not present

## 2022-10-28 DIAGNOSIS — Z510 Encounter for antineoplastic radiation therapy: Secondary | ICD-10-CM | POA: Diagnosis not present

## 2022-10-28 LAB — RAD ONC ARIA SESSION SUMMARY
Course Elapsed Days: 15
Plan Fractions Treated to Date: 6
Plan Prescribed Dose Per Fraction: 1.8 Gy
Plan Total Fractions Prescribed: 14
Plan Total Prescribed Dose: 25.2 Gy
Reference Point Dosage Given to Date: 10.8 Gy
Reference Point Session Dosage Given: 1.8 Gy
Session Number: 12

## 2022-10-28 NOTE — Telephone Encounter (Signed)
Called patient to inform her of negative genetic testing.  DNA analysis was only able to be performed by lab given partial sample failure.  Recommended re-draw of Ambry Kit so that RNA analyiss can be performed. Patient agreed.  Lab re-draw scheduled for 9/25 at 1:30pm.

## 2022-10-29 ENCOUNTER — Ambulatory Visit
Admission: RE | Admit: 2022-10-29 | Discharge: 2022-10-29 | Disposition: A | Discharge status: Home / Self Care | Payer: BC Managed Care – PPO | Source: Ambulatory Visit | Attending: Radiation Oncology

## 2022-10-29 ENCOUNTER — Inpatient Hospital Stay: Payer: BC Managed Care – PPO

## 2022-10-29 ENCOUNTER — Other Ambulatory Visit: Payer: Self-pay

## 2022-10-29 DIAGNOSIS — F4329 Adjustment disorder with other symptoms: Secondary | ICD-10-CM | POA: Diagnosis not present

## 2022-10-29 DIAGNOSIS — Z170 Estrogen receptor positive status [ER+]: Secondary | ICD-10-CM

## 2022-10-29 DIAGNOSIS — C50211 Malignant neoplasm of upper-inner quadrant of right female breast: Secondary | ICD-10-CM | POA: Diagnosis not present

## 2022-10-29 DIAGNOSIS — Z510 Encounter for antineoplastic radiation therapy: Secondary | ICD-10-CM | POA: Diagnosis not present

## 2022-10-29 LAB — RAD ONC ARIA SESSION SUMMARY
Course Elapsed Days: 16
Plan Fractions Treated to Date: 7
Plan Prescribed Dose Per Fraction: 1.8 Gy
Plan Total Fractions Prescribed: 14
Plan Total Prescribed Dose: 25.2 Gy
Reference Point Dosage Given to Date: 12.6 Gy
Reference Point Session Dosage Given: 1.8 Gy
Session Number: 13

## 2022-10-29 LAB — GENETIC SCREENING ORDER

## 2022-10-30 ENCOUNTER — Ambulatory Visit
Admission: RE | Admit: 2022-10-30 | Discharge: 2022-10-30 | Disposition: A | Discharge status: Home / Self Care | Payer: BC Managed Care – PPO | Source: Ambulatory Visit | Attending: Radiation Oncology | Admitting: Radiation Oncology

## 2022-10-30 ENCOUNTER — Ambulatory Visit (INDEPENDENT_AMBULATORY_CARE_PROVIDER_SITE_OTHER): Payer: Self-pay | Admitting: Surgical

## 2022-10-30 ENCOUNTER — Other Ambulatory Visit: Payer: Self-pay

## 2022-10-30 DIAGNOSIS — S0993XD Unspecified injury of face, subsequent encounter: Secondary | ICD-10-CM

## 2022-10-30 DIAGNOSIS — C50211 Malignant neoplasm of upper-inner quadrant of right female breast: Secondary | ICD-10-CM | POA: Diagnosis not present

## 2022-10-30 DIAGNOSIS — Z510 Encounter for antineoplastic radiation therapy: Secondary | ICD-10-CM | POA: Diagnosis not present

## 2022-10-30 DIAGNOSIS — Z170 Estrogen receptor positive status [ER+]: Secondary | ICD-10-CM | POA: Diagnosis not present

## 2022-10-30 DIAGNOSIS — F4329 Adjustment disorder with other symptoms: Secondary | ICD-10-CM | POA: Diagnosis not present

## 2022-10-30 DIAGNOSIS — Z719 Counseling, unspecified: Secondary | ICD-10-CM

## 2022-10-30 LAB — RAD ONC ARIA SESSION SUMMARY
Course Elapsed Days: 17
Plan Fractions Treated to Date: 7
Plan Prescribed Dose Per Fraction: 1.8 Gy
Plan Total Fractions Prescribed: 14
Plan Total Prescribed Dose: 25.2 Gy
Reference Point Dosage Given to Date: 12.6 Gy
Reference Point Session Dosage Given: 1.8 Gy
Session Number: 14

## 2022-10-30 NOTE — Progress Notes (Signed)
Botulinum Toxin Procedure Note  Procedure: Cosmetic botulinum toxin  Pre-operative Diagnosis: Dynamic rhytides  Post-operative Diagnosis: Same  Complications:  None  Brief history: The patient desires botulinum toxin injection.  She is aware of the risks including bleeding, damage to deeper structures, asymmetry, brow ptosis, eyelid ptosis, bruising. The patient understands and wishes to proceed.  She reports she has had Botox here in the past, she does not recall the amount of Botox used.  Patient does have a history of facial trauma and on exam she has an indentation over the left central forehead just above the glabella.  This appears to be where her muscle sheared.  Procedure: The area was prepped with alcohol and dried with a clean gauze.  Using a clean technique the botulinum toxin was diluted with 2.5 mL of bacteriostatic saline per 100 unit vial which resulted in 4 units per 0.1 mL.  Subsequently the mixture was injected in the glabellar and forehead area with preservation of the temporal branch to the lateral eyebrow. A total of 18 Units of botulinum toxin was used. The forehead and glabellar area was injected with care to inject intramuscular only while holding pressure on the supratrochlear vessels in each area during each injection on either side of the medial corrugators. The injection proceeded vertically superiorly to the medial 2/3 of the frontalis muscle and superior 2/3 of the lateral frontalis, again with preservation of the frontal branch.  No complications were noted. Light pressure was held for 5 minutes. She was instructed explicitly in post-operative care.  Botox LOT:  [id number]  EXP:  10/2024

## 2022-10-31 ENCOUNTER — Ambulatory Visit: Payer: BC Managed Care – PPO

## 2022-10-31 ENCOUNTER — Ambulatory Visit (INDEPENDENT_AMBULATORY_CARE_PROVIDER_SITE_OTHER): Payer: BC Managed Care – PPO

## 2022-10-31 ENCOUNTER — Other Ambulatory Visit: Payer: Self-pay

## 2022-10-31 ENCOUNTER — Ambulatory Visit
Admission: RE | Admit: 2022-10-31 | Discharge: 2022-10-31 | Disposition: A | Discharge status: Home / Self Care | Payer: BC Managed Care – PPO | Source: Ambulatory Visit | Attending: Radiation Oncology | Admitting: Radiation Oncology

## 2022-10-31 DIAGNOSIS — C50211 Malignant neoplasm of upper-inner quadrant of right female breast: Secondary | ICD-10-CM | POA: Diagnosis not present

## 2022-10-31 DIAGNOSIS — Z23 Encounter for immunization: Secondary | ICD-10-CM

## 2022-10-31 DIAGNOSIS — Z510 Encounter for antineoplastic radiation therapy: Secondary | ICD-10-CM | POA: Diagnosis not present

## 2022-10-31 DIAGNOSIS — Z170 Estrogen receptor positive status [ER+]: Secondary | ICD-10-CM | POA: Diagnosis not present

## 2022-10-31 DIAGNOSIS — F4329 Adjustment disorder with other symptoms: Secondary | ICD-10-CM | POA: Diagnosis not present

## 2022-10-31 LAB — RAD ONC ARIA SESSION SUMMARY
Course Elapsed Days: 18
Plan Fractions Treated to Date: 8
Plan Prescribed Dose Per Fraction: 1.8 Gy
Plan Total Fractions Prescribed: 14
Plan Total Prescribed Dose: 25.2 Gy
Reference Point Dosage Given to Date: 14.4 Gy
Reference Point Session Dosage Given: 1.8 Gy
Session Number: 15

## 2022-10-31 NOTE — Progress Notes (Signed)
Pt in for regular  flu shot per Dr. Claiborne Billings.  Injection tolerated well.

## 2022-11-03 ENCOUNTER — Ambulatory Visit
Admission: RE | Admit: 2022-11-03 | Discharge: 2022-11-03 | Disposition: A | Discharge status: Home / Self Care | Payer: BC Managed Care – PPO | Source: Ambulatory Visit | Attending: Radiation Oncology | Admitting: Radiation Oncology

## 2022-11-03 ENCOUNTER — Other Ambulatory Visit: Payer: Self-pay

## 2022-11-03 DIAGNOSIS — Z170 Estrogen receptor positive status [ER+]: Secondary | ICD-10-CM | POA: Diagnosis not present

## 2022-11-03 DIAGNOSIS — C50211 Malignant neoplasm of upper-inner quadrant of right female breast: Secondary | ICD-10-CM | POA: Diagnosis not present

## 2022-11-03 DIAGNOSIS — Z510 Encounter for antineoplastic radiation therapy: Secondary | ICD-10-CM | POA: Diagnosis not present

## 2022-11-03 DIAGNOSIS — F4329 Adjustment disorder with other symptoms: Secondary | ICD-10-CM | POA: Diagnosis not present

## 2022-11-03 LAB — RAD ONC ARIA SESSION SUMMARY
Course Elapsed Days: 21
Plan Fractions Treated to Date: 8
Plan Prescribed Dose Per Fraction: 1.8 Gy
Plan Total Fractions Prescribed: 14
Plan Total Prescribed Dose: 25.2 Gy
Reference Point Dosage Given to Date: 14.4 Gy
Reference Point Session Dosage Given: 1.8 Gy
Session Number: 16

## 2022-11-04 ENCOUNTER — Other Ambulatory Visit: Payer: Self-pay

## 2022-11-04 ENCOUNTER — Ambulatory Visit
Admission: RE | Admit: 2022-11-04 | Discharge: 2022-11-04 | Disposition: A | Discharge status: Home / Self Care | Payer: BC Managed Care – PPO | Source: Ambulatory Visit | Attending: Radiation Oncology | Admitting: Radiation Oncology

## 2022-11-04 DIAGNOSIS — F4329 Adjustment disorder with other symptoms: Secondary | ICD-10-CM | POA: Diagnosis not present

## 2022-11-04 DIAGNOSIS — Z510 Encounter for antineoplastic radiation therapy: Secondary | ICD-10-CM | POA: Diagnosis not present

## 2022-11-04 DIAGNOSIS — C50211 Malignant neoplasm of upper-inner quadrant of right female breast: Secondary | ICD-10-CM | POA: Diagnosis not present

## 2022-11-04 DIAGNOSIS — Z170 Estrogen receptor positive status [ER+]: Secondary | ICD-10-CM | POA: Insufficient documentation

## 2022-11-04 LAB — RAD ONC ARIA SESSION SUMMARY
Course Elapsed Days: 22
Plan Fractions Treated to Date: 9
Plan Prescribed Dose Per Fraction: 1.8 Gy
Plan Total Fractions Prescribed: 14
Plan Total Prescribed Dose: 25.2 Gy
Reference Point Dosage Given to Date: 16.2 Gy
Reference Point Session Dosage Given: 1.8 Gy
Session Number: 17

## 2022-11-05 ENCOUNTER — Other Ambulatory Visit: Payer: Self-pay

## 2022-11-05 ENCOUNTER — Ambulatory Visit
Admission: RE | Admit: 2022-11-05 | Discharge: 2022-11-05 | Disposition: A | Discharge status: Home / Self Care | Payer: BC Managed Care – PPO | Source: Ambulatory Visit | Attending: Radiation Oncology | Admitting: Radiation Oncology

## 2022-11-05 DIAGNOSIS — Z510 Encounter for antineoplastic radiation therapy: Secondary | ICD-10-CM | POA: Diagnosis not present

## 2022-11-05 DIAGNOSIS — C50211 Malignant neoplasm of upper-inner quadrant of right female breast: Secondary | ICD-10-CM | POA: Diagnosis not present

## 2022-11-05 DIAGNOSIS — Z170 Estrogen receptor positive status [ER+]: Secondary | ICD-10-CM | POA: Diagnosis not present

## 2022-11-05 DIAGNOSIS — F4329 Adjustment disorder with other symptoms: Secondary | ICD-10-CM | POA: Diagnosis not present

## 2022-11-05 LAB — RAD ONC ARIA SESSION SUMMARY
Course Elapsed Days: 23
Plan Fractions Treated to Date: 9
Plan Prescribed Dose Per Fraction: 1.8 Gy
Plan Total Fractions Prescribed: 14
Plan Total Prescribed Dose: 25.2 Gy
Reference Point Dosage Given to Date: 16.2 Gy
Reference Point Session Dosage Given: 1.8 Gy
Session Number: 18

## 2022-11-06 ENCOUNTER — Other Ambulatory Visit: Payer: Self-pay

## 2022-11-06 ENCOUNTER — Ambulatory Visit
Admission: RE | Admit: 2022-11-06 | Discharge: 2022-11-06 | Disposition: A | Discharge status: Home / Self Care | Payer: BC Managed Care – PPO | Source: Ambulatory Visit | Attending: Radiation Oncology | Admitting: Radiation Oncology

## 2022-11-06 DIAGNOSIS — C50211 Malignant neoplasm of upper-inner quadrant of right female breast: Secondary | ICD-10-CM | POA: Diagnosis not present

## 2022-11-06 DIAGNOSIS — F4329 Adjustment disorder with other symptoms: Secondary | ICD-10-CM | POA: Diagnosis not present

## 2022-11-06 DIAGNOSIS — Z170 Estrogen receptor positive status [ER+]: Secondary | ICD-10-CM | POA: Diagnosis not present

## 2022-11-06 DIAGNOSIS — Z510 Encounter for antineoplastic radiation therapy: Secondary | ICD-10-CM | POA: Diagnosis not present

## 2022-11-06 LAB — RAD ONC ARIA SESSION SUMMARY
Course Elapsed Days: 24
Plan Fractions Treated to Date: 10
Plan Prescribed Dose Per Fraction: 1.8 Gy
Plan Total Fractions Prescribed: 14
Plan Total Prescribed Dose: 25.2 Gy
Reference Point Dosage Given to Date: 18 Gy
Reference Point Session Dosage Given: 1.8 Gy
Session Number: 19

## 2022-11-07 ENCOUNTER — Ambulatory Visit
Admission: RE | Admit: 2022-11-07 | Discharge: 2022-11-07 | Disposition: A | Discharge status: Home / Self Care | Payer: BC Managed Care – PPO | Source: Ambulatory Visit | Attending: Radiation Oncology

## 2022-11-07 ENCOUNTER — Other Ambulatory Visit: Payer: Self-pay

## 2022-11-07 DIAGNOSIS — C50211 Malignant neoplasm of upper-inner quadrant of right female breast: Secondary | ICD-10-CM | POA: Diagnosis not present

## 2022-11-07 DIAGNOSIS — F4329 Adjustment disorder with other symptoms: Secondary | ICD-10-CM | POA: Diagnosis not present

## 2022-11-07 DIAGNOSIS — Z510 Encounter for antineoplastic radiation therapy: Secondary | ICD-10-CM | POA: Diagnosis not present

## 2022-11-07 DIAGNOSIS — Z170 Estrogen receptor positive status [ER+]: Secondary | ICD-10-CM | POA: Diagnosis not present

## 2022-11-07 LAB — RAD ONC ARIA SESSION SUMMARY
Course Elapsed Days: 25
Plan Fractions Treated to Date: 10
Plan Prescribed Dose Per Fraction: 1.8 Gy
Plan Total Fractions Prescribed: 14
Plan Total Prescribed Dose: 25.2 Gy
Reference Point Dosage Given to Date: 18 Gy
Reference Point Session Dosage Given: 1.8 Gy
Session Number: 20

## 2022-11-10 ENCOUNTER — Ambulatory Visit
Admission: RE | Admit: 2022-11-10 | Discharge: 2022-11-10 | Disposition: A | Discharge status: Home / Self Care | Payer: BC Managed Care – PPO | Source: Ambulatory Visit | Attending: Radiation Oncology | Admitting: Radiation Oncology

## 2022-11-10 ENCOUNTER — Other Ambulatory Visit: Payer: Self-pay

## 2022-11-10 DIAGNOSIS — C50211 Malignant neoplasm of upper-inner quadrant of right female breast: Secondary | ICD-10-CM | POA: Diagnosis not present

## 2022-11-10 DIAGNOSIS — F4329 Adjustment disorder with other symptoms: Secondary | ICD-10-CM | POA: Diagnosis not present

## 2022-11-10 DIAGNOSIS — Z170 Estrogen receptor positive status [ER+]: Secondary | ICD-10-CM | POA: Diagnosis not present

## 2022-11-10 DIAGNOSIS — Z510 Encounter for antineoplastic radiation therapy: Secondary | ICD-10-CM | POA: Diagnosis not present

## 2022-11-10 LAB — RAD ONC ARIA SESSION SUMMARY
Course Elapsed Days: 28
Plan Fractions Treated to Date: 11
Plan Prescribed Dose Per Fraction: 1.8 Gy
Plan Total Fractions Prescribed: 14
Plan Total Prescribed Dose: 25.2 Gy
Reference Point Dosage Given to Date: 19.8 Gy
Reference Point Session Dosage Given: 1.8 Gy
Session Number: 21

## 2022-11-11 ENCOUNTER — Other Ambulatory Visit: Payer: Self-pay

## 2022-11-11 ENCOUNTER — Ambulatory Visit
Admission: RE | Admit: 2022-11-11 | Discharge: 2022-11-11 | Disposition: A | Discharge status: Home / Self Care | Payer: BC Managed Care – PPO | Source: Ambulatory Visit | Attending: Radiation Oncology

## 2022-11-11 DIAGNOSIS — Z170 Estrogen receptor positive status [ER+]: Secondary | ICD-10-CM | POA: Diagnosis not present

## 2022-11-11 DIAGNOSIS — F4329 Adjustment disorder with other symptoms: Secondary | ICD-10-CM | POA: Diagnosis not present

## 2022-11-11 DIAGNOSIS — C50211 Malignant neoplasm of upper-inner quadrant of right female breast: Secondary | ICD-10-CM | POA: Diagnosis not present

## 2022-11-11 LAB — RAD ONC ARIA SESSION SUMMARY
Course Elapsed Days: 29
Plan Fractions Treated to Date: 11
Plan Prescribed Dose Per Fraction: 1.8 Gy
Plan Total Fractions Prescribed: 14
Plan Total Prescribed Dose: 25.2 Gy
Reference Point Dosage Given to Date: 19.8 Gy
Reference Point Session Dosage Given: 1.8 Gy
Session Number: 22

## 2022-11-12 ENCOUNTER — Ambulatory Visit: Payer: Self-pay | Admitting: Genetic Counselor

## 2022-11-12 ENCOUNTER — Ambulatory Visit
Admission: RE | Admit: 2022-11-12 | Discharge: 2022-11-12 | Disposition: A | Discharge status: Home / Self Care | Payer: BC Managed Care – PPO | Source: Ambulatory Visit | Attending: Radiation Oncology | Admitting: Radiation Oncology

## 2022-11-12 ENCOUNTER — Encounter: Payer: Self-pay | Admitting: Genetic Counselor

## 2022-11-12 ENCOUNTER — Other Ambulatory Visit: Payer: Self-pay

## 2022-11-12 ENCOUNTER — Telehealth: Payer: Self-pay | Admitting: Genetic Counselor

## 2022-11-12 DIAGNOSIS — F4329 Adjustment disorder with other symptoms: Secondary | ICD-10-CM | POA: Diagnosis not present

## 2022-11-12 DIAGNOSIS — Z803 Family history of malignant neoplasm of breast: Secondary | ICD-10-CM

## 2022-11-12 DIAGNOSIS — Z1379 Encounter for other screening for genetic and chromosomal anomalies: Secondary | ICD-10-CM

## 2022-11-12 DIAGNOSIS — Z170 Estrogen receptor positive status [ER+]: Secondary | ICD-10-CM | POA: Diagnosis not present

## 2022-11-12 DIAGNOSIS — C50211 Malignant neoplasm of upper-inner quadrant of right female breast: Secondary | ICD-10-CM | POA: Diagnosis not present

## 2022-11-12 DIAGNOSIS — Z8042 Family history of malignant neoplasm of prostate: Secondary | ICD-10-CM

## 2022-11-12 LAB — RAD ONC ARIA SESSION SUMMARY
Course Elapsed Days: 30
Plan Fractions Treated to Date: 12
Plan Prescribed Dose Per Fraction: 1.8 Gy
Plan Total Fractions Prescribed: 14
Plan Total Prescribed Dose: 25.2 Gy
Reference Point Dosage Given to Date: 21.6 Gy
Reference Point Session Dosage Given: 1.8 Gy
Session Number: 23

## 2022-11-12 NOTE — Telephone Encounter (Signed)
Disclosed negative genetics.    

## 2022-11-12 NOTE — Progress Notes (Signed)
HPI:   [name] was previously seen in the [facility] due to a personal and family history of breast cancer and concerns regarding a hereditary predisposition to cancer.    [name] recent genetic test results were disclosed to her by telephone. These results and recommendations are discussed in more detail below.  CANCER HISTORY:  In March 2003, at the age of 27, [name] was diagnosed with invasive ductal carcinoma of the right breast (ER+/PR+/HER2-).  She was diagnosed with right invasive ductal carcinoma (ER+/PR+/HER2-) again in September 2018 at the age of 44.  The treatment plan included bilateral mastectomy and anti-estrogens.  Based on oncotype score and node negative status, she did not need adjuvant chemotherapy or adjuvant radiation.    In May 2024, at the age of 62, she was diagnosed again with right invasive ductal carcinoma (ER+/PR+/HER2-) s/p lumpectomy. She is  undergoing adjuvant radiation with plans for anti-estrogens.   Oncology History Overview Note  Cancer Staging Malignant neoplasm of upper-inner quadrant of right breast in female, estrogen receptor positive (HCC) Staging form: Breast, AJCC 8th Edition - Clinical stage from 10/09/2016: Stage IA (cT1c, cN0, cM0, G3, ER: Positive, PR: Positive, HER2: Negative) - Signed by Malachy Mood, MD on 10/14/2016 - Pathologic stage from 11/17/2016: Stage IB (pT2, pN0, cM0, G3, ER: Positive, PR: Positive, HER2: Negative, Oncotype DX score: 21) - Signed by Malachy Mood, MD on 12/11/2016     Malignant neoplasm of upper-inner quadrant of right breast in female, estrogen receptor positive (HCC)  10/08/2016 Mammogram   Diagnostic mammogram and ultrasound showed an irregular mass in the right breast at 1 o'clock, 5 cm from the nipple measuring 1.6 x 1.4x 1.6 cm, axilla was negative for adenopathy.   10/09/2016 Receptors her2   Estrogen Receptor: 80%, POSITIVE, STRONG STAINING INTENSITY Progesterone Receptor: 30%,  POSITIVE, STRONG STAINING INTENSITY Proliferation Marker Ki67: 35% HER2 NEGATIVE    10/09/2016 Initial Diagnosis   Malignant neoplasm of upper-inner quadrant of right breast in female, estrogen receptor positive (HCC)   10/09/2016 Initial Biopsy   Diagnosis Breast, right, needle core biopsy, upper inner quadrant, 1:00 o'clock - INVASIVE DUCTAL CARCINOMA, G3   10/2016 Genetic Testing   Genetic testing was negative for The genes analyzed were the 23 genes on [facility] Breast/GYN panel (ATM, BARD1, BRCA1, BRCA2, BRIP1, CDH1, CHEK2, DICER1, EPCAM, MLH1,  MSH2, MSH6, NBN, NF1, PALB2, PMS2, PTEN, RAD50, RAD51C, RAD51D,SMARCA4, STK11, and TP53).     11/17/2016 Surgery   RIGHT MASTECTOMY AND LEFT PROPHYLATIC MASTECTOMY by Dr. Carolynne Edouard  IMEDITATE BREAST RECONSTRUCTION WITH PLACEMENT OF TISSUE EXPANDER AND FLEX HD (ACELLULAR HYDRATED DERMIS) by Dr. Ulice Bold     11/17/2016 Pathology Results   Diagnosis 11/17/16 1. Breast, simple mastectomy, Left - BENIGN BREAST TISSUE WITH ECTATIC DUCTS. - ONE OF ONE LYMPH NODE NEGATIVE FOR CARCINOMA (0/1). 2. Breast, simple mastectomy, Right - INVASIVE DUCTAL CARCINOMA, GRADE 3, SPANNING 2.4 CM. - HIGH GRADE DUCTAL CARCINOMA IN SITU. - INVASIVE CARCINOMA IS <0.1 CM OF THE POSTERIOR MARGIN (BROADLY) AND ANTERIOR MARGIN (FOCALLY). - IN SITU CARCINOMA IS >0.2 CM OF BOTH MARGINS. - PRIOR LUMPECTOMY SITE, NEGATIVE FOR CARCINOMA. - SEE ONCOLOGY TABLE. 3. Breast, excision, Superior flap deep margin - BENIGN FIBROADIPOSE TISSUE.   11/17/2016 Miscellaneous   Oncotype on 11/17/16 Rcurence Risk Score of 21 10-Year risk of distant reucrrence with Tamoxifen alone is 13%    12/18/2016 - 12/30/2021 Anti-estrogen oral therapy   Adjuvant Letrozole 2.5 mg daily; began 12/18/16. Due to arthritis  I switched her to Exemestane 25mg  daily in 11/2019.    12/24/2016 Imaging   BONE SCAN IMPRESSION: No evidence of osseous metastatic disease.   12/24/2016 Imaging   CT CAP  IMPRESSION: 1. Status post bilateral modified radical mastectomy and breast reconstruction, as above. No findings to suggest metastatic disease in the chest, abdomen or pelvis. 2. Multiple heterogeneous appearing thyroid nodules, as discussed above. These are nonspecific but further evaluation with nonemergent thyroid ultrasound is recommended in the near future to determine any potential need for further evaluation with fine-needle aspiration. 3. Incidental findings, as above.   02/11/2017 Surgery   REMOVAL OF BILATERAL TISSUE EXPANDERS WITH PLACEMENT OF BILATERAL SILICONE BREAST IMPLANTS by Dr. Ulice Bold    06/19/2022 Imaging   R AX [place]:  Targeted ultrasound is performed, showing a 5 x 2 x 2 mm irregular hypoechoic mass right breast 1 o'clock position 3 cm from nipple at the site of palpable concern. No right axillary adenopathy.   IMPRESSION: Indeterminate palpable mass right breast 1 o'clock position.   07/01/2022 Pathology Results   Recurrence/new   Breast, right, needle core biopsy, 1 o'clock, 3cmfn, hydromark clip INVASIVE DUCTAL CARCINOMA, RECURRENT OVERALL GRADE: 2  PROGNOSTIC INDICATORS The tumor cells are EQUIVOCAL for Her2 (2+).  Estrogen Receptor: 100%, POSITIVE, STRONG STAINING INTENSITY Progesterone Receptor: 100%, POSITIVE, STRONG STAINING INTENSITY Proliferation Marker Ki67: 20%  1A. Breast, righnt,needle core biopsy, 1 o'clock, 3 cmfn FLUORESCENCE IN-SITU HYBRIDIZATION Results: GROUP 5: HER2 **NEGATIVE**     11/12/2022 Genetic Testing   Negative [facility] CancerNext-Expanded +RNAinsight Panel.  Report date is 11/12/2022.   The CancerNext-Expanded gene panel offered by [facility] and includes sequencing, rearrangement, and RNA analysis for the following 71 genes:  AIP, ALK, APC, ATM, BAP1, BARD1, BMPR1A, BRCA1, BRCA2, BRIP1, CDC73, CDH1, CDK4, CDKN1B, CDKN2A, CHEK2, DICER1, FH, FLCN, KIF1B, LZTR1,MAX, MEN1, MET, MLH1, MSH2, MSH6, MUTYH, NF1, NF2, NTHL1, PALB2,  PHOX2B, PMS2, POT1, PRKAR1A, PTCH1, PTEN, RAD51C,RAD51D, RB1, RET, SDHA, SDHAF2, SDHB, SDHC, SDHD, SMAD4, SMARCA4, SMARCB1, SMARCE1, STK11, SUFU, TMEM127, TP53,TSC1, TSC2 and VHL (sequencing and deletion/duplication); AXIN2, CTNNA1, EGFR, EGLN1, HOXB13, KIT, MITF, MSH3, PDGFRA, POLD1 and POLE (sequencing only); EPCAM and GREM1 (deletion/duplication only).      FAMILY HISTORY:  We obtained a detailed, 4-generation family history.  Significant diagnoses are listed below:      Family History  Problem Relation Age of Onset   Skin cancer Mother          unk type   Prostate cancer Father 43        deceased 29   Cancer Brother 6        multiple myeloma    Lung cancer Maternal Uncle          x2 mat uncles; dx 15s-70s   Breast cancer Paternal Aunt 22   Breast cancer Cousin          x2 pat female cousins; dx 28s   Esophageal cancer Cousin 90        pat female cousin         Ms. Nile is unaware of previous family history of genetic testing for hereditary cancer risks.    There is no reported Ashkenazi Jewish ancestry. There is no known consanguinity.    GENETIC TEST RESULTS:  The [facility] CancerNext-Expanded +RNAinsight Panel found no pathogenic mutations.   The CancerNext-Expanded gene panel offered by [facility] and includes sequencing, rearrangement, and RNA analysis for the following 71 genes:  AIP, ALK, APC, ATM, BAP1, BARD1, BMPR1A,  BRCA1, BRCA2, BRIP1, CDC73, CDH1, CDK4, CDKN1B, CDKN2A, CHEK2, DICER1, FH, FLCN, KIF1B, LZTR1,MAX, MEN1, MET, MLH1, MSH2, MSH6, MUTYH, NF1, NF2, NTHL1, PALB2, PHOX2B, PMS2, POT1, PRKAR1A, PTCH1, PTEN, RAD51C,RAD51D, RB1, RET, SDHA, SDHAF2, SDHB, SDHC, SDHD, SMAD4, SMARCA4, SMARCB1, SMARCE1, STK11, SUFU, TMEM127, TP53,TSC1, TSC2 and VHL (sequencing and deletion/duplication); AXIN2, CTNNA1, EGFR, EGLN1, HOXB13, KIT, MITF, MSH3, PDGFRA, POLD1 and POLE (sequencing only); EPCAM and GREM1 (deletion/duplication only).   The test report has been scanned into EPIC  and is located under the Molecular Pathology section of the Results Review tab.  A portion of the result report is included below for reference. Genetic testing reported out on November 12, 2022.      Even though a pathogenic variant was not identified, possible explanations for the cancer in the family may include: There may be no hereditary risk for cancer in the family. The cancers in Ms. Fruchter and/or her family may be sporadic/familial or due to other genetic and environmental factors.  Most cancer is not hereditary.  There may be a gene mutation in one of these genes that current testing methods cannot detect but that chance is small. There could be another gene that has not yet been discovered, or that we have not yet tested, that is responsible for the cancer diagnoses in the family.  It is also possible there is a hereditary cause for the cancer in the family that Ms. Delia did not inherit.   Therefore, it is important to remain in touch with cancer genetics in the future so that we can continue to offer Ms. Perham the most up to date genetic testing.     ADDITIONAL GENETIC TESTING:   Ms. Rozas genetic testing was fairly extensive.  If there are additional relevant genes identified to increase cancer risk that can be analyzed in the future, we would be happy to discuss and coordinate this testing at that time.      CANCER SCREENING RECOMMENDATIONS:  Ms. Inclan test result is considered negative (normal).  This means that we have not identified a hereditary cause for her personal history of breast cancer at this time.   An individual's cancer risk and medical management are not determined by genetic test results alone. Overall cancer risk assessment incorporates additional factors, including personal medical history, family history, and any available genetic information that may result in a personalized plan for cancer prevention and surveillance. Therefore, it is recommended she  continue to follow the cancer management and screening guidelines provided by her oncology and primary healthcare provider.    RECOMMENDATIONS FOR FAMILY MEMBERS:   Individuals in this family might be at some increased risk of developing cancer, over the general population risk, due to the family history of cancer.  Individuals in the family should notify their providers of the family history of cancer. We recommend women in this family have a yearly mammogram beginning at age 40, or 80 years younger than the earliest onset of cancer, an annual clinical breast exam, and perform monthly breast self-exams.  Risk models that take into account family history and hormonal history may be helpful in determining appropriate breast cancer screening options for family members.  Female relatives should speak with their providers about prostate cancer screening.  Other members of the family may still carry a pathogenic variant in one of these genes that Ms. Haupt did not inherit. Based on the family history, we recommend her paternal cousins, who were diagnosed with breast cancer, have genetic counseling and testing. Ms.  Walthall can let us know if we can be of any assistance in coordinating genetic counseling and/or testing for these family members.     FOLLOW-UP:  Cancer genetics is a rapidly advancing field and it is possible that new genetic tests will be appropriate for her and/or her family members in the future. We encourage Ms. Radke to remain in contact with cancer genetics, so we can update her personal and family histories and let her know of advances in cancer genetics that may benefit this family.   Our contact number was provided.  They are welcome to call us at anytime with additional questions or concerns.   Di Jasmer M. Rennie Plowman, MS, [facility] Genetic Counselor [email or web address] (P) [phone]

## 2022-11-13 ENCOUNTER — Other Ambulatory Visit: Payer: Self-pay

## 2022-11-13 ENCOUNTER — Ambulatory Visit
Admission: RE | Admit: 2022-11-13 | Discharge: 2022-11-13 | Disposition: A | Discharge status: Home / Self Care | Payer: BC Managed Care – PPO | Source: Ambulatory Visit | Attending: Radiation Oncology | Admitting: Radiation Oncology

## 2022-11-13 DIAGNOSIS — C50211 Malignant neoplasm of upper-inner quadrant of right female breast: Secondary | ICD-10-CM | POA: Diagnosis not present

## 2022-11-13 DIAGNOSIS — Z170 Estrogen receptor positive status [ER+]: Secondary | ICD-10-CM | POA: Diagnosis not present

## 2022-11-13 DIAGNOSIS — Z510 Encounter for antineoplastic radiation therapy: Secondary | ICD-10-CM | POA: Diagnosis not present

## 2022-11-13 DIAGNOSIS — F4329 Adjustment disorder with other symptoms: Secondary | ICD-10-CM | POA: Diagnosis not present

## 2022-11-13 LAB — RAD ONC ARIA SESSION SUMMARY
Course Elapsed Days: 31
Plan Fractions Treated to Date: 12
Plan Prescribed Dose Per Fraction: 1.8 Gy
Plan Total Fractions Prescribed: 14
Plan Total Prescribed Dose: 25.2 Gy
Reference Point Dosage Given to Date: 21.6 Gy
Reference Point Session Dosage Given: 1.8 Gy
Session Number: 24

## 2022-11-14 ENCOUNTER — Ambulatory Visit
Admission: RE | Admit: 2022-11-14 | Discharge: 2022-11-14 | Disposition: A | Discharge status: Home / Self Care | Payer: BC Managed Care – PPO | Source: Ambulatory Visit | Attending: Radiation Oncology | Admitting: Radiation Oncology

## 2022-11-14 ENCOUNTER — Other Ambulatory Visit: Payer: Self-pay

## 2022-11-14 DIAGNOSIS — F4329 Adjustment disorder with other symptoms: Secondary | ICD-10-CM | POA: Diagnosis not present

## 2022-11-14 DIAGNOSIS — Z510 Encounter for antineoplastic radiation therapy: Secondary | ICD-10-CM | POA: Diagnosis not present

## 2022-11-14 DIAGNOSIS — Z170 Estrogen receptor positive status [ER+]: Secondary | ICD-10-CM | POA: Diagnosis not present

## 2022-11-14 DIAGNOSIS — C50211 Malignant neoplasm of upper-inner quadrant of right female breast: Secondary | ICD-10-CM | POA: Diagnosis not present

## 2022-11-14 LAB — RAD ONC ARIA SESSION SUMMARY
Course Elapsed Days: 32
Plan Fractions Treated to Date: 13
Plan Prescribed Dose Per Fraction: 1.8 Gy
Plan Total Fractions Prescribed: 14
Plan Total Prescribed Dose: 25.2 Gy
Reference Point Dosage Given to Date: 23.4 Gy
Reference Point Session Dosage Given: 1.8 Gy
Session Number: 25

## 2022-11-17 ENCOUNTER — Ambulatory Visit
Admission: RE | Admit: 2022-11-17 | Discharge: 2022-11-17 | Disposition: A | Discharge status: Home / Self Care | Payer: BC Managed Care – PPO | Source: Ambulatory Visit | Attending: Radiation Oncology | Admitting: Radiation Oncology

## 2022-11-17 ENCOUNTER — Ambulatory Visit: Payer: BC Managed Care – PPO | Admitting: Radiation Oncology

## 2022-11-17 ENCOUNTER — Other Ambulatory Visit: Payer: Self-pay

## 2022-11-17 DIAGNOSIS — Z170 Estrogen receptor positive status [ER+]: Secondary | ICD-10-CM | POA: Diagnosis not present

## 2022-11-17 DIAGNOSIS — F4329 Adjustment disorder with other symptoms: Secondary | ICD-10-CM | POA: Diagnosis not present

## 2022-11-17 DIAGNOSIS — Z510 Encounter for antineoplastic radiation therapy: Secondary | ICD-10-CM | POA: Diagnosis not present

## 2022-11-17 DIAGNOSIS — C50211 Malignant neoplasm of upper-inner quadrant of right female breast: Secondary | ICD-10-CM | POA: Diagnosis not present

## 2022-11-17 LAB — RAD ONC ARIA SESSION SUMMARY
Course Elapsed Days: 35
Plan Fractions Treated to Date: 13
Plan Prescribed Dose Per Fraction: 1.8 Gy
Plan Total Fractions Prescribed: 14
Plan Total Prescribed Dose: 25.2 Gy
Reference Point Dosage Given to Date: 23.4 Gy
Reference Point Session Dosage Given: 1.8 Gy
Session Number: 26

## 2022-11-18 ENCOUNTER — Ambulatory Visit
Admission: RE | Admit: 2022-11-18 | Discharge: 2022-11-18 | Discharge status: Home / Self Care | Payer: BC Managed Care – PPO | Source: Ambulatory Visit | Attending: Radiation Oncology

## 2022-11-18 ENCOUNTER — Other Ambulatory Visit: Payer: Self-pay

## 2022-11-18 DIAGNOSIS — Z510 Encounter for antineoplastic radiation therapy: Secondary | ICD-10-CM | POA: Diagnosis not present

## 2022-11-18 DIAGNOSIS — Z170 Estrogen receptor positive status [ER+]: Secondary | ICD-10-CM | POA: Diagnosis not present

## 2022-11-18 DIAGNOSIS — F4329 Adjustment disorder with other symptoms: Secondary | ICD-10-CM | POA: Diagnosis not present

## 2022-11-18 DIAGNOSIS — C50211 Malignant neoplasm of upper-inner quadrant of right female breast: Secondary | ICD-10-CM | POA: Diagnosis not present

## 2022-11-18 LAB — RAD ONC ARIA SESSION SUMMARY
Course Elapsed Days: 36
Plan Fractions Treated to Date: 14
Plan Prescribed Dose Per Fraction: 1.8 Gy
Plan Total Fractions Prescribed: 14
Plan Total Prescribed Dose: 25.2 Gy
Reference Point Dosage Given to Date: 25.2 Gy
Reference Point Session Dosage Given: 1.8 Gy
Session Number: 27

## 2022-11-19 ENCOUNTER — Ambulatory Visit
Admission: RE | Admit: 2022-11-19 | Discharge: 2022-11-19 | Disposition: A | Discharge status: Home / Self Care | Payer: BC Managed Care – PPO | Source: Ambulatory Visit | Attending: Radiation Oncology | Admitting: Radiation Oncology

## 2022-11-19 ENCOUNTER — Other Ambulatory Visit: Payer: Self-pay

## 2022-11-19 DIAGNOSIS — C50211 Malignant neoplasm of upper-inner quadrant of right female breast: Secondary | ICD-10-CM | POA: Diagnosis not present

## 2022-11-19 DIAGNOSIS — Z170 Estrogen receptor positive status [ER+]: Secondary | ICD-10-CM | POA: Diagnosis not present

## 2022-11-19 DIAGNOSIS — F4329 Adjustment disorder with other symptoms: Secondary | ICD-10-CM | POA: Diagnosis not present

## 2022-11-19 DIAGNOSIS — Z510 Encounter for antineoplastic radiation therapy: Secondary | ICD-10-CM | POA: Diagnosis not present

## 2022-11-19 LAB — RAD ONC ARIA SESSION SUMMARY
Course Elapsed Days: 37
Plan Fractions Treated to Date: 14
Plan Prescribed Dose Per Fraction: 1.8 Gy
Plan Total Fractions Prescribed: 14
Plan Total Prescribed Dose: 25.2 Gy
Reference Point Dosage Given to Date: 25.2 Gy
Reference Point Session Dosage Given: 1.8 Gy
Session Number: 28

## 2022-11-20 ENCOUNTER — Other Ambulatory Visit: Payer: Self-pay

## 2022-11-20 ENCOUNTER — Ambulatory Visit
Admission: RE | Admit: 2022-11-20 | Discharge: 2022-11-20 | Disposition: A | Discharge status: Home / Self Care | Payer: BC Managed Care – PPO | Source: Ambulatory Visit | Attending: Radiation Oncology | Admitting: Radiation Oncology

## 2022-11-20 DIAGNOSIS — F4329 Adjustment disorder with other symptoms: Secondary | ICD-10-CM | POA: Diagnosis not present

## 2022-11-20 DIAGNOSIS — Z170 Estrogen receptor positive status [ER+]: Secondary | ICD-10-CM | POA: Diagnosis not present

## 2022-11-20 DIAGNOSIS — C50211 Malignant neoplasm of upper-inner quadrant of right female breast: Secondary | ICD-10-CM | POA: Diagnosis not present

## 2022-11-20 LAB — RAD ONC ARIA SESSION SUMMARY
Course Elapsed Days: 38
Plan Fractions Treated to Date: 1
Plan Prescribed Dose Per Fraction: 2 Gy
Plan Total Fractions Prescribed: 5
Plan Total Prescribed Dose: 10 Gy
Reference Point Dosage Given to Date: 2 Gy
Reference Point Session Dosage Given: 2 Gy
Session Number: 29

## 2022-11-21 ENCOUNTER — Ambulatory Visit
Admission: RE | Admit: 2022-11-21 | Discharge: 2022-11-21 | Disposition: A | Discharge status: Home / Self Care | Payer: BC Managed Care – PPO | Source: Ambulatory Visit | Attending: Radiation Oncology | Admitting: Radiation Oncology

## 2022-11-21 ENCOUNTER — Other Ambulatory Visit: Payer: Self-pay

## 2022-11-21 DIAGNOSIS — Z510 Encounter for antineoplastic radiation therapy: Secondary | ICD-10-CM | POA: Diagnosis not present

## 2022-11-21 DIAGNOSIS — Z170 Estrogen receptor positive status [ER+]: Secondary | ICD-10-CM | POA: Diagnosis not present

## 2022-11-21 DIAGNOSIS — F4329 Adjustment disorder with other symptoms: Secondary | ICD-10-CM | POA: Diagnosis not present

## 2022-11-21 DIAGNOSIS — C50211 Malignant neoplasm of upper-inner quadrant of right female breast: Secondary | ICD-10-CM | POA: Diagnosis not present

## 2022-11-21 LAB — RAD ONC ARIA SESSION SUMMARY
Course Elapsed Days: 39
Plan Fractions Treated to Date: 2
Plan Prescribed Dose Per Fraction: 2 Gy
Plan Total Fractions Prescribed: 5
Plan Total Prescribed Dose: 10 Gy
Reference Point Dosage Given to Date: 4 Gy
Reference Point Session Dosage Given: 2 Gy
Session Number: 30

## 2022-11-24 ENCOUNTER — Other Ambulatory Visit: Payer: Self-pay

## 2022-11-24 ENCOUNTER — Ambulatory Visit
Admission: RE | Admit: 2022-11-24 | Discharge: 2022-11-24 | Disposition: A | Discharge status: Home / Self Care | Payer: BC Managed Care – PPO | Source: Ambulatory Visit | Attending: Radiation Oncology

## 2022-11-24 ENCOUNTER — Ambulatory Visit
Admission: RE | Admit: 2022-11-24 | Discharge: 2022-11-24 | Disposition: A | Discharge status: Home / Self Care | Payer: BC Managed Care – PPO | Source: Ambulatory Visit | Attending: Radiation Oncology | Admitting: Radiation Oncology

## 2022-11-24 DIAGNOSIS — F4329 Adjustment disorder with other symptoms: Secondary | ICD-10-CM | POA: Diagnosis not present

## 2022-11-24 DIAGNOSIS — C50211 Malignant neoplasm of upper-inner quadrant of right female breast: Secondary | ICD-10-CM | POA: Diagnosis not present

## 2022-11-24 DIAGNOSIS — Z170 Estrogen receptor positive status [ER+]: Secondary | ICD-10-CM | POA: Diagnosis not present

## 2022-11-24 LAB — RAD ONC ARIA SESSION SUMMARY
Course Elapsed Days: 42
Plan Fractions Treated to Date: 3
Plan Prescribed Dose Per Fraction: 2 Gy
Plan Total Fractions Prescribed: 5
Plan Total Prescribed Dose: 10 Gy
Reference Point Dosage Given to Date: 6 Gy
Reference Point Session Dosage Given: 2 Gy
Session Number: 31

## 2022-11-25 ENCOUNTER — Other Ambulatory Visit: Payer: Self-pay

## 2022-11-25 ENCOUNTER — Ambulatory Visit
Admission: RE | Admit: 2022-11-25 | Discharge: 2022-11-25 | Disposition: A | Discharge status: Home / Self Care | Payer: BC Managed Care – PPO | Source: Ambulatory Visit | Attending: Radiation Oncology | Admitting: Radiation Oncology

## 2022-11-25 DIAGNOSIS — F4329 Adjustment disorder with other symptoms: Secondary | ICD-10-CM | POA: Diagnosis not present

## 2022-11-25 DIAGNOSIS — C50211 Malignant neoplasm of upper-inner quadrant of right female breast: Secondary | ICD-10-CM | POA: Diagnosis not present

## 2022-11-25 DIAGNOSIS — Z170 Estrogen receptor positive status [ER+]: Secondary | ICD-10-CM | POA: Diagnosis not present

## 2022-11-25 LAB — RAD ONC ARIA SESSION SUMMARY
Course Elapsed Days: 43
Plan Fractions Treated to Date: 4
Plan Prescribed Dose Per Fraction: 2 Gy
Plan Total Fractions Prescribed: 5
Plan Total Prescribed Dose: 10 Gy
Reference Point Dosage Given to Date: 8 Gy
Reference Point Session Dosage Given: 2 Gy
Session Number: 32

## 2022-11-26 ENCOUNTER — Ambulatory Visit
Admission: RE | Admit: 2022-11-26 | Discharge: 2022-11-26 | Disposition: A | Discharge status: Home / Self Care | Payer: BC Managed Care – PPO | Source: Ambulatory Visit | Attending: Radiation Oncology | Admitting: Radiation Oncology

## 2022-11-26 ENCOUNTER — Other Ambulatory Visit: Payer: Self-pay

## 2022-11-26 DIAGNOSIS — C50211 Malignant neoplasm of upper-inner quadrant of right female breast: Secondary | ICD-10-CM | POA: Diagnosis not present

## 2022-11-26 DIAGNOSIS — Z170 Estrogen receptor positive status [ER+]: Secondary | ICD-10-CM | POA: Diagnosis not present

## 2022-11-26 DIAGNOSIS — F4329 Adjustment disorder with other symptoms: Secondary | ICD-10-CM | POA: Diagnosis not present

## 2022-11-26 DIAGNOSIS — Z510 Encounter for antineoplastic radiation therapy: Secondary | ICD-10-CM | POA: Diagnosis not present

## 2022-11-26 LAB — RAD ONC ARIA SESSION SUMMARY
Course Elapsed Days: 44
Plan Fractions Treated to Date: 5
Plan Prescribed Dose Per Fraction: 2 Gy
Plan Total Fractions Prescribed: 5
Plan Total Prescribed Dose: 10 Gy
Reference Point Dosage Given to Date: 10 Gy
Reference Point Session Dosage Given: 2 Gy
Session Number: 33

## 2022-11-27 NOTE — Radiation Completion Notes (Signed)
Patient Name: Olivia [name], Olivia [name] MRN: [id number] Date of Birth: 06-16-1960 Referring Physician: Malachy Mood, M.D. Date of Service: 2022-11-27 Radiation Oncologist: Lonie Peak, M.D. [facility] [place]                             RADIATION ONCOLOGY END OF TREATMENT NOTE     Diagnosis: C50.211 Malignant neoplasm of upper-inner quadrant of right female breast Staging on 2016-11-17: Malignant neoplasm of upper-inner quadrant of right breast in female, estrogen receptor positive (HCC) T=pT2, N=pN0, M=cM0 Staging on 2016-10-09: Malignant neoplasm of upper-inner quadrant of right breast in female, estrogen receptor positive (HCC) T=cT1c, N=cN0, M=cM0 Intent: Curative     ==========DELIVERED PLANS==========  First Treatment Date: 2022-10-13 - Last Treatment Date: 2022-11-26   Plan Name: CW_R_BO Site: Chest Wall, Right Technique: 3D Mode: Photon Dose Per Fraction: 1.8 Gy Prescribed Dose (Delivered / Prescribed): 25.2 Gy / 25.2 Gy Prescribed Fxs (Delivered / Prescribed): 14 / 14   Plan Name: CW_R_Bst_BO Site: Chest Wall, Right Technique: Electron Mode: Electron Dose Per Fraction: 2 Gy Prescribed Dose (Delivered / Prescribed): 10 Gy / 10 Gy Prescribed Fxs (Delivered / Prescribed): 5 / 5   Plan Name: CW_R Site: Chest Wall, Right Technique: 3D Mode: Photon Dose Per Fraction: 1.8 Gy Prescribed Dose (Delivered / Prescribed): 25.2 Gy / 25.2 Gy Prescribed Fxs (Delivered / Prescribed): 14 / 14     ==========ON TREATMENT VISIT DATES========== 2022-10-13, 2022-10-20, 2022-10-27, 2022-11-03, 2022-11-10, 2022-11-17, 2022-11-24     ==========UPCOMING VISITS==========       ==========APPENDIX - ON TREATMENT VISIT NOTES==========   See weekly On Treatment Notes in Epic for details.

## 2022-11-29 ENCOUNTER — Other Ambulatory Visit: Payer: Self-pay | Admitting: Hematology

## 2022-12-01 NOTE — Assessment & Plan Note (Signed)
invasive ductal carcinoma, pT2N0, M0), Stage 1B, ER/PR: positive, HER2 negative, Grade 3, Oncotype RS 21, recurrence in 07/2022 -diagnosed 10/2016, s/p bilateral mastectomy and reconstruction. Based on oncotype RS she did not need adjuvant chemotherapy. And given node-negative mastectomy she did not require adjuvant radiation -started adjuvant letrozole 12/2016. She was changed to exemestane 11/2019 due to arthralgia.  Planned for a total of 7 years but stopped 12/2021 due to worsening bone density.  -Recent US/bx showed recurrent vs new R breast IDC, G2, ER/PR strongly positive, HER2 negative, with Ki67 20%.  Staging CT and bone scan showed no definitive evidence of metastasis. -She underwent right lumpectomy on August 18, 2022, no residual malignant cell was seen on surgical path.  Her initial biopsy showed 1.5 mm invasive cancer, the cancer was removed completely by biopsy. -Given the small size of invasive cancer, no benefit of adjuvant chemotherapy. -She previously received a total of 5 years of AI.  She stopped in November 2023 due to osteoprosis -I recommended 5 years of tamoxifen, she started in 08/2022 -She completed adjuvant RT on 11/26/2022

## 2022-12-02 ENCOUNTER — Encounter: Payer: Self-pay | Admitting: Hematology

## 2022-12-02 ENCOUNTER — Inpatient Hospital Stay: Payer: BC Managed Care – PPO | Attending: Nurse Practitioner | Admitting: Hematology

## 2022-12-02 ENCOUNTER — Other Ambulatory Visit: Payer: Self-pay

## 2022-12-02 DIAGNOSIS — C50211 Malignant neoplasm of upper-inner quadrant of right female breast: Secondary | ICD-10-CM

## 2022-12-02 DIAGNOSIS — Z170 Estrogen receptor positive status [ER+]: Secondary | ICD-10-CM | POA: Diagnosis not present

## 2022-12-02 NOTE — Progress Notes (Signed)
[facility]   [phone] [phone]   Clinic Follow up Note   Patient Care Team: Natalia Leatherwood, DO as PCP - General (Family Medicine) Malachy Mood, MD as Consulting Physician (Hematology) Griselda Miner, MD as Consulting Physician (General Surgery) Antony Blackbird, MD as Consulting Physician (Radiation Oncology) Mardella Layman, MD as Consulting Physician (Gastroenterology) Richarda Overlie, MD as Consulting Physician (Obstetrics and Gynecology) Axel Filler Larna Daughters, NP as Nurse Practitioner (Hematology and Oncology) Reather Littler, MD (Inactive) as Consulting Physician (Endocrinology) 12/02/2022  I connected with Olivia [name] on 12/02/22 at  2:40 PM EDT by telephone and verified that I am speaking with the correct person using two identifiers.   I discussed the limitations, risks, security and privacy concerns of performing an evaluation and management service by telephone and the availability of in person appointments. I also discussed with the patient that there may be a patient responsible charge related to this service. The patient expressed understanding and agreed to proceed.   Patient's location: Home Provider's location:  Office    CHIEF COMPLAINT: Follow-up breast cancer   CURRENT THERAPY: Pending tamoxifen  Oncology history Malignant neoplasm of upper-inner quadrant of right breast in female, estrogen receptor positive (HCC) invasive ductal carcinoma, pT2N0, M0), Stage 1B, ER/PR: positive, HER2 negative, Grade 3, Oncotype RS 21, recurrence in 07/2022 -diagnosed 10/2016, s/p bilateral mastectomy and reconstruction. Based on oncotype RS she did not need adjuvant chemotherapy. And given node-negative mastectomy she did not require adjuvant radiation -started adjuvant letrozole 12/2016. She was changed to exemestane 11/2019 due to arthralgia.  Planned for a total of 7 years but stopped 12/2021 due to worsening bone density.  -Recent US/bx  showed recurrent vs new R breast IDC, G2, ER/PR strongly positive, HER2 negative, with Ki67 20%.  Staging CT and bone scan showed no definitive evidence of metastasis. -She underwent right lumpectomy on August 18, 2022, no residual malignant cell was seen on surgical path.  Her initial biopsy showed 1.5 mm invasive cancer, the cancer was removed completely by biopsy. -Given the small size of invasive cancer, no benefit of adjuvant chemotherapy. -She previously received a total of 5 years of AI.  She stopped in November 2023 due to osteoprosis -I recommended 5 years of tamoxifen, she started in 08/2022 -She completed adjuvant RT on 11/26/2022   Assessment and Plan    Breast Cancer Recently completed radiation treatment with skin changes and fatigue.  -Start Tamoxifen, with monitoring for common side effects such as hot flashes and increased risk of blood clots.  She previously took tamoxifen for her first breast cancer -Encourage patient to stay active.  Radiation Dermatitis Skin changes post-radiation treatment, with new skin exposure and use of Neosporin and burn cream. -Continue current skin care regimen. -Start Vitamin E next week as suggested by radiation team.  Fatigue Post-radiation treatment fatigue, improving. -Expect further improvement with time.  Survivorship Post-treatment follow-up scheduled with Lacie in November. -Keep scheduled appointment.  [facility] Scheduled for December. -Keep scheduled appointment.     Plan -She was started tamoxifen next week -Survivorship scheduled with Lacie next month    SUMMARY OF ONCOLOGIC HISTORY: Oncology History Overview Note  Cancer Staging Malignant neoplasm of upper-inner quadrant of right breast in female, estrogen receptor positive (HCC) Staging form: Breast, AJCC 8th Edition - Clinical stage from 10/09/2016: Stage IA (cT1c, cN0, cM0, G3, ER: Positive, PR: Positive, HER2: Negative) - Signed by Malachy Mood, MD on  10/14/2016 - Pathologic stage from 11/17/2016: Stage  IB (pT2, pN0, cM0, G3, ER: Positive, PR: Positive, HER2: Negative, Oncotype DX score: 21) - Signed by Malachy Mood, MD on 12/11/2016     Malignant neoplasm of upper-inner quadrant of right breast in female, estrogen receptor positive (HCC)  10/08/2016 Mammogram   Diagnostic mammogram and ultrasound showed an irregular mass in the right breast at 1 o'clock, 5 cm from the nipple measuring 1.6 x 1.4x 1.6 cm, axilla was negative for adenopathy.   10/09/2016 Receptors her2   Estrogen Receptor: 80%, POSITIVE, STRONG STAINING INTENSITY Progesterone Receptor: 30%, POSITIVE, STRONG STAINING INTENSITY Proliferation Marker Ki67: 35% HER2 NEGATIVE    10/09/2016 Initial Diagnosis   Malignant neoplasm of upper-inner quadrant of right breast in female, estrogen receptor positive (HCC)   10/09/2016 Initial Biopsy   Diagnosis Breast, right, needle core biopsy, upper inner quadrant, 1:00 o'clock - INVASIVE DUCTAL CARCINOMA, G3   10/2016 Genetic Testing   Genetic testing was negative for The genes analyzed were the 23 genes on [facility]'s Breast/GYN panel (ATM, BARD1, BRCA1, BRCA2, BRIP1, CDH1, CHEK2, DICER1, EPCAM, MLH1,  MSH2, MSH6, NBN, NF1, PALB2, PMS2, PTEN, RAD50, RAD51C, RAD51D,SMARCA4, STK11, and TP53).     11/17/2016 Surgery   RIGHT MASTECTOMY AND LEFT PROPHYLATIC MASTECTOMY by Dr. Carolynne Edouard  IMEDITATE BREAST RECONSTRUCTION WITH PLACEMENT OF TISSUE EXPANDER AND FLEX HD (ACELLULAR HYDRATED DERMIS) by Dr. Ulice Bold     11/17/2016 Pathology Results   Diagnosis 11/17/16 1. Breast, simple mastectomy, Left - BENIGN BREAST TISSUE WITH ECTATIC DUCTS. - ONE OF ONE LYMPH NODE NEGATIVE FOR CARCINOMA (0/1). 2. Breast, simple mastectomy, Right - INVASIVE DUCTAL CARCINOMA, GRADE 3, SPANNING 2.4 CM. - HIGH GRADE DUCTAL CARCINOMA IN SITU. - INVASIVE CARCINOMA IS <0.1 CM OF THE POSTERIOR MARGIN (BROADLY) AND ANTERIOR MARGIN (FOCALLY). - IN SITU CARCINOMA IS >0.2 CM  OF BOTH MARGINS. - PRIOR LUMPECTOMY SITE, NEGATIVE FOR CARCINOMA. - SEE ONCOLOGY TABLE. 3. Breast, excision, Superior flap deep margin - BENIGN FIBROADIPOSE TISSUE.   11/17/2016 Miscellaneous   Oncotype on 11/17/16 Rcurence Risk Score of 21 10-Year risk of distant reucrrence with Tamoxifen alone is 13%    12/18/2016 - 12/30/2021 Anti-estrogen oral therapy   Adjuvant Letrozole 2.5 mg daily; began 12/18/16. Due to arthritis I switched her to Exemestane 25mg  daily in 11/2019.    12/24/2016 Imaging   BONE SCAN IMPRESSION: No evidence of osseous metastatic disease.   12/24/2016 Imaging   CT CAP IMPRESSION: 1. Status post bilateral modified radical mastectomy and breast reconstruction, as above. No findings to suggest metastatic disease in the chest, abdomen or pelvis. 2. Multiple heterogeneous appearing thyroid nodules, as discussed above. These are nonspecific but further evaluation with nonemergent thyroid ultrasound is recommended in the near future to determine any potential need for further evaluation with fine-needle aspiration. 3. Incidental findings, as above.   02/11/2017 Surgery   REMOVAL OF BILATERAL TISSUE EXPANDERS WITH PLACEMENT OF BILATERAL SILICONE BREAST IMPLANTS by Dr. Ulice Bold    06/19/2022 Imaging   R AX [place]:  Targeted ultrasound is performed, showing a 5 x 2 x 2 mm irregular hypoechoic mass right breast 1 o'clock position 3 cm from nipple at the site of palpable concern. No right axillary adenopathy.   IMPRESSION: Indeterminate palpable mass right breast 1 o'clock position.   07/01/2022 Pathology Results   Recurrence/new   Breast, right, needle core biopsy, 1 o'clock, 3cmfn, hydromark clip INVASIVE DUCTAL CARCINOMA, RECURRENT OVERALL GRADE: 2  PROGNOSTIC INDICATORS The tumor cells are EQUIVOCAL for Her2 (2+).  Estrogen Receptor: 100%, POSITIVE, STRONG STAINING INTENSITY  Progesterone Receptor: 100%, POSITIVE, STRONG STAINING INTENSITY Proliferation  Marker Ki67: 20%  1A. Breast, righnt,needle core biopsy, 1 o'clock, 3 cmfn FLUORESCENCE IN-SITU HYBRIDIZATION Results: GROUP 5: HER2 **NEGATIVE**     11/12/2022 Genetic Testing   Negative [facility] CancerNext-Expanded +RNAinsight Panel.  Report date is 11/12/2022.   The CancerNext-Expanded gene panel offered by [facility] and includes sequencing, rearrangement, and RNA analysis for the following 71 genes:  AIP, ALK, APC, ATM, BAP1, BARD1, BMPR1A, BRCA1, BRCA2, BRIP1, CDC73, CDH1, CDK4, CDKN1B, CDKN2A, CHEK2, DICER1, FH, FLCN, KIF1B, LZTR1,MAX, MEN1, MET, MLH1, MSH2, MSH6, MUTYH, NF1, NF2, NTHL1, PALB2, PHOX2B, PMS2, POT1, PRKAR1A, PTCH1, PTEN, RAD51C,RAD51D, RB1, RET, SDHA, SDHAF2, SDHB, SDHC, SDHD, SMAD4, SMARCA4, SMARCB1, SMARCE1, STK11, SUFU, TMEM127, TP53,TSC1, TSC2 and VHL (sequencing and deletion/duplication); AXIN2, CTNNA1, EGFR, EGLN1, HOXB13, KIT, MITF, MSH3, PDGFRA, POLD1 and POLE (sequencing only); EPCAM and GREM1 (deletion/duplication only).      Discussed the use of AI scribe software for clinical note transcription with the patient, who gave verbal consent to proceed.  History of Present Illness   A 62 year old patient with a history of recurrent breast cancer presents for a follow-up after recently completing radiation therapy. The patient reports experiencing fatigue and skin issues, including a burnt appearance and peeling, which she has been managing with Neosporin and burn cream. She notes that the skin issues are improving and expects the worst is over. The patient also mentions a history of taking tamoxifen twenty years ago, during her first diagnosis of breast cancer. She recalls experiencing tolerable hot flushes as a side effect. The patient is scheduled to start tamoxifen again and is aware of the potential for hot flushes and the slight increase in risk for blood clots. She expresses a commitment to staying active.         REVIEW OF SYSTEMS:   Constitutional: Denies  fevers, chills or abnormal weight loss Eyes: Denies blurriness of vision Ears, nose, mouth, throat, and face: Denies mucositis or sore throat Respiratory: Denies cough, dyspnea or wheezes Cardiovascular: Denies palpitation, chest discomfort or lower extremity swelling Gastrointestinal:  Denies nausea, heartburn or change in bowel habits Skin: Denies abnormal skin rashes Lymphatics: Denies new lymphadenopathy or easy bruising Neurological:Denies numbness, tingling or new weaknesses Behavioral/Psych: Mood is stable, no new changes  All other systems were reviewed with the patient and are negative.  MEDICAL HISTORY:  Past Medical History:  Diagnosis Date   Adjustment insomnia 02/23/2019   Breast cancer (HCC) 2018   Lumpectomy, chemotherapy and radiation with recurrence 2018. Now on Femara   Cervical radiculopathy 07/05/2019   COVID-19 02/2019   Her husband also passed away at this time from COVID-19.   Family history of breast cancer    Genetic testing 10/30/2016   Breast/GYN panel (23 genes) @ [facility] - No pathogenic mutations detected   GERD (gastroesophageal reflux disease)    OTC medication   Grief reaction 02/23/2019   Heart murmur    as a child - Echo in early 20s "ok"   History of echocardiogram 05/2015   Echo 4/17: EF 55-60%, GLS -19.6%, normal wall motion   History of exercise stress test 05/2015   ETT 4/17 - No ST changes   HLD (hyperlipidemia)    Hypothyroidism    Multiple thyroid nodules 2009   Multiple nodules: Normal ultrasound reported at diagnosis   Osteoporosis    Personal history of chemotherapy    Personal history of radiation therapy    UNEQUAL LEG LENGTH, ACQUIRED 11/17/2006   Qualifier: Diagnosis  of  By: Darrick Penna MD, KARL      SURGICAL HISTORY: Past Surgical History:  Procedure Laterality Date   ABDOMINAL HYSTERECTOMY     BREAST BIOPSY Right 07/01/2022   [place] RT BREAST BX W LOC DEV 1ST LESION IMG BX SPEC US GUIDE 07/01/2022 [facility]   BREAST  BIOPSY  08/15/2022   [place] RT RADIOACTIVE SEED LOC 08/15/2022 [facility]   BREAST BIOPSY Right 10/02/2022   [place] RT BREAST BX W LOC DEV 1ST LESION IMG BX SPEC US GUIDE 10/02/2022 [facility]   BREAST LUMPECTOMY Right 2003   BREAST LUMPECTOMY WITH RADIOACTIVE SEED LOCALIZATION Right 08/18/2022   Procedure: RIGHT BREAST LUMPECTOMY WITH RADIOACTIVE SEED LOCALIZATION;  Surgeon: Griselda Miner, MD;  Location: [facility];  Service: General;  Laterality: Right;   BREAST RECONSTRUCTION WITH PLACEMENT OF TISSUE EXPANDER AND FLEX HD (ACELLULAR HYDRATED DERMIS) Bilateral 11/17/2016   Procedure: IMEDITATE BREAST RECONSTRUCTION WITH PLACEMENT OF TISSUE EXPANDER AND FLEX HD (ACELLULAR HYDRATED DERMIS);  Surgeon: Peggye Form, DO;  Location: [facility];  Service: Plastics;  Laterality: Bilateral;   BREAST SURGERY  2003 /right   lumpectomy; chemotherapy and radiation   LIPOSUCTION WITH LIPOFILLING Bilateral 02/23/2020   Procedure: Fat filling of breasts for symmetry from abdomen;  Surgeon: Peggye Form, DO;  Location: [facility];  Service: Plastics;  Laterality: Bilateral;  90 min, please   LIPOSUCTION WITH LIPOFILLING Bilateral 05/16/2021   Procedure: LIPOSUCTION WITH LIPOFILLING;  Surgeon: Peggye Form, DO;  Location: [facility];  Service: Plastics;  Laterality: Bilateral;  1.5 hours   REMOVAL OF BILATERAL TISSUE EXPANDERS WITH PLACEMENT OF BILATERAL BREAST IMPLANTS Bilateral 02/11/2017   Procedure: REMOVAL OF BILATERAL TISSUE EXPANDERS WITH PLACEMENT OF BILATERAL SILICONE BREAST IMPLANTS;  Surgeon: Peggye Form, DO;  Location: [facility];  Service: Plastics;  Laterality: Bilateral;   TOTAL ABDOMINAL HYSTERECTOMY W/ BILATERAL SALPINGOOPHORECTOMY     for treatment of breast treatment   TOTAL MASTECTOMY Bilateral 11/17/2016   Procedure: RIGHT MASTECTOMY AND LEFT PROPHYLATIC MASTECTOMY;  Surgeon: Griselda Miner, MD;  Location:  [facility];  Service: General;  Laterality: Bilateral;    I have reviewed the social history and family history with the patient and they are unchanged from previous note.  ALLERGIES:  has No Known Allergies.  MEDICATIONS:  Current Outpatient Medications  Medication Sig Dispense Refill   atorvastatin (LIPITOR) 20 MG tablet Take 1 tablet (20 mg total) by mouth daily. 90 tablet 3   Calcium 500 MG CHEW Chew by mouth.     Collagenase POWD by Does not apply route.     diclofenac (VOLTAREN) 75 MG EC tablet Take 1 tablet (75 mg total) by mouth daily. 90 tablet 3   ELDERBERRY PO Take by mouth.     psyllium (METAMUCIL) 58.6 % powder Take 1 packet by mouth daily.     tamoxifen (NOLVADEX) 20 MG tablet TAKE 1 TABLET BY MOUTH EVERY DAY 90 tablet 1   Vitamin D, Cholecalciferol, 25 MCG (1000 UT) CAPS Take 1 capsule by mouth daily.     No current facility-administered medications for this visit.    PHYSICAL EXAMINATION: Not performed   LABORATORY DATA:  I have reviewed the data as listed    Latest Ref Rng & Units 07/10/2022   11:16 AM 05/26/2022    9:02 AM 12/30/2021    8:22 AM  CBC  WBC 4.0 - 10.5 K/uL 6.6  4.0  4.2   Hemoglobin  12.0 - 15.0 g/dL 16.1  09.6  04.5   Hematocrit 36.0 - 46.0 % 44.8  43.7  41.1   Platelets 150 - 400 K/uL 279  258.0  222         Latest Ref Rng & Units 07/10/2022   11:16 AM 05/26/2022    9:02 AM 12/30/2021    8:22 AM  CMP  Glucose 70 - 99 mg/dL 97  93  409   BUN 8 - 23 mg/dL 26  17  18    Creatinine 0.44 - 1.00 mg/dL 8.11  9.14  7.82   Sodium 135 - 145 mmol/L 141  141  141   Potassium 3.5 - 5.1 mmol/L 3.9  4.1  4.0   Chloride 98 - 111 mmol/L 103  104  104   CO2 22 - 32 mmol/L 30  28  30    Calcium 8.9 - 10.3 mg/dL 95.6  9.3  21.3   Total Protein 6.5 - 8.1 g/dL 7.5  6.7  7.2   Total Bilirubin 0.3 - 1.2 mg/dL 0.7  0.8  0.5   Alkaline Phos 38 - 126 U/L 77  70  108   AST 15 - 41 U/L 36  23  19   ALT 0 - 44 U/L 36  25  18       RADIOGRAPHIC STUDIES: I have  personally reviewed the radiological images as listed and agreed with the findings in the report. No results found.     I discussed the assessment and treatment plan with the patient. The patient was provided an opportunity to ask questions and all were answered. The patient agreed with the plan and demonstrated an understanding of the instructions.   The patient was advised to call back or seek an in-person evaluation if the symptoms worsen or if the condition fails to improve as anticipated.  I provided 8 minutes of non face-to-face telephone visit time during this encounter, and > 50% was spent counseling as documented under my assessment & plan.     Malachy Mood, MD 12/02/22

## 2022-12-03 ENCOUNTER — Other Ambulatory Visit: Payer: Self-pay

## 2022-12-16 ENCOUNTER — Inpatient Hospital Stay: Payer: BC Managed Care – PPO | Attending: Nurse Practitioner | Admitting: Nurse Practitioner

## 2022-12-16 ENCOUNTER — Encounter: Payer: Self-pay | Admitting: Nurse Practitioner

## 2022-12-16 VITALS — BP 130/89 | HR 70 | Temp 97.9°F | Resp 14 | Ht 64.0 in | Wt 140.9 lb

## 2022-12-16 DIAGNOSIS — Z79810 Long term (current) use of selective estrogen receptor modulators (SERMs): Secondary | ICD-10-CM | POA: Insufficient documentation

## 2022-12-16 DIAGNOSIS — Z9013 Acquired absence of bilateral breasts and nipples: Secondary | ICD-10-CM | POA: Insufficient documentation

## 2022-12-16 DIAGNOSIS — C50211 Malignant neoplasm of upper-inner quadrant of right female breast: Secondary | ICD-10-CM | POA: Diagnosis not present

## 2022-12-16 DIAGNOSIS — Z170 Estrogen receptor positive status [ER+]: Secondary | ICD-10-CM | POA: Insufficient documentation

## 2022-12-16 NOTE — Progress Notes (Signed)
CLINIC:  Survivorship   Patient Care Team: Natalia Leatherwood, DO as PCP - General (Family Medicine) Malachy Mood, MD as Consulting Physician (Hematology) Griselda Miner, MD as Consulting Physician (General Surgery) Antony Blackbird, MD as Consulting Physician (Radiation Oncology) Mardella Layman, MD as Consulting Physician (Gastroenterology) Richarda Overlie, MD as Consulting Physician (Obstetrics and Gynecology) Axel Filler, Larna Daughters, NP as Nurse Practitioner (Hematology and Oncology) Reather Littler, MD (Inactive) as Consulting Physician (Endocrinology) Pollyann Samples, NP as Nurse Practitioner (Nurse Practitioner)    REASON FOR VISIT:  Routine follow-up post-treatment for breast cancer recurrence.  BRIEF ONCOLOGIC HISTORY:  Oncology History Overview Note  Cancer Staging Malignant neoplasm of upper-inner quadrant of right breast in female, estrogen receptor positive (HCC) Staging form: Breast, AJCC 8th Edition - Clinical stage from 10/09/2016: Stage IA (cT1c, cN0, cM0, G3, ER: Positive, PR: Positive, HER2: Negative) - Signed by Malachy Mood, MD on 10/14/2016 - Pathologic stage from 11/17/2016: Stage IB (pT2, pN0, cM0, G3, ER: Positive, PR: Positive, HER2: Negative, Oncotype DX score: 21) - Signed by Malachy Mood, MD on 12/11/2016     Malignant neoplasm of upper-inner quadrant of right breast in female, estrogen receptor positive (HCC)  10/08/2016 Mammogram   Diagnostic mammogram and ultrasound showed an irregular mass in the right breast at 1 o'clock, 5 cm from the nipple measuring 1.6 x 1.4x 1.6 cm, axilla was negative for adenopathy.   10/09/2016 Receptors her2   Estrogen Receptor: 80%, POSITIVE, STRONG STAINING INTENSITY Progesterone Receptor: 30%, POSITIVE, STRONG STAINING INTENSITY Proliferation Marker Ki67: 35% HER2 NEGATIVE    10/09/2016 Initial Diagnosis   Malignant neoplasm of upper-inner quadrant of right breast in female, estrogen receptor positive (HCC)   10/09/2016 Initial Biopsy    Diagnosis Breast, right, needle core biopsy, upper inner quadrant, 1:00 o'clock - INVASIVE DUCTAL CARCINOMA, G3   10/2016 Genetic Testing   Genetic testing was negative for The genes analyzed were the 23 genes on [facility]'s Breast/GYN panel (ATM, BARD1, BRCA1, BRCA2, BRIP1, CDH1, CHEK2, DICER1, EPCAM, MLH1,  MSH2, MSH6, NBN, NF1, PALB2, PMS2, PTEN, RAD50, RAD51C, RAD51D,SMARCA4, STK11, and TP53).     11/17/2016 Surgery   RIGHT MASTECTOMY AND LEFT PROPHYLATIC MASTECTOMY by Dr. Carolynne Edouard  IMEDITATE BREAST RECONSTRUCTION WITH PLACEMENT OF TISSUE EXPANDER AND FLEX HD (ACELLULAR HYDRATED DERMIS) by Dr. Ulice Bold     11/17/2016 Pathology Results   Diagnosis 11/17/16 1. Breast, simple mastectomy, Left - BENIGN BREAST TISSUE WITH ECTATIC DUCTS. - ONE OF ONE LYMPH NODE NEGATIVE FOR CARCINOMA (0/1). 2. Breast, simple mastectomy, Right - INVASIVE DUCTAL CARCINOMA, GRADE 3, SPANNING 2.4 CM. - HIGH GRADE DUCTAL CARCINOMA IN SITU. - INVASIVE CARCINOMA IS <0.1 CM OF THE POSTERIOR MARGIN (BROADLY) AND ANTERIOR MARGIN (FOCALLY). - IN SITU CARCINOMA IS >0.2 CM OF BOTH MARGINS. - PRIOR LUMPECTOMY SITE, NEGATIVE FOR CARCINOMA. - SEE ONCOLOGY TABLE. 3. Breast, excision, Superior flap deep margin - BENIGN FIBROADIPOSE TISSUE.   11/17/2016 Miscellaneous   Oncotype on 11/17/16 Rcurence Risk Score of 21 10-Year risk of distant reucrrence with Tamoxifen alone is 13%    12/18/2016 - 12/30/2021 Anti-estrogen oral therapy   Adjuvant Letrozole 2.5 mg daily; began 12/18/16. Due to arthritis I switched her to Exemestane 25mg  daily in 11/2019.    12/24/2016 Imaging   BONE SCAN IMPRESSION: No evidence of osseous metastatic disease.   12/24/2016 Imaging   CT CAP IMPRESSION: 1. Status post bilateral modified radical mastectomy and breast reconstruction, as above. No findings to suggest metastatic disease in the chest, abdomen or pelvis.  2. Multiple heterogeneous appearing thyroid nodules, as  discussed above. These are nonspecific but further evaluation with nonemergent thyroid ultrasound is recommended in the near future to determine any potential need for further evaluation with fine-needle aspiration. 3. Incidental findings, as above.   02/11/2017 Surgery   REMOVAL OF BILATERAL TISSUE EXPANDERS WITH PLACEMENT OF BILATERAL SILICONE BREAST IMPLANTS by Dr. Ulice Bold    06/19/2022 Imaging   R AX [place]:  Targeted ultrasound is performed, showing a 5 x 2 x 2 mm irregular hypoechoic mass right breast 1 o'clock position 3 cm from nipple at the site of palpable concern. No right axillary adenopathy.   IMPRESSION: Indeterminate palpable mass right breast 1 o'clock position.   07/01/2022 Pathology Results   Recurrence/new   Breast, right, needle core biopsy, 1 o'clock, 3cmfn, hydromark clip INVASIVE DUCTAL CARCINOMA, RECURRENT OVERALL GRADE: 2  PROGNOSTIC INDICATORS The tumor cells are EQUIVOCAL for Her2 (2+).  Estrogen Receptor: 100%, POSITIVE, STRONG STAINING INTENSITY Progesterone Receptor: 100%, POSITIVE, STRONG STAINING INTENSITY Proliferation Marker Ki67: 20%  1A. Breast, righnt,needle core biopsy, 1 o'clock, 3 cmfn FLUORESCENCE IN-SITU HYBRIDIZATION Results: GROUP 5: HER2 **NEGATIVE**     08/18/2022 Definitive Surgery   A. BREAST, RIGHT, LUMPECTOMY:  - Skin with underlying benign fibroadipose tissue  - No evidence of malignancy   B. BREAST, RIGHT LATERAL MARGIN, EXCISION:  - Benign skeletal muscle and fibroadipose tissue  - No evidence of malignancy    10/02/2022 Pathology Results   Breast, right, needle core biopsy, upper inner, 1 o'clock, 3cmfn BENIGN BREAST TISSUE WITH FAT NECROSIS, GIANT CELL REACTIONS AND CYST WALL. NEGATIVE FOR ATYPIA OR MALIGNANCY.   10/13/2022 - 11/26/2022 Radiation Therapy   Adjuvant R breast radiation per Dr. Basilio Cairo   11/12/2022 Genetic Testing   Negative [facility] CancerNext-Expanded +RNAinsight Panel.  Report date is 11/12/2022.   The  CancerNext-Expanded gene panel offered by [facility] and includes sequencing, rearrangement, and RNA analysis for the following 71 genes:  AIP, ALK, APC, ATM, BAP1, BARD1, BMPR1A, BRCA1, BRCA2, BRIP1, CDC73, CDH1, CDK4, CDKN1B, CDKN2A, CHEK2, DICER1, FH, FLCN, KIF1B, LZTR1,MAX, MEN1, MET, MLH1, MSH2, MSH6, MUTYH, NF1, NF2, NTHL1, PALB2, PHOX2B, PMS2, POT1, PRKAR1A, PTCH1, PTEN, RAD51C,RAD51D, RB1, RET, SDHA, SDHAF2, SDHB, SDHC, SDHD, SMAD4, SMARCA4, SMARCB1, SMARCE1, STK11, SUFU, TMEM127, TP53,TSC1, TSC2 and VHL (sequencing and deletion/duplication); AXIN2, CTNNA1, EGFR, EGLN1, HOXB13, KIT, MITF, MSH3, PDGFRA, POLD1 and POLE (sequencing only); EPCAM and GREM1 (deletion/duplication only).    11/2022 -  Anti-estrogen oral therapy   Adjuvant Tamoxifen, starting 11/2022, goal 5 years   12/16/2022 Survivorship   SCP delivered by Santiago Glad, NP     INTERVAL HISTORY:  Olivia [name] presents to the Survivorship Clinic today to review her survivorship care plan detailing her treatment course for breast cancer, as well as monitoring long-term side effects of that treatment, education regarding health maintenance, screening, and overall wellness and health promotion.     Overall, Olivia [name] is still tired but doing well. She is walking 5-5.5 miles and plans to start exercise class soon. She gets winded quickly but denies chest pain, dyspnea, cough. Skin feels rough, vit E helps the best. Started Tamoxifen 11/4, tolerating well thus far.    REVIEW OF SYSTEMS:  Review of Systems - Oncology Breast: Denies any new nodularity, masses, tenderness, nipple changes, or nipple discharge.      ONCOLOGY TREATMENT TEAM:  1. Surgeon:  Dr. Carolynne Ed[facility]ard at [facility] 2. Medical Oncologist: Dr. Mosetta Putt  3. Radiation Oncologist: Dr. Basilio Cairo    PAST MEDICAL/SURGICAL  HISTORY:  Past Medical History:  Diagnosis Date   Adjustment insomnia 02/23/2019   Breast cancer (HCC) 2018   Lumpectomy, chemotherapy and  radiation with recurrence 2018. Now on Femara   Cervical radiculopathy 07/05/2019   COVID-19 02/2019   Her husband also passed away at this time from COVID-19.   Family history of breast cancer    Genetic testing 10/30/2016   Breast/GYN panel (23 genes) @ [facility] - No pathogenic mutations detected   GERD (gastroesophageal reflux disease)    OTC medication   Grief reaction 02/23/2019   Heart murmur    as a child - Echo in early 20s "ok"   History of echocardiogram 05/2015   Echo 4/17: EF 55-60%, GLS -19.6%, normal wall motion   History of exercise stress test 05/2015   ETT 4/17 - No ST changes   HLD (hyperlipidemia)    Hypothyroidism    Multiple thyroid nodules 2009   Multiple nodules: Normal ultrasound reported at diagnosis   Osteoporosis    Personal history of chemotherapy    Personal history of radiation therapy    UNEQUAL LEG LENGTH, ACQUIRED 11/17/2006   Qualifier: Diagnosis of  By: Darrick Penna MD, KARL     Past Surgical History:  Procedure Laterality Date   ABDOMINAL HYSTERECTOMY     BREAST BIOPSY Right 07/01/2022   [place] RT BREAST BX W LOC DEV 1ST LESION IMG BX SPEC US GUIDE 07/01/2022 [facility]   BREAST BIOPSY  08/15/2022   [place] RT RADIOACTIVE SEED LOC 08/15/2022 [facility]   BREAST BIOPSY Right 10/02/2022   [place] RT BREAST BX W LOC DEV 1ST LESION IMG BX SPEC US GUIDE 10/02/2022 [facility]   BREAST LUMPECTOMY Right 2003   BREAST LUMPECTOMY WITH RADIOACTIVE SEED LOCALIZATION Right 08/18/2022   Procedure: RIGHT BREAST LUMPECTOMY WITH RADIOACTIVE SEED LOCALIZATION;  Surgeon: Griselda Miner, MD;  Location: [facility];  Service: General;  Laterality: Right;   BREAST RECONSTRUCTION WITH PLACEMENT OF TISSUE EXPANDER AND FLEX HD (ACELLULAR HYDRATED DERMIS) Bilateral 11/17/2016   Procedure: IMEDITATE BREAST RECONSTRUCTION WITH PLACEMENT OF TISSUE EXPANDER AND FLEX HD (ACELLULAR HYDRATED DERMIS);  Surgeon: Peggye Form, DO;  Location: [facility];  Service:  Plastics;  Laterality: Bilateral;   BREAST SURGERY  2003 /right   lumpectomy; chemotherapy and radiation   LIPOSUCTION WITH LIPOFILLING Bilateral 02/23/2020   Procedure: Fat filling of breasts for symmetry from abdomen;  Surgeon: Peggye Form, DO;  Location: [facility];  Service: Plastics;  Laterality: Bilateral;  90 min, please   LIPOSUCTION WITH LIPOFILLING Bilateral 05/16/2021   Procedure: LIPOSUCTION WITH LIPOFILLING;  Surgeon: Peggye Form, DO;  Location: [facility];  Service: Plastics;  Laterality: Bilateral;  1.5 hours   REMOVAL OF BILATERAL TISSUE EXPANDERS WITH PLACEMENT OF BILATERAL BREAST IMPLANTS Bilateral 02/11/2017   Procedure: REMOVAL OF BILATERAL TISSUE EXPANDERS WITH PLACEMENT OF BILATERAL SILICONE BREAST IMPLANTS;  Surgeon: Peggye Form, DO;  Location:  SURGERY CENTER;  Service: Plastics;  Laterality: Bilateral;   TOTAL ABDOMINAL HYSTERECTOMY W/ BILATERAL SALPINGOOPHORECTOMY     for treatment of breast treatment   TOTAL MASTECTOMY Bilateral 11/17/2016   Procedure: RIGHT MASTECTOMY AND LEFT PROPHYLATIC MASTECTOMY;  Surgeon: Griselda Miner, MD;  Location: [facility];  Service: General;  Laterality: Bilateral;     ALLERGIES:  No Known Allergies   CURRENT MEDICATIONS:  Outpatient Encounter Medications as of 12/16/2022  Medication Sig   atorvastatin (LIPITOR) 20 MG tablet Take 1 tablet (20 mg  total) by mouth daily.   Calcium 500 MG CHEW Chew by mouth.   Collagenase POWD by Does not apply route.   diclofenac (VOLTAREN) 75 MG EC tablet Take 1 tablet (75 mg total) by mouth daily.   ELDERBERRY PO Take by mouth.   psyllium (METAMUCIL) 58.6 % powder Take 1 packet by mouth daily.   tamoxifen (NOLVADEX) 20 MG tablet TAKE 1 TABLET BY MOUTH EVERY DAY   Vitamin D, Cholecalciferol, 25 MCG (1000 UT) CAPS Take 1 capsule by mouth daily.   No facility-administered encounter medications on file as of 12/16/2022.     ONCOLOGIC  FAMILY HISTORY:  Family History  Problem Relation Age of Onset   Coronary artery disease Mother        1ST DEGREE RELATIVE   Heart attack Mother 28   Skin cancer Mother        unk type   Diabetes Father    Kidney disease Father    Prostate cancer Father 59       deceased 81   Cancer Brother 42       multiple myeloma    Lung cancer Maternal Uncle        x2 mat uncles; dx 57s-70s   Breast cancer Paternal Aunt 64   Breast cancer Cousin        x2 pat female cousins; dx 73s   Esophageal cancer Cousin 76       pat female cousin   Diabetes Other        1ST DEGREE RELATIVE   Colon cancer Neg Hx    Colon polyps Neg Hx    Stomach cancer Neg Hx    Rectal cancer Neg Hx      GENETIC COUNSELING/TESTING: Yes, negative in 2018 and 2024  SOCIAL HISTORY:  Olivia [name] is widowed. She is not working. She travels often and is very active.  She denies any current or history of tobacco, alcohol, or illicit drug use.     PHYSICAL EXAMINATION:  Vital Signs:   Vitals:   12/16/22 0934  BP: 130/89  Pulse: 70  Resp: 14  Temp: 97.9 F (36.6 C)  SpO2: 99%   Filed Weights   12/16/22 0934  Weight: 140 lb 14.4 oz (63.9 kg)   General: Well-nourished, well-appearing female in no acute distress.   HEENT:  Sclerae anicteric.  Lymph: No cervical, supraclavicular, or infraclavicular lymphadenopathy noted on palpation.  Respiratory: breathing non-labored.  GI: Abdomen soft and round; non-tender, non-distended. Bowel sounds normoactive.  Neuro: No focal deficits. Steady gait.  Psych: Mood and affect normal and appropriate for situation.  Extremities: No edema. MSK: No focal spinal tenderness to palpation.  Full range of motion in bilateral upper extremities Breast: s/p bilateral mastectomy and reconstruction with nipple tattoos and recent R lumpectomy and RT. Incisions all healed. Mild R breast hyperpigmentation and dermatitis   LABORATORY DATA:  None for this visit.  DIAGNOSTIC IMAGING:   None for this visit.      ASSESSMENT AND PLAN:  Ms.. [name] is a pleasant 62 y.o. female with recurrent right breast invasive ductal carcinoma, ER+/PR+/HER2-, initially diagnosed in 2018 with recurrence in 07/2022, treated with bilateral mastectomy/reconstruction and AI but more recently with R lumpectomy, adjuvant radiation therapy, and anti-estrogen therapy with Tamoxifen beginning in 12/2022.  She presents to the Survivorship Clinic for routine follow-up post-completion of treatment for breast cancer.    1. Recurrent right breast cancer:  Olivia [name] is continuing to recover from definitive treatment for  breast cancer. Continues topical vit E for radiation dermatitis. Fatigue is improving with more activity. She will follow-up with her medical oncologist, Dr. Mosetta Putt in 4 months with history and physical exam per surveillance protocol.  She will continue her anti-estrogen therapy with Tamoxifen. Thus far, she is tolerating well, with minimal side effects.  Today, a comprehensive survivorship care plan and treatment summary was reviewed with the patient today detailing her breast cancer diagnosis, treatment course, potential late/long-term effects of treatment, appropriate follow-up care with recommendations for the future, and patient education resources.  A copy of this summary, along with a letter will be sent to the patient's primary care provider via mail/fax/In Basket message after today's visit.    2. Bone health:  Given Olivia [name]'s age/history of breast cancer, previous AI use and worsening osteopenia on Prolia, she is currently completing a course of zometa (q6 mo x4 doses). She is also on tamoxifen which has bone strengthening qualities. She was encouraged to continue calcium, vit D, and weight bearing.  She was given education on specific activities to promote bone health.  3. Cancer screening:  Due to Olivia [name]'s history and her age, she should receive screening for skin and colon cancers.  She is up to date. S/p total hyst. The information and recommendations are listed on the patient's comprehensive care plan/treatment summary and were reviewed in detail with the patient.    4. Health maintenance and wellness promotion: Olivia [name] was encouraged to consume 5-7 servings of fruits and vegetables per day. She was also encouraged to engage in moderate to vigorous exercise for 30 minutes per day most days of the week.  She was instructed to limit her alcohol consumption and continue to abstain from tobacco use.   5. Support services/counseling: It is not uncommon for this period of the patient's cancer care trajectory to be one of many emotions and stressors.  We discussed an opportunity for her to participate in the next session of [facility] ("Finding Your New Normal") support group series designed for patients after they have completed treatment.   Olivia [name] was encouraged to take advantage of our many other support services programs, support groups, and/or counseling in coping with her new life as a cancer survivor after completing anti-cancer treatment.  She was offered support today through active listening and expressive supportive counseling.  She was given information regarding our available services and encouraged to contact me with any questions or for help enrolling in any of our support group/programs.    Dispo:   -Continue Tamoxifen -Next lab and zometa 01/13/23, last dose 07/2023 -F/up with Dr. Mosetta Putt in 4 months -Follow up with surgery as indicated -She is welcome to return back to the Survivorship Clinic at any time; no additional follow-up needed at this time.  -Consider referral back to survivorship as a long-term survivor for continued surveillance  A total of (30) minutes of face-to-face time was spent with this patient with greater than 50% of that time in counseling and care-coordination.   Santiago Glad, NP Survivorship Program [facility] [phone]   Note: PRIMARY CARE PROVIDER Natalia Leatherwood, [place] [phone]

## 2022-12-23 ENCOUNTER — Encounter: Payer: BC Managed Care – PPO | Admitting: Nurse Practitioner

## 2022-12-24 DIAGNOSIS — H5211 Myopia, right eye: Secondary | ICD-10-CM | POA: Diagnosis not present

## 2022-12-24 DIAGNOSIS — H2513 Age-related nuclear cataract, bilateral: Secondary | ICD-10-CM | POA: Diagnosis not present

## 2023-01-08 ENCOUNTER — Encounter: Payer: Self-pay | Admitting: Radiation Oncology

## 2023-01-08 ENCOUNTER — Ambulatory Visit
Admission: RE | Admit: 2023-01-08 | Discharge: 2023-01-08 | Disposition: A | Discharge status: Home / Self Care | Payer: BC Managed Care – PPO | Source: Ambulatory Visit | Attending: Radiation Oncology | Admitting: Radiation Oncology

## 2023-01-08 NOTE — Progress Notes (Signed)
Patient identity verified x2  Patient is here today for follow up post radiation to the RT breast.  Diagnosis: C50.211 Malignant neoplasm of upper-inner quadrant of right female breast.  They completed their radiation on: 11/26/2022   Does the patient complain of any of the following: Post radiation skin issues: None Breast Tenderness: None Breast Swelling: None Lymphadema: None Range of Motion limitations: None Fatigue post radiation: None Appetite good/fair/poor: Good Hormone Therapy Start: Adjuvant Letrozole 2.5 mg daily; began 12/18/16   Additional comments if applicable:   This concludes the interaction.  Ruel Favors, LPN

## 2023-01-09 ENCOUNTER — Other Ambulatory Visit: Payer: Self-pay

## 2023-01-12 ENCOUNTER — Other Ambulatory Visit: Payer: Self-pay

## 2023-01-12 DIAGNOSIS — L821 Other seborrheic keratosis: Secondary | ICD-10-CM | POA: Diagnosis not present

## 2023-01-12 DIAGNOSIS — L814 Other melanin hyperpigmentation: Secondary | ICD-10-CM | POA: Diagnosis not present

## 2023-01-12 DIAGNOSIS — C50211 Malignant neoplasm of upper-inner quadrant of right female breast: Secondary | ICD-10-CM

## 2023-01-12 DIAGNOSIS — D225 Melanocytic nevi of trunk: Secondary | ICD-10-CM | POA: Diagnosis not present

## 2023-01-12 DIAGNOSIS — L578 Other skin changes due to chronic exposure to nonionizing radiation: Secondary | ICD-10-CM | POA: Diagnosis not present

## 2023-01-13 ENCOUNTER — Inpatient Hospital Stay: Payer: BC Managed Care – PPO | Attending: Nurse Practitioner

## 2023-01-13 ENCOUNTER — Inpatient Hospital Stay: Payer: BC Managed Care – PPO

## 2023-01-13 ENCOUNTER — Other Ambulatory Visit: Payer: BC Managed Care – PPO

## 2023-01-13 ENCOUNTER — Ambulatory Visit: Payer: BC Managed Care – PPO | Admitting: Hematology

## 2023-01-13 VITALS — BP 140/93 | HR 69 | Temp 98.7°F | Resp 16

## 2023-01-13 DIAGNOSIS — M8000XD Age-related osteoporosis with current pathological fracture, unspecified site, subsequent encounter for fracture with routine healing: Secondary | ICD-10-CM

## 2023-01-13 DIAGNOSIS — Z79810 Long term (current) use of selective estrogen receptor modulators (SERMs): Secondary | ICD-10-CM | POA: Insufficient documentation

## 2023-01-13 DIAGNOSIS — Z170 Estrogen receptor positive status [ER+]: Secondary | ICD-10-CM | POA: Diagnosis not present

## 2023-01-13 DIAGNOSIS — C50211 Malignant neoplasm of upper-inner quadrant of right female breast: Secondary | ICD-10-CM | POA: Insufficient documentation

## 2023-01-13 DIAGNOSIS — M810 Age-related osteoporosis without current pathological fracture: Secondary | ICD-10-CM | POA: Diagnosis not present

## 2023-01-13 LAB — CBC WITH DIFFERENTIAL (CANCER CENTER ONLY)
Abs Immature Granulocytes: 0.01 10*3/uL (ref 0.00–0.07)
Basophils Absolute: 0 10*3/uL (ref 0.0–0.1)
Basophils Relative: 1 %
Eosinophils Absolute: 0 10*3/uL (ref 0.0–0.5)
Eosinophils Relative: 1 %
HCT: 42.9 % (ref 36.0–46.0)
Hemoglobin: 14.4 g/dL (ref 12.0–15.0)
Immature Granulocytes: 0 %
Lymphocytes Relative: 21 %
Lymphs Abs: 1.3 10*3/uL (ref 0.7–4.0)
MCH: 29.9 pg (ref 26.0–34.0)
MCHC: 33.6 g/dL (ref 30.0–36.0)
MCV: 89.2 fL (ref 80.0–100.0)
Monocytes Absolute: 0.4 10*3/uL (ref 0.1–1.0)
Monocytes Relative: 6 %
Neutro Abs: 4.4 10*3/uL (ref 1.7–7.7)
Neutrophils Relative %: 71 %
Platelet Count: 246 10*3/uL (ref 150–400)
RBC: 4.81 MIL/uL (ref 3.87–5.11)
RDW: 13.4 % (ref 11.5–15.5)
WBC Count: 6.2 10*3/uL (ref 4.0–10.5)
nRBC: 0 % (ref 0.0–0.2)

## 2023-01-13 LAB — CMP (CANCER CENTER ONLY)
ALT: 15 U/L (ref 0–44)
AST: 19 U/L (ref 15–41)
Albumin: 4.7 g/dL (ref 3.5–5.0)
Alkaline Phosphatase: 57 U/L (ref 38–126)
Anion gap: 7 (ref 5–15)
BUN: 22 mg/dL (ref 8–23)
CO2: 29 mmol/L (ref 22–32)
Calcium: 9.9 mg/dL (ref 8.9–10.3)
Chloride: 104 mmol/L (ref 98–111)
Creatinine: 1.11 mg/dL — ABNORMAL HIGH (ref 0.44–1.00)
GFR, Estimated: 57 mL/min — ABNORMAL LOW (ref >60)
Glucose, Bld: 104 mg/dL — ABNORMAL HIGH (ref 70–99)
Potassium: 3.7 mmol/L (ref 3.5–5.1)
Sodium: 140 mmol/L (ref 135–145)
Total Bilirubin: 0.6 mg/dL (ref <1.2)
Total Protein: 7.1 g/dL (ref 6.5–8.1)

## 2023-01-13 MED ORDER — SODIUM CHLORIDE 0.9 % IV SOLN: Freq: Once | INTRAVENOUS | Status: AC

## 2023-01-13 MED ORDER — ZOLEDRONIC ACID 4 MG/100ML IV SOLN
4.0000 mg | Freq: Once | INTRAVENOUS | Status: AC
  Administered 2023-01-13: 4 mg via INTRAVENOUS
  Filled 2023-01-13: qty 100

## 2023-01-13 NOTE — Patient Instructions (Signed)

## 2023-02-02 ENCOUNTER — Ambulatory Visit (INDEPENDENT_AMBULATORY_CARE_PROVIDER_SITE_OTHER): Payer: BC Managed Care – PPO | Admitting: Family Medicine

## 2023-02-02 VITALS — BP 146/97 | HR 61 | Wt 142.4 lb

## 2023-02-02 DIAGNOSIS — J209 Acute bronchitis, unspecified: Secondary | ICD-10-CM | POA: Diagnosis not present

## 2023-02-02 DIAGNOSIS — J069 Acute upper respiratory infection, unspecified: Secondary | ICD-10-CM | POA: Diagnosis not present

## 2023-02-02 MED ORDER — PREDNISONE 20 MG PO TABS: ORAL_TABLET | ORAL | 0 refills | Status: DC

## 2023-02-02 NOTE — Progress Notes (Signed)
OFFICE VISIT  02/02/2023  CC:  Chief Complaint  Patient presents with   Cough    Over a week; cough, chest/head congestion, HA, body aches. Denies fever. Mucinex and Robitussin. Just got back from traveling out of the country.     Patient is a 62 y.o. female who presents for cough.  HPI: Onset about 1 week ago, cough and some nasal congestion with postnasal drip. Says the cough is rattly and she has heard some intermittent wheezing.  Denies shortness of breath, chest tightness, chest pain, or fever. The cough is not keeping her up at night. She does feel tired. This started at the end of a trip to [place]. She is using over-the-counter Mucinex cold formula.  She does not smoke.  She has no history of asthma.   Past Medical History:  Diagnosis Date   Adjustment insomnia 02/23/2019   Breast cancer (HCC) 2018   Lumpectomy, chemotherapy and radiation with recurrence 2018. Now on Femara   Cervical radiculopathy 07/05/2019   COVID-19 02/2019   Her husband also passed away at this time from COVID-19.   Family history of breast cancer    Genetic testing 10/30/2016   Breast/GYN panel (23 genes) @ [facility] - No pathogenic mutations detected   GERD (gastroesophageal reflux disease)    OTC medication   Grief reaction 02/23/2019   Heart murmur    as a child - Echo in early 20s "ok"   History of echocardiogram 05/2015   Echo 4/17: EF 55-60%, GLS -19.6%, normal wall motion   History of exercise stress test 05/2015   ETT 4/17 - No ST changes   HLD (hyperlipidemia)    Hypothyroidism    Multiple thyroid nodules 2009   Multiple nodules: Normal ultrasound reported at diagnosis   Osteoporosis    Personal history of chemotherapy    Personal history of radiation therapy    UNEQUAL LEG LENGTH, ACQUIRED 11/17/2006   Qualifier: Diagnosis of  By: Darrick Penna MD, KARL      Past Surgical History:  Procedure Laterality Date   ABDOMINAL HYSTERECTOMY     BREAST BIOPSY Right 07/01/2022   [place] RT  BREAST BX W LOC DEV 1ST LESION IMG BX SPEC US GUIDE 07/01/2022 [facility]   BREAST BIOPSY  08/15/2022   [place] RT RADIOACTIVE SEED LOC 08/15/2022 [facility]   BREAST BIOPSY Right 10/02/2022   [place] RT BREAST BX W LOC DEV 1ST LESION IMG BX SPEC US GUIDE 10/02/2022 [facility]   BREAST LUMPECTOMY Right 2003   BREAST LUMPECTOMY WITH RADIOACTIVE SEED LOCALIZATION Right 08/18/2022   Procedure: RIGHT BREAST LUMPECTOMY WITH RADIOACTIVE SEED LOCALIZATION;  Surgeon: Griselda Miner, MD;  Location: [facility];  Service: General;  Laterality: Right;   BREAST RECONSTRUCTION WITH PLACEMENT OF TISSUE EXPANDER AND FLEX HD (ACELLULAR HYDRATED DERMIS) Bilateral 11/17/2016   Procedure: IMEDITATE BREAST RECONSTRUCTION WITH PLACEMENT OF TISSUE EXPANDER AND FLEX HD (ACELLULAR HYDRATED DERMIS);  Surgeon: Peggye Form, DO;  Location: [facility];  Service: Plastics;  Laterality: Bilateral;   BREAST SURGERY  2003 /right   lumpectomy; chemotherapy and radiation   LIPOSUCTION WITH LIPOFILLING Bilateral 02/23/2020   Procedure: Fat filling of breasts for symmetry from abdomen;  Surgeon: Peggye Form, DO;  Location: [facility];  Service: Plastics;  Laterality: Bilateral;  90 min, please   LIPOSUCTION WITH LIPOFILLING Bilateral 05/16/2021   Procedure: LIPOSUCTION WITH LIPOFILLING;  Surgeon: Peggye Form, DO;  Location: [facility];  Service: Plastics;  Laterality: Bilateral;  1.5 hours   REMOVAL OF BILATERAL TISSUE EXPANDERS WITH PLACEMENT OF BILATERAL BREAST IMPLANTS Bilateral 02/11/2017   Procedure: REMOVAL OF BILATERAL TISSUE EXPANDERS WITH PLACEMENT OF BILATERAL SILICONE BREAST IMPLANTS;  Surgeon: Peggye Form, DO;  Location: [facility];  Service: Plastics;  Laterality: Bilateral;   TOTAL ABDOMINAL HYSTERECTOMY W/ BILATERAL SALPINGOOPHORECTOMY     for treatment of breast treatment   TOTAL MASTECTOMY Bilateral 11/17/2016   Procedure:  RIGHT MASTECTOMY AND LEFT PROPHYLATIC MASTECTOMY;  Surgeon: Chevis Pretty III, MD;  Location: [facility];  Service: General;  Laterality: Bilateral;    Outpatient Medications Prior to Visit  Medication Sig Dispense Refill   atorvastatin (LIPITOR) 20 MG tablet Take 1 tablet (20 mg total) by mouth daily. 90 tablet 3   Calcium 500 MG CHEW Chew by mouth.     Collagenase POWD by Does not apply route.     diclofenac (VOLTAREN) 75 MG EC tablet Take 1 tablet (75 mg total) by mouth daily. 90 tablet 3   ELDERBERRY PO Take by mouth.     psyllium (METAMUCIL) 58.6 % powder Take 1 packet by mouth daily.     tamoxifen (NOLVADEX) 20 MG tablet TAKE 1 TABLET BY MOUTH EVERY DAY 90 tablet 1   Vitamin D, Cholecalciferol, 25 MCG (1000 UT) CAPS Take 1 capsule by mouth daily.     No facility-administered medications prior to visit.    No Known Allergies  Review of Systems  As per HPI  PE:    02/02/2023    4:05 PM 01/13/2023    3:07 PM 01/08/2023   10:12 AM  Vitals with BMI  Height   5\' 4"   Weight 142 lbs 6 oz  140 lbs  BMI   24.02  Systolic 146 140   Diastolic 97 93   Pulse 61 69    Physical Exam  VS: noted--normal. Gen: alert, NAD, NONTOXIC APPEARING. HEENT: eyes without injection, drainage, or swelling.  Ears: EACs clear, TMs with normal light reflex and landmarks.  Nose: Clear rhinorrhea, with some dried, crusty exudate adherent to mildly injected mucosa.  No purulent d/c.  No paranasal sinus TTP.  No facial swelling.  Throat and mouth without focal lesion.  No pharyngial swelling, erythema, or exudate.   Neck: supple, no LAD.   LUNGS: CTA bilat, nonlabored resps.   CV: RRR, no m/r/g. EXT: no c/c/e SKIN: no rash   LABS:  Last CBC Lab Results  Component Value Date   WBC 6.2 01/13/2023   HGB 14.4 01/13/2023   HCT 42.9 01/13/2023   MCV 89.2 01/13/2023   MCH 29.9 01/13/2023   RDW 13.4 01/13/2023   PLT 246 01/13/2023   Last metabolic panel Lab Results  Component Value Date   GLUCOSE 104  (H) 01/13/2023   NA 140 01/13/2023   K 3.7 01/13/2023   CL 104 01/13/2023   CO2 29 01/13/2023   BUN 22 01/13/2023   CREATININE 1.11 (H) 01/13/2023   GFRNONAA 57 (L) 01/13/2023   CALCIUM 9.9 01/13/2023   PROT 7.1 01/13/2023   ALBUMIN 4.7 01/13/2023   BILITOT 0.6 01/13/2023   ALKPHOS 57 01/13/2023   AST 19 01/13/2023   ALT 15 01/13/2023   ANIONGAP 7 01/13/2023   IMPRESSION AND PLAN:  URI with acute bronchitis. Suspect viral etiology. Discussed option of continuing over-the-counter treatment and giving this more time to resolve versus trial of 5 days of prednisone 40 mg a day. She chose the latter--> prescription sent today.  Continue over-the-counter symptomatic care.  An After Visit Summary was printed and given to the patient.  FOLLOW UP: Return if symptoms worsen or fail to improve.  Signed:  Santiago Bumpers, MD           02/02/2023

## 2023-02-25 DIAGNOSIS — Z719 Counseling, unspecified: Secondary | ICD-10-CM

## 2023-03-09 ENCOUNTER — Other Ambulatory Visit: Payer: Self-pay

## 2023-03-09 ENCOUNTER — Encounter (HOSPITAL_BASED_OUTPATIENT_CLINIC_OR_DEPARTMENT_OTHER): Payer: Self-pay | Admitting: Urology

## 2023-03-09 DIAGNOSIS — Y9301 Activity, walking, marching and hiking: Secondary | ICD-10-CM | POA: Diagnosis not present

## 2023-03-09 DIAGNOSIS — S61412A Laceration without foreign body of left hand, initial encounter: Secondary | ICD-10-CM | POA: Diagnosis not present

## 2023-03-09 DIAGNOSIS — S61452A Open bite of left hand, initial encounter: Secondary | ICD-10-CM | POA: Diagnosis not present

## 2023-03-09 DIAGNOSIS — Z853 Personal history of malignant neoplasm of breast: Secondary | ICD-10-CM | POA: Diagnosis not present

## 2023-03-09 DIAGNOSIS — Y92830 Public park as the place of occurrence of the external cause: Secondary | ICD-10-CM | POA: Insufficient documentation

## 2023-03-09 DIAGNOSIS — Z203 Contact with and (suspected) exposure to rabies: Secondary | ICD-10-CM | POA: Diagnosis not present

## 2023-03-09 DIAGNOSIS — W540XXA Bitten by dog, initial encounter: Secondary | ICD-10-CM | POA: Diagnosis not present

## 2023-03-09 DIAGNOSIS — Z2914 Encounter for prophylactic rabies immune globin: Secondary | ICD-10-CM | POA: Diagnosis not present

## 2023-03-09 DIAGNOSIS — Z5321 Procedure and treatment not carried out due to patient leaving prior to being seen by health care provider: Secondary | ICD-10-CM | POA: Diagnosis not present

## 2023-03-09 DIAGNOSIS — S60922A Unspecified superficial injury of left hand, initial encounter: Secondary | ICD-10-CM | POA: Diagnosis not present

## 2023-03-09 DIAGNOSIS — Z23 Encounter for immunization: Secondary | ICD-10-CM | POA: Diagnosis not present

## 2023-03-09 NOTE — ED Triage Notes (Signed)
Pt states was walking in the park and a dog bit her left hand  Unknown vaccination status of dog  Dog was playful, was not acting viscous. Was with owner on leash  Unknown TDAP  Approx 3 cm lac, bleeding controlled, well approximated

## 2023-03-10 ENCOUNTER — Emergency Department (HOSPITAL_BASED_OUTPATIENT_CLINIC_OR_DEPARTMENT_OTHER): Payer: BC Managed Care – PPO

## 2023-03-10 ENCOUNTER — Emergency Department (HOSPITAL_BASED_OUTPATIENT_CLINIC_OR_DEPARTMENT_OTHER)
Admission: EM | Admit: 2023-03-10 | Discharge: 2023-03-10 | Disposition: A | Discharge status: Home / Self Care | Payer: BC Managed Care – PPO | Attending: Emergency Medicine | Admitting: Emergency Medicine

## 2023-03-10 ENCOUNTER — Other Ambulatory Visit (HOSPITAL_BASED_OUTPATIENT_CLINIC_OR_DEPARTMENT_OTHER): Payer: Self-pay

## 2023-03-10 ENCOUNTER — Emergency Department (HOSPITAL_BASED_OUTPATIENT_CLINIC_OR_DEPARTMENT_OTHER)
Admission: EM | Admit: 2023-03-10 | Discharge: 2023-03-10 | Discharge status: Against Medical Advice | Payer: BC Managed Care – PPO | Attending: Emergency Medicine | Admitting: Emergency Medicine

## 2023-03-10 ENCOUNTER — Ambulatory Visit: Payer: Self-pay | Admitting: Family Medicine

## 2023-03-10 ENCOUNTER — Encounter (HOSPITAL_BASED_OUTPATIENT_CLINIC_OR_DEPARTMENT_OTHER): Payer: Self-pay | Admitting: Emergency Medicine

## 2023-03-10 DIAGNOSIS — Z203 Contact with and (suspected) exposure to rabies: Secondary | ICD-10-CM | POA: Insufficient documentation

## 2023-03-10 DIAGNOSIS — Z23 Encounter for immunization: Secondary | ICD-10-CM | POA: Insufficient documentation

## 2023-03-10 DIAGNOSIS — Z2914 Encounter for prophylactic rabies immune globin: Secondary | ICD-10-CM | POA: Insufficient documentation

## 2023-03-10 DIAGNOSIS — W540XXA Bitten by dog, initial encounter: Secondary | ICD-10-CM | POA: Insufficient documentation

## 2023-03-10 DIAGNOSIS — S61452A Open bite of left hand, initial encounter: Secondary | ICD-10-CM | POA: Insufficient documentation

## 2023-03-10 DIAGNOSIS — S61412A Laceration without foreign body of left hand, initial encounter: Secondary | ICD-10-CM | POA: Diagnosis not present

## 2023-03-10 DIAGNOSIS — Z853 Personal history of malignant neoplasm of breast: Secondary | ICD-10-CM | POA: Insufficient documentation

## 2023-03-10 DIAGNOSIS — M1812 Unilateral primary osteoarthritis of first carpometacarpal joint, left hand: Secondary | ICD-10-CM | POA: Diagnosis not present

## 2023-03-10 MED ORDER — RABIES IMMUNE GLOBULIN 150 UNIT/ML IM INJ
20.0000 [IU]/kg | INJECTION | Freq: Once | INTRAMUSCULAR | Status: AC
  Administered 2023-03-10: 1275 [IU] via INTRAMUSCULAR
  Filled 2023-03-10: qty 10

## 2023-03-10 MED ORDER — RABIES VACCINE, PCEC IM SUSR
1.0000 mL | Freq: Once | INTRAMUSCULAR | Status: AC
  Administered 2023-03-10: 1 mL via INTRAMUSCULAR
  Filled 2023-03-10: qty 1

## 2023-03-10 MED ORDER — AMOXICILLIN-POT CLAVULANATE 875-125 MG PO TABS
1.0000 | ORAL_TABLET | Freq: Once | ORAL | Status: AC
  Administered 2023-03-10: 1 via ORAL
  Filled 2023-03-10: qty 1

## 2023-03-10 MED ORDER — AMOXICILLIN-POT CLAVULANATE 875-125 MG PO TABS
1.0000 | ORAL_TABLET | Freq: Two times a day (BID) | ORAL | 0 refills | Status: DC
  Filled 2023-03-10: qty 14, 7d supply, fill #0

## 2023-03-10 NOTE — Telephone Encounter (Signed)
  Chief Complaint: dog bite Symptoms: L hand posterior punctures Frequency: yesterday 1730 Pertinent Negatives: Patient denies uncontrolled bleeding  Disposition: [] ED /[] Urgent Care (no appt availability in office) / [] Appointment(In office/virtual)/ []  [facility]/ [] Home Care/ [] Refused Recommended Disposition /[] [name]/ []  Follow-up with PCP  Additional Notes: Pt advised she is unsure of the vax status of the dog, states the puppy was about 60 months old and acting normal. Per pt the dog came up to her and bit her. Per pt the bleeding is controlled at this time. Pt states that she is concerned as she has had some lymph node removal in that arm. Pt advised to seek ED treatment. Pt agreeable.   Copied from CRM [id number]. Topic: Clinical - Red Word Triage >> Mar 10, 2023  8:05 AM Deaijah H wrote: Red Word that prompted transfer to Nurse Triage: Bit by puppy Reason for Disposition  [1] Any break in skin from BITE (e.g., cut, puncture or scratch) AND[2] PET animal (e.g., dog, cat, or ferret) at risk for RABIES (e.g., sick, stray, unprovoked bite, developing country)  Answer Assessment - Initial Assessment Questions 1. ANIMAL: What type of animal caused the bite? Is the injury from a bite or a claw? If the animal is a dog or a cat, ask: Was it a pet or a stray? Was it acting ill or behaving strangely?     Acting normal, young, less than 4 months old 2. LOCATION: Where is the bite located?      L hand 3. SIZE: How big is the bite? What does it look like?      1.5 long 4. ONSET: When did the bite happen? (Minutes or hours ago)      Yesterday evening 1730 5. CIRCUMSTANCES: Tell me how this happened.      Just walking and dog jumped and bit me 6. TETANUS: When was your last tetanus booster?     Unsure 7. RABIES VACCINE : For dog or cat bites, ask: Do you know if the pet is vaccinated against rabies?  (e.g., yes, no, overdue for rabies shot,  unknown)     Unknown, probably not as it was under 4 months old  Protocols used: Animal Bite-A-AH

## 2023-03-10 NOTE — Discharge Instructions (Addendum)
 You have been treated for dog bite of the left hand.  Your x-ray imaging was negative.  Your wound was cleaned and dressed in the emergency department.  It needs to remain open without closure by sutures to allow healing by secondary intention and allow for drainage as closure of the wound would increase the chance that it would get infected with complications.  You have been treated with the rabies immunoglobulin and vaccine as the dog was of unknown vaccination status and is not in custody under observation.    Return for rabies vaccination on days 3, 7 and 14.

## 2023-03-10 NOTE — ED Provider Notes (Addendum)
 [place] EMERGENC[facility] DEPARTMENT AT [facility] Provider Note   CSN: [id number] Arrival date & time: 03/10/23  1305     History  Chief Complaint  Patient presents with   Animal Bite    Olivia [name] is a 63 y.o. female.   Animal Bite    63 year old female with medical history significant for osteoporosis, HLD, breast cancer status postchemotherapy and radiation therapy, status post lumpectomy who presents to the emergency department after a dog bite.  The patient states that she was walking when an unknown dog bit her in the left hand.  She sustained a 3 cm laceration to the dorsum of the left hand that is now hemostatic at the dog owners walked off after apologizing.  She is unclear as to the vaccination status of the dog and it is not under any observation that she knows of.  Her tetanus is up-to-date.  She denies any other injuries or complaints.  She washed out the wound and it has been swelling causing a little bit more pain.  The bite occurred last night around 5:30 PM.  Home Medications Prior to Admission medications   Medication Sig Start Date End Date Taking? Authorizing Provider  amoxicillin -clavulanate (AUGMENTIN ) 875-125 MG tablet Take 1 tablet by mouth every 12 (twelve) hours. 03/10/23  Yes Jerrol Agent, MD  atorvastatin  (LIPITOR) 20 MG tablet Take 1 tablet (20 mg total) by mouth daily. 05/26/22   Kuneff, Renee A, DO  Calcium  500 MG CHEW Chew by mouth.    [provider]  Collagenase POWD by Does not apply route.    [provider]  diclofenac  (VOLTAREN ) 75 MG EC tablet Take 1 tablet (75 mg total) by mouth daily. 05/26/22   Kuneff, Renee A, DO  ELDERBERRY PO Take by mouth.    [provider]  predniSONE  (DELTASONE ) 20 MG tablet 2 tabs po every day x 5d 02/02/23   McGowen, Aleene DEL, MD  psyllium (METAMUCIL) 58.6 % powder Take 1 packet by mouth daily.    [provider]  tamoxifen  (NOLVADEX ) 20 MG tablet TAKE 1 TABLET BY MOUTH  EVERY DAY 12/01/22   Lanny Callander, MD  Vitamin D , Cholecalciferol, 25 MCG (1000 UT) CAPS Take 1 capsule by mouth daily.    [provider]      Allergies    Patient has no known allergies.    Review of Systems   Review of Systems  All other systems reviewed and are negative.   Physical Exam Updated Vital Signs BP (!) 157/101   Pulse 72   Temp 97.7 F (36.5 C)   Resp 18   SpO2 100%  Physical Exam Vitals and nursing note reviewed.  Constitutional:      General: She is not in acute distress. HENT:     Head: Normocephalic and atraumatic.  Eyes:     Conjunctiva/sclera: Conjunctivae normal.     Pupils: Pupils are equal, round, and reactive to light.  Cardiovascular:     Rate and Rhythm: Normal rate and regular rhythm.  Pulmonary:     Effort: Pulmonary effort is normal. No respiratory distress.  Abdominal:     General: There is no distension.     Tenderness: There is no guarding.  Musculoskeletal:        General: No deformity or signs of injury.     Cervical back: Neck supple.     Comments: 3 cm laceration to the dorsum of the left hand, hemostatic, not particularly gaping.  Mild  surrounding soft tissue swelling, no erythema or warmth  Skin:    Findings: No lesion or rash.  Neurological:     General: No focal deficit present.     Mental Status: She is alert. Mental status is at baseline.     ED Results / Procedures / Treatments   Labs (all labs ordered are listed, but only abnormal results are displayed) Labs Reviewed - No data to display  EKG None  Radiology DG Hand 2 View Left Result Date: 03/10/2023 CLINICAL DATA:  Posterior left hand laceration today after a dog bite. EXAM: LEFT HAND - 2 VIEW COMPARISON:  None Available. FINDINGS: There is diffuse decreased bone mineralization. Mild thumb carpometacarpal joint space narrowing, subchondral sclerosis, and peripheral osteophytosis. No acute fracture or dislocation. No radiopaque foreign body. IMPRESSION: Mild  thumb carpometacarpal osteoarthritis. No acute fracture. Electronically Signed   By: Tanda Lyons M.D.   On: 03/10/2023 16:25    Procedures .Laceration Repair  Date/Time: 03/10/2023 4:41 PM  Performed by: Jerrol Agent, MD Authorized by: Jerrol Agent, MD   Consent:    Consent obtained:  Verbal   Consent given by:  Patient   Risks discussed:  Infection, poor cosmetic result and poor wound healing Universal protocol:    Patient identity confirmed:  Verbally with patient Anesthesia:    Anesthesia method:  None Laceration details:    Length (cm):  3 Treatment:    Area cleansed with:  Soap and water   Amount of cleaning:  Standard Skin repair:    Repair method:  Steri-Strips   Number of Steri-Strips:  1 Approximation:    Approximation:  Loose Repair type:    Repair type:  Simple Post-procedure details:    Dressing:  Bulky dressing   Procedure completion:  Tolerated     Medications Ordered in ED Medications  rabies immune globulin  (HYPERRAB/KEDRAB ) injection 1,275 Units (1,275 Units Intramuscular Given 03/10/23 1619)  rabies vaccine  (RABAVERT ) injection 1 mL (1 mL Intramuscular Given 03/10/23 1616)  amoxicillin -clavulanate (AUGMENTIN ) 875-125 MG per tablet 1 tablet (1 tablet Oral Given 03/10/23 1627)    ED Course/ Medical Decision Making/ A&P                                 Medical Decision Making Amount and/or Complexity of Data Reviewed Radiology: ordered.  Risk Prescription drug management.    63 year old female with medical history significant for osteoporosis, HLD, breast cancer status postchemotherapy and radiation therapy, status post lumpectomy who presents to the emergency department after a dog bite.  The patient states that she was walking when an unknown dog bit her in the left hand.  She sustained a 3 cm laceration to the dorsum of the left hand that is now hemostatic at the dog owners walked off after apologizing.  She is unclear as to the vaccination status  of the dog and it is not under any observation that she knows of.  Her tetanus is up-to-date.  She denies any other injuries or complaints.  She washed out the wound and it has been swelling causing a little bit more pain.  The bite occurred last night around 5:30 PM.  On arrival, the patient was vitally stable, presenting with a laceration after being bit by a unknown dog.  Discussed need for rabies immunoglobulin and vaccination, patient's tetanus is up-to-date.  X-ray imaging was performed which was negative for acute fracture or foreign body.  Patient's wound  was cleaned out bedside by nursing staff with warm soapy water.  It was subsequently dressed.  A single Steri-Strip was placed after discussing with the patient the need for healing by secondary intention, wound only loosely brought together to allow for continued drainage.  Will discharge the patient on Augmentin , infectious return precautions were provided as well as wound care instructions.  Final Clinical Impression(s) / ED Diagnoses Final diagnoses:  Dog bite of left hand, initial encounter    Rx / DC Orders ED Discharge Orders          Ordered    amoxicillin -clavulanate (AUGMENTIN ) 875-125 MG tablet  Every 12 hours        03/10/23 1554              Jerrol Agent, MD 03/10/23 1637    Jerrol Agent, MD 03/10/23 1642

## 2023-03-10 NOTE — ED Triage Notes (Signed)
Dog bite to left hand Last night around 5:30pm Unknown dog Last tdap was 05/2021

## 2023-03-10 NOTE — Telephone Encounter (Signed)
FYI pt was bit by dog and sent to the ED

## 2023-03-13 ENCOUNTER — Ambulatory Visit
Admission: EM | Admit: 2023-03-13 | Discharge: 2023-03-13 | Disposition: A | Discharge status: Home / Self Care | Payer: BC Managed Care – PPO | Attending: Family Medicine | Admitting: Family Medicine

## 2023-03-13 ENCOUNTER — Other Ambulatory Visit: Payer: Self-pay

## 2023-03-13 ENCOUNTER — Encounter: Payer: Self-pay | Admitting: Emergency Medicine

## 2023-03-13 DIAGNOSIS — Z203 Contact with and (suspected) exposure to rabies: Secondary | ICD-10-CM | POA: Diagnosis not present

## 2023-03-13 DIAGNOSIS — Z23 Encounter for immunization: Secondary | ICD-10-CM

## 2023-03-13 MED ORDER — RABIES VACCINE, PCEC IM SUSR
1.0000 mL | Freq: Once | INTRAMUSCULAR | Status: AC
Start: 2023-03-13 — End: 2023-03-13
  Administered 2023-03-13: 1 mL via INTRAMUSCULAR

## 2023-03-13 NOTE — ED Triage Notes (Signed)
 Patient here for her 2nd rabies injection.  No problem with 1st series done in the ED on 03/10/2023.

## 2023-03-17 ENCOUNTER — Ambulatory Visit
Admission: RE | Admit: 2023-03-17 | Discharge: 2023-03-17 | Disposition: A | Discharge status: Home / Self Care | Payer: BC Managed Care – PPO | Source: Ambulatory Visit | Attending: Family Medicine | Admitting: Family Medicine

## 2023-03-17 ENCOUNTER — Other Ambulatory Visit: Payer: Self-pay

## 2023-03-17 DIAGNOSIS — Z203 Contact with and (suspected) exposure to rabies: Secondary | ICD-10-CM

## 2023-03-17 DIAGNOSIS — Z23 Encounter for immunization: Secondary | ICD-10-CM

## 2023-03-17 DIAGNOSIS — W540XXA Bitten by dog, initial encounter: Secondary | ICD-10-CM

## 2023-03-17 MED ORDER — RABIES VACCINE, PCEC IM SUSR
1.0000 mL | Freq: Once | INTRAMUSCULAR | Status: AC
  Administered 2023-03-17: 1 mL via INTRAMUSCULAR

## 2023-03-17 NOTE — ED Triage Notes (Signed)
Pt here for 3rd RABIES SHOT

## 2023-03-24 ENCOUNTER — Other Ambulatory Visit: Payer: Self-pay

## 2023-03-24 ENCOUNTER — Ambulatory Visit
Admission: RE | Admit: 2023-03-24 | Discharge: 2023-03-24 | Disposition: A | Discharge status: Home / Self Care | Payer: BC Managed Care – PPO | Source: Ambulatory Visit | Attending: Family Medicine | Admitting: Family Medicine

## 2023-03-24 DIAGNOSIS — Z203 Contact with and (suspected) exposure to rabies: Secondary | ICD-10-CM

## 2023-03-24 DIAGNOSIS — Z23 Encounter for immunization: Secondary | ICD-10-CM | POA: Diagnosis not present

## 2023-03-24 MED ORDER — RABIES VACCINE, PCEC IM SUSR
1.0000 mL | Freq: Once | INTRAMUSCULAR | Status: AC
  Administered 2023-03-24: 1 mL via INTRAMUSCULAR

## 2023-03-24 NOTE — ED Triage Notes (Signed)
[facility] presents to [facility] with need for 4th rabies shot.

## 2023-03-31 ENCOUNTER — Ambulatory Visit: Payer: BC Managed Care – PPO | Admitting: Hematology

## 2023-03-31 ENCOUNTER — Other Ambulatory Visit: Payer: BC Managed Care – PPO

## 2023-03-31 ENCOUNTER — Ambulatory Visit: Payer: BC Managed Care – PPO

## 2023-04-06 ENCOUNTER — Other Ambulatory Visit: Payer: Self-pay

## 2023-04-06 DIAGNOSIS — C50211 Malignant neoplasm of upper-inner quadrant of right female breast: Secondary | ICD-10-CM

## 2023-04-06 NOTE — Assessment & Plan Note (Signed)
invasive ductal carcinoma, pT2N0, M0), Stage 1B, ER/PR: positive, HER2 negative, Grade 3, Oncotype RS 21, recurrence in 07/2022 -diagnosed 10/2016, s/p bilateral mastectomy and reconstruction. Based on oncotype RS she did not need adjuvant chemotherapy. And given node-negative mastectomy she did not require adjuvant radiation -started adjuvant letrozole 12/2016. She was changed to exemestane 11/2019 due to arthralgia.  Planned for a total of 7 years but stopped 12/2021 due to worsening bone density.  -Recent US/bx showed recurrent vs new R breast IDC, G2, ER/PR strongly positive, HER2 negative, with Ki67 20%.  Staging CT and bone scan showed no definitive evidence of metastasis. -She underwent right lumpectomy on August 18, 2022, no residual malignant cell was seen on surgical path.  Her initial biopsy showed 1.5 mm invasive cancer, the cancer was removed completely by biopsy. -Given the small size of invasive cancer, no benefit of adjuvant chemotherapy. -She previously received a total of 5 years of AI.  She stopped in November 2023 due to osteoprosis -I recommended 5 years of tamoxifen, she started in 08/2022 -She completed adjuvant RT on 11/26/2022

## 2023-04-07 ENCOUNTER — Other Ambulatory Visit: Payer: Self-pay

## 2023-04-07 ENCOUNTER — Ambulatory Visit
Admission: RE | Admit: 2023-04-07 | Discharge: 2023-04-07 | Disposition: A | Discharge status: Home / Self Care | Source: Ambulatory Visit | Attending: Family Medicine | Admitting: Family Medicine

## 2023-04-07 ENCOUNTER — Inpatient Hospital Stay: Payer: BC Managed Care – PPO

## 2023-04-07 ENCOUNTER — Inpatient Hospital Stay: Payer: BC Managed Care – PPO | Attending: Nurse Practitioner | Admitting: Hematology

## 2023-04-07 VITALS — BP 140/92 | HR 73 | Temp 97.2°F | Resp 22 | Ht 64.0 in | Wt 145.4 lb

## 2023-04-07 DIAGNOSIS — Z23 Encounter for immunization: Secondary | ICD-10-CM

## 2023-04-07 DIAGNOSIS — Z90710 Acquired absence of both cervix and uterus: Secondary | ICD-10-CM | POA: Diagnosis not present

## 2023-04-07 DIAGNOSIS — M810 Age-related osteoporosis without current pathological fracture: Secondary | ICD-10-CM | POA: Diagnosis not present

## 2023-04-07 DIAGNOSIS — Z9221 Personal history of antineoplastic chemotherapy: Secondary | ICD-10-CM | POA: Diagnosis not present

## 2023-04-07 DIAGNOSIS — M8580 Other specified disorders of bone density and structure, unspecified site: Secondary | ICD-10-CM | POA: Insufficient documentation

## 2023-04-07 DIAGNOSIS — S61452A Open bite of left hand, initial encounter: Secondary | ICD-10-CM | POA: Diagnosis not present

## 2023-04-07 DIAGNOSIS — Z170 Estrogen receptor positive status [ER+]: Secondary | ICD-10-CM

## 2023-04-07 DIAGNOSIS — W540XXA Bitten by dog, initial encounter: Secondary | ICD-10-CM

## 2023-04-07 DIAGNOSIS — E2839 Other primary ovarian failure: Secondary | ICD-10-CM

## 2023-04-07 DIAGNOSIS — C50211 Malignant neoplasm of upper-inner quadrant of right female breast: Secondary | ICD-10-CM | POA: Diagnosis not present

## 2023-04-07 DIAGNOSIS — Z9013 Acquired absence of bilateral breasts and nipples: Secondary | ICD-10-CM | POA: Diagnosis not present

## 2023-04-07 LAB — CMP (CANCER CENTER ONLY)
ALT: 19 U/L (ref 0–44)
AST: 24 U/L (ref 15–41)
Albumin: 4.6 g/dL (ref 3.5–5.0)
Alkaline Phosphatase: 40 U/L (ref 38–126)
Anion gap: 6 (ref 5–15)
BUN: 16 mg/dL (ref 8–23)
CO2: 30 mmol/L (ref 22–32)
Calcium: 10.1 mg/dL (ref 8.9–10.3)
Chloride: 106 mmol/L (ref 98–111)
Creatinine: 0.99 mg/dL (ref 0.44–1.00)
GFR, Estimated: >60 mL/min (ref >60)
Glucose, Bld: 121 mg/dL — ABNORMAL HIGH (ref 70–99)
Potassium: 5.1 mmol/L (ref 3.5–5.1)
Sodium: 142 mmol/L (ref 135–145)
Total Bilirubin: 0.6 mg/dL (ref 0.0–1.2)
Total Protein: 6.8 g/dL (ref 6.5–8.1)

## 2023-04-07 LAB — CBC WITH DIFFERENTIAL (CANCER CENTER ONLY)
Abs Immature Granulocytes: 0.01 10*3/uL (ref 0.00–0.07)
Basophils Absolute: 0 10*3/uL (ref 0.0–0.1)
Basophils Relative: 0 %
Eosinophils Absolute: 0 10*3/uL (ref 0.0–0.5)
Eosinophils Relative: 1 %
HCT: 42.5 % (ref 36.0–46.0)
Hemoglobin: 14.1 g/dL (ref 12.0–15.0)
Immature Granulocytes: 0 %
Lymphocytes Relative: 28 %
Lymphs Abs: 1.5 10*3/uL (ref 0.7–4.0)
MCH: 29.9 pg (ref 26.0–34.0)
MCHC: 33.2 g/dL (ref 30.0–36.0)
MCV: 90.2 fL (ref 80.0–100.0)
Monocytes Absolute: 0.3 10*3/uL (ref 0.1–1.0)
Monocytes Relative: 6 %
Neutro Abs: 3.5 10*3/uL (ref 1.7–7.7)
Neutrophils Relative %: 65 %
Platelet Count: 240 10*3/uL (ref 150–400)
RBC: 4.71 MIL/uL (ref 3.87–5.11)
RDW: 13.5 % (ref 11.5–15.5)
WBC Count: 5.4 10*3/uL (ref 4.0–10.5)
nRBC: 0 % (ref 0.0–0.2)

## 2023-04-07 MED ORDER — RABIES VACCINE, PCEC IM SUSR
1.0000 mL | Freq: Once | INTRAMUSCULAR | Status: AC
  Administered 2023-04-07: 1 mL via INTRAMUSCULAR

## 2023-04-07 NOTE — Progress Notes (Signed)
 [facility]   [phone] [phone]   Clinic Follow up Note   Patient Care Team: Natalia Leatherwood, DO as PCP - General (Family Medicine) Malachy Mood, MD as Consulting Physician (Hematology) Griselda Miner, MD as Consulting Physician (General Surgery) Antony Blackbird, MD as Consulting Physician (Radiation Oncology) Mardella Layman, MD as Consulting Physician (Gastroenterology) Richarda Overlie, MD as Consulting Physician (Obstetrics and Gynecology) Axel Filler, Larna Daughters, NP as Nurse Practitioner (Hematology and Oncology) Reather Littler, MD (Inactive) as Consulting Physician (Endocrinology) Pollyann Samples, NP as Nurse Practitioner (Nurse Practitioner)  Date of Service:  04/07/2023  CHIEF COMPLAINT: f/u of breast cancer  CURRENT THERAPY:  Adjuvant tamoxifen  Oncology History   Malignant neoplasm of upper-inner quadrant of right breast in female, estrogen receptor positive (HCC) invasive ductal carcinoma, pT2N0, M0), Stage 1B, ER/PR: positive, HER2 negative, Grade 3, Oncotype RS 21, recurrence in 07/2022 -diagnosed 10/2016, s/p bilateral mastectomy and reconstruction. Based on oncotype RS she did not need adjuvant chemotherapy. And given node-negative mastectomy she did not require adjuvant radiation -started adjuvant letrozole 12/2016. She was changed to exemestane 11/2019 due to arthralgia.  Planned for a total of 7 years but stopped 12/2021 due to worsening bone density.  -Recent US/bx showed recurrent vs new R breast IDC, G2, ER/PR strongly positive, HER2 negative, with Ki67 20%.  Staging CT and bone scan showed no definitive evidence of metastasis. -She underwent right lumpectomy on August 18, 2022, no residual malignant cell was seen on surgical path.  Her initial biopsy showed 1.5 mm invasive cancer, the cancer was removed completely by biopsy. -Given the small size of invasive cancer, no benefit of adjuvant chemotherapy. -She previously received a  total of 5 years of AI.  She stopped in November 2023 due to osteoprosis -I recommended 5 years of tamoxifen, she started in 08/2022 -She completed adjuvant RT on 11/26/2022    Assessment and Plan    Breast Cancer Post double mastectomy (2018) and resection of local recurrence(July 2024), currently on tamoxifen since November 2024 with no side effects such as hot flashes or joint pain. She remains active with improved energy levels. Recent labs, including blood counts, kidney, and liver function, are normal. No palpable nodules or implant issues on examination. Doing well post-surgery and radiation therapy. Discussed circulating tumor DNA blood test for recurrence monitoring, which can detect tumor DNA in the blood. A negative result indicates a lower recurrence risk, while a positive result may require a whole body scan, with a 50% chance of detecting metastatic disease. The test is sensitive and accurate, though false positives and negatives are possible. - Continue tamoxifen therapy - Schedule follow-up every six months - Perform circulating tumor DNA blood test in three months - Perform whole body scan if circulating tumor DNA test is positive  Osteopenia Diagnosed with osteopenia based on bone density scan in 2023. Has received three Zometa infusions, with one more scheduled. - Schedule fourth Zometa infusion in June 2025 - Repeat bone density scan in October 2025      Plan -She is clinically doing very well, no concern for recurrence -Continue tamoxifen, she is tolerating well without noticeable side effects -Lab and Zometa in 3 months, will check guardianReveal for early recurrence detection -Follow-up in 6 months   SUMMARY OF ONCOLOGIC HISTORY: Oncology History Overview Note  Cancer Staging Malignant neoplasm of upper-inner quadrant of right breast in female, estrogen receptor positive (HCC) Staging form: Breast, AJCC 8th Edition - Clinical stage from 10/09/2016:  Stage IA (cT1c,  cN0, cM0, G3, ER: Positive, PR: Positive, HER2: Negative) - Signed by Malachy Mood, MD on 10/14/2016 - Pathologic stage from 11/17/2016: Stage IB (pT2, pN0, cM0, G3, ER: Positive, PR: Positive, HER2: Negative, Oncotype DX score: 21) - Signed by Malachy Mood, MD on 12/11/2016     Malignant neoplasm of upper-inner quadrant of right breast in female, estrogen receptor positive (HCC)  10/08/2016 Mammogram   Diagnostic mammogram and ultrasound showed an irregular mass in the right breast at 1 o'clock, 5 cm from the nipple measuring 1.6 x 1.4x 1.6 cm, axilla was negative for adenopathy.   10/09/2016 Receptors her2   Estrogen Receptor: 80%, POSITIVE, STRONG STAINING INTENSITY Progesterone Receptor: 30%, POSITIVE, STRONG STAINING INTENSITY Proliferation Marker Ki67: 35% HER2 NEGATIVE    10/09/2016 Initial Diagnosis   Malignant neoplasm of upper-inner quadrant of right breast in female, estrogen receptor positive (HCC)   10/09/2016 Initial Biopsy   Diagnosis Breast, right, needle core biopsy, upper inner quadrant, 1:00 o'clock - INVASIVE DUCTAL CARCINOMA, G3   10/2016 Genetic Testing   Genetic testing was negative for The genes analyzed were the 23 genes on [facility]'s Breast/GYN panel (ATM, BARD1, BRCA1, BRCA2, BRIP1, CDH1, CHEK2, DICER1, EPCAM, MLH1,  MSH2, MSH6, NBN, NF1, PALB2, PMS2, PTEN, RAD50, RAD51C, RAD51D,SMARCA4, STK11, and TP53).     11/17/2016 Surgery   RIGHT MASTECTOMY AND LEFT PROPHYLATIC MASTECTOMY by Dr. Carolynne Edouard  IMEDITATE BREAST RECONSTRUCTION WITH PLACEMENT OF TISSUE EXPANDER AND FLEX HD (ACELLULAR HYDRATED DERMIS) by Dr. Ulice Bold     11/17/2016 Pathology Results   Diagnosis 11/17/16 1. Breast, simple mastectomy, Left - BENIGN BREAST TISSUE WITH ECTATIC DUCTS. - ONE OF ONE LYMPH NODE NEGATIVE FOR CARCINOMA (0/1). 2. Breast, simple mastectomy, Right - INVASIVE DUCTAL CARCINOMA, GRADE 3, SPANNING 2.4 CM. - HIGH GRADE DUCTAL CARCINOMA IN SITU. - INVASIVE CARCINOMA IS <0.1 CM OF THE  POSTERIOR MARGIN (BROADLY) AND ANTERIOR MARGIN (FOCALLY). - IN SITU CARCINOMA IS >0.2 CM OF BOTH MARGINS. - PRIOR LUMPECTOMY SITE, NEGATIVE FOR CARCINOMA. - SEE ONCOLOGY TABLE. 3. Breast, excision, Superior flap deep margin - BENIGN FIBROADIPOSE TISSUE.   11/17/2016 Miscellaneous   Oncotype on 11/17/16 Rcurence Risk Score of 21 10-Year risk of distant reucrrence with Tamoxifen alone is 13%    12/18/2016 - 12/30/2021 Anti-estrogen oral therapy   Adjuvant Letrozole 2.5 mg daily; began 12/18/16. Due to arthritis I switched her to Exemestane 25mg  daily in 11/2019.    12/24/2016 Imaging   BONE SCAN IMPRESSION: No evidence of osseous metastatic disease.   12/24/2016 Imaging   CT CAP IMPRESSION: 1. Status post bilateral modified radical mastectomy and breast reconstruction, as above. No findings to suggest metastatic disease in the chest, abdomen or pelvis. 2. Multiple heterogeneous appearing thyroid nodules, as discussed above. These are nonspecific but further evaluation with nonemergent thyroid ultrasound is recommended in the near future to determine any potential need for further evaluation with fine-needle aspiration. 3. Incidental findings, as above.   02/11/2017 Surgery   REMOVAL OF BILATERAL TISSUE EXPANDERS WITH PLACEMENT OF BILATERAL SILICONE BREAST IMPLANTS by Dr. Ulice Bold    06/19/2022 Imaging   R AX [place]:  Targeted ultrasound is performed, showing a 5 x 2 x 2 mm irregular hypoechoic mass right breast 1 o'clock position 3 cm from nipple at the site of palpable concern. No right axillary adenopathy.   IMPRESSION: Indeterminate palpable mass right breast 1 o'clock position.   07/01/2022 Pathology Results   Recurrence/new   Breast, right, needle core biopsy, 1 o'clock, 3cmfn, hydromark  clip INVASIVE DUCTAL CARCINOMA, RECURRENT OVERALL GRADE: 2  PROGNOSTIC INDICATORS The tumor cells are EQUIVOCAL for Her2 (2+).  Estrogen Receptor: 100%, POSITIVE, STRONG STAINING  INTENSITY Progesterone Receptor: 100%, POSITIVE, STRONG STAINING INTENSITY Proliferation Marker Ki67: 20%  1A. Breast, righnt,needle core biopsy, 1 o'clock, 3 cmfn FLUORESCENCE IN-SITU HYBRIDIZATION Results: GROUP 5: HER2 **NEGATIVE**     08/18/2022 Definitive Surgery   A. BREAST, RIGHT, LUMPECTOMY:  - Skin with underlying benign fibroadipose tissue  - No evidence of malignancy   B. BREAST, RIGHT LATERAL MARGIN, EXCISION:  - Benign skeletal muscle and fibroadipose tissue  - No evidence of malignancy    10/02/2022 Pathology Results   Breast, right, needle core biopsy, upper inner, 1 o'clock, 3cmfn BENIGN BREAST TISSUE WITH FAT NECROSIS, GIANT CELL REACTIONS AND CYST WALL. NEGATIVE FOR ATYPIA OR MALIGNANCY.   10/13/2022 - 11/26/2022 Radiation Therapy   Adjuvant R breast radiation per Dr. Basilio Cairo   11/12/2022 Genetic Testing   Negative [facility] CancerNext-Expanded +RNAinsight Panel.  Report date is 11/12/2022.   The CancerNext-Expanded gene panel offered by [facility] and includes sequencing, rearrangement, and RNA analysis for the following 71 genes:  AIP, ALK, APC, ATM, BAP1, BARD1, BMPR1A, BRCA1, BRCA2, BRIP1, CDC73, CDH1, CDK4, CDKN1B, CDKN2A, CHEK2, DICER1, FH, FLCN, KIF1B, LZTR1,MAX, MEN1, MET, MLH1, MSH2, MSH6, MUTYH, NF1, NF2, NTHL1, PALB2, PHOX2B, PMS2, POT1, PRKAR1A, PTCH1, PTEN, RAD51C,RAD51D, RB1, RET, SDHA, SDHAF2, SDHB, SDHC, SDHD, SMAD4, SMARCA4, SMARCB1, SMARCE1, STK11, SUFU, TMEM127, TP53,TSC1, TSC2 and VHL (sequencing and deletion/duplication); AXIN2, CTNNA1, EGFR, EGLN1, HOXB13, KIT, MITF, MSH3, PDGFRA, POLD1 and POLE (sequencing only); EPCAM and GREM1 (deletion/duplication only).    11/2022 -  Anti-estrogen oral therapy   Adjuvant Tamoxifen, starting 11/2022, goal 5 years   12/16/2022 Survivorship   SCP delivered by Santiago Glad, NP      Discussed the use of AI scribe software for clinical note transcription with the patient, who gave verbal consent to  proceed.  History of Present Illness   The patient, a 63 year old female with a history of breast cancer, presents for a routine follow-up. She has been on tamoxifen since November of the previous year and reports no issues with the medication. She denies experiencing hot flashes or joint pain. She has been sleeping well and has been able to maintain an active lifestyle, including regular gym visits and travel. She works approximately ten hours a week.  The patient underwent a double mastectomy in 2018 and a lumpectomy in July of the previous year. She has been regularly checking herself for any abnormalities and reports no pain or issues with her shoulders. She has not noticed any nodules or other concerning changes.  The patient also has a history of osteopenia, with her last bone density scan in 2023 showing low results. She is due for another scan in October of the current year. She has also received three infusions of Zometa, with a fourth scheduled for June.         All other systems were reviewed with the patient and are negative.  MEDICAL HISTORY:  Past Medical History:  Diagnosis Date   Adjustment insomnia 02/23/2019   Breast cancer (HCC) 2018   Lumpectomy, chemotherapy and radiation with recurrence 2018. Now on Femara   Cervical radiculopathy 07/05/2019   COVID-19 02/2019   Her husband also passed away at this time from COVID-19.   Family history of breast cancer    Genetic testing 10/30/2016   Breast/GYN panel (23 genes) @ [facility] - No pathogenic mutations detected  GERD (gastroesophageal reflux disease)    OTC medication   Grief reaction 02/23/2019   Heart murmur    as a child - Echo in early 20s "ok"   History of echocardiogram 05/2015   Echo 4/17: EF 55-60%, GLS -19.6%, normal wall motion   History of exercise stress test 05/2015   ETT 4/17 - No ST changes   HLD (hyperlipidemia)    Hypothyroidism    Multiple thyroid nodules 2009   Multiple nodules: Normal  ultrasound reported at diagnosis   Osteoporosis    Personal history of chemotherapy    Personal history of radiation therapy    UNEQUAL LEG LENGTH, ACQUIRED 11/17/2006   Qualifier: Diagnosis of  By: Darrick Penna MD, KARL      SURGICAL HISTORY: Past Surgical History:  Procedure Laterality Date   ABDOMINAL HYSTERECTOMY     BREAST BIOPSY Right 07/01/2022   [place] RT BREAST BX W LOC DEV 1ST LESION IMG BX SPEC US GUIDE 07/01/2022 [facility]   BREAST BIOPSY  08/15/2022   [place] RT RADIOACTIVE SEED LOC 08/15/2022 [facility]   BREAST BIOPSY Right 10/02/2022   [place] RT BREAST BX W LOC DEV 1ST LESION IMG BX SPEC US GUIDE 10/02/2022 [facility]   BREAST LUMPECTOMY Right 2003   BREAST LUMPECTOMY WITH RADIOACTIVE SEED LOCALIZATION Right 08/18/2022   Procedure: RIGHT BREAST LUMPECTOMY WITH RADIOACTIVE SEED LOCALIZATION;  Surgeon: Griselda Miner, MD;  Location: [facility];  Service: General;  Laterality: Right;   BREAST RECONSTRUCTION WITH PLACEMENT OF TISSUE EXPANDER AND FLEX HD (ACELLULAR HYDRATED DERMIS) Bilateral 11/17/2016   Procedure: IMEDITATE BREAST RECONSTRUCTION WITH PLACEMENT OF TISSUE EXPANDER AND FLEX HD (ACELLULAR HYDRATED DERMIS);  Surgeon: Peggye Form, DO;  Location: [facility];  Service: Plastics;  Laterality: Bilateral;   BREAST SURGERY  2003 /right   lumpectomy; chemotherapy and radiation   LIPOSUCTION WITH LIPOFILLING Bilateral 02/23/2020   Procedure: Fat filling of breasts for symmetry from abdomen;  Surgeon: Peggye Form, DO;  Location: [facility];  Service: Plastics;  Laterality: Bilateral;  90 min, please   LIPOSUCTION WITH LIPOFILLING Bilateral 05/16/2021   Procedure: LIPOSUCTION WITH LIPOFILLING;  Surgeon: Peggye Form, DO;  Location: [facility];  Service: Plastics;  Laterality: Bilateral;  1.5 hours   REMOVAL OF BILATERAL TISSUE EXPANDERS WITH PLACEMENT OF BILATERAL BREAST IMPLANTS Bilateral 02/11/2017   Procedure:  REMOVAL OF BILATERAL TISSUE EXPANDERS WITH PLACEMENT OF BILATERAL SILICONE BREAST IMPLANTS;  Surgeon: Peggye Form, DO;  Location: [facility];  Service: Plastics;  Laterality: Bilateral;   TOTAL ABDOMINAL HYSTERECTOMY W/ BILATERAL SALPINGOOPHORECTOMY     for treatment of breast treatment   TOTAL MASTECTOMY Bilateral 11/17/2016   Procedure: RIGHT MASTECTOMY AND LEFT PROPHYLATIC MASTECTOMY;  Surgeon: Griselda Miner, MD;  Location: [facility];  Service: General;  Laterality: Bilateral;    I have reviewed the social history and family history with the patient and they are unchanged from previous note.  ALLERGIES:  has no known allergies.  MEDICATIONS:  Current Outpatient Medications  Medication Sig Dispense Refill   atorvastatin (LIPITOR) 20 MG tablet Take 1 tablet (20 mg total) by mouth daily. 90 tablet 3   Calcium 500 MG CHEW Chew by mouth.     Collagenase POWD by Does not apply route.     diclofenac (VOLTAREN) 75 MG EC tablet Take 1 tablet (75 mg total) by mouth daily. 90 tablet 3   ELDERBERRY PO Take by mouth.  psyllium (METAMUCIL) 58.6 % powder Take 1 packet by mouth daily.     tamoxifen (NOLVADEX) 20 MG tablet TAKE 1 TABLET BY MOUTH EVERY DAY 90 tablet 1   Vitamin D, Cholecalciferol, 25 MCG (1000 UT) CAPS Take 1 capsule by mouth daily.     No current facility-administered medications for this visit.    PHYSICAL EXAMINATION: ECOG PERFORMANCE STATUS: 0 - Asymptomatic  Vitals:   04/07/23 1105  BP: (!) 140/92  Pulse: 73  Resp: (!) 22  Temp: (!) 97.2 F (36.2 C)  SpO2: 97%   Wt Readings from Last 3 Encounters:  04/07/23 145 lb 6.4 oz (66 kg)  03/09/23 142 lb 6.7 oz (64.6 kg)  02/02/23 142 lb 6.4 oz (64.6 kg)     GENERAL:alert, no distress and comfortable SKIN: skin color, texture, turgor are normal, no rashes or significant lesions EYES: normal, Conjunctiva are pink and non-injected, sclera clear NECK: supple, thyroid normal size, non-tender, without  nodularity LYMPH:  no palpable lymphadenopathy in the cervical, axillary  LUNGS: clear to auscultation and percussion with normal breathing effort HEART: regular rate & rhythm and no murmurs and no lower extremity edema ABDOMEN:abdomen soft, non-tender and normal bowel sounds Musculoskeletal:no cyanosis of digits and no clubbing  NEURO: alert & oriented x 3 with fluent speech, no focal motor/sensory deficits Breasts: Status post bilateral mastectomy with implant reconstruction, no palpable mass or adenopathy.  LABORATORY DATA:  I have reviewed the data as listed    Latest Ref Rng & Units 04/07/2023   10:38 AM 01/13/2023    2:18 PM 07/10/2022   11:16 AM  CBC  WBC 4.0 - 10.5 K/uL 5.4  6.2  6.6   Hemoglobin 12.0 - 15.0 g/dL 40.9  81.1  91.4   Hematocrit 36.0 - 46.0 % 42.5  42.9  44.8   Platelets 150 - 400 K/uL 240  246  279         Latest Ref Rng & Units 04/07/2023   10:38 AM 01/13/2023    2:18 PM 07/10/2022   11:16 AM  CMP  Glucose 70 - 99 mg/dL 782  956  97   BUN 8 - 23 mg/dL 16  22  26    Creatinine 0.44 - 1.00 mg/dL 2.13  0.86  5.78   Sodium 135 - 145 mmol/L 142  140  141   Potassium 3.5 - 5.1 mmol/L 5.1  3.7  3.9   Chloride 98 - 111 mmol/L 106  104  103   CO2 22 - 32 mmol/L 30  29  30    Calcium 8.9 - 10.3 mg/dL 46.9  9.9  62.9   Total Protein 6.5 - 8.1 g/dL 6.8  7.1  7.5   Total Bilirubin 0.0 - 1.2 mg/dL 0.6  0.6  0.7   Alkaline Phos 38 - 126 U/L 40  57  77   AST 15 - 41 U/L 24  19  36   ALT 0 - 44 U/L 19  15  36       RADIOGRAPHIC STUDIES: I have personally reviewed the radiological images as listed and agreed with the findings in the report. No results found.    Orders Placed This Encounter  Procedures   DG Bone Density    Standing Status:   Future    Expected Date:   12/03/2023    Expiration Date:   04/06/2024    Reason for Exam (SYMPTOM  OR DIAGNOSIS REQUIRED):   SCREENING    Preferred imaging location?:  [facility]   All questions were answered. The  patient knows to call the clinic with any problems, questions or concerns. No barriers to learning was detected. The total time spent in the appointment was 25 minutes.     Malachy Mood, MD 04/07/2023

## 2023-04-07 NOTE — ED Triage Notes (Signed)
Pt here for last rabies shot 

## 2023-04-07 NOTE — Progress Notes (Signed)
 Per verbal order from Dr. Mosetta Putt for Guardant Reveal to be drawn in 3 months.  Order placed and paperwork given to Kim in [facility].

## 2023-04-28 ENCOUNTER — Ambulatory Visit (INDEPENDENT_AMBULATORY_CARE_PROVIDER_SITE_OTHER): Payer: Self-pay | Admitting: Plastic Surgery

## 2023-04-28 ENCOUNTER — Encounter: Payer: Self-pay | Admitting: Plastic Surgery

## 2023-04-28 DIAGNOSIS — Z719 Counseling, unspecified: Secondary | ICD-10-CM

## 2023-04-28 NOTE — Progress Notes (Signed)

## 2023-05-05 ENCOUNTER — Encounter: Payer: Self-pay | Admitting: Family Medicine

## 2023-05-05 ENCOUNTER — Ambulatory Visit: Admitting: Family Medicine

## 2023-05-05 VITALS — BP 134/84 | HR 81 | Temp 98.1°F | Wt 146.0 lb

## 2023-05-05 DIAGNOSIS — J0100 Acute maxillary sinusitis, unspecified: Secondary | ICD-10-CM

## 2023-05-05 MED ORDER — DOXYCYCLINE HYCLATE 100 MG PO TABS: 100.0000 mg | ORAL_TABLET | Freq: Two times a day (BID) | ORAL | 0 refills | Status: DC

## 2023-05-05 NOTE — Progress Notes (Signed)
 Olivia [name] , 04/23/1960, 63 y.o., female MRN: [id number] Patient Care Team    Relationship Specialty Notifications Start End  Natalia Leatherwood, DO PCP - General Family Medicine  12/26/14   Malachy Mood, MD Consulting Physician Hematology  10/15/16   Griselda Miner, MD Consulting Physician General Surgery  10/15/16   Antony Blackbird, MD Consulting Physician Radiation Oncology  10/15/16   Mardella Layman, MD Consulting Physician Gastroenterology  04/14/17   Richarda Overlie, MD Consulting Physician Obstetrics and Gynecology  04/14/17   Loa Socks, NP Nurse Practitioner Hematology and Oncology  07/08/17   Pollyann Samples, NP Nurse Practitioner Nurse Practitioner  12/16/22     Chief Complaint  Patient presents with   Nasal Congestion    About a week; nasal congestion, cough, watery eyes, HA. Pt has tried Tylenol.      Subjective: Olivia [name] is a 63 y.o. Pt presents for an OV with complaints of sinus symptoms of 7 days duration.  Associated symptoms include Dry cough, runny nose, watery eyes, congestion at night, earache, headache (max sinus) Pt has tried tylenol and vics to ease their symptoms.  Immunocompromised patient.      09/16/2022    9:00 AM 05/26/2022    8:58 AM 11/27/2021   11:16 AM 05/23/2021    9:02 AM 02/28/2021   11:28 AM  Depression screen PHQ 2/9  Decreased Interest 0 0 0 0 0  Down, Depressed, Hopeless 0 0 0 0 0  PHQ - 2 Score 0 0 0 0 0    No Known Allergies Social History   Social History Narrative   Married. Bobby.   Children: none. No pets.    Stay at home.    Wears her seatbelt. Smoke alarm in the home.    Feels safe in her relationships.    Exercise 5x week.    Past Medical History:  Diagnosis Date   Adjustment insomnia 02/23/2019   Breast cancer (HCC) 2018   Lumpectomy, chemotherapy and radiation with recurrence 2018. Now on Femara   Cervical radiculopathy 07/05/2019   COVID-19 02/2019   Her husband also passed away at this  time from COVID-19.   Family history of breast cancer    Genetic testing 10/30/2016   Breast/GYN panel (23 genes) @ [facility] - No pathogenic mutations detected   GERD (gastroesophageal reflux disease)    OTC medication   Grief reaction 02/23/2019   Heart murmur    as a child - Echo in early 20s "ok"   History of echocardiogram 05/2015   Echo 4/17: EF 55-60%, GLS -19.6%, normal wall motion   History of exercise stress test 05/2015   ETT 4/17 - No ST changes   HLD (hyperlipidemia)    Hypothyroidism    Multiple thyroid nodules 2009   Multiple nodules: Normal ultrasound reported at diagnosis   Osteoporosis    Personal history of chemotherapy    Personal history of radiation therapy    UNEQUAL LEG LENGTH, ACQUIRED 11/17/2006   Qualifier: Diagnosis of  By: Darrick Penna MD, KARL     Past Surgical History:  Procedure Laterality Date   ABDOMINAL HYSTERECTOMY     BREAST BIOPSY Right 07/01/2022   [place] RT BREAST BX W LOC DEV 1ST LESION IMG BX SPEC US GUIDE 07/01/2022 [facility]   BREAST BIOPSY  08/15/2022   [place] RT RADIOACTIVE SEED LOC 08/15/2022 [facility]   BREAST BIOPSY Right 10/02/2022   [place] RT BREAST BX  W LOC DEV 1ST LESION IMG BX SPEC US GUIDE 10/02/2022 [facility]   BREAST LUMPECTOMY Right 2003   BREAST LUMPECTOMY WITH RADIOACTIVE SEED LOCALIZATION Right 08/18/2022   Procedure: RIGHT BREAST LUMPECTOMY WITH RADIOACTIVE SEED LOCALIZATION;  Surgeon: Griselda Miner, MD;  Location: [facility];  Service: General;  Laterality: Right;   BREAST RECONSTRUCTION WITH PLACEMENT OF TISSUE EXPANDER AND FLEX HD (ACELLULAR HYDRATED DERMIS) Bilateral 11/17/2016   Procedure: IMEDITATE BREAST RECONSTRUCTION WITH PLACEMENT OF TISSUE EXPANDER AND FLEX HD (ACELLULAR HYDRATED DERMIS);  Surgeon: Peggye Form, DO;  Location: [facility];  Service: Plastics;  Laterality: Bilateral;   BREAST SURGERY  2003 /right   lumpectomy; chemotherapy and radiation   LIPOSUCTION WITH LIPOFILLING  Bilateral 02/23/2020   Procedure: Fat filling of breasts for symmetry from abdomen;  Surgeon: Peggye Form, DO;  Location: [facility];  Service: Plastics;  Laterality: Bilateral;  90 min, please   LIPOSUCTION WITH LIPOFILLING Bilateral 05/16/2021   Procedure: LIPOSUCTION WITH LIPOFILLING;  Surgeon: Peggye Form, DO;  Location: [facility];  Service: Plastics;  Laterality: Bilateral;  1.5 hours   REMOVAL OF BILATERAL TISSUE EXPANDERS WITH PLACEMENT OF BILATERAL BREAST IMPLANTS Bilateral 02/11/2017   Procedure: REMOVAL OF BILATERAL TISSUE EXPANDERS WITH PLACEMENT OF BILATERAL SILICONE BREAST IMPLANTS;  Surgeon: Peggye Form, DO;  Location: [facility];  Service: Plastics;  Laterality: Bilateral;   TOTAL ABDOMINAL HYSTERECTOMY W/ BILATERAL SALPINGOOPHORECTOMY     for treatment of breast treatment   TOTAL MASTECTOMY Bilateral 11/17/2016   Procedure: RIGHT MASTECTOMY AND LEFT PROPHYLATIC MASTECTOMY;  Surgeon: Griselda Miner, MD;  Location: [facility];  Service: General;  Laterality: Bilateral;   Family History  Problem Relation Age of Onset   Coronary artery disease Mother        1ST DEGREE RELATIVE   Heart attack Mother 61   Skin cancer Mother        unk type   Diabetes Father    Kidney disease Father    Prostate cancer Father 70       deceased 41   Cancer Brother 26       multiple myeloma    Lung cancer Maternal Uncle        x2 mat uncles; dx 64s-70s   Breast cancer Paternal Aunt 78   Breast cancer Cousin        x2 pat female cousins; dx 2s   Esophageal cancer Cousin 55       pat female cousin   Diabetes Other        1ST DEGREE RELATIVE   Colon cancer Neg Hx    Colon polyps Neg Hx    Stomach cancer Neg Hx    Rectal cancer Neg Hx    Allergies as of 05/05/2023   No Known Allergies      Medication List        Accurate as of May 05, 2023  2:35 PM. If you have any questions, ask your nurse or doctor.           atorvastatin 20 MG tablet Commonly known as: LIPITOR Take 1 tablet (20 mg total) by mouth daily.   Calcium 500 MG Chew Chew by mouth.   Collagenase Powd by Does not apply route.   diclofenac 75 MG EC tablet Commonly known as: VOLTAREN Take 1 tablet (75 mg total) by mouth daily.   doxycycline 100 MG tablet Commonly known as: VIBRA-TABS Take 1 tablet (100 mg  total) by mouth 2 (two) times daily. Started by: Felix Pacini   ELDERBERRY PO Take by mouth.   psyllium 58.6 % powder Commonly known as: METAMUCIL Take 1 packet by mouth daily.   tamoxifen 20 MG tablet Commonly known as: NOLVADEX TAKE 1 TABLET BY MOUTH EVERY DAY   Vitamin D (Cholecalciferol) 25 MCG (1000 UT) Caps Take 1 capsule by mouth daily.        All past medical history, surgical history, allergies, family history, immunizations andmedications were updated in the EMR today and reviewed under the history and medication portions of their EMR.     ROS Negative, with the exception of above mentioned in HPI   Objective:  BP 134/84   Pulse 81   Temp 98.1 F (36.7 C)   Wt 146 lb (66.2 kg)   SpO2 97%   BMI 25.06 kg/m  Body mass index is 25.06 kg/m. Physical Exam Vitals and nursing note reviewed.  Constitutional:      General: She is not in acute distress.    Appearance: Normal appearance. She is not ill-appearing, toxic-appearing or diaphoretic.  HENT:     Head: Normocephalic and atraumatic.     Right Ear: Tympanic membrane, ear canal and external ear normal.     Left Ear: Tympanic membrane, ear canal and external ear normal.     Nose: Congestion and rhinorrhea present.     Comments: PND    Mouth/Throat:     Pharynx: No oropharyngeal exudate or posterior oropharyngeal erythema.  Eyes:     General: No scleral icterus.       Right eye: No discharge.        Left eye: No discharge.     Extraocular Movements: Extraocular movements intact.     Conjunctiva/sclera: Conjunctivae normal.     Pupils:  Pupils are equal, round, and reactive to light.  Cardiovascular:     Rate and Rhythm: Normal rate and regular rhythm.  Pulmonary:     Effort: Pulmonary effort is normal. No respiratory distress.     Breath sounds: Normal breath sounds. No wheezing, rhonchi or rales.  Musculoskeletal:     Cervical back: Neck supple.     Right lower leg: No edema.     Left lower leg: No edema.  Lymphadenopathy:     Cervical: Cervical adenopathy present.  Skin:    General: Skin is warm.     Findings: No rash.  Neurological:     Mental Status: She is alert and oriented to person, place, and time. Mental status is at baseline.     Motor: No weakness.     Gait: Gait normal.  Psychiatric:        Mood and Affect: Mood normal.        Behavior: Behavior normal.        Thought Content: Thought content normal.        Judgment: Judgment normal.      No results found. No results found. No results found for this or any previous visit (from the past 24 hours).  Assessment/Plan: Olivia [name] is a 63 y.o. female present for OV for  Acute non-recurrent maxillary sinusitis (Primary) Rest, hydrate.  +/- flonase, nettie pot or nasal saline.  Doxy bid prescribed, take until completed.  F/U 2 weeks if not improved.    Reviewed expectations re: course of current medical issues. Discussed self-management of symptoms. Outlined signs and symptoms indicating need for more acute intervention. Patient verbalized understanding and all questions were  answered. Patient received an After-Visit Summary.    No orders of the defined types were placed in this encounter.  Meds ordered this encounter  Medications   doxycycline (VIBRA-TABS) 100 MG tablet    Sig: Take 1 tablet (100 mg total) by mouth 2 (two) times daily.    Dispense:  20 tablet    Refill:  0   Referral Orders  No referral(s) requested today     Note is dictated utilizing voice recognition software. Although note has been proof read prior to  signing, occasional typographical errors still can be missed. If any questions arise, please do not hesitate to call for verification.   electronically signed by:  Felix Pacini, DO  [facility] [place]

## 2023-05-19 ENCOUNTER — Other Ambulatory Visit: Payer: Self-pay | Admitting: Family Medicine

## 2023-05-19 DIAGNOSIS — E782 Mixed hyperlipidemia: Secondary | ICD-10-CM

## 2023-05-27 ENCOUNTER — Ambulatory Visit (INDEPENDENT_AMBULATORY_CARE_PROVIDER_SITE_OTHER): Payer: BC Managed Care – PPO | Admitting: Family Medicine

## 2023-05-27 ENCOUNTER — Encounter: Payer: Self-pay | Admitting: Family Medicine

## 2023-05-27 VITALS — BP 136/81 | HR 79 | Temp 98.2°F | Ht 64.0 in | Wt 144.8 lb

## 2023-05-27 DIAGNOSIS — E782 Mixed hyperlipidemia: Secondary | ICD-10-CM | POA: Diagnosis not present

## 2023-05-27 DIAGNOSIS — E042 Nontoxic multinodular goiter: Secondary | ICD-10-CM | POA: Diagnosis not present

## 2023-05-27 DIAGNOSIS — M8080XA Other osteoporosis with current pathological fracture, unspecified site, initial encounter for fracture: Secondary | ICD-10-CM | POA: Diagnosis not present

## 2023-05-27 DIAGNOSIS — M8080XS Other osteoporosis with current pathological fracture, unspecified site, sequela: Secondary | ICD-10-CM

## 2023-05-27 DIAGNOSIS — Z170 Estrogen receptor positive status [ER+]: Secondary | ICD-10-CM

## 2023-05-27 DIAGNOSIS — Z79899 Other long term (current) drug therapy: Secondary | ICD-10-CM

## 2023-05-27 DIAGNOSIS — R7309 Other abnormal glucose: Secondary | ICD-10-CM

## 2023-05-27 DIAGNOSIS — Z23 Encounter for immunization: Secondary | ICD-10-CM

## 2023-05-27 DIAGNOSIS — M1990 Unspecified osteoarthritis, unspecified site: Secondary | ICD-10-CM | POA: Diagnosis not present

## 2023-05-27 DIAGNOSIS — Z0000 Encounter for general adult medical examination without abnormal findings: Secondary | ICD-10-CM | POA: Diagnosis not present

## 2023-05-27 DIAGNOSIS — C50211 Malignant neoplasm of upper-inner quadrant of right female breast: Secondary | ICD-10-CM

## 2023-05-27 LAB — LIPID PANEL
Cholesterol: 172 mg/dL (ref 0–200)
HDL: 66.7 mg/dL (ref >39.00)
LDL Cholesterol: 80 mg/dL (ref 0–99)
NonHDL: 105.53
Total CHOL/HDL Ratio: 3
Triglycerides: 126 mg/dL (ref 0.0–149.0)
VLDL: 25.2 mg/dL (ref 0.0–40.0)

## 2023-05-27 LAB — MAGNESIUM: Magnesium: 2.3 mg/dL (ref 1.5–2.5)

## 2023-05-27 LAB — TSH: TSH: 2.4 u[IU]/mL (ref 0.35–5.50)

## 2023-05-27 LAB — HEMOGLOBIN A1C: Hgb A1c MFr Bld: 5.9 % (ref 4.6–6.5)

## 2023-05-27 LAB — VITAMIN D 25 HYDROXY (VIT D DEFICIENCY, FRACTURES): VITD: 40.13 ng/mL (ref 30.00–100.00)

## 2023-05-27 MED ORDER — DICLOFENAC SODIUM 75 MG PO TBEC: 75.0000 mg | DELAYED_RELEASE_TABLET | Freq: Every day | ORAL | 3 refills | Status: AC

## 2023-05-27 MED ORDER — ATORVASTATIN CALCIUM 20 MG PO TABS: 20.0000 mg | ORAL_TABLET | Freq: Every day | ORAL | 3 refills | Status: AC

## 2023-05-27 NOTE — Progress Notes (Signed)
 Patient ID: Olivia [name], female  DOB: [birth year], 63 y.o.   MRN: [id number] Patient Care Team    Relationship Specialty Notifications Start End  Mariel Shope, DO PCP - General Family Medicine  12/26/14   Sonja [place], MD Consulting Physician Hematology  10/15/16   Caralyn Chandler, MD Consulting Physician General Surgery  10/15/16   Retta Caster, MD Consulting Physician Radiation Oncology  10/15/16   Tessa Figures, MD Consulting Physician Gastroenterology  04/14/17   Woodrow Hazy, MD Consulting Physician Obstetrics and Gynecology  04/14/17   Percival Brace, NP Nurse Practitioner Hematology and Oncology  07/08/17   Burton, Lacie K, NP Nurse Practitioner Nurse Practitioner  12/16/22     Chief Complaint  Patient presents with   Annual Exam    Chronic Conditions/illness Management Pt is fasting.     Subjective:  Olivia [name] is a 63 y.o.  Female  present for CPE and Chronic Conditions/illness Management  All past medical history, surgical history, allergies, family history, immunizations, medications and social history were updated in the electronic medical record today. All recent labs, ED visits and hospitalizations within the last year were reviewed.  Health maintenance:  Colonoscopy: completed by Dr. Dominic Friendly January UTD 02/2022. Ten-year follow-up. Mammogram:  Double mastectomy 09/30/2016, patient being treated for recurrent breast cancer. N/i Cervical cancer screening: last pap: History of hysterectomy, pelvic exams completed by Dr. Steve El. Immunizations: tdap UTD 05/2021, Influenza UTD 2024(encouraged yearly). Shingrix series completed, PNA20 declined Infectious disease screening: HIV and  Hep C completed DEXA: History of osteoporosis, on prolia  injections every 6 months. Last DEXA scan 11/2021([facility]- ordered by speciliast team). Follow-up every 2 years  Patient has a Dental home. Hospitalizations/ED visits: reviewed   Arthritis:  She reports diclofenac1-2  x daily and it  is working well  for her. Mobic  had not been working well      09/16/2022    9:00 AM 05/26/2022    8:58 AM 11/27/2021   11:16 AM 05/23/2021    9:02 AM 02/28/2021   11:28 AM  Depression screen PHQ 2/9  Decreased Interest 0 0 0 0 0  Down, Depressed, Hopeless 0 0 0 0 0  PHQ - 2 Score 0 0 0 0 0      04/22/2018    9:14 AM  GAD 7 : Generalized Anxiety Score  Nervous, Anxious, on Edge 1  Control/stop worrying 1  Worry too much - different things 1  Trouble relaxing 0  Restless 0  Easily annoyed or irritable 1  Afraid - awful might happen 0  Total GAD 7 Score 4  Anxiety Difficulty Not difficult at all    Immunization History  Administered Date(s) Administered   Influenza Split 11/26/2011   Influenza, Seasonal, Injecte, Preservative Fre 10/31/2022   Influenza,inj,Quad PF,6+ Mos 11/20/2013, 10/20/2018, 11/04/2019, 11/26/2020, 11/27/2021   Influenza,inj,quad, With Preservative 11/18/2018   Influenza-Unspecified 12/05/2014, 10/03/2015, 11/10/2016, 10/04/2017   PFIZER Comirnaty(Gray Top)Covid-19 Tri-Sucrose Vaccine 07/20/2020   PFIZER(Purple Top)SARS-COV-2 Vaccination 05/12/2019, 06/06/2019, 12/07/2019   PNEUMOCOCCAL CONJUGATE-20 05/27/2023   Rabies, IM 03/10/2023, 03/13/2023, 03/17/2023, 03/24/2023, 04/07/2023   Tdap 01/29/2012, 05/23/2021   Unspecified SARS-COV-2 Vaccination 05/12/2019, 06/06/2019, 12/07/2019, 07/20/2020   Zoster Recombinant(Shingrix) 06/03/2017, 08/18/2017     Past Medical History:  Diagnosis Date   Adjustment insomnia 02/23/2019   Breast cancer (HCC) 2018   Lumpectomy, chemotherapy and radiation with recurrence 2018. Now on Femara    Cervical radiculopathy 07/05/2019   COVID-19 02/2019   Her husband also passed  away at this time from COVID-19.   Essential hypertension 08/10/2020   Family history of breast cancer    Fracture of vertebra due to osteoporosis (HCC) 11/27/2021   Genetic testing 10/30/2016   Breast/GYN panel (23 genes) @ [facility] -  No pathogenic mutations detected   GERD (gastroesophageal reflux disease)    OTC medication   Grief reaction 02/23/2019   Heart murmur    as a child - Echo in early 20s "ok"   History of echocardiogram 05/2015   Echo 4/17: EF 55-60%, GLS -19.6%, normal wall motion   History of exercise stress test 05/2015   ETT 4/17 - No ST changes   HLD (hyperlipidemia)    Hypothyroidism    Mixed emotional features as adjustment reaction 11/09/2015   Multiple thyroid  nodules 2009   Multiple nodules: Normal ultrasound reported at diagnosis   Nondisplaced fracture of triquetral bone of right wrist with routine healing 11/27/2021   Osteoporosis    Personal history of chemotherapy    Personal history of radiation therapy    UNEQUAL LEG LENGTH, ACQUIRED 11/17/2006   Qualifier: Diagnosis of  By: Nolene Baumgarten MD, KARL     Vasovagal syncope 11/27/2021   No Known Allergies Past Surgical History:  Procedure Laterality Date   ABDOMINAL HYSTERECTOMY     BREAST BIOPSY Right 07/01/2022   US  RT BREAST BX W LOC DEV 1ST LESION IMG BX SPEC US  GUIDE 07/01/2022 [facility]   BREAST BIOPSY  08/15/2022   US  RT RADIOACTIVE SEED LOC 08/15/2022 [facility]   BREAST BIOPSY Right 10/02/2022   US  RT BREAST BX W LOC DEV 1ST LESION IMG BX SPEC US  GUIDE 10/02/2022 [facility]   BREAST LUMPECTOMY Right 2003   BREAST LUMPECTOMY WITH RADIOACTIVE SEED LOCALIZATION Right 08/18/2022   Procedure: RIGHT BREAST LUMPECTOMY WITH RADIOACTIVE SEED LOCALIZATION;  Surgeon: Caralyn Chandler, MD;  Location: [facility];  Service: General;  Laterality: Right;   BREAST RECONSTRUCTION WITH PLACEMENT OF TISSUE EXPANDER AND FLEX HD (ACELLULAR HYDRATED DERMIS) Bilateral 11/17/2016   Procedure: IMEDITATE BREAST RECONSTRUCTION WITH PLACEMENT OF TISSUE EXPANDER AND FLEX HD (ACELLULAR HYDRATED DERMIS);  Surgeon: Thornell Flirt, DO;  Location: [facility];  Service: Plastics;  Laterality: Bilateral;   BREAST SURGERY  2003 /right    lumpectomy; chemotherapy and radiation   LIPOSUCTION WITH LIPOFILLING Bilateral 02/23/2020   Procedure: Fat filling of breasts for symmetry from abdomen;  Surgeon: Thornell Flirt, DO;  Location: [facility];  Service: Plastics;  Laterality: Bilateral;  90 min, please   LIPOSUCTION WITH LIPOFILLING Bilateral 05/16/2021   Procedure: LIPOSUCTION WITH LIPOFILLING;  Surgeon: Thornell Flirt, DO;  Location: [facility];  Service: Plastics;  Laterality: Bilateral;  1.5 hours   REMOVAL OF BILATERAL TISSUE EXPANDERS WITH PLACEMENT OF BILATERAL BREAST IMPLANTS Bilateral 02/11/2017   Procedure: REMOVAL OF BILATERAL TISSUE EXPANDERS WITH PLACEMENT OF BILATERAL SILICONE BREAST IMPLANTS;  Surgeon: Thornell Flirt, DO;  Location: [facility];  Service: Plastics;  Laterality: Bilateral;   TOTAL ABDOMINAL HYSTERECTOMY W/ BILATERAL SALPINGOOPHORECTOMY     for treatment of breast treatment   TOTAL MASTECTOMY Bilateral 11/17/2016   Procedure: RIGHT MASTECTOMY AND LEFT PROPHYLATIC MASTECTOMY;  Surgeon: Caralyn Chandler, MD;  Location: [facility];  Service: General;  Laterality: Bilateral;   Family History  Problem Relation Age of Onset   Coronary artery disease Mother        1ST DEGREE RELATIVE   Heart attack Mother 70   Skin  cancer Mother        unk type   Diabetes Father    Kidney disease Father    Prostate cancer Father 62       deceased 30   Cancer Brother 4       multiple myeloma    Lung cancer Maternal Uncle        x2 mat uncles; dx 69s-70s   Breast cancer Paternal Aunt 35   Breast cancer Cousin        x2 pat female cousins; dx 42s   Esophageal cancer Cousin 86       pat female cousin   Diabetes Other        1ST DEGREE RELATIVE   Colon cancer Neg Hx    Colon polyps Neg Hx    Stomach cancer Neg Hx    Rectal cancer Neg Hx    Social History   Social History Narrative   Married. Bobby.   Children: none. No pets.    Stay at home.    Wears her  seatbelt. Smoke alarm in the home.    Feels safe in her relationships.    Exercise 5x week.     Allergies as of 05/27/2023   No Known Allergies      Medication List        Accurate as of May 27, 2023 11:59 PM. If you have any questions, ask your nurse or doctor.          STOP taking these medications    doxycycline  100 MG tablet Commonly known as: VIBRA -TABS Stopped by: Napolean Backbone       TAKE these medications    atorvastatin  20 MG tablet Commonly known as: LIPITOR Take 1 tablet (20 mg total) by mouth daily.   Calcium  500 MG Chew Chew by mouth.   Collagenase Powd by Does not apply route.   diclofenac  75 MG EC tablet Commonly known as: VOLTAREN  Take 1 tablet (75 mg total) by mouth daily.   ELDERBERRY PO Take by mouth.   psyllium 58.6 % powder Commonly known as: METAMUCIL Take 1 packet by mouth daily.   tamoxifen  20 MG tablet Commonly known as: NOLVADEX  TAKE 1 TABLET BY MOUTH EVERY DAY   Vitamin D  (Cholecalciferol) 25 MCG (1000 UT) Caps Take 1 capsule by mouth daily.        All past medical history, surgical history, allergies, family history, immunizations andmedications were updated in the EMR today and reviewed under the history and medication portions of their EMR.     Recent Results (from the past 2160 hours)  CMP (Cancer Center only)     Status: Abnormal   Collection Time: 04/07/23 10:38 AM  Result Value Ref Range   Sodium 142 135 - 145 mmol/L   Potassium 5.1 3.5 - 5.1 mmol/L   Chloride 106 98 - 111 mmol/L   CO2 30 22 - 32 mmol/L   Glucose, Bld 121 (H) 70 - 99 mg/dL    Comment: Glucose reference range applies only to samples taken after fasting for at least 8 hours.   BUN 16 8 - 23 mg/dL   Creatinine 4.09 8.11 - 1.00 mg/dL   Calcium  10.1 8.9 - 10.3 mg/dL   Total Protein 6.8 6.5 - 8.1 g/dL   Albumin 4.6 3.5 - 5.0 g/dL   AST 24 15 - 41 U/L   ALT 19 0 - 44 U/L   Alkaline Phosphatase 40 38 - 126 U/L   Total Bilirubin 0.6 0.0 -  1.2  mg/dL   GFR, Estimated >40 >98 mL/min    Comment: (NOTE) Calculated using the CKD-EPI Creatinine Equation (2021)    Anion gap 6 5 - 15    Comment: Performed at [facility], [address] [place], [place] [address]  CBC with Differential (Cancer Center Only)     Status: None   Collection Time: 04/07/23 10:38 AM  Result Value Ref Range   WBC Count 5.4 4.0 - 10.5 K/uL   RBC 4.71 3.87 - 5.11 MIL/uL   Hemoglobin 14.1 12.0 - 15.0 g/dL   HCT 78.2 95.6 - 21.3 %   MCV 90.2 80.0 - 100.0 fL   MCH 29.9 26.0 - 34.0 pg   MCHC 33.2 30.0 - 36.0 g/dL   RDW 08.6 57.8 - 46.9 %   Platelet Count 240 150 - 400 K/uL   nRBC 0.0 0.0 - 0.2 %   Neutrophils Relative % 65 %   Neutro Abs 3.5 1.7 - 7.7 K/uL   Lymphocytes Relative 28 %   Lymphs Abs 1.5 0.7 - 4.0 K/uL   Monocytes Relative 6 %   Monocytes Absolute 0.3 0.1 - 1.0 K/uL   Eosinophils Relative 1 %   Eosinophils Absolute 0.0 0.0 - 0.5 K/uL   Basophils Relative 0 %   Basophils Absolute 0.0 0.0 - 0.1 K/uL   Immature Granulocytes 0 %   Abs Immature Granulocytes 0.01 0.00 - 0.07 K/uL    Comment: Performed at [facility], [address], [place], [place] [address]  Hemoglobin A1c     Status: None   Collection Time: 05/27/23  8:02 AM  Result Value Ref Range   Hgb A1c MFr Bld 5.9 4.6 - 6.5 %    Comment: Glycemic Control Guidelines for People with Diabetes:Non Diabetic:  <6%Goal of Therapy: <7%Additional Action Suggested:  >8%   Lipid panel     Status: None   Collection Time: 05/27/23  8:02 AM  Result Value Ref Range   Cholesterol 172 0 - 200 mg/dL    Comment: ATP III Classification       Desirable:  < 200 mg/dL               Borderline High:  200 - 239 mg/dL          High:  > = 841 mg/dL   Triglycerides 324.4 0.0 - 149.0 mg/dL    Comment: Normal:  <010 mg/dLBorderline High:  150 - 199 mg/dL   HDL 27.25 >36.64 mg/dL   VLDL 40.3 0.0 - 47.4 mg/dL   LDL Cholesterol 80 0 - 99 mg/dL   Total CHOL/HDL  Ratio 3     Comment:                Men          Women1/2 Average Risk     3.4          3.3Average Risk          5.0          4.42X Average Risk          9.6          7.13X Average Risk          15.0          11.0                       NonHDL 105.53     Comment: NOTE:  Non-HDL goal should be 30 mg/dL higher than patient's LDL goal (i.e. LDL goal of < 70 mg/dL, would have non-HDL goal of < 100 mg/dL)  TSH     Status: None   Collection Time: 05/27/23  8:02 AM  Result Value Ref Range   TSH 2.40 0.35 - 5.50 uIU/mL  Vitamin D  (25 hydroxy)     Status: None   Collection Time: 05/27/23  8:02 AM  Result Value Ref Range   VITD 40.13 30.00 - 100.00 ng/mL  Magnesium     Status: None   Collection Time: 05/27/23  8:02 AM  Result Value Ref Range   Magnesium 2.3 1.5 - 2.5 mg/dL     No results found.   ROS 14 pt review of systems performed and negative (unless mentioned in an HPI)  Objective: BP 136/81   Pulse 79   Temp 98.2 F (36.8 C)   Ht 5\' 4"  (1.626 m)   Wt 144 lb 12.8 oz (65.7 kg)   SpO2 98%   BMI 24.85 kg/m  Physical Exam Vitals and nursing note reviewed.  Constitutional:      General: She is not in acute distress.    Appearance: Normal appearance. She is not ill-appearing or toxic-appearing.  HENT:     Head: Normocephalic and atraumatic.     Right Ear: Tympanic membrane, ear canal and external ear normal. There is no impacted cerumen.     Left Ear: Tympanic membrane, ear canal and external ear normal. There is no impacted cerumen.     Nose: No congestion or rhinorrhea.     Mouth/Throat:     Mouth: Mucous membranes are moist.     Pharynx: Oropharynx is clear. No oropharyngeal exudate or posterior oropharyngeal erythema.  Eyes:     General: No scleral icterus.       Right eye: No discharge.        Left eye: No discharge.     Extraocular Movements: Extraocular movements intact.     Conjunctiva/sclera: Conjunctivae normal.     Pupils: Pupils are equal, round, and reactive to  light.  Neck:     Comments: Enlarged thyroid , nodule palpated right inferior consistent with prior ultrasound Cardiovascular:     Rate and Rhythm: Normal rate and regular rhythm.     Pulses: Normal pulses.     Heart sounds: Normal heart sounds. No murmur heard.    No friction rub. No gallop.  Pulmonary:     Effort: Pulmonary effort is normal. No respiratory distress.     Breath sounds: Normal breath sounds. No stridor. No wheezing, rhonchi or rales.  Chest:     Chest wall: No tenderness.  Abdominal:     General: Abdomen is flat. Bowel sounds are normal. There is no distension.     Palpations: Abdomen is soft. There is no mass.     Tenderness: There is no abdominal tenderness. There is no right CVA tenderness, left CVA tenderness, guarding or rebound.     Hernia: No hernia is present.  Musculoskeletal:        General: No swelling, tenderness or deformity. Normal range of motion.     Cervical back: Normal range of motion and neck supple. No rigidity or tenderness.     Right lower leg: No edema.     Left lower leg: No edema.  Lymphadenopathy:     Cervical: No cervical adenopathy.  Skin:    General: Skin is warm and dry.     Coloration: Skin is  not jaundiced or pale.     Findings: No bruising, erythema, lesion or rash.  Neurological:     General: No focal deficit present.     Mental Status: She is alert and oriented to person, place, and time. Mental status is at baseline.     Cranial Nerves: No cranial nerve deficit.     Sensory: No sensory deficit.     Motor: No weakness.     Coordination: Coordination normal.     Gait: Gait normal.     Deep Tendon Reflexes: Reflexes normal.  Psychiatric:        Mood and Affect: Mood normal.        Behavior: Behavior normal.        Thought Content: Thought content normal.        Judgment: Judgment normal.      Assessment/plan: Olivia [name] is a 63 y.o. female present for CPE and Chronic Conditions/illness Management osteoporosis,  unspecified pathological fracture presence Completed infusions x4 in place of prolia .  - DEXA UTD 01/2022 due 11/2023 - vit d collected today Malignant neoplasm of upper-inner quadrant of right breast in female, estrogen receptor positive (HCC) Follows closely with onc team Arthritis: Stable Continue diclofenac  qd Mixed hyperlipidemia/on statin therpay Stable Continue lipitor - lipid panel collected today Elevated A1c: - A1c collected today Thyroid  nodules: Follow-up imaging every 2 years due in 01/2024-ordered for med [facility] Encounter for preventive health examination Patient was encouraged to exercise greater than 150 minutes a week. Patient was encouraged to choose a diet filled with fresh fruits and vegetables, and lean meats. AVS provided to patient today for education/recommendation on gender specific health and safety maintenance. Colonoscopy: completed by Dr. Dominic Friendly January UTD 02/2022. Ten-year follow-up. Mammogram:  Double mastectomy 09/30/2016, patient being treated for recurrent breast cancer. N/i Cervical cancer screening: last pap: History of hysterectomy, pelvic exams completed by Dr. Steve El. Immunizations: tdap UTD 05/2021, Influenza UTD 2024(encouraged yearly). Shingrix series completed, PNA20 declined Infectious disease screening: HIV and  Hep C completed DEXA: History of osteoporosis, on prolia  injections every 6 months. Last DEXA scan 11/2021([facility]- ordered by speciliast team). Follow-up every 2 years   Return in about 1 year (around 05/27/2024) for cpe (20 min), Routine chronic condition follow-up.   Orders Placed This Encounter  Procedures   US  THYROID    Pneumococcal conjugate vaccine 20-valent   Hemoglobin A1c   Lipid panel   TSH   Vitamin D  (25 hydroxy)   Magnesium   Meds ordered this encounter  Medications   atorvastatin  (LIPITOR) 20 MG tablet    Sig: Take 1 tablet (20 mg total) by mouth daily.    Dispense:  90 tablet    Refill:  3    diclofenac  (VOLTAREN ) 75 MG EC tablet    Sig: Take 1 tablet (75 mg total) by mouth daily.    Dispense:  90 tablet    Refill:  3   Referral Orders  No referral(s) requested today      Electronically signed by: Napolean Backbone, DO [facility] [place]

## 2023-05-27 NOTE — Patient Instructions (Addendum)

## 2023-05-28 ENCOUNTER — Encounter: Payer: Self-pay | Admitting: Family Medicine

## 2023-05-28 NOTE — Addendum Note (Signed)
 Addended by: Napolean Backbone A on: 05/28/2023 02:52 PM   Modules accepted: Orders

## 2023-05-30 ENCOUNTER — Other Ambulatory Visit: Payer: Self-pay | Admitting: Hematology

## 2023-06-05 ENCOUNTER — Ambulatory Visit: Admitting: Family Medicine

## 2023-06-05 ENCOUNTER — Encounter: Payer: Self-pay | Admitting: Family Medicine

## 2023-06-05 VITALS — BP 122/84 | HR 76 | Temp 98.0°F | Wt 142.8 lb

## 2023-06-05 DIAGNOSIS — U071 COVID-19: Secondary | ICD-10-CM

## 2023-06-05 DIAGNOSIS — R051 Acute cough: Secondary | ICD-10-CM | POA: Diagnosis not present

## 2023-06-05 LAB — POC COVID19 BINAXNOW: SARS Coronavirus 2 Ag: POSITIVE — AB

## 2023-06-05 MED ORDER — PREDNISONE 20 MG PO TABS: 40.0000 mg | ORAL_TABLET | Freq: Every day | ORAL | 0 refills | Status: AC

## 2023-06-05 MED ORDER — HYDROCOD POLI-CHLORPHE POLI ER 10-8 MG/5ML PO SUER: 5.0000 mL | Freq: Two times a day (BID) | ORAL | 0 refills | Status: DC | PRN

## 2023-06-05 MED ORDER — AZITHROMYCIN 250 MG PO TABS: ORAL_TABLET | ORAL | 0 refills | Status: AC

## 2023-06-05 MED ORDER — NIRMATRELVIR/RITONAVIR (PAXLOVID)TABLET: 3.0000 | ORAL_TABLET | Freq: Two times a day (BID) | ORAL | 0 refills | Status: AC

## 2023-06-05 NOTE — Patient Instructions (Addendum)
 Return in about 2 weeks (around 06/19/2023).    [email or web address]       Great to see you today.  I have refilled the medication(s) we provide.   If labs were collected or images ordered, we will inform you of  results once we have received them and reviewed. We will contact you either by echart message, or telephone call.  Please give ample time to the testing facility, and our office to run,  receive and review results. Please do not call inquiring of results, even if you can see them in your chart. We will contact you as soon as we are able. If it has been over 1 week since the test was completed, and you have not yet heard from us , then please call us .    - echart message- for normal results that have been seen by the patient already.   - telephone call: abnormal results or if patient has not viewed results in their echart.  If a referral to a specialist was entered for you, please call us  in 2 weeks if you have not heard from the specialist office to schedule.

## 2023-06-05 NOTE — Progress Notes (Signed)
 Olivia [name] , [birth year], 63 y.o., female MRN: [id number] Patient Care Team    Relationship Specialty Notifications Start End  Mariel Shope, DO PCP - General Family Medicine  12/26/14   Sonja Shoreacres, MD Consulting Physician Hematology  10/15/16   Caralyn Chandler, MD Consulting Physician General Surgery  10/15/16   Retta Caster, MD Consulting Physician Radiation Oncology  10/15/16   Tessa Figures, MD Consulting Physician Gastroenterology  04/14/17   Woodrow Hazy, MD Consulting Physician Obstetrics and Gynecology  04/14/17   Percival Brace, NP Nurse Practitioner Hematology and Oncology  07/08/17   Burton, Lacie K, NP Nurse Practitioner Nurse Practitioner  12/16/22     Chief Complaint  Patient presents with   Cough    Finished abx 3 weeks ago cough came back yesterday      Subjective: [name] is a 63 y.o. Pt presents for an OV with complaints of cough of 2 days duration.  Associated symptoms include headache, fatigue.no fever or chills (see ros). Pt has tried tessalon  perles and mucinex to ease their symptoms.  She was at the airport Wednesday night picking up family. No sick contacts she is aware of. Pt is immunocompromised.      09/16/2022    9:00 AM 05/26/2022    8:58 AM 11/27/2021   11:16 AM 05/23/2021    9:02 AM 02/28/2021   11:28 AM  Depression screen PHQ 2/9  Decreased Interest 0 0 0 0 0  Down, Depressed, Hopeless 0 0 0 0 0  PHQ - 2 Score 0 0 0 0 0    No Known Allergies Social History   Social History Narrative   Married. Bobby.   Children: none. No pets.    Stay at home.    Wears her seatbelt. Smoke alarm in the home.    Feels safe in her relationships.    Exercise 5x week.    Past Medical History:  Diagnosis Date   Adjustment insomnia 02/23/2019   Breast cancer (HCC) 2018   Lumpectomy, chemotherapy and radiation with recurrence 2018. Now on Femara    Cervical radiculopathy 07/05/2019   COVID-19 02/2019   Her husband also passed  away at this time from COVID-19.   Essential hypertension 08/10/2020   Family history of breast cancer    Fracture of vertebra due to osteoporosis (HCC) 11/27/2021   Genetic testing 10/30/2016   Breast/GYN panel (23 genes) @ [facility] - No pathogenic mutations detected   GERD (gastroesophageal reflux disease)    OTC medication   Grief reaction 02/23/2019   Heart murmur    as a child - Echo in early 20s "ok"   History of echocardiogram 05/2015   Echo 4/17: EF 55-60%, GLS -19.6%, normal wall motion   History of exercise stress test 05/2015   ETT 4/17 - No ST changes   HLD (hyperlipidemia)    Hypothyroidism    Mixed emotional features as adjustment reaction 11/09/2015   Multiple thyroid  nodules 2009   Multiple nodules: Normal ultrasound reported at diagnosis   Nondisplaced fracture of triquetral bone of right wrist with routine healing 11/27/2021   Osteoporosis    Personal history of chemotherapy    Personal history of radiation therapy    UNEQUAL LEG LENGTH, ACQUIRED 11/17/2006   Qualifier: Diagnosis of  By: Nolene Baumgarten MD, KARL     Vasovagal syncope 11/27/2021   Past Surgical History:  Procedure Laterality Date   ABDOMINAL HYSTERECTOMY     BREAST  BIOPSY Right 07/01/2022   US  RT BREAST BX W LOC DEV 1ST LESION IMG BX SPEC US  GUIDE 07/01/2022 [facility]   BREAST BIOPSY  08/15/2022   US  RT RADIOACTIVE SEED LOC 08/15/2022 [facility]   BREAST BIOPSY Right 10/02/2022   US  RT BREAST BX W LOC DEV 1ST LESION IMG BX SPEC US  GUIDE 10/02/2022 [facility]   BREAST LUMPECTOMY Right 2003   BREAST LUMPECTOMY WITH RADIOACTIVE SEED LOCALIZATION Right 08/18/2022   Procedure: RIGHT BREAST LUMPECTOMY WITH RADIOACTIVE SEED LOCALIZATION;  Surgeon: Caralyn Chandler, MD;  Location: [facility];  Service: General;  Laterality: Right;   BREAST RECONSTRUCTION WITH PLACEMENT OF TISSUE EXPANDER AND FLEX HD (ACELLULAR HYDRATED DERMIS) Bilateral 11/17/2016   Procedure: IMEDITATE BREAST  RECONSTRUCTION WITH PLACEMENT OF TISSUE EXPANDER AND FLEX HD (ACELLULAR HYDRATED DERMIS);  Surgeon: Thornell Flirt, DO;  Location: [facility];  Service: Plastics;  Laterality: Bilateral;   BREAST SURGERY  2003 /right   lumpectomy; chemotherapy and radiation   LIPOSUCTION WITH LIPOFILLING Bilateral 02/23/2020   Procedure: Fat filling of breasts for symmetry from abdomen;  Surgeon: Thornell Flirt, DO;  Location: [facility];  Service: Plastics;  Laterality: Bilateral;  90 min, please   LIPOSUCTION WITH LIPOFILLING Bilateral 05/16/2021   Procedure: LIPOSUCTION WITH LIPOFILLING;  Surgeon: Thornell Flirt, DO;  Location: [facility];  Service: Plastics;  Laterality: Bilateral;  1.5 hours   REMOVAL OF BILATERAL TISSUE EXPANDERS WITH PLACEMENT OF BILATERAL BREAST IMPLANTS Bilateral 02/11/2017   Procedure: REMOVAL OF BILATERAL TISSUE EXPANDERS WITH PLACEMENT OF BILATERAL SILICONE BREAST IMPLANTS;  Surgeon: Thornell Flirt, DO;  Location: [facility];  Service: Plastics;  Laterality: Bilateral;   TOTAL ABDOMINAL HYSTERECTOMY W/ BILATERAL SALPINGOOPHORECTOMY     for treatment of breast treatment   TOTAL MASTECTOMY Bilateral 11/17/2016   Procedure: RIGHT MASTECTOMY AND LEFT PROPHYLATIC MASTECTOMY;  Surgeon: Caralyn Chandler, MD;  Location: [facility];  Service: General;  Laterality: Bilateral;   Family History  Problem Relation Age of Onset   Coronary artery disease Mother        1ST DEGREE RELATIVE   Heart attack Mother 58   Skin cancer Mother        unk type   Diabetes Father    Kidney disease Father    Prostate cancer Father 31       deceased 56   Cancer Brother 19       multiple myeloma    Lung cancer Maternal Uncle        x2 mat uncles; dx 62s-70s   Breast cancer Paternal Aunt 61   Breast cancer Cousin        x2 pat female cousins; dx 28s   Esophageal cancer Cousin 48       pat female cousin   Diabetes Other        1ST DEGREE RELATIVE    Colon cancer Neg Hx    Colon polyps Neg Hx    Stomach cancer Neg Hx    Rectal cancer Neg Hx    Allergies as of 06/05/2023   No Known Allergies      Medication List        Accurate as of Jun 05, 2023  8:47 AM. If you have any questions, ask your nurse or doctor.          atorvastatin  20 MG tablet Commonly known as: LIPITOR Take 1 tablet (20 mg total) by mouth daily.   azithromycin  250 MG tablet Commonly known as: ZITHROMAX Take 2 tablets on day 1, then 1 tablet daily on days 2 through 5 Started by: Napolean Backbone   Calcium  500 MG Chew Chew by mouth.   chlorpheniramine-HYDROcodone  10-8 MG/5ML Commonly known as: TUSSIONEX Take 5 mLs by mouth every 12 (twelve) hours as needed for cough. Started by: Napolean Backbone   Collagenase Powd by Does not apply route.   diclofenac  75 MG EC tablet Commonly known as: VOLTAREN  Take 1 tablet (75 mg total) by mouth daily.   ELDERBERRY PO Take by mouth.   nirmatrelvir /ritonavir  20 x 150 MG & 10 x 100MG  Tabs Commonly known as: PAXLOVID  Take 3 tablets by mouth 2 (two) times daily for 5 days. (Take nirmatrelvir  150 mg two tablets twice daily for 5 days and ritonavir  100 mg one tablet twice daily for 5 days) Patient GFR is normal Started by: Napolean Backbone   predniSONE  20 MG tablet Commonly known as: DELTASONE  Take 2 tablets (40 mg total) by mouth daily with breakfast for 5 days. Started by: Tarisa Paola   psyllium 58.6 % powder Commonly known as: METAMUCIL Take 1 packet by mouth daily.   tamoxifen  20 MG tablet Commonly known as: NOLVADEX  TAKE 1 TABLET BY MOUTH EVERY DAY   Vitamin D  (Cholecalciferol) 25 MCG (1000 UT) Caps Take 1 capsule by mouth daily.        All past medical history, surgical history, allergies, family history, immunizations andmedications were updated in the EMR today and reviewed under the history and medication portions of their EMR.     Review of Systems  Constitutional:  Positive for malaise/fatigue.  Negative for chills and fever.  HENT:  Positive for congestion, sinus pain and sore throat.   Respiratory:  Positive for cough and sputum production.   Gastrointestinal: Negative.   Genitourinary: Negative.   Neurological:  Positive for headaches.   Negative, with the exception of above mentioned in HPI   Objective:  BP 122/84   Pulse 76   Temp 98 F (36.7 C)   Wt 142 lb 12.8 oz (64.8 kg)   SpO2 98%   BMI 24.51 kg/m  Body mass index is 24.51 kg/m. Physical Exam Vitals and nursing note reviewed.  Constitutional:      General: She is not in acute distress.    Appearance: Normal appearance. She is normal weight. She is not ill-appearing or toxic-appearing.  HENT:     Head: Normocephalic and atraumatic.     Right Ear: Tympanic membrane normal.     Left Ear: Tympanic membrane normal.     Nose: Congestion and rhinorrhea present.     Mouth/Throat:     Mouth: Mucous membranes are moist.     Pharynx: No oropharyngeal exudate or posterior oropharyngeal erythema.  Eyes:     General: No scleral icterus.       Right eye: No discharge.        Left eye: No discharge.     Extraocular Movements: Extraocular movements intact.     Conjunctiva/sclera: Conjunctivae normal.     Pupils: Pupils are equal, round, and reactive to light.  Cardiovascular:     Rate and Rhythm: Normal rate and regular rhythm.     Heart sounds: No murmur heard. Pulmonary:     Effort: Pulmonary effort is normal. No respiratory distress.     Breath sounds: Normal breath sounds. No wheezing, rhonchi or rales.  Musculoskeletal:     Cervical back: Neck supple.  Lymphadenopathy:  Cervical: Cervical adenopathy present.  Skin:    Findings: No rash.  Neurological:     Mental Status: She is alert and oriented to person, place, and time. Mental status is at baseline.     Motor: No weakness.     Coordination: Coordination normal.     Gait: Gait normal.  Psychiatric:        Mood and Affect: Mood normal.         Behavior: Behavior normal.        Thought Content: Thought content normal.        Judgment: Judgment normal.      No results found. No results found. Results for orders placed or performed in visit on 06/05/23 (from the past 24 hours)  POC COVID-19 BinaxNow     Status: Abnormal   Collection Time: 06/05/23  7:51 AM  Result Value Ref Range   SARS Coronavirus 2 Ag Positive (A) Negative    Assessment/Plan: TONIQUA COZBY is a 63 y.o. female present for OV for  Acute cough (Primary) - POC COVID-19 BinaxNow> positive COVID-19 Rest, hydrate.  +/- flonase, mucinex (DM if cough), nettie pot or nasal saline.  paxlovid  prescribed, take until completed. With info on paxaccess.  Azith start Monday or after paxlovid  use Prednisone  x5d If cough present it can last up to 6-8 weeks.  F/U 2 weeks if not improved.   Reviewed expectations re: course of current medical issues. Discussed self-management of symptoms. Outlined signs and symptoms indicating need for more acute intervention. Patient verbalized understanding and all questions were answered. Patient received an After-Visit Summary.    Orders Placed This Encounter  Procedures   POC COVID-19 BinaxNow   Meds ordered this encounter  Medications   predniSONE  (DELTASONE ) 20 MG tablet    Sig: Take 2 tablets (40 mg total) by mouth daily with breakfast for 5 days.    Dispense:  10 tablet    Refill:  0   nirmatrelvir /ritonavir  (PAXLOVID ) 20 x 150 MG & 10 x 100MG  TABS    Sig: Take 3 tablets by mouth 2 (two) times daily for 5 days. (Take nirmatrelvir  150 mg two tablets twice daily for 5 days and ritonavir  100 mg one tablet twice daily for 5 days) Patient GFR is normal    Dispense:  30 tablet    Refill:  0   azithromycin (ZITHROMAX) 250 MG tablet    Sig: Take 2 tablets on day 1, then 1 tablet daily on days 2 through 5    Dispense:  6 tablet    Refill:  0   chlorpheniramine-HYDROcodone  (TUSSIONEX) 10-8 MG/5ML    Sig: Take 5 mLs by  mouth every 12 (twelve) hours as needed for cough.    Dispense:  70 mL    Refill:  0   Referral Orders  No referral(s) requested today     Note is dictated utilizing voice recognition software. Although note has been proof read prior to signing, occasional typographical errors still can be missed. If any questions arise, please do not hesitate to call for verification.   electronically signed by:  Napolean Backbone, DO  [facility] - [facility]

## 2023-06-16 ENCOUNTER — Encounter: Payer: Self-pay | Admitting: Family Medicine

## 2023-07-06 ENCOUNTER — Telehealth: Payer: Self-pay | Admitting: Hematology

## 2023-07-07 ENCOUNTER — Ambulatory Visit

## 2023-07-07 ENCOUNTER — Inpatient Hospital Stay: Attending: Nurse Practitioner

## 2023-07-07 ENCOUNTER — Other Ambulatory Visit

## 2023-07-07 ENCOUNTER — Inpatient Hospital Stay

## 2023-07-07 VITALS — BP 131/81 | HR 63 | Temp 98.0°F | Resp 18 | Wt 147.5 lb

## 2023-07-07 DIAGNOSIS — Z9011 Acquired absence of right breast and nipple: Secondary | ICD-10-CM | POA: Diagnosis not present

## 2023-07-07 DIAGNOSIS — Z79810 Long term (current) use of selective estrogen receptor modulators (SERMs): Secondary | ICD-10-CM | POA: Insufficient documentation

## 2023-07-07 DIAGNOSIS — Z1721 Progesterone receptor positive status: Secondary | ICD-10-CM | POA: Insufficient documentation

## 2023-07-07 DIAGNOSIS — Z1732 Human epidermal growth factor receptor 2 negative status: Secondary | ICD-10-CM | POA: Diagnosis not present

## 2023-07-07 DIAGNOSIS — M810 Age-related osteoporosis without current pathological fracture: Secondary | ICD-10-CM | POA: Diagnosis not present

## 2023-07-07 DIAGNOSIS — C50211 Malignant neoplasm of upper-inner quadrant of right female breast: Secondary | ICD-10-CM | POA: Insufficient documentation

## 2023-07-07 DIAGNOSIS — M8000XD Age-related osteoporosis with current pathological fracture, unspecified site, subsequent encounter for fracture with routine healing: Secondary | ICD-10-CM

## 2023-07-07 DIAGNOSIS — Z170 Estrogen receptor positive status [ER+]: Secondary | ICD-10-CM | POA: Diagnosis not present

## 2023-07-07 LAB — CBC WITH DIFFERENTIAL (CANCER CENTER ONLY)
Abs Immature Granulocytes: 0 10*3/uL (ref 0.00–0.07)
Basophils Absolute: 0 10*3/uL (ref 0.0–0.1)
Basophils Relative: 1 %
Eosinophils Absolute: 0.1 10*3/uL (ref 0.0–0.5)
Eosinophils Relative: 2 %
HCT: 41.3 % (ref 36.0–46.0)
Hemoglobin: 14.1 g/dL (ref 12.0–15.0)
Immature Granulocytes: 0 %
Lymphocytes Relative: 41 %
Lymphs Abs: 1.8 10*3/uL (ref 0.7–4.0)
MCH: 29.4 pg (ref 26.0–34.0)
MCHC: 34.1 g/dL (ref 30.0–36.0)
MCV: 86 fL (ref 80.0–100.0)
Monocytes Absolute: 0.5 10*3/uL (ref 0.1–1.0)
Monocytes Relative: 10 %
Neutro Abs: 2 10*3/uL (ref 1.7–7.7)
Neutrophils Relative %: 46 %
Platelet Count: 232 10*3/uL (ref 150–400)
RBC: 4.8 MIL/uL (ref 3.87–5.11)
RDW: 13.2 % (ref 11.5–15.5)
WBC Count: 4.5 10*3/uL (ref 4.0–10.5)
nRBC: 0 % (ref 0.0–0.2)

## 2023-07-07 LAB — CMP (CANCER CENTER ONLY)
ALT: 16 U/L (ref 0–44)
AST: 20 U/L (ref 15–41)
Albumin: 4.5 g/dL (ref 3.5–5.0)
Alkaline Phosphatase: 50 U/L (ref 38–126)
Anion gap: 6 (ref 5–15)
BUN: 18 mg/dL (ref 8–23)
CO2: 27 mmol/L (ref 22–32)
Calcium: 9.3 mg/dL (ref 8.9–10.3)
Chloride: 107 mmol/L (ref 98–111)
Creatinine: 0.94 mg/dL (ref 0.44–1.00)
GFR, Estimated: >60 mL/min (ref >60)
Glucose, Bld: 103 mg/dL — ABNORMAL HIGH (ref 70–99)
Potassium: 4.6 mmol/L (ref 3.5–5.1)
Sodium: 140 mmol/L (ref 135–145)
Total Bilirubin: 0.5 mg/dL (ref 0.0–1.2)
Total Protein: 6.9 g/dL (ref 6.5–8.1)

## 2023-07-07 MED ORDER — ZOLEDRONIC ACID 4 MG/100ML IV SOLN
4.0000 mg | Freq: Once | INTRAVENOUS | Status: AC
  Administered 2023-07-07: 4 mg via INTRAVENOUS
  Filled 2023-07-07: qty 100

## 2023-07-07 MED ORDER — SODIUM CHLORIDE 0.9 % IV SOLN: Freq: Once | INTRAVENOUS | Status: AC

## 2023-07-08 DIAGNOSIS — Z719 Counseling, unspecified: Secondary | ICD-10-CM

## 2023-07-12 DIAGNOSIS — C50211 Malignant neoplasm of upper-inner quadrant of right female breast: Secondary | ICD-10-CM | POA: Diagnosis not present

## 2023-07-13 ENCOUNTER — Encounter: Payer: Self-pay | Admitting: Hematology

## 2023-07-14 ENCOUNTER — Ambulatory Visit
Admission: RE | Admit: 2023-07-14 | Discharge: 2023-07-14 | Disposition: A | Discharge status: Home / Self Care | Source: Ambulatory Visit | Attending: Family Medicine | Admitting: Family Medicine

## 2023-07-14 VITALS — BP 138/82 | HR 89 | Temp 99.3°F | Resp 18 | Ht 64.0 in | Wt 140.0 lb

## 2023-07-14 DIAGNOSIS — J0100 Acute maxillary sinusitis, unspecified: Secondary | ICD-10-CM

## 2023-07-14 DIAGNOSIS — R0981 Nasal congestion: Secondary | ICD-10-CM | POA: Diagnosis not present

## 2023-07-14 MED ORDER — AMOXICILLIN-POT CLAVULANATE 875-125 MG PO TABS: 1.0000 | ORAL_TABLET | Freq: Two times a day (BID) | ORAL | 0 refills | Status: DC

## 2023-07-14 MED ORDER — PREDNISONE 20 MG PO TABS: ORAL_TABLET | ORAL | 0 refills | Status: DC

## 2023-07-14 NOTE — ED Triage Notes (Signed)
 Patient c/o sinus infection, cough, congestion x 2 days.  Afebrile.  Patient has taken OTC sinus medication.

## 2023-07-14 NOTE — ED Provider Notes (Signed)
 [facility]    CSN: [id number] Arrival date & time: 07/14/23  5638      History   Chief Complaint Chief Complaint  Patient presents with   Cough    Cough congestion sinus headache - Entered by patient    HPI Olivia LIMBURG is a 63 y.o. female.   HPI Very pleasant 63 year old female presents with sinus nasal congestion with sinus headache for 4 days.  PMH significant for breast cancer, HTN, and osteoporosis.  Past Medical History:  Diagnosis Date   Adjustment insomnia 02/23/2019   Breast cancer (HCC) 2018   Lumpectomy, chemotherapy and radiation with recurrence 2018. Now on Femara    Cervical radiculopathy 07/05/2019   COVID-19 02/2019   Her husband also passed away at this time from COVID-19.   Essential hypertension 08/10/2020   Family history of breast cancer    Fracture of vertebra due to osteoporosis (HCC) 11/27/2021   Genetic testing 10/30/2016   Breast/GYN panel (23 genes) @ [facility] - No pathogenic mutations detected   GERD (gastroesophageal reflux disease)    OTC medication   Grief reaction 02/23/2019   Heart murmur    as a child - Echo in early 20s "ok"   History of echocardiogram 05/2015   Echo 4/17: EF 55-60%, GLS -19.6%, normal wall motion   History of exercise stress test 05/2015   ETT 4/17 - No ST changes   HLD (hyperlipidemia)    Hypothyroidism    Mixed emotional features as adjustment reaction 11/09/2015   Multiple thyroid  nodules 2009   Multiple nodules: Normal ultrasound reported at diagnosis   Nondisplaced fracture of triquetral bone of right wrist with routine healing 11/27/2021   Osteoporosis    Personal history of chemotherapy    Personal history of radiation therapy    UNEQUAL LEG LENGTH, ACQUIRED 11/17/2006   Qualifier: Diagnosis of  By: Nolene Baumgarten MD, KARL     Vasovagal syncope 11/27/2021    Patient Active Problem List   Diagnosis Date Noted   On statin therapy 05/23/2021   Mixed hyperlipidemia 05/23/2021   Status post  bilateral breast reconstruction 06/10/2019   Arthritis 04/22/2018   Acquired absence of bilateral breasts and nipples 11/24/2016   Family history of breast cancer    Malignant neoplasm of upper-inner quadrant of right breast in female, estrogen receptor positive (HCC) 10/14/2016   Multiple thyroid  nodules 12/26/2014   Osteoporosis 12/26/2014    Past Surgical History:  Procedure Laterality Date   ABDOMINAL HYSTERECTOMY     BREAST BIOPSY Right 07/01/2022   US  RT BREAST BX W LOC DEV 1ST LESION IMG BX SPEC US  GUIDE 07/01/2022 [facility]   BREAST BIOPSY  08/15/2022   US  RT RADIOACTIVE SEED LOC 08/15/2022 [facility]   BREAST BIOPSY Right 10/02/2022   US  RT BREAST BX W LOC DEV 1ST LESION IMG BX SPEC US  GUIDE 10/02/2022 [facility]   BREAST LUMPECTOMY Right 2003   BREAST LUMPECTOMY WITH RADIOACTIVE SEED LOCALIZATION Right 08/18/2022   Procedure: RIGHT BREAST LUMPECTOMY WITH RADIOACTIVE SEED LOCALIZATION;  Surgeon: Caralyn Chandler, MD;  Location: [facility];  Service: General;  Laterality: Right;   BREAST RECONSTRUCTION WITH PLACEMENT OF TISSUE EXPANDER AND FLEX HD (ACELLULAR HYDRATED DERMIS) Bilateral 11/17/2016   Procedure: IMEDITATE BREAST RECONSTRUCTION WITH PLACEMENT OF TISSUE EXPANDER AND FLEX HD (ACELLULAR HYDRATED DERMIS);  Surgeon: Thornell Flirt, DO;  Location: [facility];  Service: Plastics;  Laterality: Bilateral;   BREAST SURGERY  2003 /right   lumpectomy; chemotherapy and  radiation   LIPOSUCTION WITH LIPOFILLING Bilateral 02/23/2020   Procedure: Fat filling of breasts for symmetry from abdomen;  Surgeon: Thornell Flirt, DO;  Location: [facility];  Service: Plastics;  Laterality: Bilateral;  90 min, please   LIPOSUCTION WITH LIPOFILLING Bilateral 05/16/2021   Procedure: LIPOSUCTION WITH LIPOFILLING;  Surgeon: Thornell Flirt, DO;  Location: [facility];  Service: Plastics;  Laterality: Bilateral;  1.5 hours    REMOVAL OF BILATERAL TISSUE EXPANDERS WITH PLACEMENT OF BILATERAL BREAST IMPLANTS Bilateral 02/11/2017   Procedure: REMOVAL OF BILATERAL TISSUE EXPANDERS WITH PLACEMENT OF BILATERAL SILICONE BREAST IMPLANTS;  Surgeon: Thornell Flirt, DO;  Location: [facility];  Service: Plastics;  Laterality: Bilateral;   TOTAL ABDOMINAL HYSTERECTOMY W/ BILATERAL SALPINGOOPHORECTOMY     for treatment of breast treatment   TOTAL MASTECTOMY Bilateral 11/17/2016   Procedure: RIGHT MASTECTOMY AND LEFT PROPHYLATIC MASTECTOMY;  Surgeon: Caralyn Chandler, MD;  Location: [facility];  Service: General;  Laterality: Bilateral;    OB History   No obstetric history on file.      Home Medications    Prior to Admission medications   Medication Sig Start Date End Date Taking? Authorizing Provider  amoxicillin -clavulanate (AUGMENTIN ) 875-125 MG tablet Take 1 tablet by mouth every 12 (twelve) hours. 07/14/23  Yes Leonides Ramp, FNP  atorvastatin  (LIPITOR) 20 MG tablet Take 1 tablet (20 mg total) by mouth daily. 05/27/23  Yes Kuneff, Renee A, DO  Calcium  500 MG CHEW Chew by mouth.   Yes [provider]  chlorpheniramine-HYDROcodone  (TUSSIONEX) 10-8 MG/5ML Take 5 mLs by mouth every 12 (twelve) hours as needed for cough. 06/05/23  Yes Kuneff, Renee A, DO  Collagenase POWD by Does not apply route.   Yes [provider]  diclofenac  (VOLTAREN ) 75 MG EC tablet Take 1 tablet (75 mg total) by mouth daily. 05/27/23  Yes Kuneff, Renee A, DO  ELDERBERRY PO Take by mouth.   Yes [provider]  predniSONE  (DELTASONE ) 20 MG tablet Take 3 tabs PO daily x 5 days. 07/14/23  Yes Leonides Ramp, FNP  psyllium (METAMUCIL) 58.6 % powder Take 1 packet by mouth daily.   Yes [provider]  tamoxifen  (NOLVADEX ) 20 MG tablet TAKE 1 TABLET BY MOUTH EVERY DAY 05/30/23  Yes Sonja Eagar, MD  Vitamin D , Cholecalciferol, 25 MCG (1000 UT) CAPS Take 1 capsule by mouth daily.   Yes [provider]     Family History Family History  Problem Relation Age of Onset   Coronary artery disease Mother        1ST DEGREE RELATIVE   Heart attack Mother 87   Skin cancer Mother        unk type   Diabetes Father    Kidney disease Father    Prostate cancer Father 64       deceased 36   Cancer Brother 56       multiple myeloma    Lung cancer Maternal Uncle        x2 mat uncles; dx 32s-70s   Breast cancer Paternal Aunt 38   Breast cancer Cousin        x2 pat female cousins; dx 47s   Esophageal cancer Cousin 21       pat female cousin   Diabetes Other        1ST DEGREE RELATIVE   Colon cancer Neg Hx    Colon polyps Neg Hx    Stomach cancer Neg Hx  Rectal cancer Neg Hx     Social History Social History   Tobacco Use   Smoking status: Never   Smokeless tobacco: Never  Vaping Use   Vaping status: Never Used  Substance Use Topics   Alcohol use: Yes    Alcohol/week: 0.0 standard drinks of alcohol    Comment: Rare   Drug use: No     Allergies   Patient has no known allergies.   Review of Systems Review of Systems  HENT:  Positive for congestion, sinus pressure and sinus pain.   All other systems reviewed and are negative.    Physical Exam Triage Vital Signs ED Triage Vitals  Encounter Vitals Group     BP 07/14/23 0842 138/82     Systolic BP Percentile --      Diastolic BP Percentile --      Pulse Rate 07/14/23 0842 89     Resp 07/14/23 0842 18     Temp 07/14/23 0842 99.3 F (37.4 C)     Temp Source 07/14/23 0842 Oral     SpO2 07/14/23 0842 97 %     Weight 07/14/23 0844 140 lb (63.5 kg)     Height 07/14/23 0844 5\' 4"  (1.626 m)     Head Circumference --      Peak Flow --      Pain Score 07/14/23 0843 5     Pain Loc --      Pain Education --      Exclude from Growth Chart --    No data found.  Updated Vital Signs BP 138/82 (BP Location: Left Arm)   Pulse 89   Temp 99.3 F (37.4 C) (Oral)   Resp 18   Ht 5\' 4"  (1.626 m)   Wt 140 lb (63.5 kg)   SpO2  97%   BMI 24.03 kg/m    Physical Exam Vitals and nursing note reviewed.  Constitutional:      Appearance: Normal appearance. She is normal weight. She is ill-appearing.  HENT:     Head: Normocephalic and atraumatic.     Right Ear: Tympanic membrane and external ear normal.     Left Ear: Tympanic membrane and external ear normal.     Ears:     Comments: Moderate eustachian tube dysfunction noted bilaterally    Nose:     Right Sinus: Maxillary sinus tenderness present.     Left Sinus: Maxillary sinus tenderness present.     Comments: Turbinates are erythematous/edematous    Mouth/Throat:     Mouth: Mucous membranes are moist.     Pharynx: Oropharynx is clear.     Comments: Moderate amount of clear drainage of posterior oropharynx noted Eyes:     Extraocular Movements: Extraocular movements intact.     Conjunctiva/sclera: Conjunctivae normal.     Pupils: Pupils are equal, round, and reactive to light.  Cardiovascular:     Rate and Rhythm: Normal rate and regular rhythm.     Pulses: Normal pulses.     Heart sounds: Normal heart sounds.  Pulmonary:     Effort: Pulmonary effort is normal.     Breath sounds: Normal breath sounds. No wheezing, rhonchi or rales.  Musculoskeletal:        General: Normal range of motion.     Cervical back: Normal range of motion and neck supple.  Skin:    General: Skin is warm and dry.  Neurological:     General: No focal deficit present.  Mental Status: She is alert and oriented to person, place, and time. Mental status is at baseline.  Psychiatric:        Mood and Affect: Mood normal.        Behavior: Behavior normal.      UC Treatments / Results  Labs (all labs ordered are listed, but only abnormal results are displayed) Labs Reviewed - No data to display  EKG   Radiology No results found.  Procedures Procedures (including critical care time)  Medications Ordered in UC Medications - No data to display  Initial Impression /  Assessment and Plan / UC Course  I have reviewed the triage vital signs and the nursing notes.  Pertinent labs & imaging results that were available during my care of the patient were reviewed by me and considered in my medical decision making (see chart for details).     MDM: 1.  Acute maxillary sinusitis, recurrence unspecified-Rx'd Augmentin  875/125 mg tablet: Take 1 tablet twice daily x 7 days; 2.  Nasal sinus congestion-Rx'd prednisone  20 mg tablet: Take 3 tablets p.o. daily x 5 days. Advised patient take medications as directed with food completion.  Advised take prednisone  with first dose of Augmentin  for the next 5 of 7 days.  Encouraged to increase daily water intake to 64 ounces per day while taking these medications.  Advised if symptoms worsen and/or unresolved please follow-up with your PCP or here for further evaluation.  Final Clinical Impressions(s) / UC Diagnoses   Final diagnoses:  Acute maxillary sinusitis, recurrence not specified  Nasal sinus congestion     Discharge Instructions      Advised patient take medications as directed with food completion.  Advised take prednisone  with first dose of Augmentin  for the next 5 of 7 days.  Encouraged to increase daily water intake to 64 ounces per day while taking these medications.  Advised if symptoms worsen and/or unresolved please follow-up with your PCP or here for further evaluation.   ED Prescriptions     Medication Sig Dispense Auth. Provider   amoxicillin -clavulanate (AUGMENTIN ) 875-125 MG tablet Take 1 tablet by mouth every 12 (twelve) hours. 14 tablet Dominga Mcduffie, FNP   predniSONE  (DELTASONE ) 20 MG tablet Take 3 tabs PO daily x 5 days. 15 tablet Uziel Covault, FNP      PDMP not reviewed this encounter.   Leonides Ramp, FNP 07/14/23 [phone]

## 2023-07-14 NOTE — Discharge Instructions (Addendum)
 Advised patient take medications as directed with food completion.  Advised take prednisone  with first dose of Augmentin  for the next 5 of 7 days.  Encouraged to increase daily water intake to 64 ounces per day while taking these medications.  Advised if symptoms worsen and/or unresolved please follow-up with your PCP or here for further evaluation.

## 2023-07-16 LAB — GUARDANT REVEAL

## 2023-07-27 ENCOUNTER — Encounter: Payer: Self-pay | Admitting: Hematology

## 2023-08-17 ENCOUNTER — Other Ambulatory Visit: Payer: Self-pay

## 2023-08-17 ENCOUNTER — Ambulatory Visit
Admission: RE | Admit: 2023-08-17 | Discharge: 2023-08-17 | Disposition: A | Discharge status: Home / Self Care | Source: Ambulatory Visit | Attending: Family Medicine | Admitting: Family Medicine

## 2023-08-17 VITALS — BP 158/87 | HR 81 | Temp 98.1°F | Resp 16

## 2023-08-17 DIAGNOSIS — S61411A Laceration without foreign body of right hand, initial encounter: Secondary | ICD-10-CM

## 2023-08-17 MED ORDER — CEPHALEXIN 500 MG PO CAPS: 500.0000 mg | ORAL_CAPSULE | Freq: Two times a day (BID) | ORAL | 0 refills | Status: DC

## 2023-08-17 NOTE — ED Notes (Signed)
 Laceration numbed with viscous lidocaine  on gauze per MD Maranda order

## 2023-08-17 NOTE — ED Provider Notes (Signed)
 [name]    CSN: [id number] Arrival date & time: 08/17/23  9071      History   Chief Complaint Chief Complaint  Patient presents with   Laceration    HPI Olivia [name] is a 63 y.o. female.   [place]  She has tried to keep it clean and put Neosporin on it.  She is concerned for possible infection.  The area is swollen and bruised.    Past Medical History:  Diagnosis Date   Adjustment insomnia 02/23/2019   Breast cancer (HCC) 2018   Lumpectomy, chemotherapy and radiation with recurrence 2018. Now on Femara    Cervical radiculopathy 07/05/2019   COVID-19 02/2019   Her husband also passed away at this time from COVID-19.   Essential hypertension 08/10/2020   Family history of breast cancer    Fracture of vertebra due to osteoporosis (HCC) 11/27/2021   Genetic testing 10/30/2016   Breast/GYN panel (23 genes) @ [facility] - No pathogenic mutations detected   GERD (gastroesophageal reflux disease)    OTC medication   Grief reaction 02/23/2019   Heart murmur    as a child - Echo in early 20s ok   History of echocardiogram 05/2015   Echo 4/17: EF 55-60%, GLS -19.6%, normal wall motion   History of exercise stress test 05/2015   ETT 4/17 - No ST changes   HLD (hyperlipidemia)    Hypothyroidism    Mixed emotional features as adjustment reaction 11/09/2015   Multiple thyroid  nodules 2009   Multiple nodules: Normal ultrasound reported at diagnosis   Nondisplaced fracture of triquetral bone of right wrist with routine healing 11/27/2021   Osteoporosis    Personal history of chemotherapy    Personal history of radiation therapy    UNEQUAL LEG LENGTH, ACQUIRED 11/17/2006   Qualifier: Diagnosis of  By: HARVEY MD, KARL     Vasovagal syncope 11/27/2021    Patient Active Problem List   Diagnosis Date Noted   On statin therapy 05/23/2021   Mixed hyperlipidemia 05/23/2021   Status post bilateral breast  reconstruction 06/10/2019   Arthritis 04/22/2018   Acquired absence of bilateral breasts and nipples 11/24/2016   Family history of breast cancer    Malignant neoplasm of upper-inner quadrant of right breast in female, estrogen receptor positive (HCC) 10/14/2016   Multiple thyroid  nodules 12/26/2014   Osteoporosis 12/26/2014    Past Surgical History:  Procedure Laterality Date   ABDOMINAL HYSTERECTOMY     BREAST BIOPSY Right 07/01/2022   US  RT BREAST BX W LOC DEV 1ST LESION IMG BX SPEC US  GUIDE 07/01/2022 [facility]   BREAST BIOPSY  08/15/2022   US  RT RADIOACTIVE SEED LOC 08/15/2022 [facility]   BREAST BIOPSY Right 10/02/2022   US  RT BREAST BX W LOC DEV 1ST LESION IMG BX SPEC US  GUIDE 10/02/2022 [facility]   BREAST LUMPECTOMY Right 2003   BREAST LUMPECTOMY WITH RADIOACTIVE SEED LOCALIZATION Right 08/18/2022   Procedure: RIGHT BREAST LUMPECTOMY WITH RADIOACTIVE SEED LOCALIZATION;  Surgeon: Curvin Deward MOULD, MD;  Location: [facility];  Service: General;  Laterality: Right;   BREAST RECONSTRUCTION WITH PLACEMENT OF TISSUE EXPANDER AND FLEX HD (ACELLULAR HYDRATED DERMIS) Bilateral 11/17/2016   Procedure: IMEDITATE BREAST RECONSTRUCTION WITH PLACEMENT OF TISSUE EXPANDER AND FLEX HD (ACELLULAR HYDRATED DERMIS);  Surgeon: Lowery Estefana RAMAN, DO;  Location: [facility];  Service: Plastics;  Laterality: Bilateral;   BREAST SURGERY  2003 [facility]  lumpectomy; chemotherapy and radiation   LIPOSUCTION WITH LIPOFILLING Bilateral 02/23/2020   Procedure: Fat filling of breasts for symmetry from abdomen;  Surgeon: Lowery Estefana RAMAN, DO;  Location: [facility];  Service: Plastics;  Laterality: Bilateral;  90 min, please   LIPOSUCTION WITH LIPOFILLING Bilateral 05/16/2021   Procedure: LIPOSUCTION WITH LIPOFILLING;  Surgeon: Lowery Estefana RAMAN, DO;  Location: [facility];  Service: Plastics;  Laterality: Bilateral;  1.5 hours   REMOVAL OF BILATERAL  TISSUE EXPANDERS WITH PLACEMENT OF BILATERAL BREAST IMPLANTS Bilateral 02/11/2017   Procedure: REMOVAL OF BILATERAL TISSUE EXPANDERS WITH PLACEMENT OF BILATERAL SILICONE BREAST IMPLANTS;  Surgeon: Lowery Estefana RAMAN, DO;  Location: [facility];  Service: Plastics;  Laterality: Bilateral;   TOTAL ABDOMINAL HYSTERECTOMY W/ BILATERAL SALPINGOOPHORECTOMY     for treatment of breast treatment   TOTAL MASTECTOMY Bilateral 11/17/2016   Procedure: RIGHT MASTECTOMY AND LEFT PROPHYLATIC MASTECTOMY;  Surgeon: Curvin Deward MOULD, MD;  Location: [facility];  Service: General;  Laterality: Bilateral;    OB History   No obstetric history on file.      Home Medications    Prior to Admission medications   Medication Sig Start Date End Date Taking? Authorizing Provider  cephALEXin  (KEFLEX ) 500 MG capsule Take 1 capsule (500 mg total) by mouth 2 (two) times daily. 08/17/23  Yes Maranda Jamee Jacob, MD  atorvastatin  (LIPITOR) 20 MG tablet Take 1 tablet (20 mg total) by mouth daily. 05/27/23   Kuneff, Renee A, DO  Calcium  500 MG CHEW Chew by mouth.    [provider]  Collagenase POWD by Does not apply route.    [provider]  diclofenac  (VOLTAREN ) 75 MG EC tablet Take 1 tablet (75 mg total) by mouth daily. 05/27/23   Kuneff, Renee A, DO  ELDERBERRY PO Take by mouth.    [provider]  psyllium (METAMUCIL) 58.6 % powder Take 1 packet by mouth daily.    [provider]  tamoxifen  (NOLVADEX ) 20 MG tablet TAKE 1 TABLET BY MOUTH EVERY DAY 05/30/23   Lanny Callander, MD  Vitamin D , Cholecalciferol, 25 MCG (1000 UT) CAPS Take 1 capsule by mouth daily.    [provider]    Family History Family History  Problem Relation Age of Onset   Coronary artery disease Mother        1ST DEGREE RELATIVE   Heart attack Mother 32   Skin cancer Mother        unk type   Diabetes Father    Kidney disease Father    Prostate cancer Father 29       deceased 60   Cancer Brother 74        multiple myeloma    Lung cancer Maternal Uncle        x2 mat uncles; dx 68s-70s   Breast cancer Paternal Aunt 40   Breast cancer Cousin        x2 pat female cousins; dx 40s   Esophageal cancer Cousin 40       pat female cousin   Diabetes Other        1ST DEGREE RELATIVE   Colon cancer Neg Hx    Colon polyps Neg Hx    Stomach cancer Neg Hx    Rectal cancer Neg Hx     Social History Social History   Tobacco Use   Smoking status: Never   Smokeless tobacco: Never  Vaping Use   Vaping status: Never Used  Substance Use Topics   Alcohol use: Not Currently    Comment: Rare   Drug use: No     Allergies   Patient has no known allergies.   Review of Systems Review of Systems See HPI  Physical Exam Triage Vital Signs ED Triage Vitals  Encounter Vitals Group     BP 08/17/23 0931 (!) 158/87     Girls Systolic BP Percentile --      Girls Diastolic BP Percentile --      Boys Systolic BP Percentile --      Boys Diastolic BP Percentile --      Pulse Rate 08/17/23 0931 81     Resp 08/17/23 0931 16     Temp 08/17/23 0931 98.1 F (36.7 C)     Temp src --      SpO2 08/17/23 0931 95 %     Weight --      Height --      Head Circumference --      Peak Flow --      Pain Score 08/17/23 0934 6     Pain Loc --      Pain Education --      Exclude from Growth Chart --    No data found.  Updated Vital Signs BP (!) 158/87   Pulse 81   Temp 98.1 F (36.7 C)   Resp 16   SpO2 95%   Visual Acuity Right Eye Distance:   Left Eye Distance:   Bilateral Distance:    Right Eye Near:   Left Eye Near:    Bilateral Near:     Physical Exam Constitutional:      General: She is not in acute distress.    Appearance: She is well-developed.  HENT:     Head: Normocephalic and atraumatic.  Eyes:     Conjunctiva/sclera: Conjunctivae normal.     Pupils: Pupils are equal, round, and reactive to light.  Cardiovascular:     Rate and Rhythm: Normal rate.  Pulmonary:     Effort:  Pulmonary effort is normal. No respiratory distress.  Abdominal:     General: There is no distension.     Palpations: Abdomen is soft.  Musculoskeletal:        General: Normal range of motion.     Cervical back: Normal range of motion.  Skin:    General: Skin is warm and dry.     Comments: Dorsum of right hand has a macerated irregular laceration that measures 4 cm in length.  Neurological:     Mental Status: She is alert.      UC Treatments / Results  Labs (all labs ordered are listed, but only abnormal results are displayed) Labs Reviewed - No data to display  EKG   Radiology No results found.  Procedures Procedures  With patient's permission the area is cleansed.  Benzoin applied and Steri-Strips.  Medications Ordered in UC Medications - No data to display  Initial Impression / Assessment and Plan / UC Course  I have reviewed the triage vital signs and the nursing notes.  Pertinent labs & imaging results that were available during my care of the patient were reviewed by me and considered in my medical decision making (see chart for details).    I explained to the patient spent too long to suture it.  In addition [place] and more than 12 hours wait there is risk of infection.  Will place her on antibiotics  and try to minimize the scarring with Steri-Strips. Final Clinical Impressions(s) / UC Diagnoses   Final diagnoses:  Laceration of right hand without foreign body, initial encounter     Discharge Instructions      Watch area for signs of infection I am placing you on an antibiotic 2 times a day for 5 days Try to protect the tapes.  They can become briefly wet but not soak.  You may soak them off in 5 to 7 days Return if needed   ED Prescriptions     Medication Sig Dispense Auth. Provider   cephALEXin  (KEFLEX ) 500 MG capsule Take 1 capsule (500 mg total) by mouth 2 (two) times daily. 10 capsule Maranda Jamee Jacob, MD      PDMP not  reviewed this encounter.   Maranda Jamee Jacob, MD 08/17/23 [phone]

## 2023-08-17 NOTE — Discharge Instructions (Signed)
 Watch area for signs of infection I am placing you on an antibiotic 2 times a day for 5 days Try to protect the tapes.  They can become briefly wet but not soak.  You may soak them off in 5 to 7 days Return if needed

## 2023-08-17 NOTE — ED Triage Notes (Addendum)
 Laceration to right hand while tubing last night. Sts last tetanus within last 10 years. Per chart 05/23/2021 last tdap

## 2023-09-02 DIAGNOSIS — Z6825 Body mass index (BMI) 25.0-25.9, adult: Secondary | ICD-10-CM | POA: Diagnosis not present

## 2023-09-02 DIAGNOSIS — Z01419 Encounter for gynecological examination (general) (routine) without abnormal findings: Secondary | ICD-10-CM | POA: Diagnosis not present

## 2023-10-12 ENCOUNTER — Inpatient Hospital Stay

## 2023-10-12 ENCOUNTER — Inpatient Hospital Stay: Attending: Nurse Practitioner | Admitting: Hematology

## 2023-10-12 VITALS — BP 138/88 | HR 87 | Temp 97.9°F | Resp 20 | Ht 64.0 in | Wt 154.0 lb

## 2023-10-12 DIAGNOSIS — Z9221 Personal history of antineoplastic chemotherapy: Secondary | ICD-10-CM | POA: Insufficient documentation

## 2023-10-12 DIAGNOSIS — Z79810 Long term (current) use of selective estrogen receptor modulators (SERMs): Secondary | ICD-10-CM | POA: Diagnosis not present

## 2023-10-12 DIAGNOSIS — Z9013 Acquired absence of bilateral breasts and nipples: Secondary | ICD-10-CM | POA: Insufficient documentation

## 2023-10-12 DIAGNOSIS — Z170 Estrogen receptor positive status [ER+]: Secondary | ICD-10-CM

## 2023-10-12 DIAGNOSIS — Z923 Personal history of irradiation: Secondary | ICD-10-CM | POA: Insufficient documentation

## 2023-10-12 DIAGNOSIS — C50211 Malignant neoplasm of upper-inner quadrant of right female breast: Secondary | ICD-10-CM | POA: Insufficient documentation

## 2023-10-12 DIAGNOSIS — Z9882 Breast implant status: Secondary | ICD-10-CM | POA: Insufficient documentation

## 2023-10-12 LAB — CBC WITH DIFFERENTIAL (CANCER CENTER ONLY)
Abs Immature Granulocytes: 0.01 K/uL (ref 0.00–0.07)
Basophils Absolute: 0 K/uL (ref 0.0–0.1)
Basophils Relative: 0 %
Eosinophils Absolute: 0 K/uL (ref 0.0–0.5)
Eosinophils Relative: 1 %
HCT: 39.8 % (ref 36.0–46.0)
Hemoglobin: 13.5 g/dL (ref 12.0–15.0)
Immature Granulocytes: 0 %
Lymphocytes Relative: 22 %
Lymphs Abs: 1.1 K/uL (ref 0.7–4.0)
MCH: 29.8 pg (ref 26.0–34.0)
MCHC: 33.9 g/dL (ref 30.0–36.0)
MCV: 87.9 fL (ref 80.0–100.0)
Monocytes Absolute: 0.4 K/uL (ref 0.1–1.0)
Monocytes Relative: 7 %
Neutro Abs: 3.8 K/uL (ref 1.7–7.7)
Neutrophils Relative %: 70 %
Platelet Count: 206 K/uL (ref 150–400)
RBC: 4.53 MIL/uL (ref 3.87–5.11)
RDW: 13.2 % (ref 11.5–15.5)
WBC Count: 5.3 K/uL (ref 4.0–10.5)
nRBC: 0 % (ref 0.0–0.2)

## 2023-10-12 LAB — CMP (CANCER CENTER ONLY)
ALT: 22 U/L (ref 0–44)
AST: 25 U/L (ref 15–41)
Albumin: 4.4 g/dL (ref 3.5–5.0)
Alkaline Phosphatase: 43 U/L (ref 38–126)
Anion gap: 5 (ref 5–15)
BUN: 18 mg/dL (ref 8–23)
CO2: 28 mmol/L (ref 22–32)
Calcium: 9.3 mg/dL (ref 8.9–10.3)
Chloride: 109 mmol/L (ref 98–111)
Creatinine: 0.94 mg/dL (ref 0.44–1.00)
GFR, Estimated: >60 mL/min (ref >60)
Glucose, Bld: 106 mg/dL — ABNORMAL HIGH (ref 70–99)
Potassium: 3.9 mmol/L (ref 3.5–5.1)
Sodium: 142 mmol/L (ref 135–145)
Total Bilirubin: 0.5 mg/dL (ref 0.0–1.2)
Total Protein: 6.5 g/dL (ref 6.5–8.1)

## 2023-10-12 NOTE — Progress Notes (Signed)
 [facility]   [phone] Fax:([phone]) [phone]   Clinic Follow up Note   Patient Care Team: Catherine Charlies LABOR, DO as PCP - General (Family Medicine) Lanny Callander, MD as Consulting Physician (Hematology) Curvin Deward MOULD, MD as Consulting Physician (General Surgery) Shannon Agent, MD as Consulting Physician (Radiation Oncology) Jakie Alm SAUNDERS, MD as Consulting Physician (Gastroenterology) Johnnye Ade, MD as Consulting Physician (Obstetrics and Gynecology) Crawford, Morna Pickle, NP as Nurse Practitioner (Hematology and Oncology) Burton, Lacie K, NP as Nurse Practitioner (Nurse Practitioner)  Date of Service:  10/12/2023  CHIEF COMPLAINT: f/u of right breast cancer  CURRENT THERAPY:  Adjuvant tamoxifen   Oncology History   Malignant neoplasm of upper-inner quadrant of right breast in female, estrogen receptor positive (HCC) invasive ductal carcinoma, pT2N0, M0), Stage 1B, ER/PR: positive, HER2 negative, Grade 3, Oncotype RS 21, recurrence in 07/2022 -diagnosed 10/2016, s/p bilateral mastectomy and reconstruction. Based on oncotype RS she did not need adjuvant chemotherapy. And given node-negative mastectomy she did not require adjuvant radiation -started adjuvant letrozole  12/2016. She was changed to exemestane  11/2019 due to arthralgia.  Planned for a total of 7 years but stopped 12/2021 due to worsening bone density.  -Recent US /bx showed recurrent vs new R breast IDC, G2, ER/PR strongly positive, HER2 negative, with Ki67 20%.  Staging CT and bone scan showed no definitive evidence of metastasis. -She underwent right lumpectomy on August 18, 2022, no residual malignant cell was seen on surgical path.  Her initial biopsy showed 1.5 mm invasive cancer, the cancer was removed completely by biopsy. -Given the small size of invasive cancer, no benefit of adjuvant chemotherapy. -She previously received a total of 5 years of AI.  She stopped in November 2023 due to  osteoprosis -I recommended 5 years of tamoxifen , she started in 08/2022 -She completed adjuvant RT on 11/26/2022   Assessment & Plan Breast cancer status post bilateral mastectomy on tamoxifen  Breast cancer status post bilateral mastectomy with no current discomfort or pain. No issues with shoulder movement. Tolerating tamoxifen  well with no hot flashes or other side effects. Blood counts are normal, awaiting kidney and liver function tests. No need for radiation therapy this fall. Reconstruction appears satisfactory with no scar tissue or tenderness. - Continue tamoxifen  therapy - Schedule follow-up appointment every six months - Review blood work results when available [facility] to contact office for prescription refills if needed   Plan - She is clinically doing well, and tolerating tamoxifen  well well, will continue - Lab and follow-up in 6 months  SUMMARY OF ONCOLOGIC HISTORY: Oncology History Overview Note  Cancer Staging Malignant neoplasm of upper-inner quadrant of right breast in female, estrogen receptor positive (HCC) Staging form: Breast, AJCC 8th Edition - Clinical stage from 10/09/2016: Stage IA (cT1c, cN0, cM0, G3, ER: Positive, PR: Positive, HER2: Negative) - Signed by Lanny Callander, MD on 10/14/2016 - Pathologic stage from 11/17/2016: Stage IB (pT2, pN0, cM0, G3, ER: Positive, PR: Positive, HER2: Negative, Oncotype DX score: 21) - Signed by Lanny Callander, MD on 12/11/2016     Malignant neoplasm of upper-inner quadrant of right breast in female, estrogen receptor positive (HCC)  10/08/2016 Mammogram   Diagnostic mammogram and ultrasound showed an irregular mass in the right breast at 1 o'clock, 5 cm from the nipple measuring 1.6 x 1.4x 1.6 cm, axilla was negative for adenopathy.   10/09/2016 Receptors her2   Estrogen Receptor: 80%, POSITIVE, STRONG STAINING INTENSITY Progesterone Receptor: 30%, POSITIVE, STRONG STAINING INTENSITY Proliferation Marker Ki67:  35% HER2  NEGATIVE    10/09/2016 Initial Diagnosis   Malignant neoplasm of upper-inner quadrant of right breast in female, estrogen receptor positive (HCC)   10/09/2016 Initial Biopsy   Diagnosis Breast, right, needle core biopsy, upper inner quadrant, 1:00 o'clock - INVASIVE DUCTAL CARCINOMA, G3   10/2016 Genetic Testing   Genetic testing was negative for The genes analyzed were the 23 genes on [facility]'s Breast/GYN panel (ATM, BARD1, BRCA1, BRCA2, BRIP1, CDH1, CHEK2, DICER1, EPCAM, MLH1,  MSH2, MSH6, NBN, NF1, PALB2, PMS2, PTEN, RAD50, RAD51C, RAD51D,SMARCA4, STK11, and TP53).     11/17/2016 Surgery   RIGHT MASTECTOMY AND LEFT PROPHYLATIC MASTECTOMY by Dr. Curvin  IMEDITATE BREAST RECONSTRUCTION WITH PLACEMENT OF TISSUE EXPANDER AND FLEX HD (ACELLULAR HYDRATED DERMIS) by Dr. Lowery     11/17/2016 Pathology Results   Diagnosis 11/17/16 1. Breast, simple mastectomy, Left - BENIGN BREAST TISSUE WITH ECTATIC DUCTS. - ONE OF ONE LYMPH NODE NEGATIVE FOR CARCINOMA (0/1). 2. Breast, simple mastectomy, Right - INVASIVE DUCTAL CARCINOMA, GRADE 3, SPANNING 2.4 CM. - HIGH GRADE DUCTAL CARCINOMA IN SITU. - INVASIVE CARCINOMA IS <0.1 CM OF THE POSTERIOR MARGIN (BROADLY) AND ANTERIOR MARGIN (FOCALLY). - IN SITU CARCINOMA IS >0.2 CM OF BOTH MARGINS. - PRIOR LUMPECTOMY SITE, NEGATIVE FOR CARCINOMA. - SEE ONCOLOGY TABLE. 3. Breast, excision, Superior flap deep margin - BENIGN FIBROADIPOSE TISSUE.   11/17/2016 Miscellaneous   Oncotype on 11/17/16 Rcurence Risk Score of 21 10-Year risk of distant reucrrence with Tamoxifen  alone is 13%    12/18/2016 - 12/30/2021 Anti-estrogen oral therapy   Adjuvant Letrozole  2.5 mg daily; began 12/18/16. Due to arthritis I switched her to Exemestane  25mg  daily in 11/2019.    12/24/2016 Imaging   BONE SCAN IMPRESSION: No evidence of osseous metastatic disease.   12/24/2016 Imaging   CT CAP IMPRESSION: 1. Status post bilateral modified radical mastectomy and  breast reconstruction, as above. No findings to suggest metastatic disease in the chest, abdomen or pelvis. 2. Multiple heterogeneous appearing thyroid  nodules, as discussed above. These are nonspecific but further evaluation with nonemergent thyroid  ultrasound is recommended in the near future to determine any potential need for further evaluation with fine-needle aspiration. 3. Incidental findings, as above.   02/11/2017 Surgery   REMOVAL OF BILATERAL TISSUE EXPANDERS WITH PLACEMENT OF BILATERAL SILICONE BREAST IMPLANTS by Dr. Lowery    06/19/2022 Imaging   R AX US :  Targeted ultrasound is performed, showing a 5 x 2 x 2 mm irregular hypoechoic mass right breast 1 o'clock position 3 cm from nipple at the site of palpable concern. No right axillary adenopathy.   IMPRESSION: Indeterminate palpable mass right breast 1 o'clock position.   07/01/2022 Pathology Results   Recurrence/new   Breast, right, needle core biopsy, 1 o'clock, 3cmfn, hydromark clip INVASIVE DUCTAL CARCINOMA, RECURRENT OVERALL GRADE: 2  PROGNOSTIC INDICATORS The tumor cells are EQUIVOCAL for Her2 (2+).  Estrogen Receptor: 100%, POSITIVE, STRONG STAINING INTENSITY Progesterone Receptor: 100%, POSITIVE, STRONG STAINING INTENSITY Proliferation Marker Ki67: 20%  1A. Breast, righnt,needle core biopsy, 1 o'clock, 3 cmfn FLUORESCENCE IN-SITU HYBRIDIZATION Results: GROUP 5: HER2 **NEGATIVE**     08/18/2022 Definitive Surgery   A. BREAST, RIGHT, LUMPECTOMY:  - Skin with underlying benign fibroadipose tissue  - No evidence of malignancy   B. BREAST, RIGHT LATERAL MARGIN, EXCISION:  - Benign skeletal muscle and fibroadipose tissue  - No evidence of malignancy    10/02/2022 Pathology Results   Breast, right, needle core biopsy, upper inner, 1 o'clock, 3cmfn BENIGN BREAST TISSUE WITH  FAT NECROSIS, GIANT CELL REACTIONS AND CYST WALL. NEGATIVE FOR ATYPIA OR MALIGNANCY.   10/13/2022 - 11/26/2022 Radiation Therapy    Adjuvant R breast radiation per Dr. Izell   11/12/2022 Genetic Testing   Negative [facility] CancerNext-Expanded +RNAinsight Panel.  Report date is 11/12/2022.   The CancerNext-Expanded gene panel offered by [facility] and includes sequencing, rearrangement, and RNA analysis for the following 71 genes:  AIP, ALK, APC, ATM, BAP1, BARD1, BMPR1A, BRCA1, BRCA2, BRIP1, CDC73, CDH1, CDK4, CDKN1B, CDKN2A, CHEK2, DICER1, FH, FLCN, KIF1B, LZTR1,MAX, MEN1, MET, MLH1, MSH2, MSH6, MUTYH, NF1, NF2, NTHL1, PALB2, PHOX2B, PMS2, POT1, PRKAR1A, PTCH1, PTEN, RAD51C,RAD51D, RB1, RET, SDHA, SDHAF2, SDHB, SDHC, SDHD, SMAD4, SMARCA4, SMARCB1, SMARCE1, STK11, SUFU, TMEM127, TP53,TSC1, TSC2 and VHL (sequencing and deletion/duplication); AXIN2, CTNNA1, EGFR, EGLN1, HOXB13, KIT, MITF, MSH3, PDGFRA, POLD1 and POLE (sequencing only); EPCAM and GREM1 (deletion/duplication only).    11/2022 -  Anti-estrogen oral therapy   Adjuvant Tamoxifen , starting 11/2022, goal 5 years   12/16/2022 Survivorship   SCP delivered by Lacie Burton, NP      Discussed the use of AI scribe software for clinical note transcription with the patient, who gave verbal consent to proceed.  History of Present Illness Olivia [name] is a 63 year old female with breast cancer who presents for follow-up.  She underwent a double mastectomy over a year ago and has been on tamoxifen  since July of last year, with good tolerance and fewer side effects than previously experienced. She is not undergoing radiation therapy this fall. She reports no current concerns, pain, or discomfort, and no breast discomfort, shoulder movement issues, or hot flashes. She engages in regular physical activity, including paddleboarding and walking, without issues in shoulder movement, though she occasionally experiences a cramp on the opposite side when raising her arm at the gym.     All other systems were reviewed with the patient and are negative.  MEDICAL HISTORY:  Past  Medical History:  Diagnosis Date   Adjustment insomnia 02/23/2019   Breast cancer (HCC) 2018   Lumpectomy, chemotherapy and radiation with recurrence 2018. Now on Femara    Cervical radiculopathy 07/05/2019   COVID-19 02/2019   Her husband also passed away at this time from COVID-19.   Essential hypertension 08/10/2020   Family history of breast cancer    Fracture of vertebra due to osteoporosis (HCC) 11/27/2021   Genetic testing 10/30/2016   Breast/GYN panel (23 genes) @ [facility] - No pathogenic mutations detected   GERD (gastroesophageal reflux disease)    OTC medication   Grief reaction 02/23/2019   Heart murmur    as a child - Echo in early 20s ok   History of echocardiogram 05/2015   Echo 4/17: EF 55-60%, GLS -19.6%, normal wall motion   History of exercise stress test 05/2015   ETT 4/17 - No ST changes   HLD (hyperlipidemia)    Hypothyroidism    Mixed emotional features as adjustment reaction 11/09/2015   Multiple thyroid  nodules 2009   Multiple nodules: Normal ultrasound reported at diagnosis   Nondisplaced fracture of triquetral bone of right wrist with routine healing 11/27/2021   Osteoporosis    Personal history of chemotherapy    Personal history of radiation therapy    UNEQUAL LEG LENGTH, ACQUIRED 11/17/2006   Qualifier: Diagnosis of  By: HARVEY MD, KARL     Vasovagal syncope 11/27/2021    SURGICAL HISTORY: Past Surgical History:  Procedure Laterality Date   ABDOMINAL HYSTERECTOMY     BREAST BIOPSY  Right 07/01/2022   US  RT BREAST BX W LOC DEV 1ST LESION IMG BX SPEC US  GUIDE 07/01/2022 [facility]   BREAST BIOPSY  08/15/2022   US  RT RADIOACTIVE SEED LOC 08/15/2022 [facility]   BREAST BIOPSY Right 10/02/2022   US  RT BREAST BX W LOC DEV 1ST LESION IMG BX SPEC US  GUIDE 10/02/2022 [facility]   BREAST LUMPECTOMY Right 2003   BREAST LUMPECTOMY WITH RADIOACTIVE SEED LOCALIZATION Right 08/18/2022   Procedure: RIGHT BREAST LUMPECTOMY WITH RADIOACTIVE  SEED LOCALIZATION;  Surgeon: Curvin Deward MOULD, MD;  Location: [facility];  Service: General;  Laterality: Right;   BREAST RECONSTRUCTION WITH PLACEMENT OF TISSUE EXPANDER AND FLEX HD (ACELLULAR HYDRATED DERMIS) Bilateral 11/17/2016   Procedure: IMEDITATE BREAST RECONSTRUCTION WITH PLACEMENT OF TISSUE EXPANDER AND FLEX HD (ACELLULAR HYDRATED DERMIS);  Surgeon: Lowery Estefana RAMAN, DO;  Location: [facility];  Service: Plastics;  Laterality: Bilateral;   BREAST SURGERY  2003 /right   lumpectomy; chemotherapy and radiation   LIPOSUCTION WITH LIPOFILLING Bilateral 02/23/2020   Procedure: Fat filling of breasts for symmetry from abdomen;  Surgeon: Lowery Estefana RAMAN, DO;  Location: [facility];  Service: Plastics;  Laterality: Bilateral;  90 min, please   LIPOSUCTION WITH LIPOFILLING Bilateral 05/16/2021   Procedure: LIPOSUCTION WITH LIPOFILLING;  Surgeon: Lowery Estefana RAMAN, DO;  Location: [facility];  Service: Plastics;  Laterality: Bilateral;  1.5 hours   REMOVAL OF BILATERAL TISSUE EXPANDERS WITH PLACEMENT OF BILATERAL BREAST IMPLANTS Bilateral 02/11/2017   Procedure: REMOVAL OF BILATERAL TISSUE EXPANDERS WITH PLACEMENT OF BILATERAL SILICONE BREAST IMPLANTS;  Surgeon: Lowery Estefana RAMAN, DO;  Location: [facility];  Service: Plastics;  Laterality: Bilateral;   TOTAL ABDOMINAL HYSTERECTOMY W/ BILATERAL SALPINGOOPHORECTOMY     for treatment of breast treatment   TOTAL MASTECTOMY Bilateral 11/17/2016   Procedure: RIGHT MASTECTOMY AND LEFT PROPHYLATIC MASTECTOMY;  Surgeon: Curvin Deward MOULD, MD;  Location: [facility];  Service: General;  Laterality: Bilateral;    I have reviewed the social history and family history with the patient and they are unchanged from previous note.  ALLERGIES:  has no known allergies.  MEDICATIONS:  Current Outpatient Medications  Medication Sig Dispense Refill   atorvastatin  (LIPITOR) 20 MG tablet Take 1 tablet (20 mg total)  by mouth daily. 90 tablet 3   Calcium  500 MG CHEW Chew by mouth.     Collagenase POWD by Does not apply route.     diclofenac  (VOLTAREN ) 75 MG EC tablet Take 1 tablet (75 mg total) by mouth daily. 90 tablet 3   ELDERBERRY PO Take by mouth.     psyllium (METAMUCIL) 58.6 % powder Take 1 packet by mouth daily.     tamoxifen  (NOLVADEX ) 20 MG tablet TAKE 1 TABLET BY MOUTH EVERY DAY 90 tablet 1   Vitamin D , Cholecalciferol, 25 MCG (1000 UT) CAPS Take 1 capsule by mouth daily.     cephALEXin  (KEFLEX ) 500 MG capsule Take 1 capsule (500 mg total) by mouth 2 (two) times daily. (Patient not taking: Reported on 10/12/2023) 10 capsule 0   No current facility-administered medications for this visit.    PHYSICAL EXAMINATION: ECOG PERFORMANCE STATUS: 0 - Asymptomatic  Vitals:   10/12/23 1053  BP: 138/88  Pulse: 87  Resp: 20  Temp: 97.9 F (36.6 C)  SpO2: 97%   Wt Readings from Last 3 Encounters:  10/12/23 154 lb (69.9 kg)  07/14/23 140 lb (63.5 kg)  07/07/23 147 lb 8 oz (66.9  kg)     GENERAL:alert, no distress and comfortable SKIN: skin color, texture, turgor are normal, no rashes or significant lesions EYES: normal, Conjunctiva are pink and non-injected, sclera clear NECK: supple, thyroid  normal size, non-tender, without nodularity LYMPH:  no palpable lymphadenopathy in the cervical, axillary  LUNGS: clear to auscultation and percussion with normal breathing effort HEART: regular rate & rhythm and no murmurs and no lower extremity edema ABDOMEN:abdomen soft, non-tender and normal bowel sounds Musculoskeletal:no cyanosis of digits and no clubbing  NEURO: alert & oriented x 3 with fluent speech, no focal motor/sensory deficits BREAST: Status post bilateral mastectomy and reconstruction.  Breast reconstruction appears normal.  No palpable mass or adenopathy. Physical Exam   LABORATORY DATA:  I have reviewed the data as listed    Latest Ref Rng & Units 10/12/2023   10:47 AM 07/07/2023     7:48 AM 04/07/2023   10:38 AM  CBC  WBC 4.0 - 10.5 K/uL 5.3  4.5  5.4   Hemoglobin 12.0 - 15.0 g/dL 86.4  85.8  85.8   Hematocrit 36.0 - 46.0 % 39.8  41.3  42.5   Platelets 150 - 400 K/uL 206  232  240         Latest Ref Rng & Units 10/12/2023   10:47 AM 07/07/2023    7:48 AM 04/07/2023   10:38 AM  CMP  Glucose 70 - 99 mg/dL 893  896  878   BUN 8 - 23 mg/dL 18  18  16    Creatinine 0.44 - 1.00 mg/dL 9.05  9.05  9.00   Sodium 135 - 145 mmol/L 142  140  142   Potassium 3.5 - 5.1 mmol/L 3.9  4.6  5.1   Chloride 98 - 111 mmol/L 109  107  106   CO2 22 - 32 mmol/L 28  27  30    Calcium  8.9 - 10.3 mg/dL 9.3  9.3  89.8   Total Protein 6.5 - 8.1 g/dL 6.5  6.9  6.8   Total Bilirubin 0.0 - 1.2 mg/dL 0.5  0.5  0.6   Alkaline Phos 38 - 126 U/L 43  50  40   AST 15 - 41 U/L 25  20  24    ALT 0 - 44 U/L 22  16  19        RADIOGRAPHIC STUDIES: I have personally reviewed the radiological images as listed and agreed with the findings in the report. No results found.    No orders of the defined types were placed in this encounter.  All questions were answered. The patient knows to call the clinic with any problems, questions or concerns. No barriers to learning was detected. The total time spent in the appointment was 20 minutes, including review of chart and various tests results, discussions about plan of care and coordination of care plan     Onita Mattock, MD 10/12/2023

## 2023-10-12 NOTE — Assessment & Plan Note (Signed)
invasive ductal carcinoma, pT2N0, M0), Stage 1B, ER/PR: positive, HER2 negative, Grade 3, Oncotype RS 21, recurrence in 07/2022 -diagnosed 10/2016, s/p bilateral mastectomy and reconstruction. Based on oncotype RS she did not need adjuvant chemotherapy. And given node-negative mastectomy she did not require adjuvant radiation -started adjuvant letrozole 12/2016. She was changed to exemestane 11/2019 due to arthralgia.  Planned for a total of 7 years but stopped 12/2021 due to worsening bone density.  -Recent US/bx showed recurrent vs new R breast IDC, G2, ER/PR strongly positive, HER2 negative, with Ki67 20%.  Staging CT and bone scan showed no definitive evidence of metastasis. -She underwent right lumpectomy on August 18, 2022, no residual malignant cell was seen on surgical path.  Her initial biopsy showed 1.5 mm invasive cancer, the cancer was removed completely by biopsy. -Given the small size of invasive cancer, no benefit of adjuvant chemotherapy. -She previously received a total of 5 years of AI.  She stopped in November 2023 due to osteoprosis -I recommended 5 years of tamoxifen, she started in 08/2022 -She completed adjuvant RT on 11/26/2022

## 2023-11-16 ENCOUNTER — Ambulatory Visit (HOSPITAL_BASED_OUTPATIENT_CLINIC_OR_DEPARTMENT_OTHER)
Admission: RE | Admit: 2023-11-16 | Discharge: 2023-11-16 | Disposition: A | Discharge status: Home / Self Care | Source: Ambulatory Visit | Attending: Hematology | Admitting: Hematology

## 2023-11-16 DIAGNOSIS — M85852 Other specified disorders of bone density and structure, left thigh: Secondary | ICD-10-CM | POA: Diagnosis not present

## 2023-11-16 DIAGNOSIS — E2839 Other primary ovarian failure: Secondary | ICD-10-CM | POA: Diagnosis not present

## 2023-11-16 DIAGNOSIS — Z780 Asymptomatic menopausal state: Secondary | ICD-10-CM | POA: Diagnosis not present

## 2023-11-16 DIAGNOSIS — M85851 Other specified disorders of bone density and structure, right thigh: Secondary | ICD-10-CM | POA: Diagnosis not present

## 2023-11-19 ENCOUNTER — Encounter: Payer: Self-pay | Admitting: Hematology

## 2023-11-23 ENCOUNTER — Other Ambulatory Visit: Payer: Self-pay | Admitting: Hematology

## 2023-12-02 ENCOUNTER — Other Ambulatory Visit

## 2023-12-28 DIAGNOSIS — H43393 Other vitreous opacities, bilateral: Secondary | ICD-10-CM | POA: Diagnosis not present

## 2024-01-11 ENCOUNTER — Ambulatory Visit (HOSPITAL_BASED_OUTPATIENT_CLINIC_OR_DEPARTMENT_OTHER)
Admission: RE | Admit: 2024-01-11 | Discharge: 2024-01-11 | Disposition: A | Source: Ambulatory Visit | Attending: Family Medicine | Admitting: Family Medicine

## 2024-01-11 DIAGNOSIS — E042 Nontoxic multinodular goiter: Secondary | ICD-10-CM | POA: Diagnosis not present

## 2024-01-12 ENCOUNTER — Ambulatory Visit: Payer: Self-pay | Admitting: Family Medicine

## 2024-01-12 DIAGNOSIS — E041 Nontoxic single thyroid nodule: Secondary | ICD-10-CM | POA: Insufficient documentation

## 2024-01-12 DIAGNOSIS — E042 Nontoxic multinodular goiter: Secondary | ICD-10-CM

## 2024-01-12 DIAGNOSIS — R9389 Abnormal findings on diagnostic imaging of other specified body structures: Secondary | ICD-10-CM | POA: Insufficient documentation

## 2024-01-12 DIAGNOSIS — Z170 Estrogen receptor positive status [ER+]: Secondary | ICD-10-CM

## 2024-01-12 NOTE — Telephone Encounter (Signed)
 Repeat 2-year thyroid  ultrasound resulted with changes warranting fine-needle biopsy in one of the thyroid  nodules present.  Nodules: #1 right superior thyroid  lobe-fine-needle aspiration/biopsy ordered 01/12/2024 [facility] point #5,6 isthmus and left superior thyroid  lobe-1 year follow-up #2,3,4,7,8 - not significantly changed 5 years and most compatible with a benign nodule.   Patient was called and made aware of results and needle biopsy order

## 2024-01-24 ENCOUNTER — Ambulatory Visit
Admission: RE | Admit: 2024-01-24 | Discharge: 2024-01-24 | Disposition: A | Discharge status: Home / Self Care | Source: Ambulatory Visit | Attending: Family Medicine | Admitting: Family Medicine

## 2024-01-24 VITALS — BP 156/108 | HR 86 | Temp 98.9°F | Resp 18 | Ht 64.0 in | Wt 140.0 lb

## 2024-01-24 DIAGNOSIS — J309 Allergic rhinitis, unspecified: Secondary | ICD-10-CM | POA: Diagnosis not present

## 2024-01-24 DIAGNOSIS — J0100 Acute maxillary sinusitis, unspecified: Secondary | ICD-10-CM | POA: Diagnosis not present

## 2024-01-24 DIAGNOSIS — R059 Cough, unspecified: Secondary | ICD-10-CM | POA: Diagnosis not present

## 2024-01-24 LAB — POCT INFLUENZA A/B
Influenza A, POC: NEGATIVE
Influenza B, POC: NEGATIVE

## 2024-01-24 LAB — POC SOFIA SARS ANTIGEN FIA: SARS Coronavirus 2 Ag: NEGATIVE

## 2024-01-24 MED ORDER — AMOXICILLIN-POT CLAVULANATE 875-125 MG PO TABS: 1.0000 | ORAL_TABLET | Freq: Two times a day (BID) | ORAL | 0 refills | Status: AC

## 2024-01-24 MED ORDER — PREDNISONE 20 MG PO TABS: ORAL_TABLET | ORAL | 0 refills | Status: AC

## 2024-01-24 MED ORDER — FEXOFENADINE HCL 180 MG PO TABS: 180.0000 mg | ORAL_TABLET | Freq: Every day | ORAL | 0 refills | Status: AC

## 2024-01-24 NOTE — ED Provider Notes (Signed)
 " [facility]    CSN: [id number] Arrival date & time: 01/24/24  1143      History   Chief Complaint Chief Complaint  Patient presents with   Cough    Congestion and cough - Entered by patient    HPI BRANDI TOMLINSON is a 63 y.o. female.   HPI Pleasant 63 year old female presents with cough and congestion for 3 days.  Patient reports recent air travel to [place].  PMH FM for right breast cancer, HTN, and cervical radiculopathy.  Past Medical History:  Diagnosis Date   Adjustment insomnia 02/23/2019   Breast cancer (HCC) 2018   Lumpectomy, chemotherapy and radiation with recurrence 2018. Now on Femara    Cervical radiculopathy 07/05/2019   COVID-19 02/2019   Her husband also passed away at this time from COVID-19.   Essential hypertension 08/10/2020   Family history of breast cancer    Fracture of vertebra due to osteoporosis (HCC) 11/27/2021   Genetic testing 10/30/2016   Breast/GYN panel (23 genes) @ [facility] - No pathogenic mutations detected   GERD (gastroesophageal reflux disease)    OTC medication   Grief reaction 02/23/2019   Heart murmur    as a child - Echo in early 20s ok   History of echocardiogram 05/2015   Echo 4/17: EF 55-60%, GLS -19.6%, normal wall motion   History of exercise stress test 05/2015   ETT 4/17 - No ST changes   HLD (hyperlipidemia)    Hypothyroidism    Mixed emotional features as adjustment reaction 11/09/2015   Multiple thyroid  nodules 2009   Multiple nodules: Normal ultrasound reported at diagnosis   Nondisplaced fracture of triquetral bone of right wrist with routine healing 11/27/2021   Osteoporosis    Personal history of chemotherapy    Personal history of radiation therapy    UNEQUAL LEG LENGTH, ACQUIRED 11/17/2006   Qualifier: Diagnosis of  By: HARVEY MD, KARL     Vasovagal syncope 11/27/2021    Patient Active Problem List   Diagnosis Date Noted   Right thyroid  nodule 01/12/2024   Abnormal thyroid  ultrasound  01/12/2024   On statin therapy 05/23/2021   Mixed hyperlipidemia 05/23/2021   Status post bilateral breast reconstruction 06/10/2019   Arthritis 04/22/2018   Acquired absence of bilateral breasts and nipples 11/24/2016   Family history of breast cancer    Malignant neoplasm of upper-inner quadrant of right breast in female, estrogen receptor positive (HCC) 10/14/2016   Multiple thyroid  nodules 12/26/2014   Osteoporosis 12/26/2014    Past Surgical History:  Procedure Laterality Date   ABDOMINAL HYSTERECTOMY     BREAST BIOPSY Right 07/01/2022   US  RT BREAST BX W LOC DEV 1ST LESION IMG BX SPEC US  GUIDE 07/01/2022 [facility]   BREAST BIOPSY  08/15/2022   US  RT RADIOACTIVE SEED LOC 08/15/2022 [facility]   BREAST BIOPSY Right 10/02/2022   US  RT BREAST BX W LOC DEV 1ST LESION IMG BX SPEC US  GUIDE 10/02/2022 [facility]   BREAST LUMPECTOMY Right 2003   BREAST LUMPECTOMY WITH RADIOACTIVE SEED LOCALIZATION Right 08/18/2022   Procedure: RIGHT BREAST LUMPECTOMY WITH RADIOACTIVE SEED LOCALIZATION;  Surgeon: Curvin Deward MOULD, MD;  Location: [facility];  Service: General;  Laterality: Right;   BREAST RECONSTRUCTION WITH PLACEMENT OF TISSUE EXPANDER AND FLEX HD (ACELLULAR HYDRATED DERMIS) Bilateral 11/17/2016   Procedure: IMEDITATE BREAST RECONSTRUCTION WITH PLACEMENT OF TISSUE EXPANDER AND FLEX HD (ACELLULAR HYDRATED DERMIS);  Surgeon: Lowery Estefana RAMAN, DO;  Location: [facility];  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;  Laterality: Bilateral;   BREAST SURGERY  2003 /right   lumpectomy; chemotherapy and radiation   LIPOSUCTION WITH LIPOFILLING Bilateral 02/23/2020   Procedure: Fat filling of breasts for symmetry from abdomen;  Surgeon: Lowery Estefana RAMAN, DO;  Location: [facility];  Service: Plastics;  Laterality: Bilateral;  90 min, please   LIPOSUCTION WITH LIPOFILLING Bilateral 05/16/2021   Procedure: LIPOSUCTION WITH LIPOFILLING;  Surgeon: Lowery Estefana RAMAN, DO;   Location: [facility];  Service: Plastics;  Laterality: Bilateral;  1.5 hours   REMOVAL OF BILATERAL TISSUE EXPANDERS WITH PLACEMENT OF BILATERAL BREAST IMPLANTS Bilateral 02/11/2017   Procedure: REMOVAL OF BILATERAL TISSUE EXPANDERS WITH PLACEMENT OF BILATERAL SILICONE BREAST IMPLANTS;  Surgeon: Lowery Estefana RAMAN, DO;  Location: [facility];  Service: Plastics;  Laterality: Bilateral;   TOTAL ABDOMINAL HYSTERECTOMY W/ BILATERAL SALPINGOOPHORECTOMY     for treatment of breast treatment   TOTAL MASTECTOMY Bilateral 11/17/2016   Procedure: RIGHT MASTECTOMY AND LEFT PROPHYLATIC MASTECTOMY;  Surgeon: Curvin Deward MOULD, MD;  Location: [facility];  Service: General;  Laterality: Bilateral;    OB History   No obstetric history on file.      Home Medications    Prior to Admission medications  Medication Sig Start Date End Date Taking? Authorizing Provider  amoxicillin -clavulanate (AUGMENTIN ) 875-125 MG tablet Take 1 tablet by mouth every 12 (twelve) hours. 01/24/24  Yes Teddy Sharper, FNP  atorvastatin  (LIPITOR) 20 MG tablet Take 1 tablet (20 mg total) by mouth daily. 05/27/23  Yes Kuneff, Renee A, DO  Calcium  500 MG CHEW Chew by mouth.   Yes [provider]  Collagenase POWD by Does not apply route.   Yes [provider]  diclofenac  (VOLTAREN ) 75 MG EC tablet Take 1 tablet (75 mg total) by mouth daily. 05/27/23  Yes Kuneff, Renee A, DO  ELDERBERRY PO Take by mouth.   Yes [provider]  fexofenadine  (ALLEGRA  ALLERGY) 180 MG tablet Take 1 tablet (180 mg total) by mouth daily. 01/24/24 02/08/24 Yes Teddy Sharper, FNP  predniSONE  (DELTASONE ) 20 MG tablet Take 3 tabs PO daily x 5 days. 01/24/24  Yes Teddy Sharper, FNP  psyllium (METAMUCIL) 58.6 % powder Take 1 packet by mouth daily.   Yes [provider]  tamoxifen  (NOLVADEX ) 20 MG tablet TAKE 1 TABLET BY MOUTH EVERY DAY 11/23/23  Yes Lanny Callander, MD  Vitamin D , Cholecalciferol, 25 MCG (1000  UT) CAPS Take 1 capsule by mouth daily.   Yes [provider]    Family History Family History  Problem Relation Age of Onset   Coronary artery disease Mother        1ST DEGREE RELATIVE   Heart attack Mother 55   Skin cancer Mother        unk type   Diabetes Father    Kidney disease Father    Prostate cancer Father 47       deceased 88   Cancer Brother 30       multiple myeloma    Lung cancer Maternal Uncle        x2 mat uncles; dx 15s-70s   Breast cancer Paternal Aunt 61   Breast cancer Cousin        x2 pat female cousins; dx 55s   Esophageal cancer Cousin 48       pat female cousin   Diabetes Other        1ST DEGREE RELATIVE   Colon cancer Neg Hx  Colon polyps Neg Hx    Stomach cancer Neg Hx    Rectal cancer Neg Hx     Social History Social History[1]   Allergies   Patient has no known allergies.   Review of Systems Review of Systems  HENT:  Positive for congestion.   Respiratory:  Positive for cough.   All other systems reviewed and are negative.    Physical Exam Triage Vital Signs ED Triage Vitals  Encounter Vitals Group     BP      Girls Systolic BP Percentile      Girls Diastolic BP Percentile      Boys Systolic BP Percentile      Boys Diastolic BP Percentile      Pulse      Resp      Temp      Temp src      SpO2      Weight      Height      Head Circumference      Peak Flow      Pain Score      Pain Loc      Pain Education      Exclude from Growth Chart    No data found.  Updated Vital Signs BP (!) 156/108 (BP Location: Left Arm)   Pulse 86   Temp 98.9 F (37.2 C) (Oral)   Resp 18   Ht 5' 4 (1.626 m)   Wt 140 lb (63.5 kg)   SpO2 98%   BMI 24.03 kg/m   Visual Acuity Right Eye Distance:   Left Eye Distance:   Bilateral Distance:    Right Eye Near:   Left Eye Near:    Bilateral Near:     Physical Exam Vitals and nursing note reviewed.  Constitutional:      Appearance: Normal appearance. She is normal  weight. She is ill-appearing.  HENT:     Head: Normocephalic and atraumatic.     Right Ear: Tympanic membrane and external ear normal.     Left Ear: Tympanic membrane and external ear normal.     Ears:     Comments: Moderate eustachian tube dysfunction noted bilaterally    Nose:     Comments: Tubinates are erythematous/edematous    Mouth/Throat:     Mouth: Mucous membranes are moist.     Pharynx: Oropharynx is clear.     Comments: Significant amount of clear drainage of posterior oropharynx noted Eyes:     Extraocular Movements: Extraocular movements intact.     Conjunctiva/sclera: Conjunctivae normal.     Pupils: Pupils are equal, round, and reactive to light.  Cardiovascular:     Rate and Rhythm: Normal rate and regular rhythm.  Pulmonary:     Effort: Pulmonary effort is normal.     Breath sounds: Normal breath sounds. No wheezing, rhonchi or rales.     Comments: Infrequent nonproductive cough on exam Musculoskeletal:        General: Normal range of motion.  Skin:    General: Skin is warm and dry.  Neurological:     General: No focal deficit present.     Mental Status: She is alert and oriented to person, place, and time. Mental status is at baseline.  Psychiatric:        Mood and Affect: Mood normal.        Behavior: Behavior normal.      UC Treatments / Results  Labs (all labs ordered are listed, but only  abnormal results are displayed) Labs Reviewed  POC SOFIA SARS ANTIGEN FIA - Normal  POCT INFLUENZA A/B - Normal    EKG   Radiology No results found.  Procedures Procedures (including critical care time)  Medications Ordered in UC Medications - No data to display  Initial Impression / Assessment and Plan / UC Course  I have reviewed the triage vital signs and the nursing notes.  Pertinent labs & imaging results that were available during my care of the patient were reviewed by me and considered in my medical decision making (see chart for details).      MDM: 1.  Acute nonrecurrent maxillary sinusitis-Rx'd Augmentin  875/125 mg tablet: Take 1 tablet twice daily x 7 days; 2.  Cough, unspecified type-Rx prednisone  20 mg tablet: Take 3 tablets p.o. daily x 5 days; 3.  Allergic rhinitis, unspecified seasonality, unspecified trigger-Allegra  180 mg tablet: Take 1 tablet daily x 5 days, then as needed. Advised patient take medications as directed with food to completion.  Advised to take prednisone  and Allegra  with first dose of Augmentin  for the next 5 of 7 days.  Advised may use Allegra  as needed afterwards for concurrent postnasal drainage/drip/runny nose/sneezing.  Encouraged increase daily water intake to 64 ounces per day while taking these medications.  Advised if symptoms worsen and/or unresolved please follow-up with your PCP or here for further evaluation.  Patient discharged home, hemodynamically stable. Final Clinical Impressions(s) / UC Diagnoses   Final diagnoses:  Cough, unspecified type  Acute non-recurrent maxillary sinusitis  Allergic rhinitis, unspecified seasonality, unspecified trigger     Discharge Instructions      Advised patient take medications as directed with food to completion.  Advised to take prednisone  and Allegra  with first dose of Augmentin  for the next 5 of 7 days.  Advised may use Allegra  as needed afterwards for concurrent postnasal drainage/drip/runny nose/sneezing.  Encouraged increase daily water intake to 64 ounces per day while taking these medications.  Advised if symptoms worsen and/or unresolved please follow-up with your PCP or here for further evaluation.     ED Prescriptions     Medication Sig Dispense Auth. Provider   amoxicillin -clavulanate (AUGMENTIN ) 875-125 MG tablet Take 1 tablet by mouth every 12 (twelve) hours. 14 tablet Breanda Greenlaw, FNP   predniSONE  (DELTASONE ) 20 MG tablet Take 3 tabs PO daily x 5 days. 15 tablet Wilmetta Speiser, FNP   fexofenadine  (ALLEGRA  ALLERGY) 180 MG tablet Take 1  tablet (180 mg total) by mouth daily. 15 tablet Blessings Inglett, FNP      PDMP not reviewed this encounter.    [1]  Social History Tobacco Use   Smoking status: Never   Smokeless tobacco: Never  Vaping Use   Vaping status: Never Used  Substance Use Topics   Alcohol use: Not Currently    Comment: Rare   Drug use: No     Teddy Sharper, FNP 01/24/24 1306  "

## 2024-01-24 NOTE — ED Triage Notes (Signed)
 Patient c/o cough and congestion x 3 days.  Patient did recently travel to [place] and returned on Friday night.  Afebrile.  Patient has taken Thera-Flu and Mucinex.

## 2024-01-24 NOTE — Discharge Instructions (Addendum)
 Advised patient take medications as directed with food to completion.  Advised to take prednisone  and Allegra  with first dose of Augmentin  for the next 5 of 7 days.  Advised may use Allegra  as needed afterwards for concurrent postnasal drainage/drip/runny nose/sneezing.  Encouraged increase daily water intake to 64 ounces per day while taking these medications.  Advised if symptoms worsen and/or unresolved please follow-up with your PCP or here for further evaluation.

## 2024-02-02 ENCOUNTER — Ambulatory Visit
Admission: RE | Admit: 2024-02-02 | Discharge: 2024-02-02 | Disposition: A | Discharge status: Home / Self Care | Source: Ambulatory Visit | Attending: Family Medicine | Admitting: Family Medicine

## 2024-02-02 ENCOUNTER — Other Ambulatory Visit (HOSPITAL_COMMUNITY)
Admission: RE | Admit: 2024-02-02 | Discharge: 2024-02-02 | Disposition: A | Discharge status: Home / Self Care | Source: Ambulatory Visit | Attending: Family Medicine | Admitting: Family Medicine

## 2024-02-02 DIAGNOSIS — E042 Nontoxic multinodular goiter: Secondary | ICD-10-CM | POA: Insufficient documentation

## 2024-02-02 DIAGNOSIS — R9389 Abnormal findings on diagnostic imaging of other specified body structures: Secondary | ICD-10-CM

## 2024-02-02 DIAGNOSIS — E041 Nontoxic single thyroid nodule: Secondary | ICD-10-CM

## 2024-02-02 DIAGNOSIS — Z170 Estrogen receptor positive status [ER+]: Secondary | ICD-10-CM

## 2024-02-06 LAB — CYTOLOGY - NON PAP

## 2024-02-08 DIAGNOSIS — R9389 Abnormal findings on diagnostic imaging of other specified body structures: Secondary | ICD-10-CM

## 2024-02-08 DIAGNOSIS — R899 Unspecified abnormal finding in specimens from other organs, systems and tissues: Secondary | ICD-10-CM

## 2024-02-08 DIAGNOSIS — E042 Nontoxic multinodular goiter: Secondary | ICD-10-CM

## 2024-02-08 DIAGNOSIS — E041 Nontoxic single thyroid nodule: Secondary | ICD-10-CM

## 2024-02-19 ENCOUNTER — Encounter (HOSPITAL_COMMUNITY): Payer: Self-pay

## 2024-02-25 ENCOUNTER — Ambulatory Visit: Payer: Self-pay | Admitting: Family Medicine

## 2024-02-25 DIAGNOSIS — R899 Unspecified abnormal finding in specimens from other organs, systems and tissues: Secondary | ICD-10-CM | POA: Insufficient documentation

## 2024-02-25 DIAGNOSIS — E041 Nontoxic single thyroid nodule: Secondary | ICD-10-CM

## 2024-02-25 DIAGNOSIS — R9389 Abnormal findings on diagnostic imaging of other specified body structures: Secondary | ICD-10-CM

## 2024-02-25 NOTE — Telephone Encounter (Signed)
 Patient was called today and affirma genomic sequencing classifier results discussed of right upper 1.9 cm thyroid  nodule. Ensemble classifier: Suspicious Xpression Atlas: No variant/fusion detected BRAF p.  [id number].  1799T>A: Negative RET/PTC1,RET/PTC3: Not detected MTC: Negative Parathyroid: Negative Risk of thyroid  malignancy~50%, associated neoplasm type follicular neoplasms (FA, NIFTP, FVPTC, FTC)  1.9 cm [place] 3 nodule A is Afirma GSC suspicious which suggests risk of cancer of approximately 50%.   The risk of malignancy of the GSC suspicious of from a Xa negative sample remains approximately 50%.   clinical correlation and surgical resection should be considered.    Patient has established with [facility], appointment last week.  They are awaiting these results.  Patient will call office and let them know results are not, if they are needing a hardcopy we can fax to them, results are uploaded to the media tab.  Referral placed to [facility] for discussion on surgical resection. Referral placed to her oncology team (breast cancer), Dr.Feng, which may need to weigh in on further plan

## 2024-03-11 ENCOUNTER — Encounter: Payer: Self-pay | Admitting: Hematology

## 2024-04-04 ENCOUNTER — Other Ambulatory Visit

## 2024-04-04 ENCOUNTER — Ambulatory Visit: Admitting: Nurse Practitioner

## 2024-05-27 ENCOUNTER — Encounter: Admitting: Family Medicine
